# Patient Record
Sex: Male | Born: 1955 | Race: White | Hispanic: No | Marital: Single | Smoking: Never smoker
Health system: Southern US, Community
[De-identification: ages and names within clinical notes are randomized; demographics above are authoritative.]

## PROBLEM LIST (undated history)

## (undated) DIAGNOSIS — I5042 Chronic combined systolic (congestive) and diastolic (congestive) heart failure: Secondary | ICD-10-CM

## (undated) DIAGNOSIS — Z95810 Presence of automatic (implantable) cardiac defibrillator: Secondary | ICD-10-CM

## (undated) DIAGNOSIS — E119 Type 2 diabetes mellitus without complications: Secondary | ICD-10-CM

## (undated) DIAGNOSIS — M6281 Muscle weakness (generalized): Secondary | ICD-10-CM

## (undated) DIAGNOSIS — G8929 Other chronic pain: Secondary | ICD-10-CM

## (undated) DIAGNOSIS — E0781 Sick-euthyroid syndrome: Secondary | ICD-10-CM

## (undated) DIAGNOSIS — E785 Hyperlipidemia, unspecified: Secondary | ICD-10-CM

## (undated) DIAGNOSIS — G629 Polyneuropathy, unspecified: Secondary | ICD-10-CM

## (undated) DIAGNOSIS — G4733 Obstructive sleep apnea (adult) (pediatric): Secondary | ICD-10-CM

## (undated) DIAGNOSIS — I255 Ischemic cardiomyopathy: Secondary | ICD-10-CM

## (undated) DIAGNOSIS — I7090 Unspecified atherosclerosis: Secondary | ICD-10-CM

## (undated) DIAGNOSIS — G2581 Restless legs syndrome: Secondary | ICD-10-CM

## (undated) DIAGNOSIS — R079 Chest pain, unspecified: Secondary | ICD-10-CM

## (undated) DIAGNOSIS — I10 Essential (primary) hypertension: Secondary | ICD-10-CM

## (undated) DIAGNOSIS — I2510 Atherosclerotic heart disease of native coronary artery without angina pectoris: Secondary | ICD-10-CM

## (undated) DIAGNOSIS — K8050 Calculus of bile duct without cholangitis or cholecystitis without obstruction: Secondary | ICD-10-CM

## (undated) DIAGNOSIS — N289 Disorder of kidney and ureter, unspecified: Secondary | ICD-10-CM

## (undated) DIAGNOSIS — F339 Major depressive disorder, recurrent, unspecified: Secondary | ICD-10-CM

## (undated) DIAGNOSIS — I4891 Unspecified atrial fibrillation: Secondary | ICD-10-CM

---

## 2016-12-10 ENCOUNTER — Encounter (HOSPITAL_COMMUNITY): Payer: Self-pay | Admitting: Emergency Medicine

## 2016-12-10 ENCOUNTER — Inpatient Hospital Stay (HOSPITAL_COMMUNITY)
Admission: EM | Admit: 2016-12-10 | Discharge: 2016-12-31 | DRG: 291 | Disposition: A | Discharge status: Skilled Nursing Facility | Payer: Medicaid Other | Attending: Internal Medicine | Admitting: Internal Medicine

## 2016-12-10 DIAGNOSIS — E1165 Type 2 diabetes mellitus with hyperglycemia: Secondary | ICD-10-CM | POA: Diagnosis present

## 2016-12-10 DIAGNOSIS — I447 Left bundle-branch block, unspecified: Secondary | ICD-10-CM | POA: Diagnosis present

## 2016-12-10 DIAGNOSIS — K625 Hemorrhage of anus and rectum: Secondary | ICD-10-CM

## 2016-12-10 DIAGNOSIS — E875 Hyperkalemia: Secondary | ICD-10-CM | POA: Diagnosis not present

## 2016-12-10 DIAGNOSIS — E662 Morbid (severe) obesity with alveolar hypoventilation: Secondary | ICD-10-CM | POA: Diagnosis present

## 2016-12-10 DIAGNOSIS — K1379 Other lesions of oral mucosa: Secondary | ICD-10-CM

## 2016-12-10 DIAGNOSIS — K648 Other hemorrhoids: Secondary | ICD-10-CM | POA: Diagnosis present

## 2016-12-10 DIAGNOSIS — Z9911 Dependence on respirator [ventilator] status: Secondary | ICD-10-CM

## 2016-12-10 DIAGNOSIS — N172 Acute kidney failure with medullary necrosis: Secondary | ICD-10-CM | POA: Diagnosis present

## 2016-12-10 DIAGNOSIS — I5023 Acute on chronic systolic (congestive) heart failure: Secondary | ICD-10-CM | POA: Diagnosis present

## 2016-12-10 DIAGNOSIS — W19XXXA Unspecified fall, initial encounter: Secondary | ICD-10-CM | POA: Diagnosis not present

## 2016-12-10 DIAGNOSIS — Z23 Encounter for immunization: Secondary | ICD-10-CM

## 2016-12-10 DIAGNOSIS — F23 Brief psychotic disorder: Secondary | ICD-10-CM | POA: Diagnosis present

## 2016-12-10 DIAGNOSIS — D125 Benign neoplasm of sigmoid colon: Secondary | ICD-10-CM | POA: Diagnosis present

## 2016-12-10 DIAGNOSIS — Y92239 Unspecified place in hospital as the place of occurrence of the external cause: Secondary | ICD-10-CM | POA: Diagnosis not present

## 2016-12-10 DIAGNOSIS — E1142 Type 2 diabetes mellitus with diabetic polyneuropathy: Secondary | ICD-10-CM | POA: Diagnosis present

## 2016-12-10 DIAGNOSIS — L03116 Cellulitis of left lower limb: Secondary | ICD-10-CM | POA: Diagnosis present

## 2016-12-10 DIAGNOSIS — Z7982 Long term (current) use of aspirin: Secondary | ICD-10-CM

## 2016-12-10 DIAGNOSIS — I4892 Unspecified atrial flutter: Secondary | ICD-10-CM | POA: Diagnosis present

## 2016-12-10 DIAGNOSIS — K635 Polyp of colon: Secondary | ICD-10-CM

## 2016-12-10 DIAGNOSIS — R51 Headache: Secondary | ICD-10-CM | POA: Diagnosis not present

## 2016-12-10 DIAGNOSIS — Z794 Long term (current) use of insulin: Secondary | ICD-10-CM

## 2016-12-10 DIAGNOSIS — R0600 Dyspnea, unspecified: Secondary | ICD-10-CM

## 2016-12-10 DIAGNOSIS — I420 Dilated cardiomyopathy: Secondary | ICD-10-CM | POA: Diagnosis present

## 2016-12-10 DIAGNOSIS — I481 Persistent atrial fibrillation: Secondary | ICD-10-CM | POA: Diagnosis present

## 2016-12-10 DIAGNOSIS — I4891 Unspecified atrial fibrillation: Secondary | ICD-10-CM | POA: Diagnosis present

## 2016-12-10 DIAGNOSIS — S01512A Laceration without foreign body of oral cavity, initial encounter: Secondary | ICD-10-CM | POA: Diagnosis present

## 2016-12-10 DIAGNOSIS — G4733 Obstructive sleep apnea (adult) (pediatric): Secondary | ICD-10-CM | POA: Diagnosis present

## 2016-12-10 DIAGNOSIS — L03115 Cellulitis of right lower limb: Secondary | ICD-10-CM | POA: Diagnosis present

## 2016-12-10 DIAGNOSIS — F333 Major depressive disorder, recurrent, severe with psychotic symptoms: Secondary | ICD-10-CM | POA: Diagnosis present

## 2016-12-10 DIAGNOSIS — I255 Ischemic cardiomyopathy: Secondary | ICD-10-CM | POA: Diagnosis present

## 2016-12-10 DIAGNOSIS — E0781 Sick-euthyroid syndrome: Secondary | ICD-10-CM | POA: Diagnosis present

## 2016-12-10 DIAGNOSIS — E870 Hyperosmolality and hypernatremia: Secondary | ICD-10-CM | POA: Diagnosis present

## 2016-12-10 DIAGNOSIS — I509 Heart failure, unspecified: Secondary | ICD-10-CM

## 2016-12-10 DIAGNOSIS — N183 Chronic kidney disease, stage 3 unspecified: Secondary | ICD-10-CM

## 2016-12-10 DIAGNOSIS — I10 Essential (primary) hypertension: Secondary | ICD-10-CM | POA: Diagnosis present

## 2016-12-10 DIAGNOSIS — I482 Chronic atrial fibrillation, unspecified: Secondary | ICD-10-CM

## 2016-12-10 DIAGNOSIS — I5021 Acute systolic (congestive) heart failure: Secondary | ICD-10-CM

## 2016-12-10 DIAGNOSIS — J9602 Acute respiratory failure with hypercapnia: Secondary | ICD-10-CM | POA: Diagnosis present

## 2016-12-10 DIAGNOSIS — J9601 Acute respiratory failure with hypoxia: Secondary | ICD-10-CM | POA: Diagnosis present

## 2016-12-10 DIAGNOSIS — Z951 Presence of aortocoronary bypass graft: Secondary | ICD-10-CM

## 2016-12-10 DIAGNOSIS — E785 Hyperlipidemia, unspecified: Secondary | ICD-10-CM | POA: Diagnosis present

## 2016-12-10 DIAGNOSIS — R440 Auditory hallucinations: Secondary | ICD-10-CM

## 2016-12-10 DIAGNOSIS — N189 Chronic kidney disease, unspecified: Secondary | ICD-10-CM

## 2016-12-10 DIAGNOSIS — E876 Hypokalemia: Secondary | ICD-10-CM | POA: Diagnosis present

## 2016-12-10 DIAGNOSIS — Z9981 Dependence on supplemental oxygen: Secondary | ICD-10-CM

## 2016-12-10 DIAGNOSIS — E1122 Type 2 diabetes mellitus with diabetic chronic kidney disease: Secondary | ICD-10-CM | POA: Diagnosis present

## 2016-12-10 DIAGNOSIS — E7220 Disorder of urea cycle metabolism, unspecified: Secondary | ICD-10-CM | POA: Diagnosis present

## 2016-12-10 DIAGNOSIS — I130 Hypertensive heart and chronic kidney disease with heart failure and stage 1 through stage 4 chronic kidney disease, or unspecified chronic kidney disease: Principal | ICD-10-CM | POA: Diagnosis present

## 2016-12-10 DIAGNOSIS — S0003XA Contusion of scalp, initial encounter: Secondary | ICD-10-CM | POA: Diagnosis not present

## 2016-12-10 DIAGNOSIS — N179 Acute kidney failure, unspecified: Secondary | ICD-10-CM

## 2016-12-10 DIAGNOSIS — K921 Melena: Secondary | ICD-10-CM | POA: Diagnosis not present

## 2016-12-10 DIAGNOSIS — Z8249 Family history of ischemic heart disease and other diseases of the circulatory system: Secondary | ICD-10-CM

## 2016-12-10 DIAGNOSIS — R42 Dizziness and giddiness: Secondary | ICD-10-CM | POA: Diagnosis not present

## 2016-12-10 DIAGNOSIS — E872 Acidosis: Secondary | ICD-10-CM | POA: Diagnosis present

## 2016-12-10 DIAGNOSIS — Z7901 Long term (current) use of anticoagulants: Secondary | ICD-10-CM

## 2016-12-10 DIAGNOSIS — Z6841 Body Mass Index (BMI) 40.0 and over, adult: Secondary | ICD-10-CM

## 2016-12-10 DIAGNOSIS — Z95810 Presence of automatic (implantable) cardiac defibrillator: Secondary | ICD-10-CM

## 2016-12-10 DIAGNOSIS — E119 Type 2 diabetes mellitus without complications: Secondary | ICD-10-CM

## 2016-12-10 DIAGNOSIS — Z79899 Other long term (current) drug therapy: Secondary | ICD-10-CM

## 2016-12-10 DIAGNOSIS — I2510 Atherosclerotic heart disease of native coronary artery without angina pectoris: Secondary | ICD-10-CM | POA: Diagnosis present

## 2016-12-10 DIAGNOSIS — X58XXXA Exposure to other specified factors, initial encounter: Secondary | ICD-10-CM | POA: Diagnosis present

## 2016-12-10 DIAGNOSIS — G92 Toxic encephalopathy: Secondary | ICD-10-CM | POA: Diagnosis present

## 2016-12-10 DIAGNOSIS — Z955 Presence of coronary angioplasty implant and graft: Secondary | ICD-10-CM

## 2016-12-10 DIAGNOSIS — G2581 Restless legs syndrome: Secondary | ICD-10-CM | POA: Diagnosis present

## 2016-12-10 DIAGNOSIS — K5900 Constipation, unspecified: Secondary | ICD-10-CM | POA: Diagnosis not present

## 2016-12-10 DIAGNOSIS — T4275XA Adverse effect of unspecified antiepileptic and sedative-hypnotic drugs, initial encounter: Secondary | ICD-10-CM | POA: Diagnosis present

## 2016-12-10 DIAGNOSIS — R441 Visual hallucinations: Secondary | ICD-10-CM

## 2016-12-10 DIAGNOSIS — D62 Acute posthemorrhagic anemia: Secondary | ICD-10-CM | POA: Diagnosis present

## 2016-12-10 HISTORY — DX: Presence of automatic (implantable) cardiac defibrillator: Z95.810

## 2016-12-10 HISTORY — DX: Unspecified atrial fibrillation: I48.91

## 2016-12-10 HISTORY — DX: Chronic combined systolic (congestive) and diastolic (congestive) heart failure: I50.42

## 2016-12-10 HISTORY — DX: Essential (primary) hypertension: I10

## 2016-12-10 HISTORY — DX: Hyperlipidemia, unspecified: E78.5

## 2016-12-10 HISTORY — DX: Type 2 diabetes mellitus without complications: E11.9

## 2016-12-10 HISTORY — DX: Ischemic cardiomyopathy: I25.5

## 2016-12-10 HISTORY — DX: Muscle weakness (generalized): M62.81

## 2016-12-10 HISTORY — DX: Atherosclerotic heart disease of native coronary artery without angina pectoris: I25.10

## 2016-12-10 HISTORY — DX: Major depressive disorder, recurrent, unspecified: F33.9

## 2016-12-10 HISTORY — DX: Morbid (severe) obesity due to excess calories: E66.01

## 2016-12-10 HISTORY — DX: Other chronic pain: G89.29

## 2016-12-10 HISTORY — DX: Calculus of bile duct without cholangitis or cholecystitis without obstruction: K80.50

## 2016-12-10 HISTORY — DX: Chest pain, unspecified: R07.9

## 2016-12-10 HISTORY — DX: Obstructive sleep apnea (adult) (pediatric): G47.33

## 2016-12-10 HISTORY — DX: Sick-euthyroid syndrome: E07.81

## 2016-12-10 HISTORY — DX: Restless legs syndrome: G25.81

## 2016-12-10 HISTORY — DX: Polyneuropathy, unspecified: G62.9

## 2016-12-10 HISTORY — DX: Disorder of kidney and ureter, unspecified: N28.9

## 2016-12-10 HISTORY — DX: Unspecified atherosclerosis: I70.90

## 2016-12-10 LAB — ETHANOL: Alcohol, Ethyl (B): <10 mg/dL (ref <10)

## 2016-12-10 LAB — RAPID URINE DRUG SCREEN, HOSP PERFORMED
AMPHETAMINES: NOT DETECTED
BARBITURATES: NOT DETECTED
BENZODIAZEPINES: NOT DETECTED
Cocaine: NOT DETECTED
Opiates: NOT DETECTED
TETRAHYDROCANNABINOL: NOT DETECTED

## 2016-12-10 LAB — CBC WITH DIFFERENTIAL/PLATELET
BASOS PCT: 1 %
Basophils Absolute: 0 10*3/uL (ref 0.0–0.1)
EOS ABS: 0.2 10*3/uL (ref 0.0–0.7)
EOS PCT: 3 %
HCT: 31.5 % — ABNORMAL LOW (ref 39.0–52.0)
Hemoglobin: 9.7 g/dL — ABNORMAL LOW (ref 13.0–17.0)
LYMPHS ABS: 0.7 10*3/uL (ref 0.7–4.0)
Lymphocytes Relative: 10 %
MCH: 29.4 pg (ref 26.0–34.0)
MCHC: 30.8 g/dL (ref 30.0–36.0)
MCV: 95.5 fL (ref 78.0–100.0)
Monocytes Absolute: 0.6 10*3/uL (ref 0.1–1.0)
Monocytes Relative: 10 %
Neutro Abs: 4.9 10*3/uL (ref 1.7–7.7)
Neutrophils Relative %: 76 %
PLATELETS: 234 10*3/uL (ref 150–400)
RBC: 3.3 MIL/uL — AB (ref 4.22–5.81)
RDW: 14.9 % (ref 11.5–15.5)
WBC: 6.4 10*3/uL (ref 4.0–10.5)

## 2016-12-10 LAB — BASIC METABOLIC PANEL
Anion gap: 12 (ref 5–15)
BUN: 34 mg/dL — AB (ref 6–20)
CO2: 37 mmol/L — ABNORMAL HIGH (ref 22–32)
CREATININE: 2.01 mg/dL — AB (ref 0.61–1.24)
Calcium: 8.6 mg/dL — ABNORMAL LOW (ref 8.9–10.3)
Chloride: 88 mmol/L — ABNORMAL LOW (ref 101–111)
GFR calc non Af Amer: 34 mL/min — ABNORMAL LOW (ref >60)
GFR, EST AFRICAN AMERICAN: 39 mL/min — AB (ref >60)
Glucose, Bld: 114 mg/dL — ABNORMAL HIGH (ref 65–99)
Potassium: 3.7 mmol/L (ref 3.5–5.1)
SODIUM: 137 mmol/L (ref 135–145)

## 2016-12-10 MED ORDER — ASPIRIN EC 81 MG PO TBEC
81.0000 mg | DELAYED_RELEASE_TABLET | Freq: Every day | ORAL | Status: DC
  Administered 2016-12-11: 81 mg via ORAL
  Filled 2016-12-10: qty 1

## 2016-12-10 MED ORDER — ISOSORBIDE MONONITRATE ER 60 MG PO TB24
60.0000 mg | ORAL_TABLET | Freq: Every day | ORAL | Status: DC
  Administered 2016-12-11 – 2016-12-14 (×4): 60 mg via ORAL
  Filled 2016-12-10 (×6): qty 1

## 2016-12-10 MED ORDER — AMIODARONE HCL 200 MG PO TABS
200.0000 mg | ORAL_TABLET | Freq: Every day | ORAL | Status: DC
  Administered 2016-12-11 – 2016-12-14 (×4): 200 mg via ORAL
  Filled 2016-12-10 (×5): qty 1

## 2016-12-10 MED ORDER — BUMETANIDE 1 MG PO TABS
2.0000 mg | ORAL_TABLET | Freq: Two times a day (BID) | ORAL | Status: DC
  Administered 2016-12-11 – 2016-12-14 (×7): 2 mg via ORAL
  Filled 2016-12-10 (×3): qty 2
  Filled 2016-12-10 (×2): qty 1
  Filled 2016-12-10 (×3): qty 2
  Filled 2016-12-10: qty 1

## 2016-12-10 MED ORDER — OXYCODONE HCL 5 MG PO CAPS
5.0000 mg | ORAL_CAPSULE | ORAL | Status: DC | PRN
  Filled 2016-12-10: qty 1

## 2016-12-10 MED ORDER — APIXABAN 5 MG PO TABS
5.0000 mg | ORAL_TABLET | Freq: Two times a day (BID) | ORAL | Status: DC
  Administered 2016-12-11 (×2): 5 mg via ORAL
  Filled 2016-12-10 (×3): qty 1

## 2016-12-10 MED ORDER — SPIRONOLACTONE 25 MG PO TABS
12.5000 mg | ORAL_TABLET | Freq: Every day | ORAL | Status: DC
  Administered 2016-12-11 – 2016-12-14 (×4): 12.5 mg via ORAL
  Filled 2016-12-10 (×5): qty 1

## 2016-12-10 MED ORDER — CARVEDILOL 3.125 MG PO TABS
3.1250 mg | ORAL_TABLET | Freq: Two times a day (BID) | ORAL | Status: DC
  Administered 2016-12-11 – 2016-12-14 (×6): 3.125 mg via ORAL
  Filled 2016-12-10 (×10): qty 1

## 2016-12-10 MED ORDER — ATORVASTATIN CALCIUM 40 MG PO TABS
80.0000 mg | ORAL_TABLET | Freq: Every evening | ORAL | Status: DC
  Administered 2016-12-11 – 2016-12-13 (×3): 80 mg via ORAL
  Filled 2016-12-10 (×2): qty 2
  Filled 2016-12-10: qty 1

## 2016-12-10 MED ORDER — DOCUSATE SODIUM 100 MG PO CAPS
100.0000 mg | ORAL_CAPSULE | Freq: Every day | ORAL | Status: DC
  Administered 2016-12-11 – 2016-12-14 (×3): 100 mg via ORAL
  Filled 2016-12-10 (×5): qty 1

## 2016-12-10 MED ORDER — GABAPENTIN 100 MG PO CAPS
100.0000 mg | ORAL_CAPSULE | Freq: Three times a day (TID) | ORAL | Status: DC
  Administered 2016-12-11: 100 mg via ORAL
  Filled 2016-12-10: qty 1

## 2016-12-10 MED ORDER — SULFAMETHOXAZOLE-TRIMETHOPRIM 800-160 MG PO TABS
1.0000 | ORAL_TABLET | Freq: Two times a day (BID) | ORAL | Status: DC
  Administered 2016-12-11 (×2): 1 via ORAL
  Filled 2016-12-10 (×2): qty 1

## 2016-12-10 MED ORDER — ROPINIROLE HCL 1 MG PO TABS
1.0000 mg | ORAL_TABLET | Freq: Every day | ORAL | Status: DC
  Administered 2016-12-11 – 2016-12-13 (×3): 1 mg via ORAL
  Filled 2016-12-10 (×8): qty 1

## 2016-12-10 MED ORDER — ACETAMINOPHEN 325 MG PO TABS: 650.0000 mg | ORAL_TABLET | Freq: Four times a day (QID) | ORAL | Status: DC | PRN

## 2016-12-10 MED ORDER — MAGNESIUM OXIDE 400 MG PO TABS
400.0000 mg | ORAL_TABLET | Freq: Every day | ORAL | Status: DC
  Administered 2016-12-11 – 2016-12-14 (×4): 400 mg via ORAL
  Filled 2016-12-10 (×11): qty 1

## 2016-12-10 MED ORDER — TIOTROPIUM BROMIDE MONOHYDRATE 18 MCG IN CAPS
18.0000 ug | ORAL_CAPSULE | Freq: Every day | RESPIRATORY_TRACT | Status: DC
  Administered 2016-12-11: 18 ug via RESPIRATORY_TRACT
  Filled 2016-12-10: qty 5
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
## 2016-12-10 NOTE — ED Notes (Signed)
Pt states he has been having AVH X1 month, stopped taking antidepressant two days ago because he believed that was the cause. He states he does not have HI/SI thoughts but does not feel safe at SNF. Denies command hallucinations, but states he had "sat in front of 3 doctors today and one of them picked up a square floor tile. When she lifted it up there was a bunch of children, it was really loud, and she got a box from them. It was filled with a bunch of star and football pills.".
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
## 2016-12-10 NOTE — ED Notes (Signed)
Family members to contact with [place], Niece [phone]  Loni Dolly [phone]

## 2016-12-10 NOTE — ED Provider Notes (Signed)
[place] DEPT Provider Note   CSN: [id number] Arrival date & time: 12/10/16  2048     History   Chief Complaint Chief Complaint  Patient presents with  . V70.1    HPI Darrell Black is a 61 y.o. male.  Patient is a 61 year old male with multiple medical problems including diabetes, CHF, atrial fibrillation, ischemic cardiomyopathy, major depression, chronic kidney disease presenting today for visual hallucinations which have worsened over the last 4 days. Patient states that since starting an antidepressant in June he started to have some mild visual hallucinations which included spiders crawling on the wall and curtains moving. It progressed to having people sitting in his room but they were not threatening it did not bother him. He recently started on an antibiotic on Friday and discussed with his doctor about stopping his antidepressant. Since Friday symptoms have significantly worsened. Heis hallucinating constantly, sometimes the divisions are threatening and scary.  She states ordinarily hurt anyone but he is afraid if he is having one of these visions he may have a hard time discerning reality from delusion and could potentially hurt someone. He states he would never hurt himself as his brother committed suicide and he would never do that to his family. He denies any fever, chest pain, shortness of breath. He has chronic swelling in the legs but does not feel that it is any different than normal.   The history is provided by the patient.    Past Medical History:  Diagnosis Date  . Atherosclerosis   . Atrial fibrillation ([place])   . 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 Chest pain   . Chronic combined systolic and diastolic heart failure ([place])   . Chronic pain   . Coronary artery disease   . 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 Hyperlipidemia   . Hypertension    essential  . Ischemic cardiomyopathy   . Major depressive  disorder, recurrent ([place])   . Morbid obesity due to excess calories ([place])   . Muscle weakness (generalized)   . Neuropathy   . Obstructive sleep apnea   . Presence of automatic cardioverter/defibrillator (AICD)   . Renal disorder    Chronic kidney disease  . Restless leg syndrome   . Sick-euthyroid syndrome     There are no active problems to display for this patient.   No past surgical history on file.     Home Medications    Prior to Admission medications   Not on File    Family History No family history on file.  Social History Social History  Substance Use Topics  . Smoking status: Not on file  . Smokeless tobacco: Not on file  . Alcohol use Not on file     Allergies   Ampicillin; Clindamycin/lincomycin; and Penicillins   Review of Systems Review of Systems  Cardiovascular: Positive for leg swelling.  All other systems reviewed and are negative.    Physical Exam Updated Vital Signs BP 99/65 (BP Location: Right Arm)   Pulse 77   Temp 98.4 F (36.9 C) (Tympanic)   Resp 17   SpO2 99%   Physical Exam  Constitutional: He is oriented to person, place, and time. He appears well-developed and well-nourished. No distress.  Morbidly obese  HENT:  Head: Normocephalic and atraumatic.  Mouth/Throat: Oropharynx is clear and moist.  Eyes: Pupils are equal, round, and reactive to light. Conjunctivae and EOM are normal.  Neck: Normal range  of motion. Neck supple.  Cardiovascular: Normal rate and intact distal pulses.  An irregularly irregular rhythm present.  No murmur heard. Pulmonary/Chest: Effort normal and breath sounds normal. No respiratory distress. He has no wheezes. He has no rales.  Abdominal: Soft. He exhibits no distension. There is no tenderness. There is no rebound and no guarding.  Musculoskeletal: Normal range of motion. He exhibits edema. He exhibits no tenderness.  2+ pitting edema in bilateral lower extremities, skin changes consistent with  venous stasis and small areas of blistering consistent with fluid overload  Neurological: He is alert and oriented to person, place, and time.  Skin: Skin is warm and dry. No rash noted. No erythema.  Psychiatric: He has a normal mood and affect. His speech is normal and behavior is normal. He is actively hallucinating. He expresses no homicidal and no suicidal ideation.  Nursing note and vitals reviewed.    ED Treatments / Results  Labs (all labs ordered are listed, but only abnormal results are displayed) Labs Reviewed  CBC WITH DIFFERENTIAL/PLATELET - Abnormal; Notable for the following:       Result Value   RBC 3.30 (*)    Hemoglobin 9.7 (*)    HCT 31.5 (*)    All other components within normal limits  BASIC METABOLIC PANEL - Abnormal; Notable for the following:    Chloride 88 (*)    CO2 37 (*)    Glucose, Bld 114 (*)    BUN 34 (*)    Creatinine, Ser 2.01 (*)    Calcium 8.6 (*)    GFR calc non Af Amer 34 (*)    GFR calc Af Amer 39 (*)    All other components within normal limits  ETHANOL  RAPID URINE DRUG SCREEN, HOSP PERFORMED    EKG  EKG Interpretation None       Radiology No results found.  Procedures Procedures (including critical care time)  Medications Ordered in ED Medications - No data to display   Initial Impression / Assessment and Plan / ED Course  I have reviewed the triage vital signs and the nursing notes.  Pertinent labs & imaging results that were available during my care of the patient were reviewed by me and considered in my medical decision making (see chart for details).     Is a pleasant older gentleman presenting with worsening auditory hallucinations and visual hallucinations. Symptoms seem to worsen since abruptly stopping his antidepressant on Friday. This suspicion that this is delirium or based on a medical problem which he does have a lot of. Vital signs are stable patient is otherwise well-appearing. However he speaks as his  hallucinations which are very difficult and now they are starting to scare him. No suicidal or direct homicidal ideation. He is just afraid of what might happen if he is having one of his hallucinations that he could potentially hurt someone because he does not realize what is real.  No prior lab work in our system however patient has a history of chronic kidney disease her creatinine of 2 is probably around his baseline as well asis anemia which is again most likely from chronic kidney disease.he does not take any mind altering medications and denies any substance abuse. Alcohol level is less than 10. Patient denies any urinary symptoms and low suspicion for UTI at this time. Will have TTS consult for further recommendation.  Final Clinical Impressions(s) / ED Diagnoses   Final diagnoses:  Visual hallucination  Auditory hallucination  New Prescriptions New Prescriptions   No medications on file     Blanchie Dessert, MD 12/10/16 2315

## 2016-12-11 ENCOUNTER — Emergency Department (HOSPITAL_COMMUNITY): Payer: Medicaid Other

## 2016-12-11 LAB — BLOOD GAS, ARTERIAL
Acid-Base Excess: 15.9 mmol/L — ABNORMAL HIGH (ref 0.0–2.0)
Bicarbonate: 37.8 mmol/L — ABNORMAL HIGH (ref 20.0–28.0)
Drawn by: 22223
O2 CONTENT: 4 L/min
O2 Saturation: 85.2 %
PO2 ART: 59.7 mmHg — AB (ref 83.0–108.0)
pCO2 arterial: 84.2 mmHg (ref 32.0–48.0)
pH, Arterial: 7.324 — ABNORMAL LOW (ref 7.350–7.450)

## 2016-12-11 LAB — BASIC METABOLIC PANEL
Anion gap: 10 (ref 5–15)
BUN: 36 mg/dL — AB (ref 6–20)
CALCIUM: 8.5 mg/dL — AB (ref 8.9–10.3)
CO2: 40 mmol/L — ABNORMAL HIGH (ref 22–32)
CREATININE: 2.05 mg/dL — AB (ref 0.61–1.24)
Chloride: 89 mmol/L — ABNORMAL LOW (ref 101–111)
GFR, EST AFRICAN AMERICAN: 39 mL/min — AB (ref >60)
GFR, EST NON AFRICAN AMERICAN: 33 mL/min — AB (ref >60)
Glucose, Bld: 115 mg/dL — ABNORMAL HIGH (ref 65–99)
Potassium: 3.7 mmol/L (ref 3.5–5.1)
SODIUM: 139 mmol/L (ref 135–145)

## 2016-12-11 LAB — CBC WITH DIFFERENTIAL/PLATELET
BASOS ABS: 0.1 10*3/uL (ref 0.0–0.1)
BASOS PCT: 1 %
EOS ABS: 0.1 10*3/uL (ref 0.0–0.7)
EOS PCT: 1 %
HCT: 32.4 % — ABNORMAL LOW (ref 39.0–52.0)
Hemoglobin: 9.7 g/dL — ABNORMAL LOW (ref 13.0–17.0)
Lymphocytes Relative: 9 %
Lymphs Abs: 0.5 10*3/uL — ABNORMAL LOW (ref 0.7–4.0)
MCH: 29.3 pg (ref 26.0–34.0)
MCHC: 29.9 g/dL — ABNORMAL LOW (ref 30.0–36.0)
MCV: 97.9 fL (ref 78.0–100.0)
Monocytes Absolute: 0.6 10*3/uL (ref 0.1–1.0)
Monocytes Relative: 11 %
NEUTROS PCT: 78 %
Neutro Abs: 4.7 10*3/uL (ref 1.7–7.7)
PLATELETS: 225 10*3/uL (ref 150–400)
RBC: 3.31 MIL/uL — AB (ref 4.22–5.81)
RDW: 15.1 % (ref 11.5–15.5)
WBC: 6 10*3/uL (ref 4.0–10.5)

## 2016-12-11 LAB — PROTIME-INR
INR: 1.91
Prothrombin Time: 21.7 seconds — ABNORMAL HIGH (ref 11.4–15.2)

## 2016-12-11 LAB — AMMONIA: Ammonia: 48 umol/L — ABNORMAL HIGH (ref 9–35)

## 2016-12-11 LAB — BRAIN NATRIURETIC PEPTIDE: B NATRIURETIC PEPTIDE 5: 583 pg/mL — AB (ref 0.0–100.0)

## 2016-12-11 MED ORDER — GABAPENTIN 100 MG PO CAPS
100.0000 mg | ORAL_CAPSULE | Freq: Two times a day (BID) | ORAL | Status: DC
  Administered 2016-12-11 – 2016-12-13 (×5): 100 mg via ORAL
  Filled 2016-12-11 (×5): qty 1

## 2016-12-11 MED ORDER — RISPERIDONE 0.5 MG PO TABS
0.5000 mg | ORAL_TABLET | Freq: Every day | ORAL | Status: DC
  Administered 2016-12-11 – 2016-12-13 (×3): 0.5 mg via ORAL
  Filled 2016-12-11 (×5): qty 1

## 2016-12-11 MED ORDER — GABAPENTIN 100 MG PO CAPS
200.0000 mg | ORAL_CAPSULE | Freq: Every day | ORAL | Status: DC
  Administered 2016-12-11 – 2016-12-13 (×3): 200 mg via ORAL
  Filled 2016-12-11 (×3): qty 2

## 2016-12-11 MED ORDER — OXYCODONE HCL 5 MG PO TABS
5.0000 mg | ORAL_TABLET | ORAL | Status: DC | PRN
  Administered 2016-12-12 – 2016-12-13 (×2): 5 mg via ORAL
  Filled 2016-12-11 (×2): qty 1

## 2016-12-11 MED ORDER — IPRATROPIUM-ALBUTEROL 0.5-2.5 (3) MG/3ML IN SOLN
3.0000 mL | Freq: Once | RESPIRATORY_TRACT | Status: AC
  Administered 2016-12-11: 3 mL via RESPIRATORY_TRACT

## 2016-12-11 MED ORDER — BENZTROPINE MESYLATE 1 MG PO TABS
0.5000 mg | ORAL_TABLET | Freq: Two times a day (BID) | ORAL | Status: DC
  Administered 2016-12-11 – 2016-12-13 (×5): 0.5 mg via ORAL
  Filled 2016-12-11 (×5): qty 1

## 2016-12-11 MED ORDER — LORAZEPAM 1 MG PO TABS
1.0000 mg | ORAL_TABLET | Freq: Once | ORAL | Status: AC
  Administered 2016-12-11: 1 mg via ORAL
  Filled 2016-12-11: qty 1

## 2016-12-11 MED ORDER — HALOPERIDOL 5 MG PO TABS
5.0000 mg | ORAL_TABLET | Freq: Once | ORAL | Status: AC
  Administered 2016-12-11: 5 mg via ORAL
  Filled 2016-12-11: qty 1

## 2016-12-11 MED ORDER — IPRATROPIUM-ALBUTEROL 0.5-2.5 (3) MG/3ML IN SOLN
3.0000 mL | Freq: Once | RESPIRATORY_TRACT | Status: DC
  Filled 2016-12-11: qty 3

## 2016-12-11 MED ORDER — LORAZEPAM 1 MG PO TABS
1.0000 mg | ORAL_TABLET | Freq: Four times a day (QID) | ORAL | Status: DC | PRN
  Administered 2016-12-11: 1 mg via ORAL
  Filled 2016-12-11: qty 1

## 2016-12-11 MED ORDER — HYDROXYZINE HCL 25 MG PO TABS
25.0000 mg | ORAL_TABLET | Freq: Three times a day (TID) | ORAL | Status: DC | PRN
  Administered 2016-12-12 – 2016-12-13 (×2): 25 mg via ORAL
  Filled 2016-12-11 (×2): qty 1

## 2016-12-11 NOTE — ED Notes (Signed)
Pt came out into the hallway stating he did not feel safe in the room. He sat in the sitter chair and refused to get up. Security was called and pt was escorted back to the room. EDP notified.

## 2016-12-11 NOTE — ED Notes (Signed)
Assisted pt in eating breakfast.

## 2016-12-11 NOTE — Progress Notes (Signed)
Medication recommendation request reviewed and completed.  Plan: -Increase Gabapentin from 100 mg TID to 100 mg BID and 200 mg QHS for nerve/impulse control -Start Risperdal 0.5 mg QHS for mood control -Start Vistaril 25 mg PO TID PRN for anxiety/agitation -Start Cogentin 0.5 mg po BID for EPS control -Continue Requip 1 mg without change -Continue Lorazepam 1 mg Q6h for anxiety   Signed: Justina A. Lu Duffel NP

## 2016-12-11 NOTE — ED Provider Notes (Signed)
Pt resting comfortably BP (!) 121/94   Pulse 71   Temp 98.4 F (36.9 C) (Tympanic)   Resp 18   SpO2 96%  Will need to restart home meds once he is awake Awaiting placement    Ripley Fraise, MD 12/11/16 [phone]

## 2016-12-11 NOTE — ED Notes (Signed)
Darrell Black call stating that she wanted an FL2 form filled out and her family member transferred to [facility]  [facility], spoke with Darrell Black and advised her that the patient could only be released to [facility] and once his crisis has been worked out, she can have his primary care provider or behavioral health fill out the [facility] and have him placed near her.

## 2016-12-11 NOTE — ED Notes (Signed)
Pt eating meal tray 

## 2016-12-11 NOTE — ED Notes (Addendum)
Patient transported to x-ray. ?

## 2016-12-11 NOTE — ED Provider Notes (Signed)
Patient is becoming more belligerent, acting out and demanding to speak to police IVC has been completed  CRITICAL CARE Performed by: Sharyon Cable Total critical care time: 30 minutes Critical care time was exclusive of separately billable procedures and treating other patients. Critical care was necessary to treat or prevent imminent or life-threatening deterioration. Critical care was time spent personally by me on the following activities: development of treatment plan with patient and/or surrogate as well as nursing, discussions with consultants, evaluation of patient's response to treatment, examination of patient, obtaining history from patient or surrogate, ordering and performing treatments and interventions, ordering and review of laboratory studies, ordering and review of radiographic studies, pulse oximetry and re-evaluation of patient's condition.    Ripley Fraise, MD 12/11/16 [phone]

## 2016-12-11 NOTE — ED Notes (Signed)
Provided pt with meal tray, pt states he is "superman" and "superman doesn't need to eat".

## 2016-12-11 NOTE — Progress Notes (Signed)
Patient meets criteria for inpatient treatment. CSW faxed referrals to the following inpatient facilities for review:  [facility], [facility], [facility], [facility]   TTS will continue to seek bed placement.   Radonna Ricker MSW, [place] Disposition [phone]

## 2016-12-11 NOTE — ED Notes (Signed)
Pt is soundly sleeping at this time

## 2016-12-11 NOTE — ED Notes (Signed)
Date and time results received: 12/11/16   2340 (use smartphrase ".now" to insert current time)  Test: PCO2  Critical Value: 84.2  Name of Provider Notified: [place]  Orders Received? Or Actions Taken?:none

## 2016-12-11 NOTE — ED Provider Notes (Signed)
Pt awaiting psych admit He is becoming more belligerent and is clearly hallucinating Currently he is sitting in a chair Will need to order medications for him/for safety  Sitter at bedside   Ripley Fraise, MD 12/11/16 [phone]

## 2016-12-11 NOTE — ED Notes (Signed)
Pt.'s niece wants pt to be placed in [place]. Pt's niece does not want to be discharged to the [facility]. Have transferred niece to

## 2016-12-11 NOTE — ED Notes (Signed)
Pt's O2 sats started dropping to 84-86%. Pt was lying back in bed and asleep. Sat pt straight up in bed and turn O2 up to 4L. Patients O2 sats now up to 92-93%

## 2016-12-11 NOTE — ED Notes (Signed)
Upon entering room and introducing self to pt, pt states that he feels like something fishy is going on and doesn't feel safe here. Explained to pt that this is a hospital and we are here to take care of him. Pt is now resting in bed but appears to be talking to people who aren't there.

## 2016-12-11 NOTE — ED Notes (Signed)
Dr. Lacinda Axon assessed pt and stated he would looked further into improving his breathing

## 2016-12-11 NOTE — ED Provider Notes (Signed)
EKG Interpretation  Date/Time:  Tuesday December 11 2016 04:43:06 EDT Ventricular Rate:  91 PR Interval:    QRS Duration: 144 QT Interval:  472 QTC Calculation: 580 R Axis:   89 Text Interpretation:  Atrial fibrillation with occasional ventricular-paced complexes Non-specific intra-ventricular conduction block Cannot rule out Anteroseptal infarct , age undetermined Abnormal ECG No previous ECGs available Confirmed by Ripley Fraise [phone]) on 12/11/2016 4:48:01 AM      Pt is chronically ill but refusing meds He will be encouraged to take his anticoagulants due to h/o atrial fibrillation Will attempt to restart his home meds    Ripley Fraise, MD 12/11/16 [phone]

## 2016-12-11 NOTE — ED Notes (Signed)
Pt has been seeing people that are not there and conversing with them all night. Pt wants to have police here with him 90-3 for security purposes. Pt states he does not feel safe here.

## 2016-12-11 NOTE — ED Provider Notes (Signed)
Blood pressure (!) 105/51, pulse 100, temperature 98.4 F (36.9 C), temperature source Tympanic, resp. rate 17, SpO2 91 %.  Assuming care from Dr. Lacinda Axon.  In short, Darrell Black is a 61 y.o. male with a chief complaint of V70.1 .  Refer to the original H&P for additional details.   05:25 PM Called to patient bedside for reassessment. The patient is waiting for bed placement through TTS. Patient is able to be aroused to loud voice and repositioning. He has been oozing blood from his mouth. Upon further examination seems to be small cut on the inside of his cheek. No airway compromise at this time. I have reviewed his ED course to this point and feel he may benefit from repeat lab work.   Repeat labs unchanged. Spoke with family member, niece, by phone to update on status of looking for bed placement. Wants placement in [place]. I directed her to the Education officer, museum and psychiatry team.   Nanda Quinton, MD   Margette Fast, MD 12/11/16 2013

## 2016-12-12 ENCOUNTER — Inpatient Hospital Stay (HOSPITAL_COMMUNITY): Payer: Medicaid Other

## 2016-12-12 ENCOUNTER — Encounter (HOSPITAL_COMMUNITY): Payer: Self-pay | Admitting: Internal Medicine

## 2016-12-12 DIAGNOSIS — E785 Hyperlipidemia, unspecified: Secondary | ICD-10-CM | POA: Diagnosis present

## 2016-12-12 DIAGNOSIS — E872 Acidosis: Secondary | ICD-10-CM | POA: Diagnosis present

## 2016-12-12 DIAGNOSIS — R440 Auditory hallucinations: Secondary | ICD-10-CM | POA: Diagnosis not present

## 2016-12-12 DIAGNOSIS — D649 Anemia, unspecified: Secondary | ICD-10-CM | POA: Diagnosis not present

## 2016-12-12 DIAGNOSIS — I10 Essential (primary) hypertension: Secondary | ICD-10-CM

## 2016-12-12 DIAGNOSIS — N189 Chronic kidney disease, unspecified: Secondary | ICD-10-CM | POA: Diagnosis not present

## 2016-12-12 DIAGNOSIS — J9621 Acute and chronic respiratory failure with hypoxia: Secondary | ICD-10-CM | POA: Diagnosis not present

## 2016-12-12 DIAGNOSIS — E1122 Type 2 diabetes mellitus with diabetic chronic kidney disease: Secondary | ICD-10-CM | POA: Diagnosis present

## 2016-12-12 DIAGNOSIS — I5043 Acute on chronic combined systolic (congestive) and diastolic (congestive) heart failure: Secondary | ICD-10-CM | POA: Diagnosis not present

## 2016-12-12 DIAGNOSIS — G2581 Restless legs syndrome: Secondary | ICD-10-CM | POA: Diagnosis present

## 2016-12-12 DIAGNOSIS — N172 Acute kidney failure with medullary necrosis: Secondary | ICD-10-CM | POA: Diagnosis present

## 2016-12-12 DIAGNOSIS — I5023 Acute on chronic systolic (congestive) heart failure: Secondary | ICD-10-CM | POA: Diagnosis present

## 2016-12-12 DIAGNOSIS — E7220 Disorder of urea cycle metabolism, unspecified: Secondary | ICD-10-CM

## 2016-12-12 DIAGNOSIS — Z6841 Body Mass Index (BMI) 40.0 and over, adult: Secondary | ICD-10-CM | POA: Diagnosis not present

## 2016-12-12 DIAGNOSIS — I4891 Unspecified atrial fibrillation: Secondary | ICD-10-CM | POA: Diagnosis not present

## 2016-12-12 DIAGNOSIS — I480 Paroxysmal atrial fibrillation: Secondary | ICD-10-CM | POA: Diagnosis not present

## 2016-12-12 DIAGNOSIS — I255 Ischemic cardiomyopathy: Secondary | ICD-10-CM | POA: Diagnosis not present

## 2016-12-12 DIAGNOSIS — I25119 Atherosclerotic heart disease of native coronary artery with unspecified angina pectoris: Secondary | ICD-10-CM | POA: Diagnosis not present

## 2016-12-12 DIAGNOSIS — K625 Hemorrhage of anus and rectum: Secondary | ICD-10-CM | POA: Diagnosis not present

## 2016-12-12 DIAGNOSIS — J9622 Acute and chronic respiratory failure with hypercapnia: Secondary | ICD-10-CM | POA: Diagnosis not present

## 2016-12-12 DIAGNOSIS — I130 Hypertensive heart and chronic kidney disease with heart failure and stage 1 through stage 4 chronic kidney disease, or unspecified chronic kidney disease: Secondary | ICD-10-CM | POA: Diagnosis present

## 2016-12-12 DIAGNOSIS — I5042 Chronic combined systolic (congestive) and diastolic (congestive) heart failure: Secondary | ICD-10-CM | POA: Insufficient documentation

## 2016-12-12 DIAGNOSIS — J9601 Acute respiratory failure with hypoxia: Secondary | ICD-10-CM | POA: Diagnosis present

## 2016-12-12 DIAGNOSIS — I2510 Atherosclerotic heart disease of native coronary artery without angina pectoris: Secondary | ICD-10-CM | POA: Diagnosis present

## 2016-12-12 DIAGNOSIS — E119 Type 2 diabetes mellitus without complications: Secondary | ICD-10-CM | POA: Diagnosis not present

## 2016-12-12 DIAGNOSIS — G92 Toxic encephalopathy: Secondary | ICD-10-CM | POA: Diagnosis present

## 2016-12-12 DIAGNOSIS — Z95810 Presence of automatic (implantable) cardiac defibrillator: Secondary | ICD-10-CM | POA: Diagnosis not present

## 2016-12-12 DIAGNOSIS — X58XXXA Exposure to other specified factors, initial encounter: Secondary | ICD-10-CM | POA: Diagnosis present

## 2016-12-12 DIAGNOSIS — K921 Melena: Secondary | ICD-10-CM | POA: Diagnosis not present

## 2016-12-12 DIAGNOSIS — R0689 Other abnormalities of breathing: Secondary | ICD-10-CM | POA: Diagnosis present

## 2016-12-12 DIAGNOSIS — G4733 Obstructive sleep apnea (adult) (pediatric): Secondary | ICD-10-CM | POA: Diagnosis present

## 2016-12-12 DIAGNOSIS — K922 Gastrointestinal hemorrhage, unspecified: Secondary | ICD-10-CM | POA: Diagnosis not present

## 2016-12-12 DIAGNOSIS — F23 Brief psychotic disorder: Secondary | ICD-10-CM | POA: Diagnosis not present

## 2016-12-12 DIAGNOSIS — Y92239 Unspecified place in hospital as the place of occurrence of the external cause: Secondary | ICD-10-CM | POA: Diagnosis not present

## 2016-12-12 DIAGNOSIS — D125 Benign neoplasm of sigmoid colon: Secondary | ICD-10-CM | POA: Diagnosis present

## 2016-12-12 DIAGNOSIS — F333 Major depressive disorder, recurrent, severe with psychotic symptoms: Secondary | ICD-10-CM | POA: Diagnosis present

## 2016-12-12 DIAGNOSIS — K648 Other hemorrhoids: Secondary | ICD-10-CM | POA: Diagnosis not present

## 2016-12-12 DIAGNOSIS — R441 Visual hallucinations: Secondary | ICD-10-CM | POA: Diagnosis not present

## 2016-12-12 DIAGNOSIS — F332 Major depressive disorder, recurrent severe without psychotic features: Secondary | ICD-10-CM | POA: Diagnosis not present

## 2016-12-12 DIAGNOSIS — N289 Disorder of kidney and ureter, unspecified: Secondary | ICD-10-CM | POA: Diagnosis not present

## 2016-12-12 DIAGNOSIS — E870 Hyperosmolality and hypernatremia: Secondary | ICD-10-CM | POA: Diagnosis present

## 2016-12-12 DIAGNOSIS — W19XXXA Unspecified fall, initial encounter: Secondary | ICD-10-CM | POA: Diagnosis not present

## 2016-12-12 DIAGNOSIS — I25708 Atherosclerosis of coronary artery bypass graft(s), unspecified, with other forms of angina pectoris: Secondary | ICD-10-CM | POA: Diagnosis not present

## 2016-12-12 DIAGNOSIS — D62 Acute posthemorrhagic anemia: Secondary | ICD-10-CM | POA: Diagnosis present

## 2016-12-12 DIAGNOSIS — Z9989 Dependence on other enabling machines and devices: Secondary | ICD-10-CM | POA: Diagnosis not present

## 2016-12-12 DIAGNOSIS — N183 Chronic kidney disease, stage 3 (moderate): Secondary | ICD-10-CM | POA: Diagnosis present

## 2016-12-12 DIAGNOSIS — I481 Persistent atrial fibrillation: Secondary | ICD-10-CM | POA: Diagnosis present

## 2016-12-12 DIAGNOSIS — K1379 Other lesions of oral mucosa: Secondary | ICD-10-CM | POA: Diagnosis not present

## 2016-12-12 DIAGNOSIS — E1165 Type 2 diabetes mellitus with hyperglycemia: Secondary | ICD-10-CM | POA: Diagnosis present

## 2016-12-12 DIAGNOSIS — J9602 Acute respiratory failure with hypercapnia: Secondary | ICD-10-CM

## 2016-12-12 DIAGNOSIS — L03116 Cellulitis of left lower limb: Secondary | ICD-10-CM | POA: Diagnosis present

## 2016-12-12 DIAGNOSIS — I361 Nonrheumatic tricuspid (valve) insufficiency: Secondary | ICD-10-CM | POA: Diagnosis not present

## 2016-12-12 DIAGNOSIS — N179 Acute kidney failure, unspecified: Secondary | ICD-10-CM | POA: Diagnosis not present

## 2016-12-12 DIAGNOSIS — R4182 Altered mental status, unspecified: Secondary | ICD-10-CM | POA: Diagnosis not present

## 2016-12-12 DIAGNOSIS — I482 Chronic atrial fibrillation: Secondary | ICD-10-CM | POA: Diagnosis not present

## 2016-12-12 DIAGNOSIS — L03115 Cellulitis of right lower limb: Secondary | ICD-10-CM | POA: Diagnosis present

## 2016-12-12 DIAGNOSIS — I5022 Chronic systolic (congestive) heart failure: Secondary | ICD-10-CM | POA: Diagnosis not present

## 2016-12-12 DIAGNOSIS — E662 Morbid (severe) obesity with alveolar hypoventilation: Secondary | ICD-10-CM | POA: Diagnosis present

## 2016-12-12 DIAGNOSIS — I4892 Unspecified atrial flutter: Secondary | ICD-10-CM | POA: Diagnosis present

## 2016-12-12 DIAGNOSIS — Z23 Encounter for immunization: Secondary | ICD-10-CM | POA: Diagnosis not present

## 2016-12-12 DIAGNOSIS — I5021 Acute systolic (congestive) heart failure: Secondary | ICD-10-CM | POA: Diagnosis not present

## 2016-12-12 LAB — ECHOCARDIOGRAM COMPLETE
AVLVOTPG: 3 mmHg
CHL CUP MV DEC (S): 190
CHL CUP RV SYS PRESS: 30 mmHg
EWDT: 190 ms
FS: 23 % — AB (ref 28–44)
Height: 68 in
IV/PV OW: 1.22
LA ID, A-P, ES: 59 mm
LA diam end sys: 59 mm
LA vol A4C: 105 ml
LA vol index: 39.4 mL/m2
LA vol: 96.3 mL
LADIAMINDEX: 2.41 cm/m2
LDCA: 2.84 cm2
LVOT SV: 47 mL
LVOT VTI: 16.4 cm
LVOT diameter: 19 mm
LVOTPV: 83.4 cm/s
MV Peak grad: 7 mmHg
MV pk E vel: 128 m/s
PW: 10.2 mm — AB (ref 0.6–1.1)
Reg peak vel: 191 cm/s
TAPSE: 14 mm
TRMAXVEL: 191 cm/s
Weight: 4208 oz

## 2016-12-12 LAB — BLOOD GAS, ARTERIAL
ACID-BASE EXCESS: 15.9 mmol/L — AB (ref 0.0–2.0)
Bicarbonate: 37.3 mmol/L — ABNORMAL HIGH (ref 20.0–28.0)
DRAWN BY: 277331
FIO2: 1
O2 SAT: 73 %
PATIENT TEMPERATURE: 37
PCO2 ART: 85 mmHg — AB (ref 32.0–48.0)
pH, Arterial: 7.32 — ABNORMAL LOW (ref 7.350–7.450)
pO2, Arterial: 46.3 mmHg — ABNORMAL LOW (ref 83.0–108.0)

## 2016-12-12 LAB — HEMOGLOBIN A1C
Hgb A1c MFr Bld: 5.4 % (ref 4.8–5.6)
MEAN PLASMA GLUCOSE: 108.28 mg/dL

## 2016-12-12 LAB — GLUCOSE, CAPILLARY
GLUCOSE-CAPILLARY: 106 mg/dL — AB (ref 65–99)
Glucose-Capillary: 100 mg/dL — ABNORMAL HIGH (ref 65–99)
Glucose-Capillary: 131 mg/dL — ABNORMAL HIGH (ref 65–99)
Glucose-Capillary: 115 mg/dL — ABNORMAL HIGH (ref 65–99)

## 2016-12-12 LAB — COMPREHENSIVE METABOLIC PANEL
ALBUMIN: 3.7 g/dL (ref 3.5–5.0)
ALT: 23 U/L (ref 17–63)
ANION GAP: 7 (ref 5–15)
AST: 29 U/L (ref 15–41)
Alkaline Phosphatase: 149 U/L — ABNORMAL HIGH (ref 38–126)
BUN: 35 mg/dL — ABNORMAL HIGH (ref 6–20)
CALCIUM: 8.5 mg/dL — AB (ref 8.9–10.3)
CHLORIDE: 90 mmol/L — AB (ref 101–111)
CO2: 42 mmol/L — AB (ref 22–32)
Creatinine, Ser: 1.9 mg/dL — ABNORMAL HIGH (ref 0.61–1.24)
GFR calc non Af Amer: 36 mL/min — ABNORMAL LOW (ref >60)
GFR, EST AFRICAN AMERICAN: 42 mL/min — AB (ref >60)
GLUCOSE: 105 mg/dL — AB (ref 65–99)
POTASSIUM: 3.7 mmol/L (ref 3.5–5.1)
SODIUM: 139 mmol/L (ref 135–145)
Total Bilirubin: 1.4 mg/dL — ABNORMAL HIGH (ref 0.3–1.2)
Total Protein: 7.2 g/dL (ref 6.5–8.1)

## 2016-12-12 LAB — CBC WITH DIFFERENTIAL/PLATELET
BASOS PCT: 0 %
Basophils Absolute: 0 10*3/uL (ref 0.0–0.1)
EOS ABS: 0.1 10*3/uL (ref 0.0–0.7)
EOS PCT: 1 %
HCT: 33.2 % — ABNORMAL LOW (ref 39.0–52.0)
Hemoglobin: 10 g/dL — ABNORMAL LOW (ref 13.0–17.0)
LYMPHS ABS: 0.7 10*3/uL (ref 0.7–4.0)
Lymphocytes Relative: 9 %
MCH: 29.7 pg (ref 26.0–34.0)
MCHC: 30.1 g/dL (ref 30.0–36.0)
MCV: 98.5 fL (ref 78.0–100.0)
MONOS PCT: 5 %
Monocytes Absolute: 0.4 10*3/uL (ref 0.1–1.0)
NEUTROS PCT: 85 %
Neutro Abs: 6.9 10*3/uL (ref 1.7–7.7)
PLATELETS: 221 10*3/uL (ref 150–400)
RBC: 3.37 MIL/uL — ABNORMAL LOW (ref 4.22–5.81)
RDW: 15.1 % (ref 11.5–15.5)
WBC: 8.1 10*3/uL (ref 4.0–10.5)

## 2016-12-12 LAB — MAGNESIUM: MAGNESIUM: 1.5 mg/dL — AB (ref 1.7–2.4)

## 2016-12-12 LAB — PHOSPHORUS: Phosphorus: 4.4 mg/dL (ref 2.5–4.6)

## 2016-12-12 LAB — HEMOGLOBIN AND HEMATOCRIT, BLOOD
HCT: 32.2 % — ABNORMAL LOW (ref 39.0–52.0)
Hemoglobin: 9.8 g/dL — ABNORMAL LOW (ref 13.0–17.0)

## 2016-12-12 LAB — MRSA PCR SCREENING: MRSA by PCR: NEGATIVE

## 2016-12-12 MED ORDER — LORAZEPAM 2 MG/ML IJ SOLN
1.0000 mg | Freq: Four times a day (QID) | INTRAMUSCULAR | Status: DC | PRN
  Administered 2016-12-12 – 2016-12-13 (×3): 1 mg via INTRAVENOUS
  Filled 2016-12-12 (×3): qty 1

## 2016-12-12 MED ORDER — ACETAZOLAMIDE ER 500 MG PO CP12
500.0000 mg | ORAL_CAPSULE | Freq: Two times a day (BID) | ORAL | Status: DC
  Filled 2016-12-12: qty 1

## 2016-12-12 MED ORDER — FUROSEMIDE 10 MG/ML IJ SOLN
80.0000 mg | Freq: Once | INTRAMUSCULAR | Status: AC
  Administered 2016-12-12: 80 mg via INTRAVENOUS
  Filled 2016-12-12: qty 8

## 2016-12-12 MED ORDER — LACTULOSE 10 GM/15ML PO SOLN
20.0000 g | Freq: Two times a day (BID) | ORAL | Status: DC
  Administered 2016-12-12 – 2016-12-14 (×5): 20 g via ORAL
  Filled 2016-12-12 (×7): qty 30

## 2016-12-12 MED ORDER — TRANEXAMIC ACID 1000 MG/10ML IV SOLN
INTRAVENOUS | Status: AC
  Filled 2016-12-12: qty 10

## 2016-12-12 MED ORDER — ALBUTEROL SULFATE (2.5 MG/3ML) 0.083% IN NEBU
2.5000 mg | INHALATION_SOLUTION | RESPIRATORY_TRACT | Status: DC | PRN
  Administered 2016-12-25 – 2016-12-30 (×8): 2.5 mg via RESPIRATORY_TRACT
  Filled 2016-12-12 (×9): qty 3

## 2016-12-12 MED ORDER — IPRATROPIUM BROMIDE 0.02 % IN SOLN: 0.5000 mg | Freq: Four times a day (QID) | RESPIRATORY_TRACT | Status: DC

## 2016-12-12 MED ORDER — "TRANEXAMIC ACID 5% ORAL SOLUTION "
10.0000 mL | Freq: Once | ORAL | Status: AC
  Administered 2016-12-12: 10 mL via OROMUCOSAL
  Filled 2016-12-12: qty 10

## 2016-12-12 MED ORDER — NITROGLYCERIN 0.4 MG SL SUBL: 0.4000 mg | SUBLINGUAL_TABLET | SUBLINGUAL | Status: DC | PRN

## 2016-12-12 MED ORDER — INSULIN ASPART 100 UNIT/ML ~~LOC~~ SOLN
0.0000 [IU] | Freq: Three times a day (TID) | SUBCUTANEOUS | Status: DC
  Administered 2016-12-12: 3 [IU] via SUBCUTANEOUS

## 2016-12-12 MED ORDER — APIXABAN 5 MG PO TABS: 5.0000 mg | ORAL_TABLET | Freq: Two times a day (BID) | ORAL | Status: DC

## 2016-12-12 MED ORDER — ONDANSETRON HCL 4 MG/2ML IJ SOLN: 4.0000 mg | Freq: Four times a day (QID) | INTRAMUSCULAR | Status: DC | PRN

## 2016-12-12 MED ORDER — ASPIRIN 81 MG PO CHEW: 81.0000 mg | CHEWABLE_TABLET | Freq: Every day | ORAL | Status: DC

## 2016-12-12 MED ORDER — FUROSEMIDE 10 MG/ML IJ SOLN
40.0000 mg | Freq: Once | INTRAMUSCULAR | Status: AC
Start: 2016-12-12 — End: 2016-12-12
  Administered 2016-12-12: 40 mg via INTRAVENOUS
  Filled 2016-12-12: qty 4

## 2016-12-12 MED ORDER — INFLUENZA VAC SPLIT QUAD 0.5 ML IM SUSY
0.5000 mL | PREFILLED_SYRINGE | INTRAMUSCULAR | Status: AC
  Administered 2016-12-13: 0.5 mL via INTRAMUSCULAR
  Filled 2016-12-12: qty 0.5

## 2016-12-12 MED ORDER — PERFLUTREN LIPID MICROSPHERE
1.0000 mL | INTRAVENOUS | Status: AC | PRN
  Administered 2016-12-12: 2 mL via INTRAVENOUS
  Administered 2016-12-12: 1 mL via INTRAVENOUS
  Filled 2016-12-12: qty 10

## 2016-12-12 MED ORDER — IPRATROPIUM-ALBUTEROL 0.5-2.5 (3) MG/3ML IN SOLN
2.5000 mg | Freq: Four times a day (QID) | RESPIRATORY_TRACT | Status: DC
  Administered 2016-12-12 – 2016-12-14 (×8): 2.5 mg via RESPIRATORY_TRACT
  Administered 2016-12-14: 3 mg via RESPIRATORY_TRACT
  Administered 2016-12-14 – 2016-12-15 (×2): 2.5 mg via RESPIRATORY_TRACT
  Administered 2016-12-15: 3 mg via RESPIRATORY_TRACT
  Administered 2016-12-15 – 2016-12-16 (×3): 2.5 mg via RESPIRATORY_TRACT
  Administered 2016-12-16: 3 mg via RESPIRATORY_TRACT
  Filled 2016-12-12 (×17): qty 3

## 2016-12-12 MED ORDER — ONDANSETRON HCL 4 MG PO TABS: 4.0000 mg | ORAL_TABLET | Freq: Four times a day (QID) | ORAL | Status: DC | PRN

## 2016-12-12 MED ORDER — MAGNESIUM SULFATE 2 GM/50ML IV SOLN
2.0000 g | Freq: Once | INTRAVENOUS | Status: AC
  Administered 2016-12-12: 2 g via INTRAVENOUS
  Filled 2016-12-12: qty 50

## 2016-12-12 MED ORDER — ALBUTEROL SULFATE (2.5 MG/3ML) 0.083% IN NEBU
5.0000 mg | INHALATION_SOLUTION | Freq: Once | RESPIRATORY_TRACT | Status: AC
  Administered 2016-12-12: 5 mg via RESPIRATORY_TRACT
  Filled 2016-12-12: qty 6

## 2016-12-12 MED ORDER — LORAZEPAM 2 MG/ML IJ SOLN
1.0000 mg | INTRAMUSCULAR | Status: DC | PRN
  Administered 2016-12-12: 1 mg via INTRAVENOUS
  Filled 2016-12-12: qty 1

## 2016-12-12 MED ORDER — IPRATROPIUM-ALBUTEROL 0.5-2.5 (3) MG/3ML IN SOLN
3.0000 mL | Freq: Once | RESPIRATORY_TRACT | Status: AC
  Administered 2016-12-12: 3 mL via RESPIRATORY_TRACT
  Filled 2016-12-12: qty 3

## 2016-12-12 NOTE — H&P (Signed)
History and Physical    Darrell Black EPP:[id number] DOB: 06/24/1955 DOA: 12/10/2016  PCP: Renata Caprice, DO   Patient coming from: Home.  I have personally briefly reviewed patient's old medical records in [place]  Chief Complaint: Shortness of breath.  HPI: Darrell Black is a 61 y.o. male with medical history significant of atherosclerosis, atrial fibrillation, chest pain, chronic combined systolic and diastolic heart failure, chronic pain, coronary artery disease, ischemic cardiomyopathy, type 2 diabetes, hyperlipidemia, hypertension, depression, generalized muscle weakness, morbid obesity, peripheral neuropathy, obstructive sleep apnea, chronic kidney disease, restless leg syndrome, sick euthyroid syndrome who was brought to the emergency department yesterday for progressively worse visual hallucinations for 4 days. He stated yesterday to Dr. Maryan Rued that since he was started on an antidepressant in June he has begun to have mild visual hallucinations, including spiders crawling on the wall and curtains. He has progressed to the point that he thought that people were sitting in his room. He didn't feel threatened or bothered by them. His antidepressant was stopped Friday, but his symptoms have significantly worsens. He mentioned yesterday that he was hallucinating constantly and is afraid that he may not be discerning reality from delusion and could potentially hurt someone.   He was awaiting for transfer to a psychiatric facility, when last evening he began to have worsening dyspnea and seems to be sedated from his psychotropic medications. He has been having persistent bleeding from his mouth from multiple small lacerations and his oral mucosa since earlier. He was given swish and spit tranexamic acid, which has partially decrease his bleeding. However, he has required frequent suctioning to avoid aspiration. This has prevented the use of BiPAP ventilation, however he is satting well with  supplemental oxygen via nasal cannula or mask.  ED Course: The patient was awaiting transfer to psych facility, then became dyspneic with cough, wheezing and rhonchi. He was given supplemental oxygen, furosemide 40 mg IVP and bronchodilators by Dr. Christy Gentles. BiPAP was initially consider after the oral bleeding momentarily subsided after he was given the tranexamic acid swish and spit. However, he was discontinue after the bleeding, although less than he was earlier has persisted.   Workup done yesterday evening and earlier today show an ABG with pH of 7.32, PCO2 of 84, PCO2 of 60 and bicarbonate of 37.8. Stat CBC was 6.0, hemoglobin 9.7 g/dL, which is similar to previous measurement, platelets 225. PT was 21.7 seconds and INR was 1.91. Sodium 139, potassium 3.7, chloride 89, bicarbonate 40 mmol/L. He is ammonia level is 48 umol/L. chest radiograph shows cardiomegaly with vascular congestion.  Review of Systems: Unable to obtain at this time due to somnolent status.  Past Medical History:  Diagnosis Date  . Atherosclerosis   . Atrial fibrillation ([place])   . 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 Chest pain   . Chronic combined systolic and diastolic heart failure ([place])   . Chronic pain   . Coronary artery disease   . Diabetes mellitus without complication ([place])    type two  . Hyperlipidemia   . Hypertension    essential  . Ischemic cardiomyopathy   . Major depressive disorder, recurrent ([place])   . Morbid obesity due to excess calories ([place])   . Muscle weakness (generalized)   . Neuropathy   . Obstructive sleep apnea   . Presence of automatic cardioverter/defibrillator (AICD)   . Renal disorder    Chronic kidney disease  . Restless leg syndrome   .  Sick-euthyroid syndrome     History reviewed. No pertinent surgical history.   has no tobacco, alcohol, and drug history on file.  Allergies  Allergen Reactions  . Ampicillin Rash  . Clindamycin/Lincomycin  Rash  . Penicillins Rash    Has patient had a PCN reaction causing immediate rash, facial/tongue/throat swelling, SOB or lightheadedness with hypotension: Yes Has patient had a PCN reaction causing severe rash involving mucus membranes or skin necrosis: No Has patient had a PCN reaction that required hospitalization: No Has patient had a PCN reaction occurring within the last 10 years: No If all of the above answers are "NO", then may proceed with Cephalosporin use.     Unable to obtain at this time due to the patient's current mental status.  Prior to Admission medications   Medication Sig Start Date End Date Taking? Authorizing Provider  acetaminophen (TYLENOL) 325 MG tablet Take 650 mg by mouth every 6 (six) hours as needed.   Yes [provider]  amiodarone (PACERONE) 200 MG tablet Take 200 mg by mouth daily.   Yes [provider]  apixaban (ELIQUIS) 5 MG TABS tablet Take 5 mg by mouth 2 (two) times daily.   Yes [provider]  aspirin EC 81 MG tablet Take 81 mg by mouth daily.   Yes [provider]  atorvastatin (LIPITOR) 80 MG tablet Take 80 mg by mouth every evening.   Yes [provider]  bumetanide (BUMEX) 2 MG tablet Take 2 mg by mouth 2 (two) times daily. *May take one additional tablet every 12 hours as needed for increase in edema/shortness of breath   Yes [provider]  carvedilol (COREG) 3.125 MG tablet Take 3.125 mg by mouth 2 (two) times daily with a meal.   Yes [provider]  docusate sodium (COLACE) 100 MG capsule Take 100 mg by mouth daily.   Yes [provider]  DULoxetine (CYMBALTA) 30 MG capsule Take 30 mg by mouth every evening.   Yes [provider]  gabapentin (NEURONTIN) 100 MG capsule Take 100 mg by mouth 3 (three) times daily.   Yes [provider]  guaifenesin (ROBITUSSIN) 100 MG/5ML syrup Take 200 mg by mouth 3 (three) times daily as needed for cough or congestion.    Yes [provider]  isosorbide mononitrate (IMDUR) 60 MG 24 hr tablet Take 60 mg by mouth daily.   Yes [provider]  magnesium oxide (MAG-OX) 400 MG tablet Take 400 mg by mouth daily.   Yes [provider]  nitroGLYCERIN (NITROSTAT) 0.4 MG SL tablet Place 0.4 mg under the tongue every 5 (five) minutes as needed for chest pain.   Yes [provider]  oxycodone (OXY-IR) 5 MG capsule Take 5 mg by mouth every 4 (four) hours as needed for pain.   Yes [provider]  rOPINIRole (REQUIP) 1 MG tablet Take 1 mg by mouth at bedtime.   Yes [provider]  senna (SENOKOT) 8.6 MG TABS tablet Take 1 tablet by mouth at bedtime.   Yes [provider]  spironolactone (ALDACTONE) 12.5 mg TABS tablet Take 12.5 mg by mouth daily.   Yes [provider]  sulfamethoxazole-trimethoprim (BACTRIM DS,SEPTRA DS) 800-160 MG tablet Take 1 tablet by mouth 2 (two) times daily. Starting on 12/08/2016-to be completed on 12/17/2016   Yes [provider]  tiotropium (SPIRIVA) 18 MCG inhalation capsule Place 18 mcg into inhaler and inhale daily.   Yes [provider]  Physical Exam: Vitals:   12/12/16 0336 12/12/16 0400 12/12/16 0401 12/12/16 0430  BP: 111/72   106/73  Pulse: 85     Resp: (!) 28   (!) 23  Temp:      TempSrc:      SpO2: (!) 89% 95% 94%   Weight:        Constitutional: Somnolent, but in NAD, calm. Eyes: PERRL, lids and conjunctivae are mildly pale. ENMT: Mucous membranes are moist. Multiple oral mucosa bleeding abrasions. Dentition is in very poor state of repair.Posterior pharynx clear of any exudate or lesions. Neck: normal, supple, no masses, no thyromegaly Respiratory: Decreased breath sounds on bases, no wheezing, no crackles. Normal respiratory effort. No accessory muscle use.  Cardiovascular: Irregularly irregular, no murmurs / rubs / gallops. No extremity edema. 2+ pedal pulses. No carotid bruits.    Abdomen: Obese, soft, no tenderness, no masses palpated. No hepatosplenomegaly. Bowel sounds positive.  Musculoskeletal: no clubbing / cyanosis. Good ROM, no contractures. Normal muscle tone.  Skin: Small areas of ecchymosis on extremities. Neurologic: Moves all extremities. Due to current mental status, unable to fully evaluate. Psychiatric: Somnolent. Wakes up briefly. Answers simple questions.   Labs on Admission: I have personally reviewed following labs and imaging studies  CBC:  Recent Labs Lab 12/10/16 2143 12/11/16 1839  WBC 6.4 6.0  NEUTROABS 4.9 4.7  HGB 9.7* 9.7*  HCT 31.5* 32.4*  MCV 95.5 97.9  PLT 234 144   Basic Metabolic Panel:  Recent Labs Lab 12/10/16 2143 12/11/16 1839  NA 137 139  K 3.7 3.7  CL 88* 89*  CO2 37* 40*  GLUCOSE 114* 115*  BUN 34* 36*  CREATININE 2.01* 2.05*  CALCIUM 8.6* 8.5*   GFR: CrCl cannot be calculated (Unknown ideal weight.). Liver Function Tests: No results for input(s): AST, ALT, ALKPHOS, BILITOT, PROT, ALBUMIN in the last 168 hours. No results for input(s): LIPASE, AMYLASE in the last 168 hours.  Recent Labs Lab 12/11/16 1839  AMMONIA 48*   Coagulation Profile:  Recent Labs Lab 12/11/16 1839  INR 1.91   Cardiac Enzymes: No results for input(s): CKTOTAL, CKMB, CKMBINDEX, TROPONINI in the last 168 hours. BNP (last 3 results) No results for input(s): PROBNP in the last 8760 hours. HbA1C: No results for input(s): HGBA1C in the last 72 hours. CBG: No results for input(s): GLUCAP in the last 168 hours. Lipid Profile: No results for input(s): CHOL, HDL, LDLCALC, TRIG, CHOLHDL, LDLDIRECT in the last 72 hours. Thyroid Function Tests: No results for input(s): TSH, T4TOTAL, FREET4, T3FREE, THYROIDAB in the last 72 hours. Anemia Panel: No results for input(s): VITAMINB12, FOLATE, FERRITIN, TIBC, IRON, RETICCTPCT in the last 72 hours. Urine analysis: No results found for: COLORURINE, APPEARANCEUR, LABSPEC, [place],  GLUCOSEU, HGBUR, BILIRUBINUR, KETONESUR, PROTEINUR, UROBILINOGEN, NITRITE, LEUKOCYTESUR  Radiological Exams on Admission: Dg Chest 2 View  Result Date: 12/12/2016 CLINICAL DATA:  Shortness of breath and cough. History of atrial fibrillation, coronary artery disease, hypertension, ischemic cardiomyopathy, heart failure. EXAM: CHEST  2 VIEW COMPARISON:  12/11/2016 FINDINGS: Postoperative changes in the mediastinum. Cardiac pacemaker. Shallow inspiration with linear atelectasis in the lung bases. Cardiac enlargement. Vascular congestion. Small bilateral pleural effusions. No significant changes since previous study. No pneumothorax. IMPRESSION: Stable appearance of the chest since previous study. Again demonstrated is cardiac enlargement with pulmonary vascular congestion and bilateral pleural effusions. Shallow inspiration with atelectasis in the lung bases. Electronically Signed   By: Lucienne Capers M.D.   On: 12/12/2016 00:05   Dg Chest  Port 1 View  Result Date: 12/11/2016 CLINICAL DATA:  Shortness of breath. History of diabetes and hypertension. Nonsmoker. EXAM: PORTABLE CHEST 1 VIEW COMPARISON:  None. FINDINGS: Postoperative changes in the mediastinum. Cardiac pacemaker. Shallow inspiration with atelectasis in the lung bases. Cardiac enlargement with mild vascular congestion. No definite edema. Probable small pleural effusions. No pneumothorax. IMPRESSION: Cardiac enlargement with vascular congestion and small bilateral pleural effusions. Atelectasis in the lung bases. Electronically Signed   By: Lucienne Capers M.D.   On: 12/11/2016 05:19    EKG: Independently reviewed. Vent. rate 91 BPM PR interval * ms QRS duration 144 ms QT/QTc 472/580 ms P-R-T axes * 89 99 Atrial fibrillation with occasional ventricular-paced complexes Non-specific intra-ventricular conduction block Cannot rule out Anteroseptal infarct , age undetermined Abnormal ECG No previous ECGs  available  Assessment/Plan Principal Problem:   Acute respiratory insufficiency    Acute respiratory failure with hypoxia and hypercarbia (HCC) The patient has chronic CO2 retention and elevated HCO3. The patient is on daily loop diuretic. Admit to stepdown/inpatient. Continue one-to-one monitoring. Continue supplemental oxygen. Continue frequent suctioning. Bronchodilators nebulizations scheduled and as needed. Will start acetazolamide 500 mg po BID.  Active Problems:   Obstructive sleep apnea Unknown if the patient is using CPAP or BiPAP at bedtime regularly.     Acute psychosis ([place]) Continue medications per psych. Proceed with transfer to psych facility once clinically stable.    Oral bleeding Hold anticoagulation. Continue oral suctioning as needed. Consider then follow her ENT consultation if persistent.    Atrial fibrillation (HCC) CHA?DS?-VASc Score over at least 4. Hold anticoagulation due to persistent oral bleed. Continue amiodarone 200 mg by mouth daily. Continue carvedilol 3.125 mg by mouth twice a day.    Chronic combined systolic and diastolic heart failure (HCC) Continue carvedilol 3.125 mg by mouth twice a day Continue Bumex 2 mg by mouth twice a day. Continue isosorbide mononitrate 60 mg by mouth daily.    Coronary artery disease Anticoagulation and antiplatelet therapy has been held.    Restless leg syndrome Continue Requip 1 mg by mouth at bedtime.    Diabetes mellitus without complication (HCC)   Hypertension Continue carvedilol and Bumex. Monitor blood pressure and electrolytes.    Hyperlipidemia Continue atorvastatin 80 mg by mouth daily. Monitor LFTs as needed. Fasting lipid profile follow-up her PCP.    Hyperammonemia (HCC) Start lactulose 20 g by mouth twice a day.    DVT prophylaxis: On Eliquis. Code Status: Full code. Family Communication: None by bedside. Disposition Plan: Admit for close respiratory monitoring and further  treatment. Consults called:  Admission status: Inpatient/[place].   Reubin Milan MD [facility] Pager [phone].  If 7PM-7AM, please contact night-coverage [email or web address] Password [email or web address]  12/12/2016, 5:02 AM

## 2016-12-12 NOTE — Care Management (Signed)
Patient initially from [facility], had been in ED awaiting placement at a geropsych center. Admitted as inpatient for dyspnea.  CM consulted, unsure as to why, no comments. SW aware. No CM needs. Will sign off.

## 2016-12-12 NOTE — ED Provider Notes (Signed)
Pt continues to ooze blood from mouth This will likely not resolve until eliquis is metabolized His respiratory status is unchanged Will give a nebulized treatment  Awaiting admission When I Was in room pulse ox >90%   Ripley Fraise, MD 12/12/16 [phone]

## 2016-12-12 NOTE — Progress Notes (Signed)
*  PRELIMINARY RESULTS* Echocardiogram 2D Echocardiogram has been performed with Definity.  Samuel Germany 12/12/2016, 1:20 PM

## 2016-12-12 NOTE — ED Provider Notes (Signed)
Pt continues to bleed from mouth It is coming from multiple sources - decaying teeth, abrasion to oral mucosa, abrasion to tongue.   First attempt with TXA oral swish and spit did not significantly improved this Oral bleeding makes bipap difficult Plan is to continue to treat oral bleeding, and if improved will start bipap    Ripley Fraise, MD 12/12/16 0234

## 2016-12-12 NOTE — BH Assessment (Signed)
TTS contacted [facility] and spoke with pt RN.  PT is on a "continuous bipap" machine currently and unable to talk.  This may be removed this afternoon.  Pt cannot be reassessed at this time. Winferd Humphrey, MSW, LCSW Clinical Social Worker 12/12/2016 10:59 AM

## 2016-12-12 NOTE — BH Assessment (Signed)
TTS spoke to [facility].  Pt still has the bipap on and is not available for reassessment.  Will be on through the night. Winferd Humphrey, MSW, LCSW Clinical Social Worker 12/12/2016 5:31 PM

## 2016-12-12 NOTE — ED Notes (Signed)
admitting Provider at bedside. 

## 2016-12-12 NOTE — ED Provider Notes (Signed)
Pt to be admitted He has tried TXA for oral bleeding He will likely need bipap D/w dr Olevia Bowens again for admission    Ripley Fraise, MD 12/12/16 0206

## 2016-12-12 NOTE — Consult Note (Addendum)
Cardiology Consultation:   Patient ID: Darrell Black; [id number]; 1955-12-05   Admit date: 12/10/2016 Date of Consult: 12/12/2016  Primary Care Provider: Renata Caprice, DO Primary Cardiologist: ?Dr. Thurston Hole [facility]  Primary Electrophysiologist:  New at [facility]   Patient Profile:   Darrell Black is a 61 y.o. male with a hx of CAD who is being seen today for the evaluation of CHF at the request of Dr. Clementeen Graham.  History of Present Illness:   Darrell Black was initially brought to the ER from [facility] where he is at for rehab after recent GB surgery with hallucinations and was awaiting transfer to psychiatric facility when he became acutely short of breath and was admitted to ICU with CHF.  Patient has long complex history that he has trouble describing and there is no records in chart. He moved here 4 months ago to live with his niece. Moved from [place] to [place] and then here. Says he had CABG 2001, Defibrillator 2006, revision 2014, stent 2008, acute kidney injury requiring brief dialysis 2016 all in [place]. More stents in [place] 2017, last cath 02/2016 clots in stents and was on Plavix, Xarelto and ASA. ASA recently stopped. Bleeding from mouth ulcers. Has Afib CHADSVASC at least 4, DM, HTN, HLD, depression OSA, morbid obesity. Recently started on O2.  Says he sees ? Dr. Thurston Hole [facility] and was sent to EPS but doesn't know his name. Recently complains of chest tightness and shortness of breath with walking a few feet. Patient doesn't know where he lives or where is Dr.'s are. Has no place to go when he leaves. Confused.  Past Medical History:  Diagnosis Date  . Atherosclerosis   . Atrial fibrillation ([place])   . 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 Chest pain   . Chronic combined systolic and diastolic heart failure ([place])   . Chronic pain   . Coronary artery disease   . 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 Hyperlipidemia   .  Hypertension    essential  . Ischemic cardiomyopathy   . Major depressive disorder, recurrent ([place])   . Morbid obesity due to excess calories ([place])   . Muscle weakness (generalized)   . Neuropathy   . Obstructive sleep apnea   . Presence of automatic cardioverter/defibrillator (AICD)   . Renal disorder    Chronic kidney disease  . Restless leg syndrome   . Sick-euthyroid syndrome     History reviewed. No pertinent surgical history.   Home Medications:  Prior to Admission medications   Medication Sig Start Date End Date Taking? Authorizing Provider  acetaminophen (TYLENOL) 325 MG tablet Take 650 mg by mouth every 6 (six) hours as needed.   Yes [provider]  amiodarone (PACERONE) 200 MG tablet Take 200 mg by mouth daily.   Yes [provider]  apixaban (ELIQUIS) 5 MG TABS tablet Take 5 mg by mouth 2 (two) times daily.   Yes [provider]  aspirin EC 81 MG tablet Take 81 mg by mouth daily.   Yes [provider]  atorvastatin (LIPITOR) 80 MG tablet Take 80 mg by mouth every evening.   Yes [provider]  bumetanide (BUMEX) 2 MG tablet Take 2 mg by mouth 2 (two) times daily. *May take one additional tablet every 12 hours as needed for increase in edema/shortness of breath   Yes [provider]  carvedilol (COREG) 3.125 MG tablet Take 3.125  mg by mouth 2 (two) times daily with a meal.   Yes [provider]  docusate sodium (COLACE) 100 MG capsule Take 100 mg by mouth daily.   Yes [provider]  DULoxetine (CYMBALTA) 30 MG capsule Take 30 mg by mouth every evening.   Yes [provider]  gabapentin (NEURONTIN) 100 MG capsule Take 100 mg by mouth 3 (three) times daily.   Yes [provider]  guaifenesin (ROBITUSSIN) 100 MG/5ML syrup Take 200 mg by mouth 3 (three) times daily as needed for cough or congestion.   Yes [provider]  isosorbide mononitrate (IMDUR) 60 MG 24 hr tablet Take 60  mg by mouth daily.   Yes [provider]  magnesium oxide (MAG-OX) 400 MG tablet Take 400 mg by mouth daily.   Yes [provider]  nitroGLYCERIN (NITROSTAT) 0.4 MG SL tablet Place 0.4 mg under the tongue every 5 (five) minutes as needed for chest pain.   Yes [provider]  oxycodone (OXY-IR) 5 MG capsule Take 5 mg by mouth every 4 (four) hours as needed for pain.   Yes [provider]  rOPINIRole (REQUIP) 1 MG tablet Take 1 mg by mouth at bedtime.   Yes [provider]  senna (SENOKOT) 8.6 MG TABS tablet Take 1 tablet by mouth at bedtime.   Yes [provider]  spironolactone (ALDACTONE) 12.5 mg TABS tablet Take 12.5 mg by mouth daily.   Yes [provider]  sulfamethoxazole-trimethoprim (BACTRIM DS,SEPTRA DS) 800-160 MG tablet Take 1 tablet by mouth 2 (two) times daily. Starting on 12/08/2016-to be completed on 12/17/2016   Yes [provider]  tiotropium (SPIRIVA) 18 MCG inhalation capsule Place 18 mcg into inhaler and inhale daily.   Yes [provider]    Inpatient Medications: Scheduled Meds: . amiodarone  200 mg Oral Daily  . atorvastatin  80 mg Oral QPM  . benztropine  0.5 mg Oral BID  . bumetanide  2 mg Oral BID  . carvedilol  3.125 mg Oral BID WC  . docusate sodium  100 mg Oral Daily  . gabapentin  100 mg Oral BID  . gabapentin  200 mg Oral QHS  . [START ON 12/13/2016] Influenza vac split quadrivalent PF  0.5 mL Intramuscular Tomorrow-1000  . insulin aspart  0-20 Units Subcutaneous TID WC  . ipratropium-albuterol  2.5 mg Nebulization Q6H  . ipratropium-albuterol  3 mL Nebulization Once  . isosorbide mononitrate  60 mg Oral Daily  . lactulose  20 g Oral BID  . magnesium oxide  400 mg Oral Daily  . risperiDONE  0.5 mg Oral QHS  . rOPINIRole  1 mg Oral QHS  . spironolactone  12.5 mg Oral Daily  . tiotropium  18 mcg Inhalation Daily   Continuous Infusions:  PRN Meds: acetaminophen, albuterol,  hydrOXYzine, LORazepam, LORazepam, nitroGLYCERIN, ondansetron **OR** ondansetron (ZOFRAN) IV, oxyCODONE  Allergies:    Allergies  Allergen Reactions  . Ampicillin Rash  . Clindamycin/Lincomycin Rash  . Penicillins Rash    Has patient had a PCN reaction causing immediate rash, facial/tongue/throat swelling, SOB or lightheadedness with hypotension: Yes Has patient had a PCN reaction causing severe rash involving mucus membranes or skin necrosis: No Has patient had a PCN reaction that required hospitalization: No Has patient had a PCN reaction occurring within the last 10 years: No If all of the above answers are "NO", then may proceed with Cephalosporin use.     Social History:   Social History  Social History  . Marital status: Single    Spouse name: N/A  . Number of children: N/A  . Years of education: N/A   Occupational History  . Not on file.   Social History Main Topics  . Smoking status: Never Smoker  . Smokeless tobacco: Never Used  . Alcohol use Not on file  . Drug use: Unknown  . Sexual activity: Not on file   Other Topics Concern  . Not on file   Social History Narrative  . No narrative on file    Family History:   History reviewed. No pertinent family history.   ROS:  Please see the history of present illness.  Review of Systems  Reason unable to perform ROS: patient is confused and history not reliable. family not here.     [name]     Physical Exam/Data:   Vitals:   12/12/16 0401 12/12/16 0430 12/12/16 0500 12/12/16 0811  BP:  106/73 102/71   Pulse:   (!) 105   Resp:  (!) 23 (!) 23   Temp:      TempSrc:      SpO2: 94%  98% 95%  Weight:      Height:    5\' 8"  (1.727 m)    Intake/Output Summary (Last 24 hours) at 12/12/16 0958 Last data filed at 12/12/16 0444  Gross per 24 hour  Intake                0 ml  Output             2350 ml  Net            -2350 ml   Filed Weights   12/12/16 0140  Weight: 263 lb (119.3 kg)   Body mass index is  39.99 kg/m.  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 Telemetry:  Telemetry was personally reviewed and demonstrates:  Afib 110-120  Relevant CV Studies:    Laboratory Data:  Chemistry Recent Labs Lab 12/10/16 2143 12/11/16 1839 12/12/16 0809  NA 137 139 139  K 3.7 3.7 3.7  CL 88* 89* 90*  CO2 37* 40* 42*  GLUCOSE 114* 115* 105*  BUN 34* 36* 35*  CREATININE 2.01* 2.05* 1.90*  CALCIUM 8.6* 8.5* 8.5*  GFRNONAA 34* 33* 36*  GFRAA 39* 39* 42*  ANIONGAP 12 10 7      Recent Labs Lab 12/12/16 0809  PROT 7.2  ALBUMIN 3.7  AST 29  ALT 23  ALKPHOS 149*  BILITOT 1.4*   Hematology Recent Labs Lab 12/10/16 2143 12/11/16 1839 12/12/16 0809  WBC 6.4 6.0 8.1  RBC 3.30* 3.31* 3.37*  HGB 9.7* 9.7* 10.0*  HCT 31.5* 32.4* 33.2*  MCV 95.5 97.9 98.5  MCH 29.4 29.3 29.7  MCHC 30.8 29.9* 30.1  RDW 14.9 15.1 15.1  PLT 234 225 221   Cardiac EnzymesNo results for input(s): TROPONINI in the last 168 hours. No results for  input(s): TROPIPOC in the last 168 hours.  BNP Recent Labs Lab 12/11/16 2330  BNP 583.0*    DDimer No results for input(s): DDIMER in the last 168 hours.  Radiology/Studies:  Dg Chest 2 View  Result Date: 12/12/2016 CLINICAL DATA:  Shortness of breath and cough. History of atrial fibrillation, coronary artery disease, hypertension, ischemic cardiomyopathy, heart failure. EXAM: CHEST  2 VIEW COMPARISON:  12/11/2016  FINDINGS: Postoperative changes in the mediastinum. Cardiac pacemaker. Shallow inspiration with linear atelectasis in the lung bases. Cardiac enlargement. Vascular congestion. Small bilateral pleural effusions. No significant changes since previous study. No pneumothorax. IMPRESSION: Stable appearance of the chest since previous study. Again demonstrated is cardiac enlargement with pulmonary vascular congestion and bilateral pleural effusions. Shallow inspiration with atelectasis in the lung bases. Electronically Signed   By: Lucienne Capers M.D.   On: 12/12/2016 00:05   Dg Chest Port 1 View  Result Date: 12/11/2016 CLINICAL DATA:  Shortness of breath. History of diabetes and hypertension. Nonsmoker. EXAM: PORTABLE CHEST 1 VIEW COMPARISON:  None. FINDINGS: Postoperative changes in the mediastinum. Cardiac pacemaker. Shallow inspiration with atelectasis in the lung bases. Cardiac enlargement with mild vascular congestion. No definite edema. Probable small pleural effusions. No pneumothorax. IMPRESSION: Cardiac enlargement with vascular congestion and small bilateral pleural effusions. Atelectasis in the lung bases. Electronically Signed   By: Lucienne Capers M.D.   On: 12/11/2016 05:19    Assessment and Plan:   1. Acute on chronic systolic CHF. Patient says he thinks heart function is 30%, has ICD, very complex patient with unreliable history and no records. Echo today will help. Diuresed 2350 cc and Crt 1.90 received Lasix 120 mg IV total.On bumex 2 mg BID at [facility]. 2. CAD S/P CABG 2001, stents 2008 [place], 2017 [place], [place], last cath 02/2016 clot in stents on Plavix, ASA, and eliquis now on hold with bleeding from mouth ulcers. All history taken from patient and he is confused so not sure how reliable. Need to talk with niece and find cardiologist. Angina with little activity. 3. ICD 2006 revision 2014. On Imdur 4. Afib on eliquis as outpatient but now on hold? Chronic on Amiodarone and coreg.  Rate 111/m hasn't gotten am meds yet.CHADSVASC at least 4. Would restart Eliquis  5. CKD with AKI 2016 brief dialysis. Crt 6. Morbid obestiy 7. HTN on aldactone,  8. DM2 9. OSA on CPAP 10. Depression, hallucinations was awaiting transfer to psyc unit 11. HLD on lipitor   For questions or updates, please contact [place] Please consult [email or web address] for contact info under Cardiology/STEMI.   Signed, Ermalinda Barrios, PA-C  12/12/2016 9:58 AM   The patient was seen and examined, and I agree with the history, physical exam, assessment and plan as documented above, with modifications as noted below. I have also personally reviewed all relevant documentation, labs, and  Radiographic studies. I have also independently interpreted new ECG's.  61 yr old male with complex medical history as detailed above. Details, however, are quite unclear.  He used to live in [place], [place] and underwent CABG there at [facility] in either 2000 or 2001. He then underwent AICD placement in 2006 and additional coronary artery stenting in 2008. He then underwent a generator change in 2014. He had moved from [facility] to [place], [place], at some point.  He then moved to [place], [place], 2 years ago and lived with a niece. He underwent additional stenting there. He also mentions undergoing an ablation but that  may have been in [place].   He moved here 4 months ago and has once been evaluated by a Dr. Pasi (I did an internet search for this name and found a Dr. Vernon Prey Pasi and a Dr. Cruzita Lederer Pasi, both of whom are cardiologists but I am not certain which one is the patient's cardiologist).  In any event, he is currently hospitalized for acute on chronic systolic heart failure. He has atrial fibrillation and both ASA and Eliquis have been held due to bleeding mouth ulcers. He also has hematuria. He has a Foley in place.  He has been given 120 mg IV Lasix thus far and was restarted on outpatient dose of  Bumex.  He's had over 2.3 L of urine emptied and there is presently an additional 700 cc in the bag yet to be emptied.  He currently denies chest pain. Says his breathing is marginally better. He is on BiPAP but frequently takes it off in order to be able to speak, with O2 sats dropping to the low 70% range when he does.  Recommendations: I agree with obtaining an echocardiogram as this will help sort things out. I have spoken with the nursing staff to contact the [facility] to see if they have any cardiac records. Can try contacting the offices of both Dr. Pasi (s) listed above to see if they have any records.  Will wait to see how he responds to the IV diuretics already provided and monitor renal function to determine the best regimen.  For now, continue Imdur, spironolactone, Coreg, Lipitor, amiodarone, and Bumex until additional records are obtained.   As he is in atrial fibrillation now, I am uncertain if this is paroxysmal, persistent, or chronic. If it is chronic, amiodarone can be discontinued and HR can be controlled with either Coreg or Toprol-XL.  All antiplatelet therapy and anticoagulation has been held due to bleeding mouth ulcers and hematuria. While he is anemic, Hgb is presently stable. I will restart Eliquis 5 mg bid but if he has a precipitous drop in Hgb, this can be held.   Kate Sable, MD, [facility]  Time spent: 70 minutes.  12/12/2016 11:26 AM

## 2016-12-12 NOTE — Progress Notes (Signed)
PROGRESS NOTE                                                                                                                                                                                                             Patient Demographics:    Darrell Black, is a 61 y.o. male, DOB - 26-Jan-1956, ZHY:[id number]  Admit date - 12/10/2016   Admitting Physician Reubin Milan, MD  Outpatient Primary MD for the patient is Renata Caprice, DO  LOS - 0  Outpatient Specialists:None  Chief Complaint  Patient presents with  . V70.1       Brief Narrative   61 year old morbidly obese male with OSA/OHS, chronic kidney disease stage III (baseline unknown), chronic combined systolic and diastolic CHF, A. fib on anticoagulation, coronary artery disease, ischemic cardiomyopathy with history of AICD and CABG, multiple cardiac stent (reportedly last cath in 02/2016) type 2 diabetes mellitus, hypertension, hyperlipidemia, denies muscle weakness, depression, peripheral neuropathy, sick euthyroid syndrome and restless leg syndrome was brought to the ED for progressive visual hallucination for past 4 days. Patient reports moving from [place] about 4 months back to the area. He reported being started on antidepressant in June following which she started having mild visual hallucinations, spiders crawling on the walls and curtains. Symptoms started to get worse and his antidepressant was dropped 5 days back but his symptoms were systemically worse. He was brought to the ED and was awaiting to get transfer to psych facility but started having shortness of breath. He also started bleeding from the his gums. He was given tranexemic acid and multiple suctioning letting which the bleeding has improved. In the ED he was found to be short of breath with cough and wheezing. ABG done showed pH of 7.32, PCO2 of 84, PO2 of 60 and bicarbonate of 37.8. CBC showed normal  WBC, hemoglobin of 9.7 and platelets of 225. INR was 1.91, chemistry showed CO2 of 40, BUN of 36 and creatinine of 2.05. Ammonia of 48. Chest x-ray showed cardiomegaly with vascular congestion. Patient was found to be somewhat somnolent in the ED.  Admitted to hospitalist service for step down monitoring.   Subjective:  Patient having scanty bleeding from his gums (mainly right upper molar tooth) which appear to be infected. Still has some shortness of breath but denies any chest tightness. Patient is a  poor historian and unable to tell when he follows.   Assessment  & Plan :    Principal Problem:   Acute respiratory failure with hypoxia and hypercarbia (HCC) Likely secondary to CHF exacerbation and worsening of his OHS/OSA. - transition to BiPAP. Has received total 120 mg IV Lasix in past 24 hours with good diuresis (-2.3 L). 2-D echo ordered. Monitor strict I/O and daily weight.. Repeat blood  gas this evening. Home dose Bumex resumed. Continue Coreg. When necessary nebs.   Active problem    Acute psychosis ([place]) Unclear if this is associated with any of these medications or new issue. Telemetry psych consulted. Recommended starting Risperdal at bedtime, Vistaril 3 times a day when necessary for anxiety/agitation and Cogentin twice a day for EPS control. Also recommended Ativan when necessary for anxiety which I will hold given his increased somnolence upon admission.    Atrial fibrillation ([place]) Rate controlled. Continue beta blocker and amiodarone. I would hold off on his anticoagulation for nowhe since he is requiring BiPAP and the bleeding has subsided for now. Monitor H&H. Cardiology recommended starting back eliquis back, if no issues with bleeding further and H&h stable this afternoon I will reorder. Hold aspirin and Plavix.  Acute on chronic combined systolic and diastolic CHF (HCC) 2-D echo ordered. Patient reported his last EF was 30%. No results in the system. Patient reported  seeing Dr. Pasi. Will call their office ( [facility]) in [place] to see where he follows and get results. Continue beta blocker, Lipitor, Aldactone and Bumex.     Oral bleeding Improved with tranexemic acid. Has scanty bleeding and hematuria. Holding anticoagulation and antiplatelets. Monitor H&H.  Acute versus acute on Chronic kidney disease stage III Baseline renal function not known. Monitor for now.    Coronary artery disease History of stenting and CABG. Continue beta blocker, Lipitor.     Diabetes mellitus without complication ([place]) Monitor on sliding scale coverage. Check A1c.    Hyperammonemia ([place]) Placed on lactulose.    Restless leg syndrome Continue to requip  Morbid obesity with OSA/OHS Will confirm if patient uses CPAP at night.  Hypomagnesemia Replenish  Code Status : Full code  Family Communication  : None at bedside  Disposition Plan  : Pending hospital course,  Barriers For Discharge : Active symptoms  Consults  :   Cardiology  Procedures  :  2-D echo  DVT Prophylaxis  :  SCDs.  Lab Results  Component Value Date   PLT 221 12/12/2016    Antibiotics  :  Anti-infectives    Start     Dose/Rate Route Frequency Ordered Stop   12/11/16 0000  sulfamethoxazole-trimethoprim (BACTRIM DS,SEPTRA DS) 800-160 MG per tablet 1 tablet  Status:  Discontinued     1 tablet Oral 2 times daily 12/10/16 2350 12/12/16 0700        Objective:   Vitals:   12/12/16 0830 12/12/16 0900 12/12/16 0930 12/12/16 1000  BP: 98/73 (!) 100/48 124/70 105/69  Pulse: (!) 103 (!) 103 (!) 102 (!) 106  Resp: (!) 27 (!) 26 (!) 27 (!) 21  Temp:      TempSrc:      SpO2: 95% 92% 93% 92%  Weight:      Height:        Wt Readings from Last 3 Encounters:  12/12/16 119.3 kg (263 lb)     Intake/Output Summary (Last 24 hours) at 12/12/16 1201 Last data filed at 12/12/16 0444  Gross per 24  hour  Intake                0 ml  Output             2350 ml  Net             -2350 ml     Physical Exam Gen.: Morbidly obese male lying in bed on high flow nasal cannula HEENT: Pupils reactive bilaterally, no pallor, no icterus, moist oral mucosa with multiple caried teeth and bleeding from the gums. No JVD appreciated Chest: Diminished bilateral breath sounds., No added sounds CVS: S1 and S2 irregular, no murmurs rub or gallop GI: Soft, nondistended, nontender, Foley catheter placed in ED with hematuria Musculoskeletal: Warm, 1+ pitting edema bilaterally with erythema and scanty superficial bleeding from the left leg CNS: Alert and oriented, nonfocal     Data Review:    CBC  Recent Labs Lab 12/10/16 2143 12/11/16 1839 12/12/16 0809  WBC 6.4 6.0 8.1  HGB 9.7* 9.7* 10.0*  HCT 31.5* 32.4* 33.2*  PLT 234 225 221  MCV 95.5 97.9 98.5  MCH 29.4 29.3 29.7  MCHC 30.8 29.9* 30.1  RDW 14.9 15.1 15.1  LYMPHSABS 0.7 0.5* 0.7  MONOABS 0.6 0.6 0.4  EOSABS 0.2 0.1 0.1  BASOSABS 0.0 0.1 0.0    Chemistries   Recent Labs Lab 12/10/16 2143 12/11/16 1839 12/12/16 0809  NA 137 139 139  K 3.7 3.7 3.7  CL 88* 89* 90*  CO2 37* 40* 42*  GLUCOSE 114* 115* 105*  BUN 34* 36* 35*  CREATININE 2.01* 2.05* 1.90*  CALCIUM 8.6* 8.5* 8.5*  MG  --   --  1.5*  AST  --   --  29  ALT  --   --  23  ALKPHOS  --   --  149*  BILITOT  --   --  1.4*   ------------------------------------------------------------------------------------------------------------------ No results for input(s): CHOL, HDL, LDLCALC, TRIG, CHOLHDL, LDLDIRECT in the last 72 hours.  No results found for: HGBA1C ------------------------------------------------------------------------------------------------------------------ No results for input(s): TSH, T4TOTAL, T3FREE, THYROIDAB in the last 72 hours.  Invalid input(s): FREET3 ------------------------------------------------------------------------------------------------------------------ No results for input(s): VITAMINB12, FOLATE, FERRITIN,  TIBC, IRON, RETICCTPCT in the last 72 hours.  Coagulation profile  Recent Labs Lab 12/11/16 1839  INR 1.91    No results for input(s): DDIMER in the last 72 hours.  Cardiac Enzymes No results for input(s): CKMB, TROPONINI, MYOGLOBIN in the last 168 hours.  Invalid input(s): CK ------------------------------------------------------------------------------------------------------------------    Component Value Date/Time   BNP 583.0 (H) 12/11/2016 2330    Inpatient Medications  Scheduled Meds: . amiodarone  200 mg Oral Daily  . apixaban  5 mg Oral BID  . atorvastatin  80 mg Oral QPM  . benztropine  0.5 mg Oral BID  . bumetanide  2 mg Oral BID  . carvedilol  3.125 mg Oral BID WC  . docusate sodium  100 mg Oral Daily  . gabapentin  100 mg Oral BID  . gabapentin  200 mg Oral QHS  . [START ON 12/13/2016] Influenza vac split quadrivalent PF  0.5 mL Intramuscular Tomorrow-1000  . insulin aspart  0-20 Units Subcutaneous TID WC  . ipratropium-albuterol  2.5 mg Nebulization Q6H  . ipratropium-albuterol  3 mL Nebulization Once  . isosorbide mononitrate  60 mg Oral Daily  . lactulose  20 g Oral BID  . magnesium oxide  400 mg Oral Daily  . risperiDONE  0.5 mg Oral QHS  . rOPINIRole  1 mg Oral QHS  . spironolactone  12.5 mg Oral Daily  . tiotropium  18 mcg Inhalation Daily   Continuous Infusions: PRN Meds:.acetaminophen, albuterol, hydrOXYzine, LORazepam, LORazepam, nitroGLYCERIN, ondansetron **OR** ondansetron (ZOFRAN) IV, oxyCODONE  Micro Results No results found for this or any previous visit (from the past 240 hour(s)).  Radiology Reports Dg Chest 2 View  Result Date: 12/12/2016 CLINICAL DATA:  Shortness of breath and cough. History of atrial fibrillation, coronary artery disease, hypertension, ischemic cardiomyopathy, heart failure. EXAM: CHEST  2 VIEW COMPARISON:  12/11/2016 FINDINGS: Postoperative changes in the mediastinum. Cardiac pacemaker. Shallow inspiration with  linear atelectasis in the lung bases. Cardiac enlargement. Vascular congestion. Small bilateral pleural effusions. No significant changes since previous study. No pneumothorax. IMPRESSION: Stable appearance of the chest since previous study. Again demonstrated is cardiac enlargement with pulmonary vascular congestion and bilateral pleural effusions. Shallow inspiration with atelectasis in the lung bases. Electronically Signed   By: Lucienne Capers M.D.   On: 12/12/2016 00:05   Dg Chest Port 1 View  Result Date: 12/11/2016 CLINICAL DATA:  Shortness of breath. History of diabetes and hypertension. Nonsmoker. EXAM: PORTABLE CHEST 1 VIEW COMPARISON:  None. FINDINGS: Postoperative changes in the mediastinum. Cardiac pacemaker. Shallow inspiration with atelectasis in the lung bases. Cardiac enlargement with mild vascular congestion. No definite edema. Probable small pleural effusions. No pneumothorax. IMPRESSION: Cardiac enlargement with vascular congestion and small bilateral pleural effusions. Atelectasis in the lung bases. Electronically Signed   By: Lucienne Capers M.D.   On: 12/11/2016 05:19    Time Spent in minutes  25   Louellen Molder M.D on 12/12/2016 at 12:01 PM  Between 7am to 7pm - Pager - [phone]  After 7pm go to [email or web address] - password [facility]  [facility] -  Office  [phone]

## 2016-12-12 NOTE — ED Provider Notes (Signed)
Pt appears to have worsening dyspnea He is drowsy but easily arousable He appears to have chronic respiratory failure per ABG CXR is read as unchanged However compared to previous evening his respiratory effort is worse D/w dr Olevia Bowens for medical admission He continues to bleed from mouth, and on exam it appears he has decayed molar tooth that is oozing blood Will need to stop this prior to any Romero Liner, MD 12/12/16 [phone]

## 2016-12-12 NOTE — ED Notes (Signed)
Pt given gauze to bite down on after oral solution swish and spit. Gauze now saturated with blood.

## 2016-12-12 NOTE — Progress Notes (Signed)
Per nurse at [facility], pt's cardiologist is Dr Pasi at [facility]. Pt has signed a request for release of records; copy in shadow chart. Request has been faxed. Currently awaiting response.

## 2016-12-13 DIAGNOSIS — I25119 Atherosclerotic heart disease of native coronary artery with unspecified angina pectoris: Secondary | ICD-10-CM

## 2016-12-13 DIAGNOSIS — Z9989 Dependence on other enabling machines and devices: Secondary | ICD-10-CM

## 2016-12-13 DIAGNOSIS — N289 Disorder of kidney and ureter, unspecified: Secondary | ICD-10-CM

## 2016-12-13 DIAGNOSIS — I5022 Chronic systolic (congestive) heart failure: Secondary | ICD-10-CM

## 2016-12-13 DIAGNOSIS — I255 Ischemic cardiomyopathy: Secondary | ICD-10-CM

## 2016-12-13 LAB — BLOOD GAS, ARTERIAL
ACID-BASE DEFICIT: 18.7 mmol/L — AB (ref 0.0–2.0)
BICARBONATE: 41 mmol/L — AB (ref 20.0–28.0)
DRAWN BY: 277331
Delivery systems: POSITIVE
EXPIRATORY PAP: 8
FIO2: 0.45
Inspiratory PAP: 14
O2 Saturation: 91 %
PATIENT TEMPERATURE: 37
PH ART: 7.34 — AB (ref 7.350–7.450)
RATE: 8 resp/min
pCO2 arterial: 86.6 mmHg (ref 32.0–48.0)
pO2, Arterial: 67.5 mmHg — ABNORMAL LOW (ref 83.0–108.0)

## 2016-12-13 LAB — HEMOGLOBIN AND HEMATOCRIT, BLOOD
HCT: 32.6 % — ABNORMAL LOW (ref 39.0–52.0)
HEMATOCRIT: 31.6 % — AB (ref 39.0–52.0)
Hemoglobin: 9.3 g/dL — ABNORMAL LOW (ref 13.0–17.0)
Hemoglobin: 9.5 g/dL — ABNORMAL LOW (ref 13.0–17.0)

## 2016-12-13 LAB — BASIC METABOLIC PANEL
Anion gap: 9 (ref 5–15)
BUN: 39 mg/dL — AB (ref 6–20)
CALCIUM: 8.5 mg/dL — AB (ref 8.9–10.3)
CHLORIDE: 88 mmol/L — AB (ref 101–111)
CO2: 45 mmol/L — AB (ref 22–32)
CREATININE: 1.98 mg/dL — AB (ref 0.61–1.24)
GFR calc non Af Amer: 35 mL/min — ABNORMAL LOW (ref >60)
GFR, EST AFRICAN AMERICAN: 40 mL/min — AB (ref >60)
GLUCOSE: 103 mg/dL — AB (ref 65–99)
POTASSIUM: 3.7 mmol/L (ref 3.5–5.1)
SODIUM: 142 mmol/L (ref 135–145)

## 2016-12-13 LAB — HIV ANTIBODY (ROUTINE TESTING W REFLEX): HIV Screen 4th Generation wRfx: NONREACTIVE

## 2016-12-13 LAB — GLUCOSE, CAPILLARY
GLUCOSE-CAPILLARY: 99 mg/dL (ref 65–99)
GLUCOSE-CAPILLARY: 101 mg/dL — AB (ref 65–99)
GLUCOSE-CAPILLARY: 114 mg/dL — AB (ref 65–99)
Glucose-Capillary: 90 mg/dL (ref 65–99)

## 2016-12-13 LAB — MAGNESIUM: MAGNESIUM: 1.9 mg/dL (ref 1.7–2.4)

## 2016-12-13 MED ORDER — ORAL CARE MOUTH RINSE
15.0000 mL | Freq: Two times a day (BID) | OROMUCOSAL | Status: DC
  Administered 2016-12-14 – 2016-12-17 (×4): 15 mL via OROMUCOSAL

## 2016-12-13 MED ORDER — CHLORHEXIDINE GLUCONATE 0.12 % MT SOLN
15.0000 mL | Freq: Two times a day (BID) | OROMUCOSAL | Status: DC
  Administered 2016-12-14 – 2016-12-17 (×5): 15 mL via OROMUCOSAL
  Filled 2016-12-13: qty 15

## 2016-12-13 MED ORDER — APIXABAN 5 MG PO TABS
5.0000 mg | ORAL_TABLET | Freq: Two times a day (BID) | ORAL | Status: DC
  Administered 2016-12-13 – 2016-12-14 (×3): 5 mg via ORAL
  Filled 2016-12-13 (×3): qty 1

## 2016-12-13 MED ORDER — LORAZEPAM 2 MG/ML IJ SOLN
1.0000 mg | INTRAMUSCULAR | Status: DC | PRN
  Administered 2016-12-13 – 2016-12-15 (×4): 1 mg via INTRAVENOUS
  Filled 2016-12-13 (×4): qty 1

## 2016-12-13 MED ORDER — MAGNESIUM SULFATE IN D5W 1-5 GM/100ML-% IV SOLN
1.0000 g | Freq: Once | INTRAVENOUS | Status: AC
  Administered 2016-12-13: 1 g via INTRAVENOUS
  Filled 2016-12-13: qty 100

## 2016-12-13 NOTE — Progress Notes (Addendum)
PROGRESS NOTE                                                                                                                                                                                                             Patient Demographics:    Darrell Black, is a 61 y.o. male, DOB - [birth year], CMK:[id number]  Admit date - 12/10/2016   Admitting Physician Reubin Milan, MD  Outpatient Primary MD for the patient is Renata Caprice, DO  LOS - 1  Outpatient Specialists:None  Chief Complaint  Patient presents with  . V70.1       Brief Narrative   61 year old morbidly obese male with OSA/OHS, chronic kidney disease stage III (baseline unknown), chronic combined systolic and diastolic CHF, A. fib on anticoagulation, coronary artery disease, ischemic cardiomyopathy with history of AICD and CABG, multiple cardiac stent (reportedly last cath in 02/2016) type 2 diabetes mellitus, hypertension, hyperlipidemia, denies muscle weakness, depression, peripheral neuropathy, sick euthyroid syndrome and restless leg syndrome was brought to the ED for progressive visual hallucination for past 4 days. Patient reports moving from [place] about 4 months back to the area. He reported being started on antidepressant in June following which she started having mild visual hallucinations, spiders crawling on the walls and curtains. Symptoms started to get worse and his antidepressant was dropped 5 days back but his symptoms were systemically worse. He was brought to the ED and was awaiting to get transfer to psych facility but started having shortness of breath. He also started bleeding from the his gums. He was given tranexemic acid and multiple suctioning letting which the bleeding has improved. In the ED he was found to be short of breath with cough and wheezing. ABG done showed pH of 7.32, PCO2 of 84, PO2 of 60 and bicarbonate of 37.8. CBC showed normal  WBC, hemoglobin of 9.7 and platelets of 225. INR was 1.91, chemistry showed CO2 of 40, BUN of 36 and creatinine of 2.05. Ammonia of 48. Chest x-ray showed cardiomegaly with vascular congestion. Patient was found to be somewhat somnolent in the ED.  Admitted to hospitalist service on step down monitoring.   Subjective:   On BiPAP all nights. No overnight issues   Assessment  & Plan :    Principal Problem:   Acute respiratory failure with hypoxia and hypercarbia (HCC) Likely  secondary to CHF exacerbation and worsening of his OHS/OSA. - suspect he's a chronic CO2  retainer. Continue BiPAP for today. -4L since admission ( got 120 mg IV lasx todal) Home dose Bumex resumed. Continue Coreg. When necessary nebs.   Active problem    Acute psychosis ([place]) Unclear if this is associated with any of these medications or new issue. Telemetry psych consulted. Recommended starting Risperdal at bedtime, Vistaril 3 times a day when necessary for anxiety/agitation and Cogentin twice a day for EPS control. Also recommended Ativan when necessary for anxiety . Denies hallucinations at present.    Paroxysmal Atrial fibrillation (HCC) Rate controlled. Continue beta blocker and amiodarone. I would hold off on his anticoagulation for now since he is requiring BiPAP and the bleeding has subsided for now. Monitor H&H and check hemoccult. If negative, will resume eliquis.. Hold aspirin and Plavix.  Acute on chronic combined systolic and diastolic CHF (HCC) 2-D echo shows EF 35-40% diffuse hypokinesis. PA pressure of 38. Continue beta blocker, Lipitor, Aldactone and Bumex.     Oral bleeding Improved with tranexemic acid. Has scanty bleeding and hematuria. Holding anticoagulation and antiplatelets. Monitor H&H.  Acute versus acute on Chronic kidney disease stage III Baseline renal function not known. Monitor for now.    Coronary artery disease History of stenting and CABG. Continue beta blocker,  Lipitor.     Diabetes mellitus without complication ([place]) Monitor on sliding scale coverage. Check A1c.    Hyperammonemia ([place]) Placed on lactulose.    Restless leg syndrome Continue to requip.  Morbid obesity with OSA/OHS doesnot use CPAP at home. Will need sleep study as outpt  Hypomagnesemia Replenished  Code Status : Full code  Family Communication  : None at bedside  Disposition Plan  : Pending hospital course,  Barriers For Discharge : Active symptoms  Consults  :   Cardiology  Procedures  :  2-D echo  DVT Prophylaxis  :  SCDs.  Lab Results  Component Value Date   PLT 221 12/12/2016    Antibiotics  :  Anti-infectives    Start     Dose/Rate Route Frequency Ordered Stop   12/11/16 0000  sulfamethoxazole-trimethoprim (BACTRIM DS,SEPTRA DS) 800-160 MG per tablet 1 tablet  Status:  Discontinued     1 tablet Oral 2 times daily 12/10/16 2350 12/12/16 0700        Objective:   Vitals:   12/13/16 0500 12/13/16 0700 12/13/16 0800 12/13/16 0816  BP: (!) 112/93 (!) 97/56 98/64   Pulse: 82 75 94 86  Resp: (!) 25 (!) 24 20   Temp:      TempSrc:      SpO2: 92% 91% 90% 91%  Weight: (!) 147.1 kg (324 lb 4.8 oz)     Height:        Wt Readings from Last 3 Encounters:  12/13/16 (!) 147.1 kg (324 lb 4.8 oz)     Intake/Output Summary (Last 24 hours) at 12/13/16 0938 Last data filed at 12/13/16 0500  Gross per 24 hour  Intake              470 ml  Output             2150 ml  Net            -1680 ml     Physical Exam Gen.: Morbidly obese needed on BiPAP HEENT: No further oral bleeding Chest: Diminished basilar breath sounds  CVS: S1 and S2 regular, no murmurs rubs or  gallop GI: soft, nondistended, nontender,  Foley with clear urine. Musculoskeletal: warm, 1+ pitting edema , b/l     Data Review:    CBC  Recent Labs Lab 12/10/16 2143 12/11/16 1839 12/12/16 0809 12/12/16 1630 12/13/16 0358  WBC 6.4 6.0 8.1  --   --   HGB 9.7* 9.7* 10.0* 9.8*  9.3*  HCT 31.5* 32.4* 33.2* 32.2* 31.6*  PLT 234 225 221  --   --   MCV 95.5 97.9 98.5  --   --   MCH 29.4 29.3 29.7  --   --   MCHC 30.8 29.9* 30.1  --   --   RDW 14.9 15.1 15.1  --   --   LYMPHSABS 0.7 0.5* 0.7  --   --   MONOABS 0.6 0.6 0.4  --   --   EOSABS 0.2 0.1 0.1  --   --   BASOSABS 0.0 0.1 0.0  --   --     Chemistries   Recent Labs Lab 12/10/16 2143 12/11/16 1839 12/12/16 0809 12/13/16 0358  NA 137 139 139 142  K 3.7 3.7 3.7 3.7  CL 88* 89* 90* 88*  CO2 37* 40* 42* 45*  GLUCOSE 114* 115* 105* 103*  BUN 34* 36* 35* 39*  CREATININE 2.01* 2.05* 1.90* 1.98*  CALCIUM 8.6* 8.5* 8.5* 8.5*  MG  --   --  1.5* 1.9  AST  --   --  29  --   ALT  --   --  23  --   ALKPHOS  --   --  149*  --   BILITOT  --   --  1.4*  --    ------------------------------------------------------------------------------------------------------------------ No results for input(s): CHOL, HDL, LDLCALC, TRIG, CHOLHDL, LDLDIRECT in the last 72 hours.  Lab Results  Component Value Date   HGBA1C 5.4 12/12/2016   ------------------------------------------------------------------------------------------------------------------ No results for input(s): TSH, T4TOTAL, T3FREE, THYROIDAB in the last 72 hours.  Invalid input(s): FREET3 ------------------------------------------------------------------------------------------------------------------ No results for input(s): VITAMINB12, FOLATE, FERRITIN, TIBC, IRON, RETICCTPCT in the last 72 hours.  Coagulation profile  Recent Labs Lab 12/11/16 1839  INR 1.91    No results for input(s): DDIMER in the last 72 hours.  Cardiac Enzymes No results for input(s): CKMB, TROPONINI, MYOGLOBIN in the last 168 hours.  Invalid input(s): CK ------------------------------------------------------------------------------------------------------------------    Component Value Date/Time   BNP 583.0 (H) 12/11/2016 2330    Inpatient Medications  Scheduled  Meds: . amiodarone  200 mg Oral Daily  . apixaban  5 mg Oral BID  . atorvastatin  80 mg Oral QPM  . benztropine  0.5 mg Oral BID  . bumetanide  2 mg Oral BID  . carvedilol  3.125 mg Oral BID WC  . docusate sodium  100 mg Oral Daily  . gabapentin  100 mg Oral BID  . gabapentin  200 mg Oral QHS  . Influenza vac split quadrivalent PF  0.5 mL Intramuscular Tomorrow-1000  . insulin aspart  0-20 Units Subcutaneous TID WC  . ipratropium-albuterol  2.5 mg Nebulization Q6H  . ipratropium-albuterol  3 mL Nebulization Once  . isosorbide mononitrate  60 mg Oral Daily  . lactulose  20 g Oral BID  . magnesium oxide  400 mg Oral Daily  . risperiDONE  0.5 mg Oral QHS  . rOPINIRole  1 mg Oral QHS  . spironolactone  12.5 mg Oral Daily  . tiotropium  18 mcg Inhalation Daily   Continuous Infusions: . magnesium sulfate 1 -  4 g bolus IVPB     PRN Meds:.acetaminophen, albuterol, hydrOXYzine, LORazepam, nitroGLYCERIN, ondansetron **OR** ondansetron (ZOFRAN) IV, oxyCODONE  Micro Results Recent Results (from the past 240 hour(s))  MRSA PCR Screening     Status: None   Collection Time: 12/12/16  6:47 AM  Result Value Ref Range Status   MRSA by PCR NEGATIVE NEGATIVE Final    Comment:        The GeneXpert MRSA Assay (FDA approved for NASAL specimens only), is one component of a comprehensive MRSA colonization surveillance program. It is not intended to diagnose MRSA infection nor to guide or monitor treatment for MRSA infections.     Radiology Reports Dg Chest 2 View  Result Date: 12/12/2016 CLINICAL DATA:  Shortness of breath and cough. History of atrial fibrillation, coronary artery disease, hypertension, ischemic cardiomyopathy, heart failure. EXAM: CHEST  2 VIEW COMPARISON:  12/11/2016 FINDINGS: Postoperative changes in the mediastinum. Cardiac pacemaker. Shallow inspiration with linear atelectasis in the lung bases. Cardiac enlargement. Vascular congestion. Small bilateral pleural effusions.  No significant changes since previous study. No pneumothorax. IMPRESSION: Stable appearance of the chest since previous study. Again demonstrated is cardiac enlargement with pulmonary vascular congestion and bilateral pleural effusions. Shallow inspiration with atelectasis in the lung bases. Electronically Signed   By: Lucienne Capers M.D.   On: 12/12/2016 00:05   Dg Chest Port 1 View  Result Date: 12/11/2016 CLINICAL DATA:  Shortness of breath. History of diabetes and hypertension. Nonsmoker. EXAM: PORTABLE CHEST 1 VIEW COMPARISON:  None. FINDINGS: Postoperative changes in the mediastinum. Cardiac pacemaker. Shallow inspiration with atelectasis in the lung bases. Cardiac enlargement with mild vascular congestion. No definite edema. Probable small pleural effusions. No pneumothorax. IMPRESSION: Cardiac enlargement with vascular congestion and small bilateral pleural effusions. Atelectasis in the lung bases. Electronically Signed   By: Lucienne Capers M.D.   On: 12/11/2016 05:19    Time Spent in minutes  35   Louellen Molder M.D on 12/13/2016 at 9:38 AM  Between 7am to 7pm - Pager - [phone]  After 7pm go to [email or web address] - password [facility]  [facility] -  Office  [phone]

## 2016-12-13 NOTE — Progress Notes (Signed)
Progress Note  Patient Name: Darrell Black Date of Encounter: 12/13/2016  Primary Cardiologist: Dr. Vernon Prey [facility]) Consulting Cardiologist: Dr. Kate Sable  Subjective   Currently on BiPAP, reports breathing more easily and no recent chest pain. No abdominal pain, nausea or emesis. Sitter is in the room.  Inpatient Medications    Scheduled Meds: . amiodarone  200 mg Oral Daily  . apixaban  5 mg Oral BID  . atorvastatin  80 mg Oral QPM  . benztropine  0.5 mg Oral BID  . bumetanide  2 mg Oral BID  . carvedilol  3.125 mg Oral BID WC  . docusate sodium  100 mg Oral Daily  . gabapentin  100 mg Oral BID  . gabapentin  200 mg Oral QHS  . Influenza vac split quadrivalent PF  0.5 mL Intramuscular Tomorrow-1000  . insulin aspart  0-20 Units Subcutaneous TID WC  . ipratropium-albuterol  2.5 mg Nebulization Q6H  . ipratropium-albuterol  3 mL Nebulization Once  . isosorbide mononitrate  60 mg Oral Daily  . lactulose  20 g Oral BID  . magnesium oxide  400 mg Oral Daily  . risperiDONE  0.5 mg Oral QHS  . rOPINIRole  1 mg Oral QHS  . spironolactone  12.5 mg Oral Daily  . tiotropium  18 mcg Inhalation Daily   . magnesium sulfate 1 - 4 g bolus IVPB     PRN Meds: acetaminophen, albuterol, hydrOXYzine, LORazepam, nitroGLYCERIN, ondansetron **OR** ondansetron (ZOFRAN) IV, oxyCODONE   Vital Signs    Vitals:   12/13/16 0500 12/13/16 0700 12/13/16 0800 12/13/16 0816  BP: (!) 112/93 (!) 97/56 98/64   Pulse: 82 75 94 86  Resp: (!) 25 (!) 24 20   Temp:      TempSrc:      SpO2: 92% 91% 90% 91%  Weight: (!) 324 lb 4.8 oz (147.1 kg)     Height:        Intake/Output Summary (Last 24 hours) at 12/13/16 0917 Last data filed at 12/13/16 0500  Gross per 24 hour  Intake              470 ml  Output             2150 ml  Net            -1680 ml   Filed Weights   12/12/16 0140 12/13/16 0500  Weight: 263 lb (119.3 kg) (!) 324 lb 4.8 oz (147.1 kg)    Telemetry      Atrial fibrillation with intermittent ventricular pacing. Personally reviewed.  ECG    Tracing from 12/11/2016 shows atrial fibrillation with possible old anterior infarct pattern and intermittent ventricular paced beats Personally reviewed.  Physical Exam   GEN: Morbidly obese, chronically ill-appearing male. No acute distress.   Neck:  Increased girth. Difficult to assess JVP. Cardiac:  Indistinct PMI with irregularly irregular rhythm, no rub or gallop.  Respiratory:  Diminished breath sounds without wheezing. Wearing BiPAP mask. GI:  Morbidly obese, nontender, bowel sounds present. MS:  Chronic appearing leg edema with dressings on the lower legs.; No deformity. Neuro:  Nonfocal. Psych: Alert and oriented x 2. Normal affect.  Labs    Chemistry  Recent Labs Lab 12/11/16 1839 12/12/16 0809 12/13/16 0358  NA 139 139 142  K 3.7 3.7 3.7  CL 89* 90* 88*  CO2 40* 42* 45*  GLUCOSE 115* 105* 103*  BUN 36* 35* 39*  CREATININE 2.05* 1.90* 1.98*  CALCIUM 8.5* 8.5* 8.5*  PROT  --  7.2  --   ALBUMIN  --  3.7  --   AST  --  29  --   ALT  --  23  --   ALKPHOS  --  149*  --   BILITOT  --  1.4*  --   GFRNONAA 33* 36* 35*  GFRAA 39* 42* 40*  ANIONGAP 10 7 9      Hematology  Recent Labs Lab 12/10/16 2143 12/11/16 1839 12/12/16 0809 12/12/16 1630 12/13/16 0358  WBC 6.4 6.0 8.1  --   --   RBC 3.30* 3.31* 3.37*  --   --   HGB 9.7* 9.7* 10.0* 9.8* 9.3*  HCT 31.5* 32.4* 33.2* 32.2* 31.6*  MCV 95.5 97.9 98.5  --   --   MCH 29.4 29.3 29.7  --   --   MCHC 30.8 29.9* 30.1  --   --   RDW 14.9 15.1 15.1  --   --   PLT 234 225 221  --   --     Cardiac EnzymesNo results for input(s): TROPONINI in the last 168 hours. No results for input(s): TROPIPOC in the last 168 hours.   BNP  Recent Labs Lab 12/11/16 2330  BNP 583.0*     Radiology    Dg Chest 2 View  Result Date: 12/12/2016 CLINICAL DATA:  Shortness of breath and cough. History of atrial fibrillation, coronary  artery disease, hypertension, ischemic cardiomyopathy, heart failure. EXAM: CHEST  2 VIEW COMPARISON:  12/11/2016 FINDINGS: Postoperative changes in the mediastinum. Cardiac pacemaker. Shallow inspiration with linear atelectasis in the lung bases. Cardiac enlargement. Vascular congestion. Small bilateral pleural effusions. No significant changes since previous study. No pneumothorax. IMPRESSION: Stable appearance of the chest since previous study. Again demonstrated is cardiac enlargement with pulmonary vascular congestion and bilateral pleural effusions. Shallow inspiration with atelectasis in the lung bases. Electronically Signed   By: Lucienne Capers M.D.   On: 12/12/2016 00:05    Cardiac Studies   Echocardiogram 12/12/2016: Study Conclusions  - Procedure narrative: Contrast enhancement employed. - Left ventricle: The cavity size was normal. Wall thickness was   increased in a pattern of mild LVH. Systolic function was   moderately reduced. The estimated ejection fraction was in the   range of 35% to 40%. Diffuse hypokinesis. The study was not   technically sufficient to allow evaluation of LV diastolic   dysfunction due to atrial fibrillation. - Regional wall motion abnormality: Akinesis of the apical septal   and apical myocardium; moderate hypokinesis of the basal-mid   anteroseptal myocardium. - Aortic valve: Mildly calcified leaflets. - Mitral valve: Moderately calcified annulus. Normal thickness   leaflets . - Left atrium: The atrium was mildly dilated. - Right ventricle: Pacer wire or catheter noted in right ventricle.   Systolic function was mildly reduced. - Tricuspid valve: There was mild regurgitation. - Pulmonary arteries: PA peak pressure: 38 mm Hg (S). - Systemic veins: IVC is dilated wtih normal respiratory variation.   Estimated CVP 8 mmHg.  Patient Profile     61 y.o. male with a history of ischemic cardiomyopathy, CAD status post previous PCI and CABG, persistent  atrial fibrillation, obstructive sleep apnea, seen Jude ICD in place, and acute on chronic renal insufficiency. Based on recent records from [facility], patient's LVEF was documented to be 30% and he has had recent heart failure symptoms as well as intermittent chest pain. Reportedly, he had been told based  on previous workup in [place] back in December 2017 that there were no clear revascularization options in terms PCI, although no records are available to support this at present.  Assessment & Plan    1. Acute on chronic systolic heart failure. Patient currently on Bumex and Aldactone with reasonable urine output, 1600 cc net in the last 24 hours. He reports that he is breathing more easily, but baseline status seems to be somewhat tenuous based on review of recent records.  2. Ischemic cardiomyopathy, LVEF 35-40% based on echocardiogram from yesterday, previously assessed at approximately 30% at [facility] in July. He is on Coreg and Aldactone. Presently not on ACE inhibitor or ARB, presumably with history of renal insufficiency and at this point low normal to low blood pressure.  3. Persistent atrial fibrillation. On Eliquis for stroke prophylaxis. He is also on amiodarone which was present prior to admission, presumably with history of paroxysmal atrial fibrillation although uncertain if he has had any VT documented. Heart rate is adequately controlled at this time.  4. St. Jude ICD in place.  5. OSA on CPAP.  6. Morbid obesity.  7. Acute on chronic renal insufficiency with baseline CKD stage 3. Creatinine currently 1.9. Potassium normal.  8. Anemia, baseline uncertain. Current hemoglobin 9.3 down from 10.0.  9. Hyperlipidemia, on Lipitor.  Medically complex and high risk patient. Some records received from [facility] regarding recent cardiac assessment by Dr. Pasi. Patient still requiring supportive measures. In terms of cardiac regimen, would continue Coreg,  amiodarone, Aldactone, Imdur, and Bumex. No ACE inhibitor or ARB with renal insufficiency, although tolerating Aldactone so far. Continue with diuresis, follow-up BMET. Would guaiac stools given mild drop in hemoglobin - if this continues may need to hold off on Eliquis temporarily.  Signed, Rozann Lesches, MD  12/13/2016, 9:17 AM

## 2016-12-13 NOTE — Progress Notes (Signed)
Not able to obtain accurate O2 reading due to patient's constant moving.

## 2016-12-13 NOTE — Progress Notes (Signed)
Pt very agitated and restless this evening. Patient trying to get out of bed and pulling at cords.  Patient is easily redirected and will answer questions.  Patient is confused to time and situation. He is very hard to understand. Patient is able to swallow pills and drink without complication. RN paged MD for more frequent does of ativan. Another ABG ordered as well. Patient remains on BIPAP  Margaret Pyle, RN

## 2016-12-14 ENCOUNTER — Inpatient Hospital Stay (HOSPITAL_COMMUNITY): Payer: Medicaid Other

## 2016-12-14 DIAGNOSIS — Z95810 Presence of automatic (implantable) cardiac defibrillator: Secondary | ICD-10-CM

## 2016-12-14 DIAGNOSIS — R440 Auditory hallucinations: Secondary | ICD-10-CM

## 2016-12-14 DIAGNOSIS — I481 Persistent atrial fibrillation: Secondary | ICD-10-CM

## 2016-12-14 DIAGNOSIS — I5043 Acute on chronic combined systolic (congestive) and diastolic (congestive) heart failure: Secondary | ICD-10-CM

## 2016-12-14 LAB — GLUCOSE, CAPILLARY
GLUCOSE-CAPILLARY: 123 mg/dL — AB (ref 65–99)
Glucose-Capillary: 78 mg/dL (ref 65–99)
Glucose-Capillary: 111 mg/dL — ABNORMAL HIGH (ref 65–99)
Glucose-Capillary: 122 mg/dL — ABNORMAL HIGH (ref 65–99)

## 2016-12-14 LAB — BASIC METABOLIC PANEL
Anion gap: 10 (ref 5–15)
BUN: 43 mg/dL — AB (ref 6–20)
CHLORIDE: 88 mmol/L — AB (ref 101–111)
CO2: 45 mmol/L — AB (ref 22–32)
CREATININE: 1.95 mg/dL — AB (ref 0.61–1.24)
Calcium: 8.4 mg/dL — ABNORMAL LOW (ref 8.9–10.3)
GFR calc Af Amer: 41 mL/min — ABNORMAL LOW (ref >60)
GFR calc non Af Amer: 35 mL/min — ABNORMAL LOW (ref >60)
Glucose, Bld: 80 mg/dL (ref 65–99)
POTASSIUM: 3.6 mmol/L (ref 3.5–5.1)
SODIUM: 143 mmol/L (ref 135–145)

## 2016-12-14 LAB — CBC
HEMATOCRIT: 29.7 % — AB (ref 39.0–52.0)
HEMATOCRIT: 35.2 % — AB (ref 39.0–52.0)
HEMOGLOBIN: 10.1 g/dL — AB (ref 13.0–17.0)
Hemoglobin: 8.7 g/dL — ABNORMAL LOW (ref 13.0–17.0)
MCH: 30.2 pg (ref 26.0–34.0)
MCH: 29.6 pg (ref 26.0–34.0)
MCHC: 29.3 g/dL — ABNORMAL LOW (ref 30.0–36.0)
MCHC: 28.7 g/dL — AB (ref 30.0–36.0)
MCV: 103.1 fL — AB (ref 78.0–100.0)
MCV: 103.2 fL — ABNORMAL HIGH (ref 78.0–100.0)
PLATELETS: 186 10*3/uL (ref 150–400)
Platelets: 242 10*3/uL (ref 150–400)
RBC: 2.88 MIL/uL — AB (ref 4.22–5.81)
RBC: 3.41 MIL/uL — ABNORMAL LOW (ref 4.22–5.81)
RDW: 15.4 % (ref 11.5–15.5)
RDW: 15.3 % (ref 11.5–15.5)
WBC: 6.5 10*3/uL (ref 4.0–10.5)
WBC: 8.3 10*3/uL (ref 4.0–10.5)

## 2016-12-14 LAB — BLOOD GAS, ARTERIAL
Acid-Base Excess: 19.2 mmol/L — ABNORMAL HIGH (ref 0.0–2.0)
BICARBONATE: 38.9 mmol/L — AB (ref 20.0–28.0)
DELIVERY SYSTEMS: POSITIVE
Drawn by: 21310
EXPIRATORY PAP: 8
FIO2: 45
Inspiratory PAP: 14
O2 Saturation: 92.6 %
PATIENT TEMPERATURE: 37
PO2 ART: 76.1 mmHg — AB (ref 83.0–108.0)
pCO2 arterial: 96.7 mmHg (ref 32.0–48.0)
pH, Arterial: 7.303 — ABNORMAL LOW (ref 7.350–7.450)

## 2016-12-14 LAB — PROTIME-INR
INR: 2.46
PROTHROMBIN TIME: 26.5 s — AB (ref 11.4–15.2)

## 2016-12-14 MED ORDER — HALOPERIDOL LACTATE 5 MG/ML IJ SOLN
5.0000 mg | Freq: Four times a day (QID) | INTRAMUSCULAR | Status: DC | PRN
  Administered 2016-12-16 (×2): 5 mg via INTRAMUSCULAR
  Filled 2016-12-14 (×2): qty 1

## 2016-12-14 MED ORDER — HALOPERIDOL LACTATE 5 MG/ML IJ SOLN
2.0000 mg | Freq: Four times a day (QID) | INTRAMUSCULAR | Status: DC | PRN
  Administered 2016-12-14: 2 mg via INTRAVENOUS
  Filled 2016-12-14: qty 1

## 2016-12-14 NOTE — BH Assessment (Signed)
[facility] Assessment Progress Note  Clinician called ICU staff to gain update on pt's medical status to see if daily reassessment for psych is still warranted. Clinician told by [facility] that pt is still on continuous bipap and a decision is being made to intubate pt. Clinician advised [facility] that pt will be taken off of list for daily psych reassessment due to his compromised medical state.   Please call TTS at [phone] for psych reassessment once pt is medically stable.   Kenna Gilbert. Lovena Le, Lorena, [place], LPCA Counselor

## 2016-12-14 NOTE — Progress Notes (Signed)
Patient has sever sleep apnea and does not ventilate well with BiPAP. This is most likely because of his weight. He also tends to retain because of his poor ventilation. Will continue to adjust BiPAP to possible ventilate patient better. He has never smoked. He has to be sedated because of his Psychos.

## 2016-12-14 NOTE — Progress Notes (Signed)
Pt became increasingly agitated, attempted to get OOB; did get to bottom of bed and began fighting with sitter. 3 RNS and sitter got pt back into bed. Pt verbally and physically aggressive with staff. 1 dose of PRN ativan given per order. Will continue to monitor.

## 2016-12-14 NOTE — Plan of Care (Signed)
Problem: Pain Managment: Goal: General experience of comfort will improve Outcome: Progressing Pt pain controlled with PRN pain medication. No new complaints about discomfort or pain.

## 2016-12-14 NOTE — Progress Notes (Signed)
[facility]  PROGRESS NOTE  Darrell Black QAS:[id number] DOB: [birth year] DOA: 12/10/2016 PCP: Renata Caprice, DO   Brief HPI:   61 year old morbidly obese male with OSA/OHS, chronic kidney disease stage III (baseline unknown), chronic combined systolic and diastolic CHF, A. fib on anticoagulation, coronary artery disease, ischemic cardiomyopathy with history of AICD and CABG, multiple cardiac stent (reportedly last cath in 02/2016) type 2 diabetes mellitus, hypertension, hyperlipidemia, denies muscle weakness, depression, peripheral neuropathy, sick euthyroid syndrome and restless leg syndrome was brought to the ED for progressive visual hallucination for past 4 days. Patient reports moving from [place] about 4 months back to the area. He reported being started on antidepressant in June following which she started having mild visual hallucinations, spiders crawling on the walls and curtains. Symptoms started to get worse and his antidepressant was dropped 5 days back but his symptoms were systemically worse. He was brought to the ED and was awaiting to get transfer to psych facility but started having shortness of breath. He also started bleeding from the his gums. He was given tranexemic acid and multiple suctioning letting which the bleeding has improved. In the ED he was found to be short of breath with cough and wheezing. ABG done showed pH of 7.32, PCO2 of 84, PO2 of 60 and bicarbonate of 37.8. CBC showed normal WBC, hemoglobin of 9.7 and platelets of 225. INR was 1.91, chemistry showed CO2 of 40, BUN of 36 and creatinine of 2.05. Ammonia of 48    Subjective   This morning patient continues to be somnolent, though wakes up on verbal stimulation. Currently on BiPAP ABG today shows worsening of PCO2 96.7. PH 7.303   Assessment/Plan:     1. Acute respiratory failure with hypoxia and hypercapnia- multifactorial with CHF exacerbation, obstructive sleep apnea, and also medications. Patient was  started on Cogentin, dose of gabapentin increased and also Vistaril started for anxiety. Will hold his medications as patient has been heavily sedated with worsening of PCO2. Continue BiPAP. 2. Acute on chronic combined systolic and diastolic NLG-9-Q echo showed EF 35-40%, diffuse hypokinesis , PA pressure of 38. Currently patient on beta blocker, Lipitor, Aldactone, Bumex. Cardiology following 3. Acute psychosis- patient was seen by psychiatry in the ED and started on Cogentin, Vistaril, dose of gabapentin increased. Patient has been excessively somnolent due to these medications which will be held for now due to worsening PCO2. Will monitor patient off these medications 4. Paroxysmal atrial fibrillation-heart rate is controlled, continue beta blocker and amiodarone. Eliquis is currently on hold as patient came with gum bleeding, 5. Mouth bleeding-resolved after patient was given tranexemic acid. Patient has continued bleeding and hematuria. Anticoagulations and antiplatelet therapy on hold. 6. Coronary artery disease-status post CABG, stents. Continue beta blocker, Lipitor, Imdur. 7. Diabetes mellitus-continue sliding scale insulin with NovoLog. Hemoglobin A1c is 5.4 8. Restless leg syndrome-will hold Requip at this time as patient is heavily sedated 9. Morbid obesity with OSA/OHS-patient will need sleep study as outpatient.    DVT prophylaxis: SCDs  Code Status: SCDs  Family Communication: No family at bedside   Disposition Plan: Pending improvement in patients condition   Consultants:  None  Procedures:  None  Continuous infusions     Antibiotics:   Anti-infectives    Start     Dose/Rate Route Frequency Ordered Stop   12/11/16 0000  sulfamethoxazole-trimethoprim (BACTRIM DS,SEPTRA DS) 800-160 MG per tablet 1 tablet  Status:  Discontinued     1 tablet Oral 2 times daily 12/10/16 2350  12/12/16 0700       Objective   Vitals:   12/14/16 0801 12/14/16 0900 12/14/16 0912  12/14/16 1000  BP:  (!) 92/51  111/84  Pulse: 80 100  (!) 105  Resp: (!) 24 (!) 28  (!) 25  Temp:   98.4 F (36.9 C)   TempSrc:   Axillary   SpO2: 93% 93%  (!) 88%  Weight:      Height:        Intake/Output Summary (Last 24 hours) at 12/14/16 1110 Last data filed at 12/14/16 0912  Gross per 24 hour  Intake                0 ml  Output             2000 ml  Net            -2000 ml   Filed Weights   12/12/16 0140 12/13/16 0500 12/14/16 0400  Weight: 119.3 kg (263 lb) (!) 147.1 kg (324 lb 4.8 oz) (!) 146 kg (321 lb 14 oz)     Physical Examination:   Physical Exam:   Neck: Supple, no deformities, masses, or tenderness Lungs: Normal respiratory effort, decreased breath sounds bilaterally Heart: Regular rate and rhythm, S1 and S2 normal, no murmurs, rubs auscultated Abdomen: BS normoactive,soft,nondistended,non-tender to palpation,no organomegaly Extremities: No pretibial edema, no erythema, no cyanosis, no clubbing Neuro : Somnolent but arousable Skin: No rashes seen on exam    Data Reviewed: I have personally reviewed following labs and imaging studies  CBG:  Recent Labs Lab 12/13/16 0815 12/13/16 1140 12/13/16 1638 12/13/16 2140 12/14/16 0912  GLUCAP 90 99 101* 114* 78    CBC:  Recent Labs Lab 12/10/16 2143 12/11/16 1839 12/12/16 0809 12/12/16 1630 12/13/16 0358 12/13/16 1544 12/14/16 0355  WBC 6.4 6.0 8.1  --   --   --  6.5  NEUTROABS 4.9 4.7 6.9  --   --   --   --   HGB 9.7* 9.7* 10.0* 9.8* 9.3* 9.5* 8.7*  HCT 31.5* 32.4* 33.2* 32.2* 31.6* 32.6* 29.7*  MCV 95.5 97.9 98.5  --   --   --  103.1*  PLT 234 225 221  --   --   --  086    Basic Metabolic Panel:  Recent Labs Lab 12/10/16 2143 12/11/16 1839 12/12/16 0809 12/13/16 0358 12/14/16 0355  NA 137 139 139 142 143  K 3.7 3.7 3.7 3.7 3.6  CL 88* 89* 90* 88* 88*  CO2 37* 40* 42* 45* 45*  GLUCOSE 114* 115* 105* 103* 80  BUN 34* 36* 35* 39* 43*  CREATININE 2.01* 2.05* 1.90* 1.98* 1.95*   CALCIUM 8.6* 8.5* 8.5* 8.5* 8.4*  MG  --   --  1.5* 1.9  --   PHOS  --   --  4.4  --   --     Recent Results (from the past 240 hour(s))  MRSA PCR Screening     Status: None   Collection Time: 12/12/16  6:47 AM  Result Value Ref Range Status   MRSA by PCR NEGATIVE NEGATIVE Final    Comment:        The GeneXpert MRSA Assay (FDA approved for NASAL specimens only), is one component of a comprehensive MRSA colonization surveillance program. It is not intended to diagnose MRSA infection nor to guide or monitor treatment for MRSA infections.      Liver Function Tests:  Recent Labs Lab 12/12/16 0809  AST 29  ALT 23  ALKPHOS 149*  BILITOT 1.4*  PROT 7.2  ALBUMIN 3.7   No results for input(s): LIPASE, AMYLASE in the last 168 hours.  Recent Labs Lab 12/11/16 1839  AMMONIA 48*    Cardiac Enzymes: No results for input(s): CKTOTAL, CKMB, CKMBINDEX, TROPONINI in the last 168 hours. BNP (last 3 results)  Recent Labs  12/11/16 2330  BNP 583.0*    ProBNP (last 3 results) No results for input(s): PROBNP in the last 8760 hours.    Studies: No results found.  Scheduled Meds: . amiodarone  200 mg Oral Daily  . apixaban  5 mg Oral BID  . atorvastatin  80 mg Oral QPM  . bumetanide  2 mg Oral BID  . carvedilol  3.125 mg Oral BID WC  . chlorhexidine  15 mL Mouth Rinse BID  . docusate sodium  100 mg Oral Daily  . gabapentin  100 mg Oral BID  . insulin aspart  0-20 Units Subcutaneous TID WC  . ipratropium-albuterol  2.5 mg Nebulization Q6H  . ipratropium-albuterol  3 mL Nebulization Once  . isosorbide mononitrate  60 mg Oral Daily  . lactulose  20 g Oral BID  . magnesium oxide  400 mg Oral Daily  . mouth rinse  15 mL Mouth Rinse q12n4p  . risperiDONE  0.5 mg Oral QHS  . rOPINIRole  1 mg Oral QHS  . spironolactone  12.5 mg Oral Daily  . tiotropium  18 mcg Inhalation Daily      Time spent:60 min  [facility]   [facility] Pager [phone]. If  7PM-7AM, please contact night-coverage at [email or web address], Office  [phone]  password Slater  12/14/2016, 11:10 AM  LOS: 2 days

## 2016-12-14 NOTE — Progress Notes (Signed)
CRITICAL VALUE ALERT  Critical Value:  ABG Critical Results pH-7.303; CO2-96.7; pO2-76.1; Bicarb-38.9  Date & Time Notied:  12/14/2016 6861

## 2016-12-14 NOTE — Progress Notes (Signed)
Progress Note  Patient Name: Darrell Black Date of Encounter: 12/14/2016  Primary Cardiologist: Dr. Vernon Prey [facility])  Subjective   On BiPAP, sitter in room. Says breathing is better, no complaints.   Inpatient Medications    Scheduled Meds: . amiodarone  200 mg Oral Daily  . apixaban  5 mg Oral BID  . atorvastatin  80 mg Oral QPM  . benztropine  0.5 mg Oral BID  . bumetanide  2 mg Oral BID  . carvedilol  3.125 mg Oral BID WC  . chlorhexidine  15 mL Mouth Rinse BID  . docusate sodium  100 mg Oral Daily  . gabapentin  100 mg Oral BID  . gabapentin  200 mg Oral QHS  . insulin aspart  0-20 Units Subcutaneous TID WC  . ipratropium-albuterol  2.5 mg Nebulization Q6H  . ipratropium-albuterol  3 mL Nebulization Once  . isosorbide mononitrate  60 mg Oral Daily  . lactulose  20 g Oral BID  . magnesium oxide  400 mg Oral Daily  . mouth rinse  15 mL Mouth Rinse q12n4p  . risperiDONE  0.5 mg Oral QHS  . rOPINIRole  1 mg Oral QHS  . spironolactone  12.5 mg Oral Daily  . tiotropium  18 mcg Inhalation Daily   Continuous Infusions:  PRN Meds: acetaminophen, albuterol, hydrOXYzine, LORazepam, nitroGLYCERIN, ondansetron **OR** ondansetron (ZOFRAN) IV, oxyCODONE   Vital Signs    Vitals:   12/14/16 0416 12/14/16 0500 12/14/16 0600 12/14/16 0801  BP:  103/69 110/64   Pulse: 92 90 83 80  Resp: (!) 21 20 16  (!) 24  Temp:      TempSrc:      SpO2: 94% 94% 96% 93%  Weight:      Height:        Intake/Output Summary (Last 24 hours) at 12/14/16 0818 Last data filed at 12/14/16 0408  Gross per 24 hour  Intake              100 ml  Output             1600 ml  Net            -1500 ml   Filed Weights   12/12/16 0140 12/13/16 0500 12/14/16 0400  Weight: 263 lb (119.3 kg) (!) 324 lb 4.8 oz (147.1 kg) (!) 321 lb 14 oz (146 kg)    Telemetry    Rate controlled atrial fibrillation - Personally Reviewed  ECG    None today  Physical Exam   GEN: No acute distress.  Morbidly  obese, chronically-ill appearing male. Neck: Due to body habitus, unable to assess JVP. Cardiac: Irregular rhythm, no murmurs, rubs, or gallops.  Respiratory: Diminished anteriorly, on BiPAP GI: Morbidly obese, nontender MS: Chronic lower extremity edema, but shows improvement from 10/3 Neuro:  Nonfocal  Psych: Normal affect   Labs    Chemistry Recent Labs Lab 12/12/16 0809 12/13/16 0358 12/14/16 0355  NA 139 142 143  K 3.7 3.7 3.6  CL 90* 88* 88*  CO2 42* 45* 45*  GLUCOSE 105* 103* 80  BUN 35* 39* 43*  CREATININE 1.90* 1.98* 1.95*  CALCIUM 8.5* 8.5* 8.4*  PROT 7.2  --   --   ALBUMIN 3.7  --   --   AST 29  --   --   ALT 23  --   --   ALKPHOS 149*  --   --   BILITOT 1.4*  --   --   [facility]  36* 35* 35*  GFRAA 42* 40* 41*  ANIONGAP 7 9 10      Hematology Recent Labs Lab 12/11/16 1839 12/12/16 0809  12/13/16 0358 12/13/16 1544 12/14/16 0355  WBC 6.0 8.1  --   --   --  6.5  RBC 3.31* 3.37*  --   --   --  2.88*  HGB 9.7* 10.0*  < > 9.3* 9.5* 8.7*  HCT 32.4* 33.2*  < > 31.6* 32.6* 29.7*  MCV 97.9 98.5  --   --   --  103.1*  MCH 29.3 29.7  --   --   --  30.2  MCHC 29.9* 30.1  --   --   --  29.3*  RDW 15.1 15.1  --   --   --  15.4  PLT 225 221  --   --   --  186  < > = values in this interval not displayed.  Cardiac EnzymesNo results for input(s): TROPONINI in the last 168 hours. No results for input(s): TROPIPOC in the last 168 hours.   BNP Recent Labs Lab 12/11/16 2330  BNP 583.0*     DDimer No results for input(s): DDIMER in the last 168 hours.   Radiology    No results found.  Cardiac Studies   Echocardiogram 12/12/2016: Study Conclusions  - Procedure narrative: Contrast enhancement employed. - Left ventricle: The cavity size was normal. Wall thickness was increased in a pattern of mild LVH. Systolic function was moderately reduced. The estimated ejection fraction was in the range of 35% to 40%. Diffuse hypokinesis. The study was  not technically sufficient to allow evaluation of LV diastolic dysfunction due to atrial fibrillation. - Regional wall motion abnormality: Akinesis of the apical septal and apical myocardium; moderate hypokinesis of the basal-mid anteroseptal myocardium. - Aortic valve: Mildly calcified leaflets. - Mitral valve: Moderately calcified annulus. Normal thickness leaflets . - Left atrium: The atrium was mildly dilated. - Right ventricle: Pacer wire or catheter noted in right ventricle. Systolic function was mildly reduced. - Tricuspid valve: There was mild regurgitation. - Pulmonary arteries: PA peak pressure: 38 mm Hg (S). - Systemic veins: IVC is dilated wtih normal respiratory variation. Estimated CVP 8 mmHg.  Patient Profile     61 y.o. male with a history of ischemic cardiomyopathy, CAD status post previous PCI and CABG, persistent atrial fibrillation, obstructive sleep apnea, seen Jude ICD in place, and acute on chronic renal insufficiency. Based on recent records from [facility], patient's LVEF was documented to be 30% and he has had recent heart failure symptoms as well as intermittent chest pain. Reportedly, he had been told based on previous workup in [place] back in December 2017 that there were no clear revascularization options in terms PCI, although no records are available to support this at present.  Assessment & Plan    1. Acute on chronic systolic heart failure. Patient currently on Bumex and Aldactone with reasonable urine output, 1500 cc net in the last 24 hours. Continue current diuretic regimen.  2. Ischemic cardiomyopathy, LVEF 35-40% based on echocardiogram from 12/12/16, previously assessed at approximately 30% at [facility] in July. He is on Coreg and Aldactone. Presently not on ACE inhibitor or ARB, presumably with history of renal insufficiency and at this point low normal to low blood pressure.  3. Persistent atrial fibrillation. On  Eliquis for stroke prophylaxis. He is also on amiodarone which was present prior to admission, presumably with history of paroxysmal atrial fibrillation although  uncertain if he has had any VT documented. Heart rate is adequately controlled at this time. If Hgb continues to drop, may need to temporarily hold Eliquis.  4. St. Jude ICD in place.  5. OSA on CPAP.  6. Morbid obesity.  7. Acute on chronic renal insufficiency with baseline CKD stage 3. Creatinine currently 1.95. Potassium normal.  8. Anemia, baseline uncertain. Current hemoglobin 8.7 down from 9.5. If this continues to drop, may need to temporarily hold Eliquis.  9. Hyperlipidemia, on Lipitor.  10. CAD with CABG and multiple PCI's: Continue Coreg, Lipitor, and nitrates. ASA and Plavix on hold.    For questions or updates, please contact [place] Please consult [email or web address] for contact info under Cardiology/STEMI.      Signed, Kate Sable, MD  12/14/2016, 8:18 AM

## 2016-12-15 ENCOUNTER — Inpatient Hospital Stay (HOSPITAL_COMMUNITY): Payer: Medicaid Other

## 2016-12-15 DIAGNOSIS — I5042 Chronic combined systolic (congestive) and diastolic (congestive) heart failure: Secondary | ICD-10-CM

## 2016-12-15 DIAGNOSIS — N172 Acute kidney failure with medullary necrosis: Secondary | ICD-10-CM

## 2016-12-15 DIAGNOSIS — I5023 Acute on chronic systolic (congestive) heart failure: Secondary | ICD-10-CM | POA: Diagnosis present

## 2016-12-15 DIAGNOSIS — I5021 Acute systolic (congestive) heart failure: Secondary | ICD-10-CM | POA: Insufficient documentation

## 2016-12-15 DIAGNOSIS — J9622 Acute and chronic respiratory failure with hypercapnia: Secondary | ICD-10-CM | POA: Insufficient documentation

## 2016-12-15 DIAGNOSIS — E119 Type 2 diabetes mellitus without complications: Secondary | ICD-10-CM

## 2016-12-15 DIAGNOSIS — I25708 Atherosclerosis of coronary artery bypass graft(s), unspecified, with other forms of angina pectoris: Secondary | ICD-10-CM

## 2016-12-15 DIAGNOSIS — N183 Chronic kidney disease, stage 3 unspecified: Secondary | ICD-10-CM

## 2016-12-15 DIAGNOSIS — R441 Visual hallucinations: Secondary | ICD-10-CM

## 2016-12-15 DIAGNOSIS — E7220 Disorder of urea cycle metabolism, unspecified: Secondary | ICD-10-CM

## 2016-12-15 DIAGNOSIS — J9621 Acute and chronic respiratory failure with hypoxia: Secondary | ICD-10-CM

## 2016-12-15 DIAGNOSIS — I482 Chronic atrial fibrillation: Secondary | ICD-10-CM

## 2016-12-15 DIAGNOSIS — R0689 Other abnormalities of breathing: Secondary | ICD-10-CM

## 2016-12-15 DIAGNOSIS — G4733 Obstructive sleep apnea (adult) (pediatric): Secondary | ICD-10-CM

## 2016-12-15 LAB — GLUCOSE, CAPILLARY
GLUCOSE-CAPILLARY: 90 mg/dL (ref 65–99)
GLUCOSE-CAPILLARY: 112 mg/dL — AB (ref 65–99)
Glucose-Capillary: 112 mg/dL — ABNORMAL HIGH (ref 65–99)
Glucose-Capillary: 79 mg/dL (ref 65–99)
Glucose-Capillary: 85 mg/dL (ref 65–99)

## 2016-12-15 LAB — POCT I-STAT 3, ART BLOOD GAS (G3+)
ACID-BASE EXCESS: 25 mmol/L — AB (ref 0.0–2.0)
Bicarbonate: 53.4 mmol/L — ABNORMAL HIGH (ref 20.0–28.0)
O2 SAT: 96 %
PCO2 ART: 83.6 mmHg — AB (ref 32.0–48.0)
PH ART: 7.414 (ref 7.350–7.450)
Patient temperature: 98.8
TCO2: >50 mmol/L — ABNORMAL HIGH (ref 22–32)
pO2, Arterial: 86 mmHg (ref 83.0–108.0)

## 2016-12-15 LAB — CBC WITH DIFFERENTIAL/PLATELET
BASOS ABS: 0 10*3/uL (ref 0.0–0.1)
BASOS PCT: 0 %
EOS ABS: 0.1 10*3/uL (ref 0.0–0.7)
EOS PCT: 2 %
HCT: 30.9 % — ABNORMAL LOW (ref 39.0–52.0)
Hemoglobin: 8.7 g/dL — ABNORMAL LOW (ref 13.0–17.0)
LYMPHS PCT: 9 %
Lymphs Abs: 0.5 10*3/uL — ABNORMAL LOW (ref 0.7–4.0)
MCH: 28.4 pg (ref 26.0–34.0)
MCHC: 28.2 g/dL — ABNORMAL LOW (ref 30.0–36.0)
MCV: 101 fL — AB (ref 78.0–100.0)
Monocytes Absolute: 0.5 10*3/uL (ref 0.1–1.0)
Monocytes Relative: 9 %
Neutro Abs: 4.4 10*3/uL (ref 1.7–7.7)
Neutrophils Relative %: 80 %
PLATELETS: 180 10*3/uL (ref 150–400)
RBC: 3.06 MIL/uL — AB (ref 4.22–5.81)
RDW: 15.3 % (ref 11.5–15.5)
WBC: 5.5 10*3/uL (ref 4.0–10.5)

## 2016-12-15 LAB — COMPREHENSIVE METABOLIC PANEL
ALBUMIN: 3.1 g/dL — AB (ref 3.5–5.0)
ALBUMIN: 2.9 g/dL — AB (ref 3.5–5.0)
ALT: 91 U/L — AB (ref 17–63)
ALT: 84 U/L — AB (ref 17–63)
AST: 157 U/L — AB (ref 15–41)
AST: 112 U/L — AB (ref 15–41)
Alkaline Phosphatase: 176 U/L — ABNORMAL HIGH (ref 38–126)
Alkaline Phosphatase: 166 U/L — ABNORMAL HIGH (ref 38–126)
Anion gap: 14 (ref 5–15)
Anion gap: 8 (ref 5–15)
BUN: 51 mg/dL — AB (ref 6–20)
BUN: 43 mg/dL — AB (ref 6–20)
CHLORIDE: 91 mmol/L — AB (ref 101–111)
CHLORIDE: 91 mmol/L — AB (ref 101–111)
CO2: 44 mmol/L — ABNORMAL HIGH (ref 22–32)
CO2: 47 mmol/L — AB (ref 22–32)
CREATININE: 2.2 mg/dL — AB (ref 0.61–1.24)
CREATININE: 1.79 mg/dL — AB (ref 0.61–1.24)
Calcium: 8.7 mg/dL — ABNORMAL LOW (ref 8.9–10.3)
Calcium: 8.4 mg/dL — ABNORMAL LOW (ref 8.9–10.3)
GFR calc Af Amer: 35 mL/min — ABNORMAL LOW (ref >60)
GFR calc Af Amer: 45 mL/min — ABNORMAL LOW (ref >60)
GFR calc non Af Amer: 39 mL/min — ABNORMAL LOW (ref >60)
GFR, EST NON AFRICAN AMERICAN: 31 mL/min — AB (ref >60)
GLUCOSE: 92 mg/dL (ref 65–99)
GLUCOSE: 101 mg/dL — AB (ref 65–99)
POTASSIUM: 4.2 mmol/L (ref 3.5–5.1)
POTASSIUM: 3.7 mmol/L (ref 3.5–5.1)
SODIUM: 146 mmol/L — AB (ref 135–145)
Sodium: 149 mmol/L — ABNORMAL HIGH (ref 135–145)
TOTAL PROTEIN: 6.4 g/dL — AB (ref 6.5–8.1)
Total Bilirubin: 1.7 mg/dL — ABNORMAL HIGH (ref 0.3–1.2)
Total Bilirubin: 1.4 mg/dL — ABNORMAL HIGH (ref 0.3–1.2)
Total Protein: 6.2 g/dL — ABNORMAL LOW (ref 6.5–8.1)

## 2016-12-15 LAB — BLOOD GAS, ARTERIAL
Acid-Base Excess: 20.7 mmol/L — ABNORMAL HIGH (ref 0.0–2.0)
BICARBONATE: 43.3 mmol/L — AB (ref 20.0–28.0)
DELIVERY SYSTEMS: POSITIVE
Drawn by: 105551
Expiratory PAP: 6
FIO2: 40
Inspiratory PAP: 16
LHR: 8 {breaths}/min
O2 Saturation: 91.3 %
PH ART: 7.349 — AB (ref 7.350–7.450)
pCO2 arterial: 88.5 mmHg (ref 32.0–48.0)
pO2, Arterial: 67 mmHg — ABNORMAL LOW (ref 83.0–108.0)

## 2016-12-15 LAB — APTT: APTT: 40 s — AB (ref 24–36)

## 2016-12-15 LAB — CBC
HCT: 30.4 % — ABNORMAL LOW (ref 39.0–52.0)
Hemoglobin: 8.8 g/dL — ABNORMAL LOW (ref 13.0–17.0)
MCH: 29.9 pg (ref 26.0–34.0)
MCHC: 28.9 g/dL — ABNORMAL LOW (ref 30.0–36.0)
MCV: 103.4 fL — AB (ref 78.0–100.0)
Platelets: 194 10*3/uL (ref 150–400)
RBC: 2.94 MIL/uL — ABNORMAL LOW (ref 4.22–5.81)
RDW: 15.2 % (ref 11.5–15.5)
WBC: 6.5 10*3/uL (ref 4.0–10.5)

## 2016-12-15 LAB — AMMONIA
AMMONIA: 21 umol/L (ref 9–35)
Ammonia: 24 umol/L (ref 9–35)

## 2016-12-15 LAB — PROTIME-INR
INR: 1.9
Prothrombin Time: 21.6 seconds — ABNORMAL HIGH (ref 11.4–15.2)

## 2016-12-15 MED ORDER — ALTEPLASE 2 MG IJ SOLR
2.0000 mg | Freq: Once | INTRAMUSCULAR | Status: AC
  Administered 2016-12-15: 2 mg
  Filled 2016-12-15: qty 2

## 2016-12-15 MED ORDER — DEXTROSE 5 % IV SOLN
1.0000 g | INTRAVENOUS | Status: DC
  Administered 2016-12-15: 1 g via INTRAVENOUS
  Filled 2016-12-15 (×3): qty 10

## 2016-12-15 MED ORDER — ACETAMINOPHEN 325 MG PO TABS
650.0000 mg | ORAL_TABLET | Freq: Four times a day (QID) | ORAL | Status: DC | PRN
  Administered 2016-12-18 – 2016-12-30 (×15): 650 mg via ORAL
  Filled 2016-12-15 (×17): qty 2

## 2016-12-15 MED ORDER — DEXTROSE-NACL 5-0.45 % IV SOLN
INTRAVENOUS | Status: DC
  Administered 2016-12-15: 14:00:00 via INTRAVENOUS

## 2016-12-15 MED ORDER — FUROSEMIDE 10 MG/ML IJ SOLN
80.0000 mg | Freq: Two times a day (BID) | INTRAMUSCULAR | Status: DC
  Administered 2016-12-15 – 2016-12-16 (×2): 80 mg via INTRAVENOUS
  Filled 2016-12-15 (×3): qty 8

## 2016-12-15 MED ORDER — PANTOPRAZOLE SODIUM 40 MG IV SOLR
40.0000 mg | INTRAVENOUS | Status: DC
  Administered 2016-12-15 – 2016-12-18 (×4): 40 mg via INTRAVENOUS
  Filled 2016-12-15 (×5): qty 40

## 2016-12-15 MED ORDER — DEXTROSE-NACL 5-0.45 % IV SOLN: INTRAVENOUS | Status: DC

## 2016-12-15 MED ORDER — VITAMIN K1 10 MG/ML IJ SOLN
5.0000 mg | Freq: Once | INTRAVENOUS | Status: AC
  Administered 2016-12-15: 5 mg via INTRAVENOUS
  Filled 2016-12-15: qty 0.5

## 2016-12-15 MED ORDER — ALTEPLASE 2 MG IJ SOLR
2.0000 mg | INTRAMUSCULAR | Status: AC
  Administered 2016-12-15: 2 mg
  Filled 2016-12-15: qty 2

## 2016-12-15 NOTE — Progress Notes (Signed)
eLink Physician-Brief Progress Note Patient Name: Darrell Black DOB: [birth year] MRN: [id number]   Date of Service  12/15/2016  HPI/Events of Note  61 yo male transferred from [place] with GI Bleed on Eliquis and CHF exacerbation. Now on BiPAP. BP = 107/72 and HR = 79. Sat = 96% and RR = 25.   eICU Interventions  Will order: 1. CBC with platelets, CMP, PT/INR, PTT, ABG and Ammonia level STAT.  2. Ground team informed of admission.      Intervention Category Evaluation Type: New Patient Evaluation  Lysle Dingwall 12/15/2016, 8:37 PM

## 2016-12-15 NOTE — Progress Notes (Signed)
Pt currently having active bleeding from rectum. It is continuous. Patient passed large clot through rectum. Hgb currently stable. Will continue to monitor. MD notified  Pt lost both IV's. 3 RN's made 7 attempts. 1 IV obtained in R forearm. MD notified. PICC order placed. PICC team will be here in AM (12-15-2016).  Margaret Pyle, RN

## 2016-12-15 NOTE — H&P (Signed)
PULMONARY / CRITICAL CARE MEDICINE   Name: Darrell Black MRN: [id number] DOB: 10/13/1955    ADMISSION DATE:  12/10/2016 CONSULTATION DATE:  12/15/2016  REFERRING MD:  OSH  CHIEF COMPLAINT:  GI bleeding  HISTORY OF PRESENT ILLNESS:   61 y.o. male with a history of ischemic cardiomyopathy, CAD status post previous PCI and CABG, persistent atrial fibrillation, obstructive sleep apnea, seen Jude ICD in place, and acute on chronic renal insufficiency who was admitted for hallucination and was transferred from [facility] due to lower GI bleeding on elequis given vitamin k and TXA there. He has been requiring BIPAP and has been encephalopathic.  Patient is not able to give any history.    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  PAST SURGICAL HISTORY: He  has no past surgical history on file.  Allergies  Allergen Reactions  . Ampicillin Rash  . Clindamycin/Lincomycin Rash  . Penicillins Rash    Has patient had a PCN reaction causing immediate rash, facial/tongue/throat swelling, SOB or lightheadedness with hypotension: Yes Has patient had a PCN reaction causing severe rash involving mucus membranes or skin necrosis: No Has patient had a PCN reaction that required hospitalization: No Has patient had a PCN reaction occurring within the last 10 years: No If all of the above answers are "NO", then  may proceed with Cephalosporin use.     No current facility-administered medications on file prior to encounter.    No current outpatient prescriptions on file prior to encounter.    Current Facility-Administered Medications:  .  acetaminophen (TYLENOL) tablet 650 mg, 650 mg, Oral, Q6H PRN, Opyd, Timothy S, MD .  albuterol (PROVENTIL) (2.5 MG/3ML) 0.083% nebulizer solution 2.5 mg, 2.5 mg, Nebulization, Q2H PRN, Reubin Milan, MD .  amiodarone (PACERONE) tablet 200 mg, 200 mg, Oral, Daily, Plunkett, Whitney, MD, 200 mg at 12/14/16 1033 .  atorvastatin (LIPITOR) tablet 80 mg, 80 mg, Oral, QPM, Plunkett, Whitney, MD, 80 mg at 12/13/16 1704 .  carvedilol (COREG) tablet 3.125 mg, 3.125 mg, Oral, BID WC, Plunkett, Whitney, MD, 3.125 mg at 12/14/16 1034 .  cefTRIAXone (ROCEPHIN) 1 g in dextrose 5 % 50 mL IVPB, 1 g, Intravenous, Q24H, Darrick Meigs, Marge Duncans, MD, Stopped at 12/15/16 1338 .  chlorhexidine (PERIDEX) 0.12 % solution 15 mL, 15 mL, Mouth Rinse, BID, Dhungel, Nishant, MD, 15 mL at 12/14/16 2308 .  dextrose 5 %-0.45 % sodium chloride infusion, , Intravenous, Continuous, Darrick Meigs, Marge Duncans, MD, Last Rate: 100 mL/hr at 12/15/16 2000 .  docusate sodium (COLACE) capsule 100 mg, 100 mg, Oral, Daily, Plunkett, Whitney, MD, 100 mg at 12/14/16 1033 .  furosemide (LASIX) injection 80 mg, 80 mg, Intravenous, Q12H, Aljishi, Wael Z, MD .  haloperidol lactate (HALDOL) injection 2 mg, 2 mg, Intravenous, Q6H PRN, Darrick Meigs, Marge Duncans, MD, 2 mg at 12/14/16 1926 .  haloperidol lactate (HALDOL) injection 5 mg, 5 mg, Intramuscular, Q6H PRN, Opyd, Timothy S, MD .  insulin aspart (novoLOG) injection 0-20 Units, 0-20 Units, Subcutaneous, TID WC, Reubin Milan, MD, 3 Units at 12/12/16 1243 .  ipratropium-albuterol (  DUONEB) 0.5-2.5 (3) MG/3ML nebulizer solution 2.5 mg, 2.5 mg, Nebulization, Q6H, Reubin Milan, MD, 3 mg at 12/15/16 1940 .  ipratropium-albuterol (DUONEB) 0.5-2.5 (3) MG/3ML nebulizer solution 3 mL, 3 mL,  Nebulization, Once, Nat Christen, MD .  isosorbide mononitrate (IMDUR) 24 hr tablet 60 mg, 60 mg, Oral, Daily, Plunkett, Whitney, MD, 60 mg at 12/14/16 1033 .  lactulose (CHRONULAC) 10 GM/15ML solution 20 g, 20 g, Oral, BID, Reubin Milan, MD, Stopped at 12/14/16 2200 .  magnesium oxide (MAG-OX) tablet 400 mg, 400 mg, Oral, Daily, Plunkett, Whitney, MD, 400 mg at 12/14/16 1000 .  MEDLINE mouth rinse, 15 mL, Mouth Rinse, q12n4p, Dhungel, Nishant, MD, 15 mL at 12/15/16 1619 .  nitroGLYCERIN (NITROSTAT) SL tablet 0.4 mg, 0.4 mg, Sublingual, Q5 min PRN, Reubin Milan, MD .  ondansetron [facility]) tablet 4 mg, 4 mg, Oral, Q6H PRN **OR** ondansetron (ZOFRAN) injection 4 mg, 4 mg, Intravenous, Q6H PRN, Reubin Milan, MD .  pantoprazole (PROTONIX) injection 40 mg, 40 mg, Intravenous, Vassie Loll, MD, 40 mg at 12/15/16 1615 .  risperiDONE (RISPERDAL) tablet 0.5 mg, 0.5 mg, Oral, QHS, [place], Justina A, NP, 0.5 mg at 12/13/16 2054 .  tiotropium (SPIRIVA) inhalation capsule 18 mcg, 18 mcg, Inhalation, Daily, Blanchie Dessert, MD, 18 mcg at 12/11/16 1007  FAMILY HISTORY:  His has no family status information on file.    SOCIAL HISTORY: He  reports that he has never smoked. He has never used smokeless tobacco.  REVIEW OF SYSTEMS:   Could not be obtained due to altered mental status   VITAL SIGNS: BP 111/75   Pulse 85   Temp 98.8 F (37.1 C) (Oral)   Resp (!) 26   Ht 5\' 8"  (1.727 m)   Wt (!) 182.3 kg (402 lb)   SpO2 97%   BMI 61.12 kg/m   HEMODYNAMICS:    VENTILATOR SETTINGS: Vent Mode: BIPAP;PCV FiO2 (%):  [40 %-50 %] 45 % Set Rate:  [10 bmp] 10 bmp PEEP:  [10 cmH20] 10 cmH20  INTAKE / OUTPUT: I/O last 3 completed shifts: In: 62 [P.O.:120; I.V.:550; IV Piggyback:50] Out: 2300 [Urine:2300]  PHYSICAL EXAMINATION: General:  Lethargic not in distress Neuro:  Lethargic not following commands moving all of his extremities  HEENT:  Moist  Cardiovascular:   Normal heart sounds no murmurs Lungs: crackles bilaterally  Abdomen:  Morbidly obese soft not tender  Musculoskeletal:  + edema  Skin: left leg cellulitis   LABS:  BMET  Recent Labs Lab 12/13/16 0358 12/14/16 0355 12/15/16 0449  NA 142 143 149*  K 3.7 3.6 4.2  CL 88* 88* 91*  CO2 45* 45* 44*  BUN 39* 43* 51*  CREATININE 1.98* 1.95* 2.20*  GLUCOSE 103* 80 92    Electrolytes  Recent Labs Lab 12/12/16 0809 12/13/16 0358 12/14/16 0355 12/15/16 0449  CALCIUM 8.5* 8.5* 8.4* 8.7*  MG 1.5* 1.9  --   --   PHOS 4.4  --   --   --     CBC  Recent Labs Lab 12/14/16 0355 12/14/16 1944 12/15/16 0449  WBC 6.5 8.3 6.5  HGB 8.7* 10.1* 8.8*  HCT 29.7* 35.2* 30.4*  PLT 186 242 194    Coag's  Recent Labs Lab 12/11/16 1839 12/14/16 1944  INR 1.91 2.46    Sepsis Markers No results for input(s): LATICACIDVEN, PROCALCITON, O2SATVEN in the last 168 hours.  ABG  Recent Labs Lab 12/13/16 0815 12/14/16 0435 12/15/16 0510  PHART 7.340* 7.303*  7.349*  PCO2ART 86.6* 96.7* 88.5*  PO2ART 67.5* 76.1* 67.0*    Liver Enzymes  Recent Labs Lab 12/12/16 0809 12/15/16 0449  AST 29 157*  ALT 23 91*  ALKPHOS 149* 176*  BILITOT 1.4* 1.7*  ALBUMIN 3.7 3.1*    Cardiac Enzymes No results for input(s): TROPONINI, PROBNP in the last 168 hours.  Glucose  Recent Labs Lab 12/14/16 1212 12/14/16 1656 12/14/16 2202 12/15/16 0749 12/15/16 1140 12/15/16 1618  GLUCAP 111* 123* 122* 90 85 112*    Imaging No results found.     ASSESSMENT / PLAN:  Acute Lower GI bleeding on elequis Acute on chronic systolic CHF EF 15% Acute hypoxemic respiratory failure requiring non invasive mechanical ventilation Severe metabolic encephalopathy secondary to hypercapnic respiratory failure and medications  acute on chronic renal failure  morbid obesity Acute blood loss anemia CAD DM with hyperglycemia  Left leg cellulitis  Acute psychosis   Plan: - continue holding  elequis  - stat CBC and serial CBC - GI consult  - stat CXR - stat lasix - d/c IVF  - continue ABx for cellulitis  - continue BIPAP with close observation high risk for intubation  - repeat ABG - d/c gabapentin, oxycodone and ativan   - SSI  - follow urine output and renal function   I have spent 40 mins of CC time bedside or in the unit this time was exclusive of any billable procedures. Patient is needing ICU due to respiratory failure requiring non invasive mechanical ventilation high risk for intubation     FAMILY  - Updates: no family available   - Inter-disciplinary family meet or Palliative Care meeting due by: 12/22/2016    Pulmonary and [place] Pager: [phone]  12/15/2016, 8:48 PM

## 2016-12-15 NOTE — Progress Notes (Signed)
Called report to RN at [facility] who will be taking patient on [facility]M at [facility].

## 2016-12-15 NOTE — Progress Notes (Signed)
Patient's temp 99.4. RN placed ice packs behind neck and under arms. Patient unable to swallow PO tylenol and is experiencing rectal bleeding.  Will continue to monitor.  Margaret Pyle, RN

## 2016-12-15 NOTE — Progress Notes (Signed)
Still having problems ventilating patient with BiPAP. It is  doubtful if it is helping him at all. Suspect he will eventually need to be intubated. Have tried different BiPAP setting's trying to ventilate patient. Don't anticipate morning ABG will be any better.[name]

## 2016-12-15 NOTE — Progress Notes (Signed)
Rcvd from [place] staff w/ assist from tx team to bari bed. Pt tolerated well. Report affirmed at bs.

## 2016-12-15 NOTE — Consult Note (Signed)
Referring Provider: Darrick Meigs Primary Care Physician:  Renata Caprice, DO Primary Gastroenterologist:  unknown  Reason for Consultation:  Hematochezia.   HPI: 61 year old super morbidly obese male with multiple comorbidities admitted to the hospital including ischemic cardiomyopathy with AICD, chronic kidney disease, atrial fibrillation anticoagulated, obstructive sleep apnea admitted secondary to psychosis/involuntary commitment, progressing respiratory failure and lower extremity cellulitis. Noted overnight to have some gross blood per rectum. Has remained hemodynamically stable.  Hemoglobin 9.5  2 days; ago 8.8 this morning.  Eliquis has been held.  No hematemesis. Nursing staff notes blood per rectum has tapered off overnight. Currently on BiPAP. No IV access. PICC placement pending. Patient is unresponsive on BiPAP. Endotracheal intubation may be Administrator, arts. Recently moved from [place]. Unable to obtain a history from the patient. Sketchy history of common bile duct stone previously. No other GI history known.  Past Medical History:  Diagnosis Date  . Atherosclerosis   . Atrial fibrillation ([place])   . 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 Chest pain   . Chronic combined systolic and diastolic heart failure ([place])   . Chronic pain   . Coronary artery disease   . Diabetes mellitus without complication ([place])    type two  . Hyperlipidemia   . Hypertension    essential  . Ischemic cardiomyopathy   . Major depressive disorder, recurrent ([place])   . Morbid obesity due to excess calories ([place])   . Muscle weakness (generalized)   . Neuropathy   . Obstructive sleep apnea   . Presence of automatic cardioverter/defibrillator (AICD)   . Renal disorder    Chronic kidney disease  . Restless leg syndrome   . Sick-euthyroid syndrome     History reviewed. No pertinent surgical history.  Prior to Admission medications   Medication Sig Start Date End Date Taking?  Authorizing Provider  acetaminophen (TYLENOL) 325 MG tablet Take 650 mg by mouth every 6 (six) hours as needed.   Yes [provider]  amiodarone (PACERONE) 200 MG tablet Take 200 mg by mouth daily.   Yes [provider]  apixaban (ELIQUIS) 5 MG TABS tablet Take 5 mg by mouth 2 (two) times daily.   Yes [provider]  aspirin EC 81 MG tablet Take 81 mg by mouth daily.   Yes [provider]  atorvastatin (LIPITOR) 80 MG tablet Take 80 mg by mouth every evening.   Yes [provider]  bumetanide (BUMEX) 2 MG tablet Take 2 mg by mouth 2 (two) times daily. *May take one additional tablet every 12 hours as needed for increase in edema/shortness of breath   Yes [provider]  carvedilol (COREG) 3.125 MG tablet Take 3.125 mg by mouth 2 (two) times daily with a meal.   Yes [provider]  docusate sodium (COLACE) 100 MG capsule Take 100 mg by mouth daily.   Yes [provider]  DULoxetine (CYMBALTA) 30 MG capsule Take 30 mg by mouth every evening.   Yes [provider]  gabapentin (NEURONTIN) 100 MG capsule Take 100 mg by mouth 3 (three) times daily.   Yes [provider]  guaifenesin (ROBITUSSIN) 100 MG/5ML syrup Take 200 mg by mouth 3 (three) times daily as needed for cough or congestion.   Yes [provider]  isosorbide mononitrate (IMDUR) 60 MG 24 hr tablet Take 60 mg by mouth daily.   Yes [provider]  magnesium oxide (MAG-OX) 400 MG tablet Take 400 mg by mouth  daily.   Yes [provider]  nitroGLYCERIN (NITROSTAT) 0.4 MG SL tablet Place 0.4 mg under the tongue every 5 (five) minutes as needed for chest pain.   Yes [provider]  oxycodone (OXY-IR) 5 MG capsule Take 5 mg by mouth every 4 (four) hours as needed for pain.   Yes [provider]  rOPINIRole (REQUIP) 1 MG tablet Take 1 mg by mouth at bedtime.   Yes [provider]  senna (SENOKOT) 8.6 MG  TABS tablet Take 1 tablet by mouth at bedtime.   Yes [provider]  spironolactone (ALDACTONE) 12.5 mg TABS tablet Take 12.5 mg by mouth daily.   Yes [provider]  sulfamethoxazole-trimethoprim (BACTRIM DS,SEPTRA DS) 800-160 MG tablet Take 1 tablet by mouth 2 (two) times daily. Starting on 12/08/2016-to be completed on 12/17/2016   Yes [provider]  tiotropium (SPIRIVA) 18 MCG inhalation capsule Place 18 mcg into inhaler and inhale daily.   Yes [provider]    Current Facility-Administered Medications  Medication Dose Route Frequency Provider Last Rate Last Dose  . acetaminophen (TYLENOL) tablet 650 mg  650 mg Oral Q6H PRN Opyd, Ilene Qua, MD      . albuterol (PROVENTIL) (2.5 MG/3ML) 0.083% nebulizer solution 2.5 mg  2.5 mg Nebulization Q2H PRN Reubin Milan, MD      . amiodarone (PACERONE) tablet 200 mg  200 mg Oral Daily Blanchie Dessert, MD   200 mg at 12/14/16 1033  . atorvastatin (LIPITOR) tablet 80 mg  80 mg Oral QPM Blanchie Dessert, MD   80 mg at 12/13/16 1704  . bumetanide (BUMEX) tablet 2 mg  2 mg Oral BID Blanchie Dessert, MD   2 mg at 12/14/16 1033  . carvedilol (COREG) tablet 3.125 mg  3.125 mg Oral BID WC Blanchie Dessert, MD   3.125 mg at 12/14/16 1034  . cefTRIAXone (ROCEPHIN) 1 g in dextrose 5 % 50 mL IVPB  1 g Intravenous Q24H [place], Gagan S, MD      . chlorhexidine (PERIDEX) 0.12 % solution 15 mL  15 mL Mouth Rinse BID Dhungel, Nishant, MD   15 mL at 12/14/16 2308  . dextrose 5 % and 0.45% NaCl 1,000 mL infusion   Intravenous Continuous Darrick Meigs, Marge Duncans, MD      . docusate sodium (COLACE) capsule 100 mg  100 mg Oral Daily Blanchie Dessert, MD   100 mg at 12/14/16 1033  . gabapentin (NEURONTIN) capsule 100 mg  100 mg Oral BID Okonkwo, Justina A, NP   100 mg at 12/13/16 1508  . haloperidol lactate (HALDOL) injection 2 mg  2 mg Intravenous Q6H PRN Oswald Hillock, MD   2 mg at 12/14/16 1926  . haloperidol lactate (HALDOL) injection  5 mg  5 mg Intramuscular Q6H PRN Opyd, Ilene Qua, MD      . insulin aspart (novoLOG) injection 0-20 Units  0-20 Units Subcutaneous TID WC Reubin Milan, MD   3 Units at 12/12/16 1243  . ipratropium-albuterol (DUONEB) 0.5-2.5 (3) MG/3ML nebulizer solution 2.5 mg  2.5 mg Nebulization Q6H Reubin Milan, MD   2.5 mg at 12/15/16 0845  . ipratropium-albuterol (DUONEB) 0.5-2.5 (3) MG/3ML nebulizer solution 3 mL  3 mL Nebulization Once Nat Christen, MD      . isosorbide mononitrate (IMDUR) 24 hr tablet 60 mg  60 mg Oral Daily Blanchie Dessert, MD   60 mg at 12/14/16 1033  . lactulose (CHRONULAC) 10 GM/15ML solution 20 g  20  g Oral BID Reubin Milan, MD   Stopped at 12/14/16 2200  . LORazepam (ATIVAN) injection 1 mg  1 mg Intravenous Q4H PRN Lovey Newcomer T, NP   1 mg at 12/15/16 0403  . magnesium oxide (MAG-OX) tablet 400 mg  400 mg Oral Daily Blanchie Dessert, MD   400 mg at 12/14/16 1000  . MEDLINE mouth rinse  15 mL Mouth Rinse q12n4p Dhungel, Nishant, MD   15 mL at 12/14/16 1237  . nitroGLYCERIN (NITROSTAT) SL tablet 0.4 mg  0.4 mg Sublingual Q5 min PRN Reubin Milan, MD      . ondansetron [facility]) tablet 4 mg  4 mg Oral Q6H PRN Reubin Milan, MD       Or  . ondansetron [facility]) injection 4 mg  4 mg Intravenous Q6H PRN Reubin Milan, MD      . oxyCODONE (Oxy IR/ROXICODONE) immediate release tablet 5 mg  5 mg Oral Q4H PRN Noemi Chapel, MD   5 mg at 12/13/16 1924  . phytonadione (VITAMIN K) 5 mg in dextrose 5 % 50 mL IVPB  5 mg Intravenous Once [place], Gagan S, MD      . risperiDONE (RISPERDAL) tablet 0.5 mg  0.5 mg Oral QHS Okonkwo, Justina A, NP   0.5 mg at 12/13/16 2054  . spironolactone (ALDACTONE) tablet 12.5 mg  12.5 mg Oral Daily Blanchie Dessert, MD   12.5 mg at 12/14/16 1034  . tiotropium (SPIRIVA) inhalation capsule 18 mcg  18 mcg Inhalation Daily Blanchie Dessert, MD   18 mcg at 12/11/16 1007    Allergies as of 12/10/2016 - Review Complete 12/10/2016    Allergen Reaction Noted  . Ampicillin Rash 12/10/2016  . Clindamycin/lincomycin Rash 12/10/2016  . Penicillins Rash 12/10/2016    History reviewed. No pertinent family history.  Social History   Social History  . Marital status: Single    Spouse name: N/A  . Number of children: N/A  . Years of education: N/A   Occupational History  . Not on file.   Social History Main Topics  . Smoking status: Never Smoker  . Smokeless tobacco: Never Used  . Alcohol use Not on file  . Drug use: Unknown  . Sexual activity: Not on file   Other Topics Concern  . Not on file   Social History Narrative  . No narrative on file    Review of Systems:  Unobtainable as patient is  Physical Exam: Vital signs in last 24 hours: Temp:  [98.1 F (36.7 C)-99.9 F (37.7 C)] 98.7 F (37.1 C) (10/06 0751) Pulse Rate:  [88-118] 88 (10/06 0850) Resp:  [14-28] 20 (10/06 0850) BP: (83-133)/(54-100) 96/57 (10/06 0850) SpO2:  [73 %-100 %] 95 % (10/06 0849) FiO2 (%):  [40 %-65 %] 40 % (10/06 0849) Weight:  [322 lb 12.1 oz (146.4 kg)] 322 lb 12.1 oz (146.4 kg) (10/06 0615) Last BM Date: 12/14/16 General:   Significantly obese. BiPAP in place. Patient unresponsive to physical stimuli  Neck:  Supple; no masses or thyromegaly. Lungs:  Poor inspiratory effort.  Heart:  Regular rate and rhythm; no murmurs, clicks, rubs,  or gallops. Abdomen:  Massively obese. Bowel sounds present. No obvious mass or organomegaly. Rectal: Good sphincter tone. Rectal vault without mass or stool. Scant amount of blood clot present.   Intake/Output from previous day: 10/05 0701 - 10/06 0700 In: 120 [P.O.:120] Out: 9767 [Urine:1375] Intake/Output this shift: No intake/output data recorded.  Lab Results:  Recent Labs  12/14/16  0355 12/14/16 1944 12/15/16 0449  WBC 6.5 8.3 6.5  HGB 8.7* 10.1* 8.8*  HCT 29.7* 35.2* 30.4*  PLT 186 242 194   BMET  Recent Labs  12/13/16 0358 12/14/16 0355 12/15/16 0449  NA  142 143 149*  K 3.7 3.6 4.2  CL 88* 88* 91*  CO2 45* 45* 44*  GLUCOSE 103* 80 92  BUN 39* 43* 51*  CREATININE 1.98* 1.95* 2.20*  CALCIUM 8.5* 8.4* 8.7*   LFT  Recent Labs  12/15/16 0449  PROT 6.4*  ALBUMIN 3.1*  AST 157*  ALT 91*  ALKPHOS 176*  BILITOT 1.7*   PT/INR  Recent Labs  12/14/16 1944  LABPROT 26.5*  INR 2.46   Impression:  Super morbidly obese gentleman with a multitude of comorbidities admitted to the hospital under involuntary commitment , psychosis /progressive respiratory failure. Hematochezia with clot noted overnight. Notable diminution in hemoglobin. Remains hemodynamically stable. Respiratory failure appears progressive. Intubation may be imminent.  Markedly depressed mental status. Little known about past GI history. Etiology of the bleeding not clear. Lower GI source such as diverticula or even hemorrhoidal in the setting of anticoagulation remain possibilities.  Less likely upper GI tract origin. Ischemia would remain in the differential as well. Acidosis appears to be primarily of respiratory in origin.    Recommendations:   Agree with managing as top priority at this time. He is not currently an endoscopy candidate. Would keep packed RBCs on standby should they be needed.  Add IV PPI therapy empirically until GI workup can be completed.   Agree with vitamin K and holding anticoagulation therapy.  Again, he is not an endoscopy candidate. In fact, given the multitude of comorbidities, I feel it would be wise to consider transfer to a tertiary referral center. I have called and discussed my concerns with Dr. Darrick Meigs.  Of note,  I have discussed with anesthesia Tressie Stalker, CRNA) here at [facility].  They, also would decline to provide anesthesia here for this high risk patient.  I will follow him along with you while he is here.       Notice:  This dictation was prepared with Dragon dictation along with smaller phrase technology. Any  transcriptional errors that result from this process are unintentional and may not be corrected upon review.

## 2016-12-15 NOTE — Progress Notes (Signed)
Pharmacy Antibiotic Note  Darrell Black is a 61 y.o. male admitted on 12/10/2016 with cellulitis.  Pharmacy has been consulted for ROCEPHIN dosing.  Plan: Rocephin 1gm IV q24hrs Monitor labs, progress, c/s  Height: 5\' 8"  (172.7 cm) Weight: (!) 322 lb 12.1 oz (146.4 kg) IBW/kg (Calculated) : 68.4  Temp (24hrs), Avg:98.9 F (37.2 C), Min:98.1 F (36.7 C), Max:99.9 F (37.7 C)   Recent Labs Lab 12/11/16 1839 12/12/16 0809 12/13/16 0358 12/14/16 0355 12/14/16 1944 12/15/16 0449  WBC 6.0 8.1  --  6.5 8.3 6.5  CREATININE 2.05* 1.90* 1.98* 1.95*  --  2.20*    Estimated Creatinine Clearance: 49.7 mL/min (A) (by C-G formula based on SCr of 2.2 mg/dL (H)).    Allergies  Allergen Reactions  . Ampicillin Rash  . Clindamycin/Lincomycin Rash  . Penicillins Rash    Has patient had a PCN reaction causing immediate rash, facial/tongue/throat swelling, SOB or lightheadedness with hypotension: Yes Has patient had a PCN reaction causing severe rash involving mucus membranes or skin necrosis: No Has patient had a PCN reaction that required hospitalization: No Has patient had a PCN reaction occurring within the last 10 years: No If all of the above answers are "NO", then may proceed with Cephalosporin use.    Antimicrobials this admission: Rocephin 10/6 >>   Dose adjustments this admission:  Microbiology results:  BCx: pending  UCx: pending   Sputum:    MRSA PCR:   Thank you for allowing pharmacy to be a part of this patient's care.  Hart Robinsons A 12/15/2016 9:44 AM

## 2016-12-15 NOTE — Progress Notes (Signed)
[facility]  PROGRESS NOTE  Darrell Black PPJ:[id number] DOB: [birth year] DOA: 12/10/2016 PCP: Renata Caprice, DO   Brief HPI:   61 year old morbidly obese male with OSA/OHS, chronic kidney disease stage III (baseline unknown), chronic combined systolic and diastolic CHF, A. fib on anticoagulation, coronary artery disease, ischemic cardiomyopathy with history of AICD and CABG, multiple cardiac stent (reportedly last cath in 02/2016) type 2 diabetes mellitus, hypertension, hyperlipidemia, denies muscle weakness, depression, peripheral neuropathy, sick euthyroid syndrome and restless leg syndrome was brought to the ED for progressive visual hallucination for past 4 days. Patient reports moving from [place] about 4 months back to the area. He reported being started on antidepressant in June following which she started having mild visual hallucinations, spiders crawling on the walls and curtains. Symptoms started to get worse and his antidepressant was dropped 5 days back but his symptoms were systemically worse. He was brought to the ED and was awaiting to get transfer to psych facility but started having shortness of breath. He also started bleeding from the his gums. He was given tranexemic acid and multiple suctioning letting which the bleeding has improved. In the ED he was found to be short of breath with cough and wheezing. ABG done showed pH of 7.32, PCO2 of 84, PO2 of 60 and bicarbonate of 37.8. CBC showed normal WBC, hemoglobin of 9.7 and platelets of 225. INR was 1.91, chemistry showed CO2 of 40, BUN of 36 and creatinine of 2.05. Ammonia of 48    Subjective   Patient continues to somnolent, ABG still shows PCO2 of 88.2. He was given Haldol this morning for agitation. Also patient is not having rectal bleeding. INR elevated 2.46   Assessment/Plan:     1. Acute respiratory failure with hypoxia and hypercapnia- multifactorial with CHF exacerbation, obstructive sleep apnea, and also  medications. Patient was started on Cogentin, dose of gabapentin increased and also Vistaril started for anxiety. Will hold his medications as patient has been heavily sedated with worsening of PCO2. Continue BiPAP. 2. Rectal bleeding- patient has rectal bleeding, INR elevated 2.45. Will give 1 dose of vitamin K 10 mg IV. GI was consulted, discussed with Dr. Gala Romney, who feel that patient is to transfer to [facility] as he will not be stable for colonoscopy at this time. Continue to hold eliquis. 3. Left leg cellulitis- will start the patient on ceftriaxone. 4. Acute encephalopathy- patient heavily sedated from psychotropic medications which are currently on hold. Ammonia was elevated to 48 on admission. Patient started on lactulose. Will repeat ammonia level today. 5. Acute on chronic combined systolic and diastolic [id number] echo showed EF 35-40%, diffuse hypokinesis , PA pressure of 38. Currently patient on beta blocker, Lipitor. will hold Bumex and Aldactone due to developing hypotension. 6. Acute psychosis- patient was seen by psychiatry in the ED and started on Cogentin, Vistaril, dose of gabapentin increased. Patient has been excessively somnolent due to these medications which will be held for now due to worsening PCO2. Will monitor patient off these medications 7. Paroxysmal atrial fibrillation-heart rate is controlled, continue beta blocker and amiodarone. Eliquis is currently on hold as patient came with gum bleeding, 8. Mouth bleeding-resolved after patient was given tranexemic acid. Patient has continued bleeding and hematuria. Anticoagulations and antiplatelet therapy on hold. 9. Coronary artery disease-status post CABG, stents. Continue beta blocker, Lipitor, Imdur. 10. Diabetes mellitus-continue sliding scale insulin with NovoLog. Hemoglobin A1c is 5.4 11. Restless leg syndrome-will hold Requip at this time as patient is heavily sedated  12. Morbid obesity with OSA/OHS-patient will  need sleep study as outpatient.  I called and discussed with intensivist Dr. Pearline Cables at [facility], who was accepted the patient in transfer. Will initiate transfer process.    DVT prophylaxis: SCDs  Code Status: SCDs  Family Communication: No family at bedside   Disposition Plan: Pending improvement in patients condition   Consultants:  None  Procedures:  None  Continuous infusions . cefTRIAXone (ROCEPHIN)  IV    . dextrose 5 % and 0.45% NaCl 1,000 mL infusion    . phytonadione (VITAMIN K) IV        Antibiotics:   Anti-infectives    Start     Dose/Rate Route Frequency Ordered Stop   12/15/16 1000  cefTRIAXone (ROCEPHIN) 1 g in dextrose 5 % 50 mL IVPB     1 g 100 mL/hr over 30 Minutes Intravenous Every 24 hours 12/15/16 0756     12/11/16 0000  sulfamethoxazole-trimethoprim (BACTRIM DS,SEPTRA DS) 800-160 MG per tablet 1 tablet  Status:  Discontinued     1 tablet Oral 2 times daily 12/10/16 2350 12/12/16 0700       Objective   Vitals:   12/15/16 0849 12/15/16 0850 12/15/16 1155 12/15/16 1224  BP:  (!) 96/57    Pulse:  88  87  Resp:  20  (!) 24  Temp:   98 F (36.7 C)   TempSrc:   Axillary   SpO2: 95%   91%  Weight:      Height:        Intake/Output Summary (Last 24 hours) at 12/15/16 1257 Last data filed at 12/15/16 0514  Gross per 24 hour  Intake              120 ml  Output              975 ml  Net             -855 ml   Filed Weights   12/13/16 0500 12/14/16 0400 12/15/16 0615  Weight: (!) 147.1 kg (324 lb 4.8 oz) (!) 146 kg (321 lb 14 oz) (!) 146.4 kg (322 lb 12.1 oz)     Physical Examination:   Physical Exam: Physical Exam:   Neck: Supple, no deformities, masses, or tenderness Lungs: Normal respiratory effort, bilateral clear to auscultation, no crackles or wheezes.  Heart: Regular rate and rhythm, S1 and S2 normal, no murmurs, rubs auscultated Abdomen: BS normoactive,soft,nondistended,non-tender to palpation,no  organomegaly Extremities: No pretibial edema, no erythema, no cyanosis, no clubbing Neuro : Somnolent, arouses to painful stimulation Skin: Bilateral erythema, warmth noted in lower extremities. Left more than right.    Data Reviewed: I have personally reviewed following labs and imaging studies  CBG:  Recent Labs Lab 12/14/16 1212 12/14/16 1656 12/14/16 2202 12/15/16 0749 12/15/16 1140  GLUCAP 111* 123* 122* 90 85    CBC:  Recent Labs Lab 12/10/16 2143 12/11/16 1839 12/12/16 0809  12/13/16 0358 12/13/16 1544 12/14/16 0355 12/14/16 1944 12/15/16 0449  WBC 6.4 6.0 8.1  --   --   --  6.5 8.3 6.5  NEUTROABS 4.9 4.7 6.9  --   --   --   --   --   --   HGB 9.7* 9.7* 10.0*  < > 9.3* 9.5* 8.7* 10.1* 8.8*  HCT 31.5* 32.4* 33.2*  < > 31.6* 32.6* 29.7* 35.2* 30.4*  MCV 95.5 97.9 98.5  --   --   --  103.1* 103.2* 103.4*  PLT 234 225 221  --   --   --  186 242 194  < > = values in this interval not displayed.  Basic Metabolic Panel:  Recent Labs Lab 12/11/16 1839 12/12/16 0809 12/13/16 0358 12/14/16 0355 12/15/16 0449  NA 139 139 142 143 149*  K 3.7 3.7 3.7 3.6 4.2  CL 89* 90* 88* 88* 91*  CO2 40* 42* 45* 45* 44*  GLUCOSE 115* 105* 103* 80 92  BUN 36* 35* 39* 43* 51*  CREATININE 2.05* 1.90* 1.98* 1.95* 2.20*  CALCIUM 8.5* 8.5* 8.5* 8.4* 8.7*  MG  --  1.5* 1.9  --   --   PHOS  --  4.4  --   --   --     Recent Results (from the past 240 hour(s))  MRSA PCR Screening     Status: None   Collection Time: 12/12/16  6:47 AM  Result Value Ref Range Status   MRSA by PCR NEGATIVE NEGATIVE Final    Comment:        The GeneXpert MRSA Assay (FDA approved for NASAL specimens only), is one component of a comprehensive MRSA colonization surveillance program. It is not intended to diagnose MRSA infection nor to guide or monitor treatment for MRSA infections.      Liver Function Tests:  Recent Labs Lab 12/12/16 0809 12/15/16 0449  AST 29 157*  ALT 23 91*   ALKPHOS 149* 176*  BILITOT 1.4* 1.7*  PROT 7.2 6.4*  ALBUMIN 3.7 3.1*   No results for input(s): LIPASE, AMYLASE in the last 168 hours.  Recent Labs Lab 12/11/16 1839  AMMONIA 48*    Cardiac Enzymes: No results for input(s): CKTOTAL, CKMB, CKMBINDEX, TROPONINI in the last 168 hours. BNP (last 3 results)  Recent Labs  12/11/16 2330  BNP 583.0*    ProBNP (last 3 results) No results for input(s): PROBNP in the last 8760 hours.    Studies: Dg Chest Port 1v Same Day  Result Date: 12/14/2016 CLINICAL DATA:  Dyspnea. EXAM: PORTABLE CHEST 1 VIEW COMPARISON:  12/11/2016. FINDINGS: Low lung volumes. Semierect portable technique. Marked enlargement of cardiac silhouette. Dual lead pacing device. BILATERAL pulmonary opacities are noted, worse from priors, representing either pulmonary edema or BILATERAL pneumonia. IMPRESSION: Worsening aeration.  See discussion above. Electronically Signed   By: Staci Righter M.D.   On: 12/14/2016 12:09    Scheduled Meds: . amiodarone  200 mg Oral Daily  . atorvastatin  80 mg Oral QPM  . carvedilol  3.125 mg Oral BID WC  . chlorhexidine  15 mL Mouth Rinse BID  . docusate sodium  100 mg Oral Daily  . gabapentin  100 mg Oral BID  . insulin aspart  0-20 Units Subcutaneous TID WC  . ipratropium-albuterol  2.5 mg Nebulization Q6H  . ipratropium-albuterol  3 mL Nebulization Once  . isosorbide mononitrate  60 mg Oral Daily  . lactulose  20 g Oral BID  . magnesium oxide  400 mg Oral Daily  . mouth rinse  15 mL Mouth Rinse q12n4p  . pantoprazole (PROTONIX) IV  40 mg Intravenous [id number]  . risperiDONE  0.5 mg Oral QHS  . tiotropium  18 mcg Inhalation Daily      Time spent:60 min  [facility]   [facility] Pager [phone]. If 7PM-7AM, please contact night-coverage at [email or web address], Office  [phone]  password [place]  12/15/2016, 12:57 PM  LOS: 3 days

## 2016-12-16 ENCOUNTER — Inpatient Hospital Stay (HOSPITAL_COMMUNITY): Payer: Medicaid Other

## 2016-12-16 LAB — POCT I-STAT 3, ART BLOOD GAS (G3+)
ACID-BASE EXCESS: 26 mmol/L — AB (ref 0.0–2.0)
Bicarbonate: 54.5 mmol/L — ABNORMAL HIGH (ref 20.0–28.0)
O2 Saturation: 96 %
PO2 ART: 86 mmHg (ref 83.0–108.0)
TCO2: >50 mmol/L — ABNORMAL HIGH (ref 22–32)
pCO2 arterial: 79.5 mmHg (ref 32.0–48.0)
pH, Arterial: 7.443 (ref 7.350–7.450)

## 2016-12-16 LAB — COOXEMETRY PANEL
Carboxyhemoglobin: 1.5 % (ref 0.5–1.5)
Methemoglobin: 1.2 % (ref 0.0–1.5)
O2 SAT: 68.7 %
TOTAL HEMOGLOBIN: 9.3 g/dL — AB (ref 12.0–16.0)

## 2016-12-16 LAB — CBC WITH DIFFERENTIAL/PLATELET
BASOS ABS: 0 10*3/uL (ref 0.0–0.1)
BASOS PCT: 0 %
BASOS PCT: 0 %
Basophils Absolute: 0 10*3/uL (ref 0.0–0.1)
EOS ABS: 0.2 10*3/uL (ref 0.0–0.7)
EOS PCT: 2 %
EOS PCT: 4 %
Eosinophils Absolute: 0.1 10*3/uL (ref 0.0–0.7)
HCT: 31.9 % — ABNORMAL LOW (ref 39.0–52.0)
HEMATOCRIT: 31 % — AB (ref 39.0–52.0)
HEMOGLOBIN: 8.9 g/dL — AB (ref 13.0–17.0)
Hemoglobin: 8.9 g/dL — ABNORMAL LOW (ref 13.0–17.0)
LYMPHS PCT: 9 %
Lymphocytes Relative: 10 %
Lymphs Abs: 0.6 10*3/uL — ABNORMAL LOW (ref 0.7–4.0)
Lymphs Abs: 0.5 10*3/uL — ABNORMAL LOW (ref 0.7–4.0)
MCH: 28.8 pg (ref 26.0–34.0)
MCH: 28.1 pg (ref 26.0–34.0)
MCHC: 28.7 g/dL — AB (ref 30.0–36.0)
MCHC: 27.9 g/dL — ABNORMAL LOW (ref 30.0–36.0)
MCV: 100.3 fL — AB (ref 78.0–100.0)
MCV: 100.6 fL — ABNORMAL HIGH (ref 78.0–100.0)
MONO ABS: 0.5 10*3/uL (ref 0.1–1.0)
MONOS PCT: 8 %
Monocytes Absolute: 0.5 10*3/uL (ref 0.1–1.0)
Monocytes Relative: 10 %
NEUTROS ABS: 4.9 10*3/uL (ref 1.7–7.7)
NEUTROS PCT: 76 %
Neutro Abs: 4.3 10*3/uL (ref 1.7–7.7)
Neutrophils Relative %: 81 %
PLATELETS: 170 10*3/uL (ref 150–400)
PLATELETS: 184 10*3/uL (ref 150–400)
RBC: 3.09 MIL/uL — ABNORMAL LOW (ref 4.22–5.81)
RBC: 3.17 MIL/uL — AB (ref 4.22–5.81)
RDW: 15.2 % (ref 11.5–15.5)
RDW: 14.9 % (ref 11.5–15.5)
WBC: 6.1 10*3/uL (ref 4.0–10.5)
WBC: 5.6 10*3/uL (ref 4.0–10.5)

## 2016-12-16 LAB — GLUCOSE, CAPILLARY
GLUCOSE-CAPILLARY: 88 mg/dL (ref 65–99)
GLUCOSE-CAPILLARY: 102 mg/dL — AB (ref 65–99)
Glucose-Capillary: 84 mg/dL (ref 65–99)
Glucose-Capillary: 100 mg/dL — ABNORMAL HIGH (ref 65–99)

## 2016-12-16 LAB — MAGNESIUM: Magnesium: 1.7 mg/dL (ref 1.7–2.4)

## 2016-12-16 LAB — BASIC METABOLIC PANEL
Anion gap: 9 (ref 5–15)
BUN: 38 mg/dL — AB (ref 6–20)
CO2: 47 mmol/L — ABNORMAL HIGH (ref 22–32)
CREATININE: 1.61 mg/dL — AB (ref 0.61–1.24)
Calcium: 8.6 mg/dL — ABNORMAL LOW (ref 8.9–10.3)
Chloride: 92 mmol/L — ABNORMAL LOW (ref 101–111)
GFR calc Af Amer: 52 mL/min — ABNORMAL LOW (ref >60)
GFR, EST NON AFRICAN AMERICAN: 45 mL/min — AB (ref >60)
GLUCOSE: 86 mg/dL (ref 65–99)
POTASSIUM: 3.6 mmol/L (ref 3.5–5.1)
Sodium: 148 mmol/L — ABNORMAL HIGH (ref 135–145)

## 2016-12-16 LAB — LACTIC ACID, PLASMA: Lactic Acid, Venous: 1 mmol/L (ref 0.5–1.9)

## 2016-12-16 LAB — ABO/RH: ABO/RH(D): B POS

## 2016-12-16 LAB — PREPARE RBC (CROSSMATCH)

## 2016-12-16 LAB — PHOSPHORUS: Phosphorus: 2.4 mg/dL — ABNORMAL LOW (ref 2.5–4.6)

## 2016-12-16 MED ORDER — INSULIN ASPART 100 UNIT/ML ~~LOC~~ SOLN
2.0000 [IU] | SUBCUTANEOUS | Status: DC
  Administered 2016-12-18 (×2): 4 [IU] via SUBCUTANEOUS
  Administered 2016-12-18: 2 [IU] via SUBCUTANEOUS

## 2016-12-16 MED ORDER — SODIUM CHLORIDE 0.9% FLUSH
10.0000 mL | INTRAVENOUS | Status: DC | PRN
  Administered 2016-12-20 – 2016-12-25 (×8): 10 mL
  Administered 2016-12-27: 30 mL
  Administered 2016-12-28 – 2016-12-30 (×5): 10 mL
  Filled 2016-12-16 (×14): qty 40

## 2016-12-16 MED ORDER — METOPROLOL TARTRATE 5 MG/5ML IV SOLN: 5.0000 mg | Freq: Four times a day (QID) | INTRAVENOUS | Status: DC | PRN

## 2016-12-16 MED ORDER — CHLORHEXIDINE GLUCONATE CLOTH 2 % EX PADS
6.0000 | MEDICATED_PAD | Freq: Every day | CUTANEOUS | Status: DC
  Administered 2016-12-16 – 2016-12-19 (×4): 6 via TOPICAL

## 2016-12-16 MED ORDER — IPRATROPIUM-ALBUTEROL 0.5-2.5 (3) MG/3ML IN SOLN
3.0000 mL | Freq: Four times a day (QID) | RESPIRATORY_TRACT | Status: DC
  Administered 2016-12-16: 3 mL via RESPIRATORY_TRACT
  Filled 2016-12-16 (×2): qty 3

## 2016-12-16 MED ORDER — CEFAZOLIN SODIUM-DEXTROSE 1-4 GM/50ML-% IV SOLN
1.0000 g | Freq: Three times a day (TID) | INTRAVENOUS | Status: DC
  Administered 2016-12-16 – 2016-12-20 (×12): 1 g via INTRAVENOUS
  Filled 2016-12-16 (×15): qty 50

## 2016-12-16 MED ORDER — SODIUM CHLORIDE 0.9 % IV SOLN
Freq: Once | INTRAVENOUS | Status: AC
  Administered 2016-12-16: 21:00:00 via INTRAVENOUS

## 2016-12-16 MED ORDER — SODIUM CHLORIDE 0.9% FLUSH
10.0000 mL | Freq: Two times a day (BID) | INTRAVENOUS | Status: DC
Start: 2016-12-16 — End: 2016-12-31
  Administered 2016-12-16: 30 mL
  Administered 2016-12-16: 10 mL
  Administered 2016-12-17: 30 mL
  Administered 2016-12-17 – 2016-12-18 (×3): 10 mL
  Administered 2016-12-20: 20 mL
  Administered 2016-12-21 – 2016-12-30 (×7): 10 mL

## 2016-12-16 NOTE — Progress Notes (Signed)
PULMONARY / CRITICAL CARE MEDICINE   Name: Darrell Black MRN: [id number] DOB: 30-Jun-1955    ADMISSION DATE:  12/10/2016 CONSULTATION DATE:  12/15/2016  REFERRING MD:  [facility]  CHIEF COMPLAINT:  GI bleeding  Brief:   61 y.o. male with a history of ischemic cardiomyopathy, CAD status post previous PCI and CABG, persistent atrial fibrillation, obstructive sleep apnea, seen Jude ICD in place, and acute on chronic renal insufficiency who was admitted for hallucination and was transferred from [facility] due to lower GI bleeding on elequis given vitamin k and TXA there. He has been requiring BIPAP and has been encephalopathic.   VITAL SIGNS: BP (!) 97/55   Pulse (!) 103   Temp 98 F (36.7 C) (Oral)   Resp (!) 22   Ht 5\' 8"  (1.727 m)   Wt (!) 181 kg (399 lb)   SpO2 94%   BMI 60.67 kg/m   HEMODYNAMICS:    VENTILATOR SETTINGS: Vent Mode: BIPAP FiO2 (%):  [45 %-50 %] 45 % Set Rate:  [10 bmp] 10 bmp PEEP:  [10 cmH20] 10 cmH20  INTAKE / OUTPUT: I/O last 3 completed shifts: In: 840 [I.V.:790; IV Piggyback:50] Out: 0932 [Urine:4350]  PHYSICAL EXAMINATION: General: Adult male, no distress  Neuro: Alert, does not follow commands, disoriented, non-cooperative  HEENT: Bipap in place  Cardiovascular: A.Fib, Rate Controlled  Lungs: non-labored, diminished breath sounds, no wheeze  Abdomen: obese, active bowel sounds  Musculoskeletal: swelling to BLE  Skin: warm, dry, intact   LABS:  BMET  Recent Labs Lab 12/15/16 0449 12/15/16 2039 12/16/16 0248  NA 149* 146* 148*  K 4.2 3.7 3.6  CL 91* 91* 92*  CO2 44* 47* 47*  BUN 51* 43* 38*  CREATININE 2.20* 1.79* 1.61*  GLUCOSE 92 101* 86    Electrolytes  Recent Labs Lab 12/12/16 0809 12/13/16 0358  12/15/16 0449 12/15/16 2039 12/16/16 0248  CALCIUM 8.5* 8.5*  < > 8.7* 8.4* 8.6*  MG 1.5* 1.9  --   --   --  1.7  PHOS 4.4  --   --   --   --  2.4*  < > = values in this interval not displayed.  CBC  Recent Labs Lab 12/15/16 0449  12/15/16 2039 12/16/16 0248  WBC 6.5 5.5 6.1  HGB 8.8* 8.7* 8.9*  HCT 30.4* 30.9* 31.0*  PLT 194 180 170    Coag's  Recent Labs Lab 12/11/16 1839 12/14/16 1944 12/15/16 2039  APTT  --   --  40*  INR 1.91 2.46 1.90    Sepsis Markers No results for input(s): LATICACIDVEN, PROCALCITON, O2SATVEN in the last 168 hours.  ABG  Recent Labs Lab 12/14/16 0435 12/15/16 0510 12/15/16 2105  PHART 7.303* 7.349* 7.414  PCO2ART 96.7* 88.5* 83.6*  PO2ART 76.1* 67.0* 86.0    Liver Enzymes  Recent Labs Lab 12/12/16 0809 12/15/16 0449 12/15/16 2039  AST 29 157* 112*  ALT 23 91* 84*  ALKPHOS 149* 176* 166*  BILITOT 1.4* 1.7* 1.4*  ALBUMIN 3.7 3.1* 2.9*    Cardiac Enzymes No results for input(s): TROPONINI, PROBNP in the last 168 hours.  Glucose  Recent Labs Lab 12/15/16 1140 12/15/16 1618 12/15/16 1903 12/15/16 2341 12/16/16 0419 12/16/16 0722  GLUCAP 85 112* 112* 79 88 84    Imaging Dg Chest Port 1 View  Result Date: 12/15/2016 CLINICAL DATA:  Congestive heart failure EXAM: PORTABLE CHEST 1 VIEW COMPARISON:  12/14/2016 FINDINGS: Postoperative changes in the mediastinum. Cardiac pacemaker. Shallow inspiration with atelectasis in  the lung bases. Cardiac enlargement. Perihilar infiltration likely represents edema although multifocal pneumonia could also have this appearance. Can't exclude a small right pleural effusion. No apparent pneumothorax. Similar appearance to previous study, lying for differences in technique appear IMPRESSION: Cardiac enlargement with bilateral perihilar infiltrates likely edema. Shallow inspiration with atelectasis in the lung bases. Electronically Signed   By: Lucienne Capers M.D.   On: 12/15/2016 21:10    STUDIES:  CXR 10/2 > Cardiac enlargement with vascular congestion and small bilateral pleural effusions. Atelectasis in the lung bases CXR 10/2 > Stable appearance of the chest since previous study. Again demonstrated is cardiac  enlargement with pulmonary vascular congestion and bilateral pleural effusions. Shallow inspiration with atelectasis in the lung bases CXR 10/5 > Low lung volumes. Semierect portable technique. Marked enlargement of cardiac silhouette. Dual lead pacing device. BILATERAL pulmonary opacities are noted, worse from priors, representing either pulmonary edema or BILATERAL pneumonia.   CULTURES: MRSA PCR 10/3 > Negative   ANTIBIOTICS: Rocephin 10/6 > 10/7  Ancef 10/7 >>   SIGNIFICANT EVENTS: 10/1 > HI/SI > Behavioral Health Hold 10/3 > Dyspnea, Bleeding   LINES/TUBES: Right Brachial PICC 10/6 >>  Foley 10/3 >>   DISCUSSION: 61 year old male admitted 10/1 for HI/SI, 10/3 became progressively short of breath requiring BIPAP. Transferred to [facility] on 10/6.    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)  H/O A.Fib, CAD, HTN, HLD     P:  Cardiac Monitoring  Maintain MAP >65  Amiodarone,  Coreg  Hold home Lipitor, Amiodarone, Coreg, Imdur until able to take PO  Continue to hold anticoagulation  PRN Metoprolol for HR >120  May need to start IV Ammio   RENAL A:   CKD (unsure baseline Crt)  Hypernatremia  P:   Trend BMP  Replace electrolytes  D/C Lasix > negative 9.6L with hypernatremia     GASTROINTESTINAL A:   GI Bleed  - on eliquis for A.fib  Hyperammonemia > Improved  P:   GI Following  NPO  PPI  Zofran PRN  Trend Ammonia in AM  > D/C Lactulose   HEMATOLOGIC A:   Anemia from Acute blood loss  P:  Trend CBC  Maintain Hbg >7  INFECTIOUS A:   Cellulitis to BLE  P:   Trend WBC and Fever Start Ancef   ENDOCRINE A:   DM   P:   Trend Glucose  SSI   NEUROLOGIC A:   Severe metabolic encephalopathy  multifactorial in setting of polypharmacy, hypercapnia +/-infectious process  Acute Psychosis  H/O Depression P:   Monitor  Risperdal at HS  Hold gabapentin, oxycodone and ativan   PRN Haldol   FAMILY  - Updates: no family at bedside   - Inter-disciplinary family meet or Palliative Care meeting due by:  10/14   CC Time: 65 minutes  Hayden Pedro, AGACNP-BC [place]  Pgr: [phone]  PCCM Pgr: [phone]

## 2016-12-16 NOTE — Progress Notes (Signed)
Shaved pts beard and mustache off to achieve a better seal on the BiPaP mask. Pt uncooperative, does not follow direction well. Combative with staff.

## 2016-12-16 NOTE — Progress Notes (Signed)
RT note: ABG results reported to NP Elige Radon at 1023. 7.44/ 79.5 PCO2/ 88 PAO2/ 54.5 HCO.

## 2016-12-16 NOTE — Progress Notes (Signed)
[facility] PCCM  Mr. Brannan coox was down slightly, 68%, he is anemic, needs 1 U blood to aim for goal Hgb 10gm/dL No family available to provide consent As it is medically necessary to administer a unit of PRBC will proceed with transfusion as emergent/medically necessary consent  Roselie Awkward, MD [place] PCCM Pager: [phone] Cell: [phone] After 3pm or if no response, call [phone]

## 2016-12-16 NOTE — Progress Notes (Signed)
Pharmacy Antibiotic Note  Darrell Black is a 61 y.o. male admitted on 12/10/2016 with cellulitis in both legs.  Pharmacy has been consulted for cefazolin dosing WBC WNL and patient is afebrile. Patient does have a noted penicillin allergy in the past but tolerated ceftriaxone well. Scr is elevated at 1.61 (unknown baseline) with CrCl ~ 65-70 ml/min.   Plan: Cefazolin 1 gm every 8 hours Monitor renal function  Monitor tolerance and clinical s/sx of infection   Height: 5\' 8"  (172.7 cm) Weight: (!) 399 lb (181 kg) IBW/kg (Calculated) : 68.4  Temp (24hrs), Avg:98.1 F (36.7 C), Min:97.5 F (36.4 C), Max:98.8 F (37.1 C)   Recent Labs Lab 12/13/16 0358 12/14/16 0355 12/14/16 1944 12/15/16 0449 12/15/16 2039 12/16/16 0248  WBC  --  6.5 8.3 6.5 5.5 6.1  CREATININE 1.98* 1.95*  --  2.20* 1.79* 1.61*    Estimated Creatinine Clearance: 77.3 mL/min (A) (by C-G formula based on SCr of 1.61 mg/dL (H)).    Allergies  Allergen Reactions  . Ampicillin Rash  . Clindamycin/Lincomycin Rash  . Penicillins Rash    Has patient had a PCN reaction causing immediate rash, facial/tongue/throat swelling, SOB or lightheadedness with hypotension: Yes Has patient had a PCN reaction causing severe rash involving mucus membranes or skin necrosis: No Has patient had a PCN reaction that required hospitalization: No Has patient had a PCN reaction occurring within the last 10 years: No If all of the above answers are "NO", then may proceed with Cephalosporin use.     Antimicrobials this admission: 10/6 Ceftriaxone X 1 dose  10/7 Cefazolin>>   Thank you for allowing pharmacy to be a part of this patient's care.  Susa Raring, PharmD PGY2 Infectious Diseases Pharmacy Resident Pager: [phone]  12/16/2016 10:47 AM

## 2016-12-17 ENCOUNTER — Encounter (HOSPITAL_COMMUNITY): Payer: Self-pay | Admitting: Physician Assistant

## 2016-12-17 ENCOUNTER — Inpatient Hospital Stay (HOSPITAL_COMMUNITY): Payer: Medicaid Other

## 2016-12-17 DIAGNOSIS — R4182 Altered mental status, unspecified: Secondary | ICD-10-CM

## 2016-12-17 DIAGNOSIS — K922 Gastrointestinal hemorrhage, unspecified: Secondary | ICD-10-CM

## 2016-12-17 DIAGNOSIS — D649 Anemia, unspecified: Secondary | ICD-10-CM

## 2016-12-17 DIAGNOSIS — K625 Hemorrhage of anus and rectum: Secondary | ICD-10-CM

## 2016-12-17 LAB — CBC WITH DIFFERENTIAL/PLATELET
BASOS ABS: 0 10*3/uL (ref 0.0–0.1)
BASOS ABS: 0 10*3/uL (ref 0.0–0.1)
BASOS PCT: 0 %
BASOS PCT: 1 %
EOS PCT: 4 %
Eosinophils Absolute: 0.2 10*3/uL (ref 0.0–0.7)
Eosinophils Absolute: 0.3 10*3/uL (ref 0.0–0.7)
Eosinophils Relative: 3 %
HCT: 35.3 % — ABNORMAL LOW (ref 39.0–52.0)
HCT: 35.3 % — ABNORMAL LOW (ref 39.0–52.0)
Hemoglobin: 10 g/dL — ABNORMAL LOW (ref 13.0–17.0)
Hemoglobin: 10.1 g/dL — ABNORMAL LOW (ref 13.0–17.0)
Lymphocytes Relative: 9 %
Lymphocytes Relative: 8 %
Lymphs Abs: 0.6 10*3/uL — ABNORMAL LOW (ref 0.7–4.0)
Lymphs Abs: 0.6 10*3/uL — ABNORMAL LOW (ref 0.7–4.0)
MCH: 28.1 pg (ref 26.0–34.0)
MCH: 28.5 pg (ref 26.0–34.0)
MCHC: 28.3 g/dL — AB (ref 30.0–36.0)
MCHC: 28.6 g/dL — AB (ref 30.0–36.0)
MCV: 99.2 fL (ref 78.0–100.0)
MCV: 99.4 fL (ref 78.0–100.0)
MONO ABS: 0.7 10*3/uL (ref 0.1–1.0)
MONOS PCT: 11 %
Monocytes Absolute: 0.6 10*3/uL (ref 0.1–1.0)
Monocytes Relative: 10 %
NEUTROS ABS: 5.1 10*3/uL (ref 1.7–7.7)
NEUTROS PCT: 78 %
Neutro Abs: 5 10*3/uL (ref 1.7–7.7)
Neutrophils Relative %: 76 %
PLATELETS: 191 10*3/uL (ref 150–400)
PLATELETS: 188 10*3/uL (ref 150–400)
RBC: 3.56 MIL/uL — ABNORMAL LOW (ref 4.22–5.81)
RBC: 3.55 MIL/uL — ABNORMAL LOW (ref 4.22–5.81)
RDW: 16.4 % — ABNORMAL HIGH (ref 11.5–15.5)
RDW: 16.3 % — AB (ref 11.5–15.5)
WBC: 6.5 10*3/uL (ref 4.0–10.5)
WBC: 6.6 10*3/uL (ref 4.0–10.5)

## 2016-12-17 LAB — TYPE AND SCREEN
ABO/RH(D): B POS
Antibody Screen: NEGATIVE
UNIT DIVISION: 0

## 2016-12-17 LAB — BASIC METABOLIC PANEL
ANION GAP: 10 (ref 5–15)
BUN: 26 mg/dL — ABNORMAL HIGH (ref 6–20)
CALCIUM: 8.7 mg/dL — AB (ref 8.9–10.3)
CO2: 48 mmol/L — ABNORMAL HIGH (ref 22–32)
Chloride: 90 mmol/L — ABNORMAL LOW (ref 101–111)
Creatinine, Ser: 1.36 mg/dL — ABNORMAL HIGH (ref 0.61–1.24)
GFR, EST NON AFRICAN AMERICAN: 55 mL/min — AB (ref >60)
Glucose, Bld: 97 mg/dL (ref 65–99)
Potassium: 3.5 mmol/L (ref 3.5–5.1)
SODIUM: 148 mmol/L — AB (ref 135–145)

## 2016-12-17 LAB — GLUCOSE, CAPILLARY
GLUCOSE-CAPILLARY: 87 mg/dL (ref 65–99)
Glucose-Capillary: 94 mg/dL (ref 65–99)
Glucose-Capillary: 122 mg/dL — ABNORMAL HIGH (ref 65–99)

## 2016-12-17 LAB — AMMONIA: AMMONIA: 20 umol/L (ref 9–35)

## 2016-12-17 LAB — BPAM RBC
BLOOD PRODUCT EXPIRATION DATE: 201810302359
ISSUE DATE / TIME: 201810071719
Unit Type and Rh: 7300

## 2016-12-17 LAB — PHOSPHORUS: PHOSPHORUS: 2.7 mg/dL (ref 2.5–4.6)

## 2016-12-17 LAB — MAGNESIUM: MAGNESIUM: 1.6 mg/dL — AB (ref 1.7–2.4)

## 2016-12-17 MED ORDER — POTASSIUM CHLORIDE CRYS ER 10 MEQ PO TBCR
EXTENDED_RELEASE_TABLET | ORAL | Status: AC
  Administered 2016-12-17: 20 meq
  Filled 2016-12-17: qty 2

## 2016-12-17 MED ORDER — CARVEDILOL 3.125 MG PO TABS
3.1250 mg | ORAL_TABLET | Freq: Two times a day (BID) | ORAL | Status: DC
  Administered 2016-12-17 – 2016-12-31 (×26): 3.125 mg via ORAL
  Filled 2016-12-17 (×27): qty 1

## 2016-12-17 MED ORDER — ATORVASTATIN CALCIUM 80 MG PO TABS
80.0000 mg | ORAL_TABLET | Freq: Every day | ORAL | Status: DC
  Administered 2016-12-17 – 2016-12-30 (×12): 80 mg via ORAL
  Filled 2016-12-17 (×13): qty 1

## 2016-12-17 MED ORDER — AMIODARONE HCL 200 MG PO TABS
200.0000 mg | ORAL_TABLET | Freq: Every day | ORAL | Status: DC
  Administered 2016-12-17 – 2016-12-31 (×15): 200 mg via ORAL
  Filled 2016-12-17 (×15): qty 1

## 2016-12-17 MED ORDER — POTASSIUM CHLORIDE CRYS ER 20 MEQ PO TBCR
40.0000 meq | EXTENDED_RELEASE_TABLET | Freq: Two times a day (BID) | ORAL | Status: AC
  Administered 2016-12-17 (×2): 40 meq via ORAL
  Filled 2016-12-17 (×2): qty 2

## 2016-12-17 MED ORDER — MAGNESIUM SULFATE 2 GM/50ML IV SOLN
2.0000 g | Freq: Two times a day (BID) | INTRAVENOUS | Status: AC
  Administered 2016-12-17 (×2): 2 g via INTRAVENOUS
  Filled 2016-12-17 (×2): qty 50

## 2016-12-17 MED ORDER — FUROSEMIDE 10 MG/ML IJ SOLN
80.0000 mg | Freq: Two times a day (BID) | INTRAMUSCULAR | Status: DC
  Administered 2016-12-17 – 2016-12-22 (×10): 80 mg via INTRAVENOUS
  Filled 2016-12-17 (×10): qty 8

## 2016-12-17 NOTE — Consult Note (Addendum)
[facility] Nurse wound consult note Reason for Consult: Consult requested for toes and bilat legs.  Pt states he fell at home and developed several wounds to his toes and legs; these are NOT stasis ulcers or necrotic wounds related to decreased circulation..   Wound type: Left anterior toe .2X.2cm intact blood blister Right anterior toe with previous blood blister which has ruptured and evolved into full thickness tissue loss; .5X.5X.1cm, dark red wound bed, small amt bloody tinged drainage. Patchy areas of blood blisters and abrasions to bilat legs; full thickness wound to left inner calf .8X.8X.2cm, yellow moist wound bed, small amt yellow drainage, no odor. Left leg with partial thickness wound, .5X.5X.1cm, red moist wound bed Right leg with patchy areas of partial thickness wounds, .3X.3X.1cm and .3X.2X.1cm, red moist wound beds. Dressing procedure/placement/frequency: Foam dressing to protect and promote healing. Discussed plan of care with patient. Please re-consult if further assistance is needed.  Thank-you,  Julien Girt MSN, [place], [place], [place], [place]
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
## 2016-12-17 NOTE — Consult Note (Addendum)
Cardiology Consultation:   Patient ID: Darrell Black; [id number]; 08/11/1955   Admit date: 12/10/2016 Date of Consult: 12/17/2016  Primary Care Provider: Renata Caprice, DO Primary Cardiologist: Vernon Prey [facility]), Prudy Feeler at [facility]  Primary Electrophysiologist:     Patient Profile:   Darrell Black is a 61 y.o. male with a hx of CAD s/p PCI and CABG (2001), ischemic cardiomyopathy with OSR with LVEF of 85%, chronic systolic and diastolic heart failure, AICD in place ([employer] placed in 2008 and revised in 2014, per patient), persistent Afib on elqiuis, HTN, HLD, morbid obesity, depression, DM, OSA, CKD stage 3 who is being seen today for the evaluation of Afib and CHF at the request of Dr. Titus Mould.  History of Present Illness:   [name] reported to [facility] with 4 days of visual hallucinations that may have been related to starting a new antidepressant this past summer. Symptoms worsened after stopping the antidepressant in the hospital. He was concerned for others' safety around him because he could no longer distinguish between halluciniations and reality. He was awaiting transfer to a psychiatric hospital when he began having worsening dyspnea, coughing, wheezing and was sedated from psychiatric medications. He began having oral bleeding that was partially resolved with tranexamic acid. BiPAP ventilation wasn't used due to frequent suctioning to prevent aspiration. Anticoagulation was held for oral bleeding. Overnight, he began having bleeding per rectum and GI was consulted. He was found to have a lower GI bleed. He was transferred from [facility] for further management.   Cardiology was consulted for acute on chronic combined systolic and diastolic heart failure and Afib in the setting of encephalopathy vs acute psychosis and GI bleed. On my interview, pt has occasional palpitations with his Afib, but is largely asymptomatic. He denies chest pain and recent DOE.  He states his lower extremity  edema is at his baseline. Currently on 80 mg IV lasix BID and diuresing well.   Past Medical History:  Diagnosis Date  . Atherosclerosis   . Atrial fibrillation ([place])   . 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 Chest pain   . Chronic combined systolic and diastolic heart failure ([place])   . Chronic pain   . Coronary artery disease   . 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 Hyperlipidemia   . Hypertension    essential  . Ischemic cardiomyopathy   . Major depressive disorder, recurrent ([place])   . Morbid obesity due to excess calories ([place])   . Muscle weakness (generalized)   . Neuropathy   . Obstructive sleep apnea   . Presence of automatic cardioverter/defibrillator (AICD)   . Renal disorder    Chronic kidney disease  . Restless leg syndrome   . Sick-euthyroid syndrome     History reviewed. No pertinent surgical history.   Home Medications:  Prior to Admission medications   Medication Sig Start Date End Date Taking? Authorizing Provider  acetaminophen (TYLENOL) 325 MG tablet Take 650 mg by mouth every 6 (six) hours as needed.   Yes [provider]  amiodarone (PACERONE) 200 MG tablet Take 200 mg by mouth daily.   Yes [provider]  apixaban (ELIQUIS) 5 MG TABS tablet Take 5 mg by mouth 2 (two) times daily.   Yes [provider]  aspirin EC 81 MG tablet Take 81 mg by mouth daily.   Yes [provider]  atorvastatin (LIPITOR) 80 MG tablet Take 80 mg by mouth every  evening.   Yes [provider]  bumetanide (BUMEX) 2 MG tablet Take 2 mg by mouth 2 (two) times daily. *May take one additional tablet every 12 hours as needed for increase in edema/shortness of breath   Yes [provider]  carvedilol (COREG) 3.125 MG tablet Take 3.125 mg by mouth 2 (two) times daily with a meal.   Yes [provider]  docusate sodium (COLACE) 100 MG capsule Take 100 mg by mouth daily.   Yes  [provider]  DULoxetine (CYMBALTA) 30 MG capsule Take 30 mg by mouth every evening.   Yes [provider]  gabapentin (NEURONTIN) 100 MG capsule Take 100 mg by mouth 3 (three) times daily.   Yes [provider]  guaifenesin (ROBITUSSIN) 100 MG/5ML syrup Take 200 mg by mouth 3 (three) times daily as needed for cough or congestion.   Yes [provider]  isosorbide mononitrate (IMDUR) 60 MG 24 hr tablet Take 60 mg by mouth daily.   Yes [provider]  magnesium oxide (MAG-OX) 400 MG tablet Take 400 mg by mouth daily.   Yes [provider]  nitroGLYCERIN (NITROSTAT) 0.4 MG SL tablet Place 0.4 mg under the tongue every 5 (five) minutes as needed for chest pain.   Yes [provider]  oxycodone (OXY-IR) 5 MG capsule Take 5 mg by mouth every 4 (four) hours as needed for pain.   Yes [provider]  rOPINIRole (REQUIP) 1 MG tablet Take 1 mg by mouth at bedtime.   Yes [provider]  senna (SENOKOT) 8.6 MG TABS tablet Take 1 tablet by mouth at bedtime.   Yes [provider]  spironolactone (ALDACTONE) 12.5 mg TABS tablet Take 12.5 mg by mouth daily.   Yes [provider]  sulfamethoxazole-trimethoprim (BACTRIM DS,SEPTRA DS) 800-160 MG tablet Take 1 tablet by mouth 2 (two) times daily. Starting on 12/08/2016-to be completed on 12/17/2016   Yes [provider]  tiotropium (SPIRIVA) 18 MCG inhalation capsule Place 18 mcg into inhaler and inhale daily.   Yes [provider]    Inpatient Medications: Scheduled Meds: . amiodarone  200 mg Oral Daily  . atorvastatin  80 mg Oral q1800  . carvedilol  3.125 mg Oral BID WC  . chlorhexidine  15 mL Mouth Rinse BID  . Chlorhexidine Gluconate Cloth  6 each Topical Daily  . furosemide  80 mg Intravenous Q12H  . insulin aspart  2-6 Units Subcutaneous Q4H  . mouth rinse  15 mL Mouth Rinse q12n4p  . pantoprazole (PROTONIX) IV  40 mg Intravenous  BH-q7a  . potassium chloride  40 mEq Oral BID  . sodium chloride flush  10-40 mL Intracatheter Q12H   Continuous Infusions: .  ceFAZolin (ANCEF) IV Stopped (12/17/16 1241)  . magnesium sulfate 1 - 4 g bolus IVPB 2 g (12/17/16 1322)   PRN Meds: acetaminophen, albuterol, metoprolol tartrate, nitroGLYCERIN, ondansetron **OR** ondansetron (ZOFRAN) IV, sodium chloride flush  Allergies:    Allergies  Allergen Reactions  . Ampicillin Rash  . Clindamycin/Lincomycin Rash  . Penicillins Rash    Has patient had a PCN reaction causing immediate rash, facial/tongue/throat swelling, SOB or lightheadedness with hypotension: Yes Has patient had a PCN reaction causing severe rash involving mucus membranes or skin necrosis: No Has patient had a PCN reaction that required hospitalization: No Has patient had a PCN reaction occurring within the last 10 years: No If all of the above answers are "NO", then may proceed with Cephalosporin  use.     Social History:   Social History   Social History  . Marital status: Single    Spouse name: N/A  . Number of children: N/A  . Years of education: N/A   Occupational History  . Not on file.   Social History Main Topics  . Smoking status: Never Smoker  . Smokeless tobacco: Never Used  . Alcohol use Not on file  . Drug use: Unknown  . Sexual activity: Not on file   Other Topics Concern  . Not on file   Social History Narrative  . No narrative on file    Family History:   Family History  Problem Relation Age of Onset  . Hypertension Father      ROS:  Please see the history of present illness.  ROS  All other ROS reviewed and negative.     Physical Exam/Data:   Vitals:   12/17/16 1000 12/17/16 1100 12/17/16 1200 12/17/16 1300  BP:    116/80  Pulse: 89 82 87 81  Resp: 20 20 (!) 30 (!) 29  Temp:      TempSrc:      SpO2: 94% 97% (!) 89% 94%  Weight:    (!) 309 lb (140.2 kg)  Height:        Intake/Output Summary (Last 24 hours) at  12/17/16 1348 Last data filed at 12/17/16 1322  Gross per 24 hour  Intake              775 ml  Output             2100 ml  Net            -1325 ml   Filed Weights   12/15/16 1930 12/16/16 0530 12/17/16 1300  Weight: (!) 402 lb (182.3 kg) (!) 399 lb (181 kg) (!) 309 lb (140.2 kg)   Body mass index is 46.98 kg/m.  General:  Morbidly obese male in no acute distress HEENT: normal Neck:  No JVD but exam difficult Vascular: No carotid bruits Cardiac:  normal S1, S2; RRR; no murmur Lungs:  clear to auscultation in upper lobes, but very diminished in bases Abd: soft, nontender, no hepatomegaly  Ext: 1+ edema, erythematous and several skin tears to both legs Musculoskeletal:  No deformities, BUE and BLE strength normal and equal Skin: warm and dry  Neuro:  CNs 2-12 intact, no focal abnormalities noted Psych:  Normal affect   EKG:  The EKG was personally reviewed and demonstrates:  Afib, rate-controlled Telemetry:  Telemetry was personally reviewed and demonstrates:  Afib, rate-controlled  Relevant CV Studies:  Echocardiogram 12/12/16 Study Conclusions - Procedure narrative: Contrast enhancement employed. - Left ventricle: The cavity size was normal. Wall thickness was   increased in a pattern of mild LVH. Systolic function was   moderately reduced. The estimated ejection fraction was in the   range of 35% to 40%. Diffuse hypokinesis. The study was not   technically sufficient to allow evaluation of LV diastolic   dysfunction due to atrial fibrillation. - Regional wall motion abnormality: Akinesis of the apical septal   and apical myocardium; moderate hypokinesis of the basal-mid   anteroseptal myocardium. - Aortic valve: Mildly calcified leaflets. - Mitral valve: Moderately calcified annulus. Normal thickness   leaflets . - Left atrium: The atrium was mildly dilated. - Right ventricle: Pacer wire or catheter noted in right ventricle.   Systolic function was mildly reduced. -  Tricuspid valve: There was mild regurgitation. -  Pulmonary arteries: PA peak pressure: 38 mm Hg (S). - Systemic veins: IVC is dilated wtih normal respiratory variation.   Estimated CVP 8 mmHg.   Laboratory Data:  Chemistry  Recent Labs Lab 12/15/16 2039 12/16/16 0248 12/17/16 0430  NA 146* 148* 148*  K 3.7 3.6 3.5  CL 91* 92* 90*  CO2 47* 47* 48*  GLUCOSE 101* 86 97  BUN 43* 38* 26*  CREATININE 1.79* 1.61* 1.36*  CALCIUM 8.4* 8.6* 8.7*  GFRNONAA 39* 45* 55*  GFRAA 45* 52* >60  ANIONGAP 8 9 10      Recent Labs Lab 12/12/16 0809 12/15/16 0449 12/15/16 2039  PROT 7.2 6.4* 6.2*  ALBUMIN 3.7 3.1* 2.9*  AST 29 157* 112*  ALT 23 91* 84*  ALKPHOS 149* 176* 166*  BILITOT 1.4* 1.7* 1.4*   Hematology  Recent Labs Lab 12/16/16 1425 12/17/16 0430 12/17/16 0848  WBC 5.6 6.5 6.6  RBC 3.17* 3.56* 3.55*  HGB 8.9* 10.0* 10.1*  HCT 31.9* 35.3* 35.3*  MCV 100.6* 99.2 99.4  MCH 28.1 28.1 28.5  MCHC 27.9* 28.3* 28.6*  RDW 14.9 16.4* 16.3*  PLT 184 191 188   Cardiac EnzymesNo results for input(s): TROPONINI in the last 168 hours. No results for input(s): TROPIPOC in the last 168 hours.  BNP  Recent Labs Lab 12/11/16 2330  BNP 583.0*    DDimer No results for input(s): DDIMER in the last 168 hours.  Radiology/Studies:  Dg Chest Port 1 View  Result Date: 12/17/2016 CLINICAL DATA:  61 year old male with respiratory failure. Shortness of breath. EXAM: PORTABLE CHEST 1 VIEW COMPARISON:  12/16/2016 and earlier. FINDINGS: Portable AP semi upright view at at 0402 hours. Stable right PICC line. Stable lung volumes. Stable cardiomegaly and mediastinal contours. Stable left chest cardiac AICD. Veiling opacity at the right lung base. Continued increased pulmonary vascularity. Mildly improved retrocardiac ventilation on the left with residual peribronchial opacity. No pneumothorax. Negative visible bowel gas pattern. IMPRESSION: 1. Right pleural effusion suspected with stable  increased bilateral interstitial opacity. Favor interstitial edema over viral/atypical respiratory infection. 2. Regressed but not resolved left lung base atelectasis. Electronically Signed   By: Genevie Ann M.D.   On: 12/17/2016 07:09   Dg Chest Port 1 View  Result Date: 12/16/2016 CLINICAL DATA:  Hypoxia an acute respiratory failure. EXAM: PORTABLE CHEST 1 VIEW COMPARISON:  12/15/2016 FINDINGS: Pacemaker/AICD remains in place. Previous median sternotomy. Right arm PICC tip in the SVC just above the right atrium. Interstitial and alveolar edema persist, with a small amount of pleural fluid on the right. IMPRESSION: Persistent congestive heart failure with pulmonary edema. Electronically Signed   By: Nelson Chimes M.D.   On: 12/16/2016 11:45   Dg Chest Port 1 View  Result Date: 12/15/2016 CLINICAL DATA:  Congestive heart failure EXAM: PORTABLE CHEST 1 VIEW COMPARISON:  12/14/2016 FINDINGS: Postoperative changes in the mediastinum. Cardiac pacemaker. Shallow inspiration with atelectasis in the lung bases. Cardiac enlargement. Perihilar infiltration likely represents edema although multifocal pneumonia could also have this appearance. Can't exclude a small right pleural effusion. No apparent pneumothorax. Similar appearance to previous study, lying for differences in technique appear IMPRESSION: Cardiac enlargement with bilateral perihilar infiltrates likely edema. Shallow inspiration with atelectasis in the lung bases. Electronically Signed   By: Lucienne Capers M.D.   On: 12/15/2016 21:10   Dg Chest Port 1v Same Day  Result Date: 12/14/2016 CLINICAL DATA:  Dyspnea. EXAM: PORTABLE CHEST 1 VIEW COMPARISON:  12/11/2016. FINDINGS: Low lung volumes. Semierect portable technique.  Marked enlargement of cardiac silhouette. Dual lead pacing device. BILATERAL pulmonary opacities are noted, worse from priors, representing either pulmonary edema or BILATERAL pneumonia. IMPRESSION: Worsening aeration.  See discussion  above. Electronically Signed   By: Staci Righter M.D.   On: 12/14/2016 12:09    Assessment and Plan:   1. Paroxysmal atrial fibrillation - home meds include amiodarone, coreg, and eliquis - agree with holding eliquis in the setting of GI bleed - continue amiodarone and coreg - per GI, pt is now having minimal bleeding and may be hemorrhoidal  - will consider restarting eliquis tomorrow - pending GI recommendations - will hold off on heparin drip at this time   2. Acute on chronic systolic and diastolic heart failure - echo with LVEF 35-40% - BNP on admission was 583 in the setting of CKD stage III - home medications include coreg, imdur, spironolactone - home diuretic: bumex 2 mg BID - currently on 80 mg IV lasix BID - he is diuresing well: overall net negative 11.7L with 2.7 L urine output yesterday - he states his current lower extremity edema is below his baseline - continue this diuresis for now   3. CAD s/p PCI and CABG - home meds include ASA and statin - Pt states CABG was in 2001 in [place], records unavailable - he has not had intervention since that time, per patient - he denies chest pain - no further ischemic evaluation    4. CKD stage III - sCr 1.36, baseline unknown - making urine - follow labs    For questions or updates, please contact [place] Please consult [email or web address] for contact info under Cardiology/STEMI.   Signed, Tami Lin Duke, PA  12/17/2016 1:48 PM  Attending Note:   The patient was seen and examined.  Agree with assessment and plan as noted above.  Changes made to the above note as needed.  Patient seen and independently examined with Doreene Adas, PA .   We discussed all aspects of the encounter. I agree with the assessment and plan as stated above.  1.   Acute on chronic combined CHF - has been seen in consultation by our [facility] team on OCt. 3, 2018.   His hx is very unreliable.   He contradicts himself  frequently. Reports that he was at [facility] 2 weeks ago  - we have no records in care everywhere. Echo on 10/3 shows moderate LV dysfunction with EF 35-40%.  Could not eval diastolic dysfunction due to atrial fib.  He has diuresed nicely .  Creatinine has improved as he has been diuresed. Cont. Current meds.     2.   Hx of ICD:   No acute issues. We can interrogate it as OP.     3.  Atrial flutter:  I had the ICD interrogated by [employer] ( [employer] device rep) The patient has been in atrial flutter for several weeks and has had significant volume issues since that time. Unfortunately, he has been having GI bleeding and his DOAC has been held.   We do not have any safe options at this point to cardiovert him  Continue with rate control.   Would restart DOAC once clear from a GI standpoint .   4.  Altered mental status:   Hx is very unreliable.   Continue supportive care for now     I have spent a total of 40 minutes with patient reviewing hospital  notes , telemetry, EKGs, labs and examining patient  as well as establishing an assessment and plan that was discussed with the patient. > 50% of time was spent in direct patient care.    Thayer Headings, Brooke Bonito., MD, [facility] 12/17/2016, 3:15 PM [address]  [place] Pager [phone]

## 2016-12-17 NOTE — Consult Note (Signed)
[facility] Consult: 2:07 PM 12/17/2016  LOS: 5 days    Referring Provider: Dr Titus Mould  Primary Care Physician:  Renata Caprice, DO Primary Gastroenterologist:  Dr. Laural Golden     Attending physician's note   I have taken a history, examined the patient and reviewed the chart. I agree with the Advanced Practitioner's note, impression and recommendations. 61 year old male with multiple co-morbidities including CAD status post CABG, A. Fib on Eliquis, was admitted to [facility] with bright red blood per rectum. Patient transferred here for further management. Hemoglobin stable at 10. Passing brown stool with scant amount of bright red blood suggestive of possible hemorrhoidal bleed. Per patient he may have had colonoscopy in 2015 though unclear where it was done. We'll try to obtain more information.  Anusol suppository daily at bedtime. Plan for colonoscopy if unable to obtain reports of prior colonoscopy or continues to have persistent rectal bleeding Clear liquids Hold Eliquis for now  Damaris Hippo, MD [phone] Mon-Fri 8a-5p [phone] after 5p, weekends, holidays  Reason for Consultation:  Passing BRB per rectum.     HPI: Darrell Black is a 61 y.o. male.  He has a large list of chronic medical problems. Ischemic cardiomyopathy with systolic and diastolic heart failure. Coronary artery disease, previous cardiac stents and previous bypass.  Sleep apnea. CKD. NIDDM. Morbid obesity. Atrial fibrillation. On chronic Eliquis which is currently on hold. Patient lives at [facility], [place].  He was transferred there this summer because of debilitation following a hospitalization somewhere near [place], [place] at which time he had his gallbladder removed and sounds like he had an ERCP as well. Search of care  everywhere records do not come up with any corresponding records.  His niece confirms his gallbladder issues.  Patient originally presented to emergency department with hallucinations.   Apparently pt had self discontinued stopped all of his depression/antipsychotic meds. At the ED at [facility], he was given meds to calm him down and ended up over sedated requiring BiPAP.  Because of his tenuous respiratory status he was transferred down to [facility]. Notes made of him having bleeding from his mouth due to multiple small oral lacerations.  He was seen by Dr. Melony Overly regarding hematochezia. Hemoglobin changed from 9.5 to 8.8 over the course of a couple of days, Eliquis was held. He was not having hematemesis.  On DRE there was scant amount of blood clot present.  Dr Laural Golden suspected diverticular versus hemorrhoidal versus rectal trauma versus intestinal ischemia. as the cause of the bleeding.  The anesthesia service at [facility] refused to provide anesthesia for such a high risk patient so endoscopy was not possible there.  Today the patient just had a large, formed, medium brown stool and there was a scant amount of blood accompanying this. Patient tells me that he's had intermittent rectal bleeding for a few years. He tells me he had a colonoscopy around the year 2015 he thinks this was when he was living in [place].   Pt received 1 U PRBCs, Today hgb is  10.1. MCV is 99. Platelets are not diminished at 188.       Past Medical History:  Diagnosis Date  . Atherosclerosis   . Atrial fibrillation ([place])   . 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 Chest pain   . Chronic combined systolic and diastolic heart failure ([place])   . Chronic pain   . Coronary artery disease   . 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 Hyperlipidemia   . Hypertension    essential  . Ischemic cardiomyopathy   . Major depressive disorder, recurrent ([place])   . Morbid  obesity due to excess calories ([place])   . Muscle weakness (generalized)   . Neuropathy   . Obstructive sleep apnea   . Presence of automatic cardioverter/defibrillator (AICD)   . Renal disorder    Chronic kidney disease  . Restless leg syndrome   . Sick-euthyroid syndrome     History reviewed. No pertinent surgical history.  Prior to Admission medications   Medication Sig Start Date End Date Taking? Authorizing Provider  acetaminophen (TYLENOL) 325 MG tablet Take 650 mg by mouth every 6 (six) hours as needed.   Yes [provider]  amiodarone (PACERONE) 200 MG tablet Take 200 mg by mouth daily.   Yes [provider]  apixaban (ELIQUIS) 5 MG TABS tablet Take 5 mg by mouth 2 (two) times daily.   Yes [provider]  aspirin EC 81 MG tablet Take 81 mg by mouth daily.   Yes [provider]  atorvastatin (LIPITOR) 80 MG tablet Take 80 mg by mouth every evening.   Yes [provider]  bumetanide (BUMEX) 2 MG tablet Take 2 mg by mouth 2 (two) times daily. *May take one additional tablet every 12 hours as needed for increase in edema/shortness of breath   Yes [provider]  carvedilol (COREG) 3.125 MG tablet Take 3.125 mg by mouth 2 (two) times daily with a meal.   Yes [provider]  docusate sodium (COLACE) 100 MG capsule Take 100 mg by mouth daily.   Yes [provider]  DULoxetine (CYMBALTA) 30 MG capsule Take 30 mg by mouth every evening.   Yes [provider]  gabapentin (NEURONTIN) 100 MG capsule Take 100 mg by mouth 3 (three) times daily.   Yes [provider]  guaifenesin (ROBITUSSIN) 100 MG/5ML syrup Take 200 mg by mouth 3 (three) times daily as needed for cough or congestion.   Yes [provider]  isosorbide mononitrate (IMDUR) 60 MG 24 hr tablet Take 60 mg by mouth daily.   Yes [provider]  magnesium oxide (MAG-OX) 400 MG tablet Take 400 mg by mouth daily.   Yes [provider]  nitroGLYCERIN (NITROSTAT) 0.4 MG SL tablet Place 0.4 mg under the tongue every 5 (five) minutes as needed for chest pain.   Yes [provider]  oxycodone (OXY-IR) 5 MG capsule Take 5 mg by mouth every 4 (four) hours as needed for pain.   Yes [provider]  rOPINIRole (REQUIP) 1 MG tablet Take 1 mg by mouth at bedtime.   Yes [provider]  senna (SENOKOT) 8.6 MG TABS tablet Take 1 tablet by mouth at bedtime.   Yes [provider]  spironolactone (ALDACTONE) 12.5 mg TABS tablet Take 12.5 mg by mouth daily.   Yes [provider]  sulfamethoxazole-trimethoprim (BACTRIM DS,SEPTRA DS) 800-160 MG tablet Take 1 tablet by mouth 2 (  two) times daily. Starting on 12/08/2016-to be completed on 12/17/2016   Yes [provider]  tiotropium (SPIRIVA) 18 MCG inhalation capsule Place 18 mcg into inhaler and inhale daily.   Yes [provider]    Scheduled Meds: . amiodarone  200 mg Oral Daily  . atorvastatin  80 mg Oral q1800  . carvedilol  3.125 mg Oral BID WC  . chlorhexidine  15 mL Mouth Rinse BID  . Chlorhexidine Gluconate Cloth  6 each Topical Daily  . furosemide  80 mg Intravenous Q12H  . insulin aspart  2-6 Units Subcutaneous Q4H  . mouth rinse  15 mL Mouth Rinse q12n4p  . pantoprazole (PROTONIX) IV  40 mg Intravenous BH-q7a  . potassium chloride  40 mEq Oral BID  . sodium chloride flush  10-40 mL Intracatheter Q12H   Infusions: .  ceFAZolin (ANCEF) IV Stopped (12/17/16 1241)  . magnesium sulfate 1 - 4 g bolus IVPB 2 g (12/17/16 1322)   PRN Meds: acetaminophen, albuterol, metoprolol tartrate, nitroGLYCERIN, ondansetron **OR** ondansetron (ZOFRAN) IV, sodium chloride flush   Allergies as of 12/10/2016 - Review Complete 12/10/2016  Allergen Reaction Noted  . Ampicillin Rash 12/10/2016  . Clindamycin/lincomycin Rash 12/10/2016  . Penicillins Rash 12/10/2016    Family History  Problem Relation Age of Onset  .  Hypertension Father     Social History   Social History  . Marital status: Single    Spouse name: N/A  . Number of children: N/A  . Years of education: N/A   Occupational History  . Not on file.   Social History Main Topics  . Smoking status: Never Smoker  . Smokeless tobacco: Never Used  . Alcohol use Not on file  . Drug use: Unknown  . Sexual activity: Not on file   Other Topics Concern  . Not on file   Social History Narrative  . No narrative on file    REVIEW OF SYSTEMS: Constitutional:  Patient is essentially bedridden. Says that he walks around with a cane but I'm not convinced of this. ENT:  No nose bleeds Pulm:  Stable dyspnea on exertion and cough. CV:  No palpitations, no LE edema. No chest pain GU:  No hematuria, no frequency GI:  Denies dysphagia. Denies nausea or vomiting. Heme:  Bruises easily.   Transfusions:  Patient does not recall previous history of transfusions. Neuro:  No headaches, no peripheral tingling or numbness Derm:  No itching, no rash or sores.  Endocrine:  No sweats or chills.  No polyuria or dysuria Immunization: Received his flu shot 4 days ago.  Travel:  None beyond local counties in last few months.    PHYSICAL EXAM: Vital signs in last 24 hours: Vitals:   12/17/16 1300 12/17/16 1400  BP: 116/80 124/82  Pulse: 81 86  Resp: (!) 29 20  Temp:    SpO2: 94% 97%   Wt Readings from Last 3 Encounters:  12/17/16 (!) 140.2 kg (309 lb)    General: Ill looking, super morbidly obese WM Head:  There are signs of trauma with a laceration across the bridge of his nose  Eyes:  No scleral icterus, no conjunctival pallor. Ears:  Not hard of hearing  Nose:  No discharge or congestion. Mouth:  Oropharynx moist and clear.  Tongue midline Neck:  Obese. No JVD, no thyromegaly. Lungs:  Diminished diffusely. No adventitious sounds. No labored breathing. Occasional cough. Heart: RRR. No MRG. S1, S2 present.  Sinus rhythm in the seventies on  the monitor.  Well-healed sternotomy scar. Abdomen:  Obese, massive. Not tender. Not distended. Does have a few small clean dry and intact scars suggestive of recent laparotomy..   Rectal: Stool was medium brown, large, formed, soft. There was a scant amount of blood separate from the stool. On visual exam he has what look like internal hemorrhoids that are protruding just a little bit to where they are visible. Did not perform digital exam.   Extremities:  Bilateral violaceous, erythematous, swollen lower legs consistent with venous stasis. There are some small sores on the lower legs as well.  Neurologic:  Patient is alert. He is somewhat confused and is not giving an accurate history but is aware of the year and that he is in the hospital.  Moves all 4 limbs, no tremor. Skin:  Cellulitis versus stasis dermatitis in the lower extremities. Nodes:  No cervical adenopathy.   Psych:  Somewhat pressured speech. Appropriate. Cooperative. Follows all commands.  Intake/Output from previous day: 10/07 0701 - 10/08 0700 In: 625 [Blood:275; IV Piggyback:150] Out: 2700 [Urine:2700] Intake/Output this shift: Total I/O In: 170 [Other:120; IV Piggyback:50] Out: 600 [Urine:600]  LAB RESULTS:  Recent Labs  12/16/16 1425 12/17/16 0430 12/17/16 0848  WBC 5.6 6.5 6.6  HGB 8.9* 10.0* 10.1*  HCT 31.9* 35.3* 35.3*  PLT 184 191 188   BMET Lab Results  Component Value Date   NA 148 (H) 12/17/2016   NA 148 (H) 12/16/2016   NA 146 (H) 12/15/2016   K 3.5 12/17/2016   K 3.6 12/16/2016   K 3.7 12/15/2016   CL 90 (L) 12/17/2016   CL 92 (L) 12/16/2016   CL 91 (L) 12/15/2016   CO2 48 (H) 12/17/2016   CO2 47 (H) 12/16/2016   CO2 47 (H) 12/15/2016   GLUCOSE 97 12/17/2016   GLUCOSE 86 12/16/2016   GLUCOSE 101 (H) 12/15/2016   BUN 26 (H) 12/17/2016   BUN 38 (H) 12/16/2016   BUN 43 (H) 12/15/2016   CREATININE 1.36 (H) 12/17/2016   CREATININE 1.61 (H) 12/16/2016   CREATININE 1.79 (H) 12/15/2016    CALCIUM 8.7 (L) 12/17/2016   CALCIUM 8.6 (L) 12/16/2016   CALCIUM 8.4 (L) 12/15/2016   LFT  Recent Labs  12/15/16 0449 12/15/16 2039  PROT 6.4* 6.2*  ALBUMIN 3.1* 2.9*  AST 157* 112*  ALT 91* 84*  ALKPHOS 176* 166*  BILITOT 1.7* 1.4*   PT/INR Lab Results  Component Value Date   INR 1.90 12/15/2016   INR 2.46 12/14/2016   INR 1.91 12/11/2016   Hepatitis Panel No results for input(s): HEPBSAG, HCVAB, HEPAIGM, HEPBIGM in the last 72 hours. C-Diff No components found for: CDIFF Lipase  No results found for: LIPASE  Drugs of Abuse     Component Value Date/Time   LABOPIA NONE DETECTED 12/10/2016 2120   COCAINSCRNUR NONE DETECTED 12/10/2016 2120   LABBENZ NONE DETECTED 12/10/2016 2120   AMPHETMU NONE DETECTED 12/10/2016 2120   THCU NONE DETECTED 12/10/2016 2120   LABBARB NONE DETECTED 12/10/2016 2120     RADIOLOGY STUDIES: Dg Chest Port 1 View  Result Date: 12/17/2016 CLINICAL DATA:  61 year old male with respiratory failure. Shortness of breath. EXAM: PORTABLE CHEST 1 VIEW COMPARISON:  12/16/2016 and earlier. FINDINGS: Portable AP semi upright view at at 0402 hours. Stable right PICC line. Stable lung volumes. Stable cardiomegaly and mediastinal contours. Stable left chest cardiac AICD. Veiling opacity at the right lung base. Continued increased pulmonary vascularity. Mildly improved retrocardiac ventilation on the  left with residual peribronchial opacity. No pneumothorax. Negative visible bowel gas pattern. IMPRESSION: 1. Right pleural effusion suspected with stable increased bilateral interstitial opacity. Favor interstitial edema over viral/atypical respiratory infection. 2. Regressed but not resolved left lung base atelectasis. Electronically Signed   By: Genevie Ann M.D.   On: 12/17/2016 07:09   Dg Chest Port 1 View  Result Date: 12/16/2016 CLINICAL DATA:  Hypoxia an acute respiratory failure. EXAM: PORTABLE CHEST 1 VIEW COMPARISON:  12/15/2016 FINDINGS: Pacemaker/AICD  remains in place. Previous median sternotomy. Right arm PICC tip in the SVC just above the right atrium. Interstitial and alveolar edema persist, with a small amount of pleural fluid on the right. IMPRESSION: Persistent congestive heart failure with pulmonary edema. Electronically Signed   By: Nelson Chimes M.D.   On: 12/16/2016 11:45   Dg Chest Port 1 View  Result Date: 12/15/2016 CLINICAL DATA:  Congestive heart failure EXAM: PORTABLE CHEST 1 VIEW COMPARISON:  12/14/2016 FINDINGS: Postoperative changes in the mediastinum. Cardiac pacemaker. Shallow inspiration with atelectasis in the lung bases. Cardiac enlargement. Perihilar infiltration likely represents edema although multifocal pneumonia could also have this appearance. Can't exclude a small right pleural effusion. No apparent pneumothorax. Similar appearance to previous study, lying for differences in technique appear IMPRESSION: Cardiac enlargement with bilateral perihilar infiltrates likely edema. Shallow inspiration with atelectasis in the lung bases. Electronically Signed   By: Lucienne Capers M.D.   On: 12/15/2016 21:10     IMPRESSION:   *  Hematochezia.  Suspect hemorrhoidal vs anal fissure/trauma related  *  Macrocytic anemia. Status post PRBC times 1.  *  CHF.  Acute hypoxic respiratory failure, improved on BiPAP. Chest x-ray shows right pleural effusion and stable bilateral interstitial opacity favoring interstitial edema versus infection.  *  AKI.    *  Afib, Eliquis on hold.  Cardiology wondering when it can be restarted.    *  AMS, psychosis improved.  Patient was awaiting transfer to psychiatric facility when he developed respiratory instability. Suspect that recent discontinuation of psych meds contributed to the initial problem and sedatives given to try to calm him down led to the acute encephalopathy.    PLAN:     *  Per Dr Silverio Decamp.    *  ? Colonoscopy?   Azucena Freed  12/17/2016, 2:07 PM Pager:  [phone]
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
## 2016-12-17 NOTE — Progress Notes (Signed)
This note also relates to the following rows which could not be included: Pulse Rate - Cannot attach notes to unvalidated device data Resp - Cannot attach notes to unvalidated device data SpO2 - Cannot attach notes to unvalidated device data  Pt is on 2L [place] at this time and resting comfortably. BiPAP is not indicated at this time. RT will continue to monitor pt and will place if needed.

## 2016-12-17 NOTE — Progress Notes (Signed)
[facility] P[facility]M  S: small bloody stools overnight but overall much better, mental status improved greatly; slept with BIPAP O:  Vitals:   12/17/16 0900 12/17/16 1000 12/17/16 1100 12/17/16 1200  BP: 115/77     Pulse: 82 89 82 87  Resp: (!) 29 20 20  (!) 30  Temp:      TempSrc:      SpO2: 92% 94% 97% (!) 89%  Weight:      Height:       General:  Resting comfortably in bed HENT: NCAT OP clear PULM: CTA B, normal effort CV: RRR, no mgr GI: BS+, soft, nontender MSK: normal bulk and tone Neuro: awake, alert, no distress, MAEW  BMET    Component Value Date/Time   NA 148 (H) 12/17/2016 0430   K 3.5 12/17/2016 0430   CL 90 (L) 12/17/2016 0430   CO2 48 (H) 12/17/2016 0430   GLUCOSE 97 12/17/2016 0430   BUN 26 (H) 12/17/2016 0430   CREATININE 1.36 (H) 12/17/2016 0430   CALCIUM 8.7 (L) 12/17/2016 0430   GFRNONAA 55 (L) 12/17/2016 0430   GFRAA >60 12/17/2016 0430     Impression/Plan AKI: improved with blood, lasix, continue diuresis Acute systolic heart failure: continue lasix, consult cardiology, continue tele Afib: tele, keep K > 4, Mg > 2, continue amiodarone, coreg GI bleeding: consult GI here, not briskly bleeding, Hgb stable Acute encephalopathy: resolved with treatment of hypercarbia: contineu BIPAP qHS Psychosis: not seen by me, IVC papers signed at [facility], will need psyche re-evaluation here  Transfer to [place], [place] PCCM Pager: [phone] Cell: [phone] After 3pm or if no response, call [phone]
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
## 2016-12-18 DIAGNOSIS — E870 Hyperosmolality and hypernatremia: Secondary | ICD-10-CM | POA: Diagnosis not present

## 2016-12-18 DIAGNOSIS — F332 Major depressive disorder, recurrent severe without psychotic features: Secondary | ICD-10-CM

## 2016-12-18 LAB — GLUCOSE, CAPILLARY
GLUCOSE-CAPILLARY: 135 mg/dL — AB (ref 65–99)
GLUCOSE-CAPILLARY: 163 mg/dL — AB (ref 65–99)
GLUCOSE-CAPILLARY: 90 mg/dL (ref 65–99)
GLUCOSE-CAPILLARY: 91 mg/dL (ref 65–99)
Glucose-Capillary: 97 mg/dL (ref 65–99)
Glucose-Capillary: 169 mg/dL — ABNORMAL HIGH (ref 65–99)

## 2016-12-18 MED ORDER — PEG-KCL-NACL-NASULF-NA ASC-C 100 G PO SOLR: 1.0000 | Freq: Once | ORAL | Status: DC

## 2016-12-18 MED ORDER — PEG-KCL-NACL-NASULF-NA ASC-C 100 G PO SOLR
0.5000 | Freq: Once | ORAL | Status: DC
  Filled 2016-12-18: qty 1

## 2016-12-18 MED ORDER — BISACODYL 5 MG PO TBEC
5.0000 mg | DELAYED_RELEASE_TABLET | Freq: Once | ORAL | Status: AC
  Administered 2016-12-18: 5 mg via ORAL
  Filled 2016-12-18: qty 1

## 2016-12-18 MED ORDER — METOCLOPRAMIDE HCL 5 MG/ML IJ SOLN: 10.0000 mg | Freq: Once | INTRAMUSCULAR | Status: DC

## 2016-12-18 MED ORDER — METOCLOPRAMIDE HCL 5 MG/ML IJ SOLN
10.0000 mg | Freq: Once | INTRAMUSCULAR | Status: AC
  Administered 2016-12-18: 10 mg via INTRAVENOUS
  Filled 2016-12-18 (×2): qty 2

## 2016-12-18 MED ORDER — PEG-KCL-NACL-NASULF-NA ASC-C 100 G PO SOLR
0.5000 | Freq: Once | ORAL | Status: AC
  Administered 2016-12-18: 100 g via ORAL
  Filled 2016-12-18: qty 1

## 2016-12-18 NOTE — Consult Note (Signed)
[facility] Consult   Reason for Consult:  Depression with psychosis Referring Physician:  Dr. Maryland Pink Patient Identification: Darrell Black MRN:  [id number] Principal Diagnosis: Acute respiratory insufficiency Diagnosis:   Patient Active Problem List   Diagnosis Date Noted  . Rectal bleeding [K62.5]   . Acute on chronic respiratory failure with hypoxia and hypercapnia (HCC) [J96.21, J96.22]   . Visual hallucination [R44.1]   . Acute systolic congestive heart failure ([place]) [I50.21]   . Acute on chronic systolic congestive heart failure ([place]) [I50.23]   . 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 Acute respiratory insufficiency [R06.89] 12/12/2016  . Obstructive sleep apnea [G47.33] 12/12/2016  . Acute respiratory failure with hypoxia and hypercarbia (HCC) [J96.01, J96.02] 12/12/2016  . Acute psychosis ([place]) [F23] 12/12/2016  . Atrial fibrillation ([place]) [I48.91] 12/12/2016  . Oral bleeding [K13.79] 12/12/2016  . Coronary artery disease [I25.10] 12/12/2016  . Restless leg syndrome [G25.81] 12/12/2016  . Diabetes mellitus without complication ([place]) [Y64.1] 12/12/2016  . Hypertension [I10] 12/12/2016  . Hyperlipidemia [E78.5] 12/12/2016  . Chronic combined systolic and diastolic heart failure ([place]) [I50.42] 12/12/2016  . Hyperammonemia ([place]) [E72.20] 12/12/2016    Total Time spent with patient: 1 hour  Subjective:   Darrell Black is a 61 y.o. male patient admitted with visual and tactile hallucinations.  HPI: Darrell Black is a 61 years old male admitted to [facility] for progressively worsening visual hallucinations for the 4 days. Patient was seen, chart reviewed and case discussed with Dr. Mariea Clonts. Patient is awake, alert, oriented to time place person and situation. Patient reported he was seen visual hallucinations like spiders crawling on the wall and contents 2 days ago. Patient  denies current symptoms of depression, anxiety, mania, panic episodes, auditory/visual/tactile hallucinations, delusions and paranoia Patient denies current suicidal/homicidal ideation, intention or plans. Patient has a diagnosis of depression and was received hydroxyzine from the outpatient psychiatrist for about a month. It is not clear for me patient was received a psychotropic medication risperidone or not while placed in the [facility] emergency department or intensive care unit because of medically compromised and required BiPAP admission.  Reviewed psychiatric nurse practitioners progress note on 12/13/2016 we'll given recommendation to start gabapentin, Risperdal, hydroxyzine, Cogentin and continue Requip and lorazepam. Patient medication has been discontinued because of excessive sedation and difficulty breathing as per documentation. Patient is willing to follow up with outpatient psychiatric services and medically stable. Patient is willing to continue without any psychotropic medication at this time.  Past Psychiatric History: Depression reportedly received antidepressant medication Cymbalta which caused him hallucinations which was discontinued 2 days ago with the physicianpermission.  Risk to Self: Suicidal Ideation: No Suicidal Intent: No Is patient at risk for suicide?: No Suicidal Plan?: No Access to Means: No What has been your use of drugs/alcohol within the last 12 months?: None How many times?: 0 Other Self Harm Risks: None Triggers for Past Attempts: None known Intentional Self Injurious Behavior: None Risk to Others: Homicidal Ideation: No Thoughts of Harm to Others: No Current Homicidal Intent: No Current Homicidal Plan: No Access to Homicidal Means: No Identified Victim: No one History of harm to others?: No Assessment of Violence: None Noted Violent Behavior Description: None reported Does patient have access to weapons?: No Criminal Charges Pending?: No Does  patient have a court date: No Prior Inpatient Therapy: Prior Inpatient Therapy: No Prior Therapy Dates: N/A Prior Therapy Facilty/Provider(s): N/A Reason  for Treatment: N/A Prior Outpatient Therapy: Prior Outpatient Therapy: No Prior Therapy Dates: None Prior Therapy Facilty/Provider(s): None Reason for Treatment: NOne Does patient have an ACCT team?: No Does patient have Intensive In-House Services?  : No Does patient have [facility] services? : No Does patient have P4CC services?: No  Past Medical History:  Past Medical History:  Diagnosis Date  . Atherosclerosis   . Atrial fibrillation (HCC)   . 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 Chest pain   . Chronic combined systolic and diastolic heart failure (HCC)   . Chronic pain   . Coronary artery disease   . 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 Hyperlipidemia   . Hypertension    essential  . Ischemic cardiomyopathy   . Major depressive disorder, recurrent (HCC)   . Morbid obesity due to excess calories (HCC)   . Muscle weakness (generalized)   . Neuropathy   . Obstructive sleep apnea   . Presence of automatic cardioverter/defibrillator (AICD)   . Renal disorder    Chronic kidney disease  . Restless leg syndrome   . Sick-euthyroid syndrome    History reviewed. No pertinent surgical history. Family History:  Family History  Problem Relation Age of Onset  . Hypertension Father    Family Psychiatric  History: unknown Social History:  History  Alcohol use Not on file     History  Drug use: Unknown    Social History   Social History  . Marital status: Single    Spouse name: N/A  . Number of children: N/A  . Years of education: N/A   Social History Main Topics  . Smoking status: Never Smoker  . Smokeless tobacco: Never Used  . Alcohol use None  . Drug use: Unknown  . Sexual activity: Not Asked   Other Topics Concern  . None   Social History Narrative  .  None   Additional Social History:    Allergies:   Allergies  Allergen Reactions  . Ampicillin Rash  . Clindamycin/Lincomycin Rash  . Penicillins Rash    Has patient had a PCN reaction causing immediate rash, facial/tongue/throat swelling, SOB or lightheadedness with hypotension: Yes Has patient had a PCN reaction causing severe rash involving mucus membranes or skin necrosis: No Has patient had a PCN reaction that required hospitalization: No Has patient had a PCN reaction occurring within the last 10 years: No If all of the above answers are "NO", then may proceed with Cephalosporin use.     Labs:  Results for orders placed or performed during the hospital encounter of 12/10/16 (from the past 48 hour(s))  CBC with Differential/Platelet     Status: Abnormal   Collection Time: 12/16/16  2:25 PM  Result Value Ref Range   WBC 5.6 4.0 - 10.5 K/uL   RBC 3.17 (L) 4.22 - 5.81 MIL/uL   Hemoglobin 8.9 (L) 13.0 - 17.0 g/dL   HCT 70.7 (L) 86.7 - 54.4 %   MCV 100.6 (H) 78.0 - 100.0 fL   MCH 28.1 26.0 - 34.0 pg   MCHC 27.9 (L) 30.0 - 36.0 g/dL   RDW 92.0 10.0 - 71.2 %   Platelets 184 150 - 400 K/uL   Neutrophils Relative % 76 %   Neutro Abs 4.3 1.7 - 7.7 K/uL   Lymphocytes Relative 10 %   Lymphs Abs 0.5 (L) 0.7 - 4.0 K/uL   Monocytes Relative 10 %   Monocytes Absolute 0.5  0.1 - 1.0 K/uL   Eosinophils Relative 4 %   Eosinophils Absolute 0.2 0.0 - 0.7 K/uL   Basophils Relative 0 %   Basophils Absolute 0.0 0.0 - 0.1 K/uL  Lactic acid, plasma     Status: None   Collection Time: 12/16/16  3:50 PM  Result Value Ref Range   Lactic Acid, Venous 1.0 0.5 - 1.9 mmol/L  .Cooxemetry Panel (carboxy, met, total hgb, O2 sat)     Status: Abnormal   Collection Time: 12/16/16  4:05 PM  Result Value Ref Range   Total hemoglobin 9.3 (L) 12.0 - 16.0 g/dL   O2 Saturation 68.7 %   Carboxyhemoglobin 1.5 0.5 - 1.5 %   Methemoglobin 1.2 0.0 - 1.5 %    Comment: Performed at [facility], [place] [address], [place], [place] [address]  Glucose, capillary     Status: Abnormal   Collection Time: 12/16/16  4:21 PM  Result Value Ref Range   Glucose-Capillary 100 (H) 65 - 99 mg/dL   Comment 1 Capillary Specimen    Comment 2 Notify RN   Prepare RBC     Status: None   Collection Time: 12/16/16  4:22 PM  Result Value Ref Range   Order Confirmation ORDER PROCESSED BY BLOOD BANK   Glucose, capillary     Status: Abnormal   Collection Time: 12/16/16 11:00 PM  Result Value Ref Range   Glucose-Capillary 102 (H) 65 - 99 mg/dL   Comment 1 Notify RN   Glucose, capillary     Status: None   Collection Time: 12/17/16  3:53 AM  Result Value Ref Range   Glucose-Capillary 94 65 - 99 mg/dL   Comment 1 Notify RN   CBC with Differential/Platelet     Status: Abnormal   Collection Time: 12/17/16  4:30 AM  Result Value Ref Range   WBC 6.5 4.0 - 10.5 K/uL   RBC 3.56 (L) 4.22 - 5.81 MIL/uL   Hemoglobin 10.0 (L) 13.0 - 17.0 g/dL   HCT 35.3 (L) 39.0 - 52.0 %   MCV 99.2 78.0 - 100.0 fL   MCH 28.1 26.0 - 34.0 pg   MCHC 28.3 (L) 30.0 - 36.0 g/dL   RDW 16.4 (H) 11.5 - 15.5 %   Platelets 191 150 - 400 K/uL   Neutrophils Relative % 78 %   Neutro Abs 5.1 1.7 - 7.7 K/uL   Lymphocytes Relative 9 %   Lymphs Abs 0.6 (L) 0.7 - 4.0 K/uL   Monocytes Relative 10 %   Monocytes Absolute 0.6 0.1 - 1.0 K/uL   Eosinophils Relative 3 %   Eosinophils Absolute 0.2 0.0 - 0.7 K/uL   Basophils Relative 0 %   Basophils Absolute 0.0 0.0 - 0.1 K/uL  Basic metabolic panel     Status: Abnormal   Collection Time: 12/17/16  4:30 AM  Result Value Ref Range   Sodium 148 (H) 135 - 145 mmol/L   Potassium 3.5 3.5 - 5.1 mmol/L   Chloride 90 (L) 101 - 111 mmol/L   CO2 48 (H) 22 - 32 mmol/L   Glucose, Bld 97 65 - 99 mg/dL   BUN 26 (H) 6 - 20 mg/dL   Creatinine, Ser 1.36 (H) 0.61 - 1.24 mg/dL   Calcium 8.7 (L) 8.9 - 10.3 mg/dL   GFR calc non Af Amer 55 (L) >60 mL/min   GFR calc Af Amer >60 >60 mL/min    Comment:  (NOTE) The eGFR has been calculated using  the CKD EPI equation. This calculation has not been validated in all clinical situations. eGFR's persistently <60 mL/min signify possible Chronic Kidney Disease.    Anion gap 10 5 - 15  Magnesium     Status: Abnormal   Collection Time: 12/17/16  4:30 AM  Result Value Ref Range   Magnesium 1.6 (L) 1.7 - 2.4 mg/dL  Phosphorus     Status: None   Collection Time: 12/17/16  4:30 AM  Result Value Ref Range   Phosphorus 2.7 2.5 - 4.6 mg/dL  Ammonia     Status: None   Collection Time: 12/17/16  4:30 AM  Result Value Ref Range   Ammonia 20 9 - 35 umol/L  CBC with Differential/Platelet     Status: Abnormal   Collection Time: 12/17/16  8:48 AM  Result Value Ref Range   WBC 6.6 4.0 - 10.5 K/uL   RBC 3.55 (L) 4.22 - 5.81 MIL/uL   Hemoglobin 10.1 (L) 13.0 - 17.0 g/dL   HCT 35.3 (L) 39.0 - 52.0 %   MCV 99.4 78.0 - 100.0 fL   MCH 28.5 26.0 - 34.0 pg   MCHC 28.6 (L) 30.0 - 36.0 g/dL   RDW 16.3 (H) 11.5 - 15.5 %   Platelets 188 150 - 400 K/uL   Neutrophils Relative % 76 %   Neutro Abs 5.0 1.7 - 7.7 K/uL   Lymphocytes Relative 8 %   Lymphs Abs 0.6 (L) 0.7 - 4.0 K/uL   Monocytes Relative 11 %   Monocytes Absolute 0.7 0.1 - 1.0 K/uL   Eosinophils Relative 4 %   Eosinophils Absolute 0.3 0.0 - 0.7 K/uL   Basophils Relative 1 %   Basophils Absolute 0.0 0.0 - 0.1 K/uL  Glucose, capillary     Status: None   Collection Time: 12/17/16 11:54 AM  Result Value Ref Range   Glucose-Capillary 87 65 - 99 mg/dL   Comment 1 Capillary Specimen    Comment 2 Notify RN   Glucose, capillary     Status: Abnormal   Collection Time: 12/17/16  7:57 PM  Result Value Ref Range   Glucose-Capillary 122 (H) 65 - 99 mg/dL   Comment 1 Notify RN   Glucose, capillary     Status: Abnormal   Collection Time: 12/18/16 12:25 AM  Result Value Ref Range   Glucose-Capillary 163 (H) 65 - 99 mg/dL   Comment 1 Notify RN   Glucose, capillary     Status: None   Collection Time:  12/18/16  4:26 AM  Result Value Ref Range   Glucose-Capillary 91 65 - 99 mg/dL   Comment 1 Notify RN   Glucose, capillary     Status: None   Collection Time: 12/18/16  8:07 AM  Result Value Ref Range   Glucose-Capillary 97 65 - 99 mg/dL   Comment 1 Notify RN   Glucose, capillary     Status: Abnormal   Collection Time: 12/18/16 11:30 AM  Result Value Ref Range   Glucose-Capillary 169 (H) 65 - 99 mg/dL   Comment 1 Capillary Specimen     Current Facility-Administered Medications  Medication Dose Route Frequency Provider Last Rate Last Dose  . acetaminophen (TYLENOL) tablet 650 mg  650 mg Oral Q6H PRN Opyd, Ilene Qua, MD      . albuterol (PROVENTIL) (2.5 MG/3ML) 0.083% nebulizer solution 2.5 mg  2.5 mg Nebulization Q2H PRN Reubin Milan, MD      . amiodarone (PACERONE) tablet 200 mg  200 mg Oral  Daily Jennelle Human B, NP   200 mg at 12/18/16 2241  . atorvastatin (LIPITOR) tablet 80 mg  80 mg Oral q1800 Jennelle Human B, NP   80 mg at 12/17/16 1712  . carvedilol (COREG) tablet 3.125 mg  3.125 mg Oral BID WC Jennelle Human B, NP   3.125 mg at 12/18/16 0756  . ceFAZolin (ANCEF) IVPB 1 g/50 mL premix  1 g Intravenous Q8H Omar Person, NP   Stopped at 12/18/16 0704  . Chlorhexidine Gluconate Cloth 2 % PADS 6 each  6 each Topical Daily Oswald Hillock, MD   6 each at 12/18/16 1000  . furosemide (LASIX) injection 80 mg  80 mg Intravenous Q12H Simonne Maffucci B, MD   80 mg at 12/18/16 1207  . insulin aspart (novoLOG) injection 2-6 Units  2-6 Units Subcutaneous Q4H Omar Person, NP   4 Units at 12/18/16 1207  . metoprolol tartrate (LOPRESSOR) injection 5 mg  5 mg Intravenous Q6H PRN Omar Person, NP      . nitroGLYCERIN (NITROSTAT) SL tablet 0.4 mg  0.4 mg Sublingual Q5 min PRN Reubin Milan, MD      . ondansetron [facility]) tablet 4 mg  4 mg Oral Q6H PRN Reubin Milan, MD       Or  . ondansetron [facility]) injection 4 mg  4 mg Intravenous Q6H PRN Reubin Milan, MD      . pantoprazole (PROTONIX) injection 40 mg  40 mg Intravenous Vassie Loll, MD   40 mg at 12/18/16 0755  . sodium chloride flush (NS) 0.9 % injection 10-40 mL  10-40 mL Intracatheter Q12H Darrick Meigs, Marge Duncans, MD   10 mL at 12/18/16 1000  . sodium chloride flush (NS) 0.9 % injection 10-40 mL  10-40 mL Intracatheter PRN Oswald Hillock, MD        Musculoskeletal: Strength & Muscle Tone: decreased Gait & Station: unable to stand Patient leans: N/A  Psychiatric Specialty Exam: Physical Exam s per history and physical  ROS  No Fever-chills, No Headache, No changes with Vision or hearing, reports vertigo No problems swallowing food or Liquids, No Chest pain, Cough or Shortness of Breath, No Abdominal pain, No Nausea or Vommitting, Bowel movements are regular, No Blood in stool or Urine, No dysuria, No new skin rashes or bruises, No new joints pains-aches,  No new weakness, tingling, numbness in any extremity, No recent weight gain or loss, No polyuria, polydypsia or polyphagia,  A full 10 point Review of Systems was done, except as stated above, all other Review of Systems were negative.  Blood pressure 110/82, pulse 79, temperature 98.4 F (36.9 C), temperature source Oral, resp. rate 13, height 5' 8" (1.727 m), weight (!) 140.2 kg (309 lb), SpO2 95 %.Body mass index is 46.98 kg/m.  General Appearance: Casual  Eye Contact:  Good  Speech:  Pressured  Volume:  Normal  Mood:  Euthymic  Affect:  Appropriate and Congruent  Thought Process:  Coherent and Goal Directed  Orientation:  Full (Time, Place, and Person)  Thought Content:  Logical  Suicidal Thoughts:  No  Homicidal Thoughts:  No  Memory:  Immediate;   Fair Recent;   Fair Remote;   Fair  Judgement:  Intact  Insight:  Good  Psychomotor Activity:  Normal  Concentration:  Concentration: Fair and Attention Span: Fair  Recall:  [place] of Knowledge:  Good  Language:  Good  Akathisia:  Negative  Handed:  Right  AIMS (if indicated):     Assets:  Communication Skills Desire for Improvement Financial Resources/Insurance Housing Leisure Time Resilience Social Support Transportation  ADL's:  Intact  Cognition:  WNL  Sleep:        Treatment Plan Summary:  61 years old male with multiple medical problems presented with shortness of breath and visual/tactile hallucinations, the patient also has a diagnosis of depression in addition to restless leg syndrome an on requip which can cause psychosis. Reportedly patient was received Cymbalta which caused him psychotic symptoms. Patient antidepressant medication was discontinued a few days ago and currently patient does not have symptoms of depression, anxiety as psychotic symptoms.   Major depressive disorder, recurrent with psychotic features Review of EKGs indicated prolonged QTc -QT/QTc 472/580 ms  Recommended no antipsychotic medication at this time Agree with no psychotropic medication during this hospitalization which is causing more difficulties with the breathing Appreciate psychiatric consultation and we sign off as of today Please contact [phone] or [phone] if needs further assistance   Disposition: Patient will be referred to the outpatient psychiatric medication management Patient does not meet criteria for psychiatric inpatient admission. Supportive therapy provided about ongoing stressors.  Ambrose Finland, MD 12/18/2016 12:14 PM

## 2016-12-18 NOTE — Progress Notes (Signed)
Progress Note  Patient Name: Darrell Black Date of Encounter: 12/18/2016  Primary Cardiologist: Vernon Prey Pasi at [facility]  Subjective   61 yo with hx of CAD, PCI, CABG, Afib. Has CHF Presented with CHF exacerbation and BI bleeding  Diuresing nicely  I/O -13.7 liters so far   Inpatient Medications    Scheduled Meds: . amiodarone  200 mg Oral Daily  . atorvastatin  80 mg Oral q1800  . carvedilol  3.125 mg Oral BID WC  . Chlorhexidine Gluconate Cloth  6 each Topical Daily  . furosemide  80 mg Intravenous Q12H  . insulin aspart  2-6 Units Subcutaneous Q4H  . pantoprazole (PROTONIX) IV  40 mg Intravenous BH-q7a  . sodium chloride flush  10-40 mL Intracatheter Q12H   Continuous Infusions: .  ceFAZolin (ANCEF) IV Stopped (12/18/16 0704)   PRN Meds: acetaminophen, albuterol, metoprolol tartrate, nitroGLYCERIN, ondansetron **OR** ondansetron (ZOFRAN) IV, sodium chloride flush   Vital Signs    Vitals:   12/18/16 0755 12/18/16 0800 12/18/16 0805 12/18/16 0900  BP: 120/90 118/90  113/76  Pulse: 81 86  82  Resp:  14  (!) 23  Temp:   97.9 F (36.6 C)   TempSrc:   Oral   SpO2:  94%  94%  Weight:      Height:        Intake/Output Summary (Last 24 hours) at 12/18/16 1103 Last data filed at 12/18/16 0805  Gross per 24 hour  Intake             1560 ml  Output             3340 ml  Net            -1780 ml   Filed Weights   12/15/16 1930 12/16/16 0530 12/17/16 1300  Weight: (!) 402 lb (182.3 kg) (!) 399 lb (181 kg) (!) 309 lb (140.2 kg)    Telemetry    Atrial fib  - Personally Reviewed  ECG     atrial fib  - Personally Reviewed  Physical Exam   GEN: No acute distress.  Obese, still has some degree of altered mental status  Neck: No JVD Cardiac: irreg. Irreg. , soft heart sound ,   Respiratory: Clear to auscultation bilaterally. GI: Soft, nontender, non-distended  MS: No edema; No deformity. Neuro:  generally weak .  Psych: altered mental  status    Labs    Chemistry Recent Labs Lab 12/12/16 0809  12/15/16 0449 12/15/16 2039 12/16/16 0248 12/17/16 0430  NA 139  < > 149* 146* 148* 148*  K 3.7  < > 4.2 3.7 3.6 3.5  CL 90*  < > 91* 91* 92* 90*  CO2 42*  < > 44* 47* 47* 48*  GLUCOSE 105*  < > 92 101* 86 97  BUN 35*  < > 51* 43* 38* 26*  CREATININE 1.90*  < > 2.20* 1.79* 1.61* 1.36*  CALCIUM 8.5*  < > 8.7* 8.4* 8.6* 8.7*  PROT 7.2  --  6.4* 6.2*  --   --   ALBUMIN 3.7  --  3.1* 2.9*  --   --   AST 29  --  157* 112*  --   --   ALT 23  --  91* 84*  --   --   ALKPHOS 149*  --  176* 166*  --   --   BILITOT 1.4*  --  1.7* 1.4*  --   --  GFRNONAA 36*  < > 31* 39* 45* 55*  GFRAA 42*  < > 35* 45* 52* >60  ANIONGAP 7  < > 14 8 9 10   < > = values in this interval not displayed.   Hematology Recent Labs Lab 12/16/16 1425 12/17/16 0430 12/17/16 0848  WBC 5.6 6.5 6.6  RBC 3.17* 3.56* 3.55*  HGB 8.9* 10.0* 10.1*  HCT 31.9* 35.3* 35.3*  MCV 100.6* 99.2 99.4  MCH 28.1 28.1 28.5  MCHC 27.9* 28.3* 28.6*  RDW 14.9 16.4* 16.3*  PLT 184 191 188    Cardiac EnzymesNo results for input(s): TROPONINI in the last 168 hours. No results for input(s): TROPIPOC in the last 168 hours.   BNP Recent Labs Lab 12/11/16 2330  BNP 583.0*     DDimer No results for input(s): DDIMER in the last 168 hours.   Radiology    Dg Chest Port 1 View  Result Date: 12/17/2016 CLINICAL DATA:  61 year old male with respiratory failure. Shortness of breath. EXAM: PORTABLE CHEST 1 VIEW COMPARISON:  12/16/2016 and earlier. FINDINGS: Portable AP semi upright view at at 0402 hours. Stable right PICC line. Stable lung volumes. Stable cardiomegaly and mediastinal contours. Stable left chest cardiac AICD. Veiling opacity at the right lung base. Continued increased pulmonary vascularity. Mildly improved retrocardiac ventilation on the left with residual peribronchial opacity. No pneumothorax. Negative visible bowel gas pattern. IMPRESSION: 1. Right  pleural effusion suspected with stable increased bilateral interstitial opacity. Favor interstitial edema over viral/atypical respiratory infection. 2. Regressed but not resolved left lung base atelectasis. Electronically Signed   By: Genevie Ann M.D.   On: 12/17/2016 07:09   Dg Chest Port 1 View  Result Date: 12/16/2016 CLINICAL DATA:  Hypoxia an acute respiratory failure. EXAM: PORTABLE CHEST 1 VIEW COMPARISON:  12/15/2016 FINDINGS: Pacemaker/AICD remains in place. Previous median sternotomy. Right arm PICC tip in the SVC just above the right atrium. Interstitial and alveolar edema persist, with a small amount of pleural fluid on the right. IMPRESSION: Persistent congestive heart failure with pulmonary edema. Electronically Signed   By: Nelson Chimes M.D.   On: 12/16/2016 11:45    Cardiac Studies      Patient Profile     61 y.o. male with acute on chronic systolic chf   Assessment & Plan    1. Acute on chronic systolic CHF:  Continue to diurese. Feeling better   2. Atrial fib:   Still  Apparently having some GI bleeding Once cleared by GI, we would favor restarting anticoagulation - heparin vs. eliquis   3.  CAD : no angina   4. Altered mental status. Plans per Int med.    For questions or updates, please contact [place] Please consult [email or web address] for contact info under Cardiology/STEMI.      Signed, Mertie Moores, MD  12/18/2016, 11:03 AM

## 2016-12-18 NOTE — Progress Notes (Signed)
PROGRESS NOTE  Darrell Black CLE:[id number] DOB: [birth year] DOA: 12/10/2016 PCP: Renata Caprice, DO  HPI/Recap of past 29 hours: 61 year old male with past medical history of atrial fibrillation, chronic systolic/diastolic heart failure, diabetes mellitus, morbid obesity and obstructive sleep apnea brought to the emergency room on 9/30 for visual hallucinations 4 days. Initially, patient waiting for transfer to psychiatric facility but over the next 24 hours, developed worsening dyspnea and sedation, possibly from her psychotropic medications. He started to become hypoxic and felt to be in volume overload. Admitted to the hospitalist service at [facility] and placed in stepdown on 10/3.  Patient started on IV diuretics and initially responded well, diuresing over 4 L. Followed by tele-psychiatry.  Following hospitalization, patient started having some mild bleeding from his gums, treated with tranexemic acid and his eliquis was held. He continued to remain hypoxic, requiring BiPAP.  On night of 10/5, patient started having some gross blood per rectum. Remained hemodynamically stable, but hemoglobin dropped from 9.52 days prior to 8.8 by next morning. No hematemesis. PICC line placed 10/6. Patient remained unresponsive on BiPAP. Seen by GI and it was recommended he be transferred to critical care at [facility]. Patient's lack of responsiveness was felt to be secondary to acute psychosis plus hypercapnia plus polypharmacy. By holding sedating medications, patient became more awake by 10/8. Felt to be in acute psychosis and psychiatry consulted. Cardiology also consulted for acute on chronic heart failure.  Transfer back to hospitalist service on 10/9 morning. Cardiology recommended continuing Lasix. Since admission, he is now 14 L diuresed. GI recommended holding eliquis and planning for colonoscopy if unable to get reports previous colonoscopy or rectal bleeding persisted. (Patient  continued to have intermittent episodes of bleeding from stool)    Assessment/Plan: Active Problems:   Obstructive sleep apnea   Acute respiratory failure with hypoxia and hypercarbia (HCC)   Acute psychosis (HCC)   Atrial fibrillation ([place]): Persistent but rate controlled area once cleared by GI, could restart anticoagulation, initially heparin and then perhaps eliquis once renal function resolved   Oral bleeding: Resolved   Coronary artery disease   Restless leg syndrome: Was on Neurontin & requip.  Both have been held secondary to somnolence. Patient turned to be symptomatic so we will just start Requip only   Diabetes mellitus without complication ([place]):? Maybe erroneous diagnosis. A1c at 5.4 and patient not on home medications.   Hypertension   Hyperlipidemia    Hyperammonemia (HCC)   Visual hallucination/acute psychoses: Psychiatry consulted. Waiting for patient to be seen. Previous psychiatric medications on hold secondary to oversedation.    Acute on chronic systolic congestive heart failure [facility]): Responding well to Lasix. Cardiology following. Now more than 14 L diuresed and 15 pounds down from admission.  Ejection fraction notes EF of 25-30 percent with atrial fibrillation and unable to ascertain diastolic dysfunction.    Acute renal failure/acute kidney injury with renal medullary necrosis superimposed on stage 3 chronic kidney disease ([place]): Secondary to poor perfusion. Creatinine has been steadily improving. Peaked his high as 2.2. Down to 1.36, improving with diuretics.    Rectal bleeding: Hemoglobin staying stable at 10.    Morbid obesity due to excess calories [facility]) patient meets criteria with BMI greater than 40.  Hypernatremia: Likely patient more diuresed. If sodium continues to trend upward, we'll taper back Lasix tomorrow   Code Status: Full code   Family Communication: No family present, once patient is more alert we will get clearance to call  Disposition  Plan: Continue in stepdown until blood pressures improved    Consultants:  GI at [facility]   cardiology  GI  Psychiatry    Procedures:  PICC line 10/6   Antimicrobials:  \None   DVT prophylaxis:  SCDs   Objective: Vitals:   12/18/16 1131 12/18/16 1200 12/18/16 1303 12/18/16 1400  BP:  110/82 118/77 100/70  Pulse:  79 81 81  Resp:  _0 Temp: 98.4 F (36.9 C)     TempSrc: Oral     SpO2:  95% 94% 97%  Weight:      Height:        Intake/Output Summary (Last 24 hours) at 12/18/16 1834 Last data filed at 12/18/16 1520  Gross per 24 hour  Intake             1520 ml  Output             2450 ml  Net             -930 ml   Filed Weights   12/15/16 1930 12/16/16 0530 12/17/16 1300  Weight: (!) 182.3 kg (402 lb) (!) 181 kg (399 lb) (!) 140.2 kg (309 lb)    Exam:   General:  Awake, still somewhat confused at times. Oriented 1  HEENT: Normocephalic major medical mucous members are dry  Neck: Supple, mild JVD   Cardiovascular: Irregular rhythm, rate controlled   Respiratory: Decreased breath sounds bibasilar , Breathing not labored   Abdomen: Soft, obese, nontender, positive bowel sounds   Musculoskeletal: Clubbing or cyanosis, 1+ pitting edema from the knees down   Skin: No skin breaks, tears or lesions   Psychiatry: Patient still with some signs of encephalopathy  Neuro: No focal deficits    Data Reviewed: CBC:  Recent Labs Lab 12/15/16 2039 12/16/16 0248 12/16/16 1425 12/17/16 0430 12/17/16 0848  WBC 5.5 6.1 5.6 6.5 6.6  NEUTROABS 4.4 4.9 4.3 5.1 5.0  HGB 8.7* 8.9* 8.9* 10.0* 10.1*  HCT 30.9* 31.0* 31.9* 35.3* 35.3*  MCV 101.0* 100.3* 100.6* 99.2 99.4  PLT 180 170 184 191 423   Basic Metabolic Panel:  Recent Labs Lab 12/12/16 0809 12/13/16 0358 12/14/16 0355 12/15/16 0449 12/15/16 2039 12/16/16 0248 12/17/16 0430  NA 139 142 143 149* 146* 148* 148*  K 3.7 3.7 3.6 4.2 3.7 3.6 3.5  CL 90* 88* 88* 91*  91* 92* 90*  CO2 42* 45* 45* 44* 47* 47* 48*  GLUCOSE 105* 103* 80 92 101* 86 97  BUN 35* 39* 43* 51* 43* 38* 26*  CREATININE 1.90* 1.98* 1.95* 2.20* 1.79* 1.61* 1.36*  CALCIUM 8.5* 8.5* 8.4* 8.7* 8.4* 8.6* 8.7*  MG 1.5* 1.9  --   --   --  1.7 1.6*  PHOS 4.4  --   --   --   --  2.4* 2.7   GFR: Estimated Creatinine Clearance: 78.3 mL/min (A) (by C-G formula based on SCr of 1.36 mg/dL (H)). Liver Function Tests:  Recent Labs Lab 12/12/16 0809 12/15/16 0449 12/15/16 2039  AST 29 157* 112*  ALT 23 91* 84*  ALKPHOS 149* 176* 166*  BILITOT 1.4* 1.7* 1.4*  PROT 7.2 6.4* 6.2*  ALBUMIN 3.7 3.1* 2.9*   No results for input(s): LIPASE, AMYLASE in the last 168 hours.  Recent Labs Lab 12/11/16 1839 12/15/16 1313 12/15/16 2039 12/17/16 0430  AMMONIA 48* _1 Coagulation Profile:  Recent Labs Lab 12/11/16 1839 12/14/16  1944 12/15/16 2039  INR 1.91 2.46 1.90   Cardiac Enzymes: No results for input(s): CKTOTAL, CKMB, CKMBINDEX, TROPONINI in the last 168 hours. BNP (last 3 results) No results for input(s): PROBNP in the last 8760 hours. HbA1C: No results for input(s): HGBA1C in the last 72 hours. CBG:  Recent Labs Lab 12/18/16 0025 12/18/16 0426 12/18/16 0807 12/18/16 1130 12/18/16 1629  GLUCAP 163* 91 97 169* 135*   Lipid Profile: No results for input(s): CHOL, HDL, LDLCALC, TRIG, CHOLHDL, LDLDIRECT in the last 72 hours. Thyroid Function Tests: No results for input(s): TSH, T4TOTAL, FREET4, T3FREE, THYROIDAB in the last 72 hours. Anemia Panel: No results for input(s): VITAMINB12, FOLATE, FERRITIN, TIBC, IRON, RETICCTPCT in the last 72 hours. Urine analysis: No results found for: COLORURINE, APPEARANCEUR, [place], [place], GLUCOSEU, HGBUR, BILIRUBINUR, KETONESUR, PROTEINUR, UROBILINOGEN, NITRITE, LEUKOCYTESUR Sepsis Labs: _0 (procalcitonin:4,lacticidven:4)  ) Recent Results (from the past 240 hour(s))  MRSA PCR Screening     Status: None    Collection Time: 12/12/16  6:47 AM  Result Value Ref Range Status   MRSA by PCR NEGATIVE NEGATIVE Final    Comment:        The GeneXpert MRSA Assay (FDA approved for NASAL specimens only), is one component of a comprehensive MRSA colonization surveillance program. It is not intended to diagnose MRSA infection nor to guide or monitor treatment for MRSA infections.       Studies: No results found.  Scheduled Meds: . amiodarone  200 mg Oral Daily  . atorvastatin  80 mg Oral q1800  . bisacodyl  5 mg Oral Once  . carvedilol  3.125 mg Oral BID WC  . Chlorhexidine Gluconate Cloth  6 each Topical Daily  . furosemide  80 mg Intravenous Q12H  . insulin aspart  2-6 Units Subcutaneous Q4H  . metoCLOPramide (REGLAN) injection  10 mg Intravenous Once   Followed by  . [START ON 12/19/2016] metoCLOPramide (REGLAN) injection  10 mg Intravenous Once  . peg 3350 powder  0.5 kit Oral Once   And  . [START ON 12/19/2016] peg 3350 powder  0.5 kit Oral Once  . sodium chloride flush  10-40 mL Intracatheter Q12H    Continuous Infusions: .  ceFAZolin (ANCEF) IV Stopped (12/18/16 1254)     LOS: 6 days     Annita Brod, MD [facility] Pager [phone]  If 7PM-7AM, please contact night-coverage [email or web address] Password [facility] 12/18/2016, 6:34 PM

## 2016-12-18 NOTE — Progress Notes (Signed)
Attempted to call report. Nurse getting another admission will call when other admit is settled. Modena Morrow E, RN 12/18/2016 2:10 PM

## 2016-12-19 DIAGNOSIS — I480 Paroxysmal atrial fibrillation: Secondary | ICD-10-CM

## 2016-12-19 LAB — COMPREHENSIVE METABOLIC PANEL
ALK PHOS: 141 U/L — AB (ref 38–126)
ALT: 27 U/L (ref 17–63)
AST: 48 U/L — AB (ref 15–41)
Albumin: 3 g/dL — ABNORMAL LOW (ref 3.5–5.0)
Anion gap: 11 (ref 5–15)
BILIRUBIN TOTAL: 1.4 mg/dL — AB (ref 0.3–1.2)
BUN: 19 mg/dL (ref 6–20)
CALCIUM: 8.3 mg/dL — AB (ref 8.9–10.3)
CHLORIDE: 84 mmol/L — AB (ref 101–111)
CO2: 43 mmol/L — ABNORMAL HIGH (ref 22–32)
CREATININE: 1.28 mg/dL — AB (ref 0.61–1.24)
GFR, EST NON AFRICAN AMERICAN: 59 mL/min — AB (ref >60)
Glucose, Bld: 107 mg/dL — ABNORMAL HIGH (ref 65–99)
Potassium: 3.4 mmol/L — ABNORMAL LOW (ref 3.5–5.1)
Sodium: 138 mmol/L (ref 135–145)
Total Protein: 6.6 g/dL (ref 6.5–8.1)

## 2016-12-19 LAB — GLUCOSE, CAPILLARY
GLUCOSE-CAPILLARY: 107 mg/dL — AB (ref 65–99)
GLUCOSE-CAPILLARY: 83 mg/dL (ref 65–99)
GLUCOSE-CAPILLARY: 88 mg/dL (ref 65–99)
GLUCOSE-CAPILLARY: 122 mg/dL — AB (ref 65–99)
Glucose-Capillary: 101 mg/dL — ABNORMAL HIGH (ref 65–99)
Glucose-Capillary: 106 mg/dL — ABNORMAL HIGH (ref 65–99)

## 2016-12-19 LAB — CBC
HEMATOCRIT: 35.8 % — AB (ref 39.0–52.0)
HEMOGLOBIN: 10.4 g/dL — AB (ref 13.0–17.0)
MCH: 28 pg (ref 26.0–34.0)
MCHC: 29.1 g/dL — ABNORMAL LOW (ref 30.0–36.0)
MCV: 96.2 fL (ref 78.0–100.0)
PLATELETS: 234 10*3/uL (ref 150–400)
RBC: 3.72 MIL/uL — AB (ref 4.22–5.81)
RDW: 15.5 % (ref 11.5–15.5)
WBC: 6.5 10*3/uL (ref 4.0–10.5)

## 2016-12-19 MED ORDER — POTASSIUM CHLORIDE CRYS ER 20 MEQ PO TBCR
40.0000 meq | EXTENDED_RELEASE_TABLET | Freq: Two times a day (BID) | ORAL | Status: DC
  Administered 2016-12-19 – 2016-12-22 (×8): 40 meq via ORAL
  Filled 2016-12-19 (×8): qty 2

## 2016-12-19 MED ORDER — PEG-KCL-NACL-NASULF-NA ASC-C 100 G PO SOLR: 1.0000 | Freq: Once | ORAL | Status: DC

## 2016-12-19 MED ORDER — ROPINIROLE HCL 1 MG PO TABS: 1.0000 mg | ORAL_TABLET | Freq: Every day | ORAL | Status: DC

## 2016-12-19 MED ORDER — PEG-KCL-NACL-NASULF-NA ASC-C 100 G PO SOLR
1.0000 | Freq: Once | ORAL | Status: AC
  Administered 2016-12-19: 200 g via ORAL
  Filled 2016-12-19: qty 1

## 2016-12-19 NOTE — Progress Notes (Signed)
Poor progress with drinking prep.  Last BM was 2 hours ago, brown and watery.   Hgb stable at 10.4.  K a little low at 3.4.  Got potassium 40 meq po today   Postponing colonoscopy til tomorrow.  More prep ordered for tonite.    Azucena Freed PA-c  [phone]

## 2016-12-19 NOTE — Progress Notes (Addendum)
Progress Note  Patient Name: Darrell Black Date of Encounter: 12/19/2016  Primary Cardiologist: Dr. Vernon Prey [facility]), [facility])  Subjective   Pt is uncooperative with exam and interview this morning. Denies chest pain.  Inpatient Medications    Scheduled Meds: . amiodarone  200 mg Oral Daily  . atorvastatin  80 mg Oral q1800  . carvedilol  3.125 mg Oral BID WC  . Chlorhexidine Gluconate Cloth  6 each Topical Daily  . furosemide  80 mg Intravenous Q12H  . insulin aspart  2-6 Units Subcutaneous Q4H  . metoCLOPramide (REGLAN) injection  10 mg Intravenous Once  . peg 3350 powder  0.5 kit Oral Once  . potassium chloride  40 mEq Oral Q12H  . rOPINIRole  1 mg Oral QHS  . sodium chloride flush  10-40 mL Intracatheter Q12H   Continuous Infusions: .  ceFAZolin (ANCEF) IV 1 g (12/19/16 0554)   PRN Meds: acetaminophen, albuterol, metoprolol tartrate, nitroGLYCERIN, ondansetron **OR** ondansetron (ZOFRAN) IV, sodium chloride flush   Vital Signs    Vitals:   12/18/16 2344 12/19/16 0358 12/19/16 0824 12/19/16 1021  BP:   (!) 109/58   Pulse:   72 79  Resp: _0 Temp:   98.3 F (36.8 C)   TempSrc:   Oral   SpO2: 94% (!) 73% 91%   Weight:  (!) 316 lb 6.4 oz (143.5 kg)    Height:        Intake/Output Summary (Last 24 hours) at 12/19/16 1124 Last data filed at 12/19/16 0900  Gross per 24 hour  Intake              660 ml  Output             1175 ml  Net             -515 ml   Filed Weights   12/16/16 0530 12/17/16 1300 12/19/16 0358  Weight: (!) 399 lb (181 kg) (!) 309 lb (140.2 kg) (!) 316 lb 6.4 oz (143.5 kg)     Physical Exam   General: Well developed, well nourished, male appearing in no acute distress. Head: Normocephalic, atraumatic.  Neck: Supple without bruits, no JVD. Lungs:  Resp regular and unlabored, CTA. Heart: RRR, S1, S2, no murmur; no rub. Abdomen: Soft, non-tender, non-distended with normoactive bowel sounds. No hepatomegaly. No  rebound/guarding. No obvious abdominal masses. Extremities: No clubbing, cyanosis, 1+ edema with significant erythema and skin tears. Distal pedal pulses are faint bilaterally. Neuro: Alert and oriented X 3. Moves all extremities spontaneously. Psych: Irritable  Labs    Chemistry Recent Labs Lab 12/15/16 0449 12/15/16 2039 12/16/16 0248 12/17/16 0430 12/19/16 0434  NA 149* 146* 148* 148* 138  K 4.2 3.7 3.6 3.5 3.4*  CL 91* 91* 92* 90* 84*  CO2 44* 47* 47* 48* 43*  GLUCOSE 92 101* 86 97 107*  BUN 51* 43* 38* 26* 19  CREATININE 2.20* 1.79* 1.61* 1.36* 1.28*  CALCIUM 8.7* 8.4* 8.6* 8.7* 8.3*  PROT 6.4* 6.2*  --   --  6.6  ALBUMIN 3.1* 2.9*  --   --  3.0*  AST 157* 112*  --   --  48*  ALT 91* 84*  --   --  27  ALKPHOS 176* 166*  --   --  141*  BILITOT 1.7* 1.4*  --   --  1.4*  GFRNONAA 31* 39* 45* 55* 59*  GFRAA 35* 45* 52* >60 >60  ANIONGAP  _0 Hematology Recent Labs Lab 12/17/16 0430 12/17/16 0848 12/19/16 0434  WBC 6.5 6.6 6.5  RBC 3.56* 3.55* 3.72*  HGB 10.0* 10.1* 10.4*  HCT 35.3* 35.3* 35.8*  MCV 99.2 99.4 96.2  MCH 28.1 28.5 28.0  MCHC 28.3* 28.6* 29.1*  RDW 16.4* 16.3* 15.5  PLT 191 188 234    Cardiac EnzymesNo results for input(s): TROPONINI in the last 168 hours. No results for input(s): TROPIPOC in the last 168 hours.   BNPNo results for input(s): BNP, PROBNP in the last 168 hours.   DDimer No results for input(s): DDIMER in the last 168 hours.   Radiology    No results found.   Telemetry    Converted to NSR - Personally Reviewed  ECG    No new tracings - Personally Reviewed   Cardiac Studies   Echocardiogram 12/12/16 Study Conclusions - Procedure narrative: Contrast enhancement employed. - Left ventricle: The cavity size was normal. Wall thickness was increased in a pattern of mild LVH. Systolic function was moderately reduced. The estimated ejection fraction was in the range of 35% to 40%. Diffuse hypokinesis.  The study was not technically sufficient to allow evaluation of LV diastolic dysfunction due to atrial fibrillation. - Regional wall motion abnormality: Akinesis of the apical septal and apical myocardium; moderate hypokinesis of the basal-mid anteroseptal myocardium. - Aortic valve: Mildly calcified leaflets. - Mitral valve: Moderately calcified annulus. Normal thickness leaflets . - Left atrium: The atrium was mildly dilated. - Right ventricle: Pacer wire or catheter noted in right ventricle. Systolic function was mildly reduced. - Tricuspid valve: There was mild regurgitation. - Pulmonary arteries: PA peak pressure: 38 mm Hg (S). - Systemic veins: IVC is dilated wtih normal respiratory variation. Estimated CVP 8 mmHg.  Patient Profile     61 y.o. male with a hx of CAD s/p PCI and CABG (2001), ischemic cardiomyopathy with OSR with LVEF of 81%, chronic systolic and diastolic heart failure, AICD in place ([employer] placed in 2008 and revised in 2014, per patient), persistent Afib on elqiuis, HTN, HLD, morbid obesity, depression, DM, OSA, CKD stage 3 who is being seen for the evaluation of Afib and CHF in the setting of a lower GI bleed.  Assessment & Plan    1. Acute on chronic systolic heart failure - diuresing on 80 mg lasix IV BID - NPO for colonoscopy today - overall net negative 14L with 1.1 L urine output yesterday   2. Atrial fibrillation - continue holding AC in the setting of GI bleed - restart per GI - continue amiodarone - appears to have converted to NSR today   3. CAD - no chest pain   4. GI bleed, altered mental status  - per primary team - plan for coloscopy today, but may be postponed to tomorrow because patient is refusing bowel prep   Signed, Ledora Bottcher , PA-C 11:24 AM 12/19/2016 Pager: [phone]   Attending Note:   The patient was seen and examined.  Agree with assessment and plan as noted above.  Changes made to the  above note as needed.  Patient seen and independently examined with Doreene Adas, PA .   We discussed all aspects of the encounter. I agree with the assessment and plan as stated above.  1.  Acute on chronic systolic cHF:   Diuresing well Appears to be secondary to his rapid atrial fib He appears to have converted to NSR  And he  feels better I/O -14.5 liters so far this admission   2.  Attrial fib:   Appears on tele to have converted to NSR  Will get an ecg Will restart Eliquis when safe from a bleeding standpoint .     I have spent a total of 40 minutes with patient reviewing hospital  notes , telemetry, EKGs, labs and examining patient as well as establishing an assessment and plan that was discussed with the patient. > 50% of time was spent in direct patient care.    Thayer Headings, Brooke Bonito., MD, [facility] 12/19/2016, 12:50 PM [address],  [place] Pager [phone]

## 2016-12-19 NOTE — Progress Notes (Signed)
Pt refused second part of prep but is now drinking it.  Drank about 80 cc so far.   Stools not yet clear.  Postponed case to 2 PM.  Azucena Freed PA -C [phone].

## 2016-12-19 NOTE — Progress Notes (Signed)
PROGRESS NOTE    Darrell Black  JSE:[id number] DOB: Sep 08, 1955 DOA: 12/10/2016 PCP: Renata Caprice, DO   Brief Narrative: 61 year old male with past medical history of atrial fibrillation, chronic systolic/diastolic heart failure, diabetes mellitus, morbid obesity and obstructive sleep apnea brought to the emergency room on 9/30 for visual hallucinations 4 days. Initially, patient waiting for transfer to psychiatric facility but over the next 24 hours, developed worsening dyspnea and sedation, possibly from her psychotropic medications. He started to become hypoxic and felt to be in volume overload. Admitted to the hospitalist service at [facility] and placed in stepdown on 10/3.  Patient started on IV diuretics and initially responded well, diuresing over 4 L. Followed by tele-psychiatry.  Following hospitalization, patient started having some mild bleeding from his gums, treated with tranexemic acid and his eliquis was held. He continued to remain hypoxic, requiring BiPAP.  On night of 10/5, patient started having some gross blood per rectum. Remained hemodynamically stable, but hemoglobin dropped from 9.52 days prior to 8.8 by next morning. No hematemesis. PICC line placed 10/6. Patient remained unresponsive on BiPAP. Seen by GI and it was recommended he be transferred to critical care at [facility]. Patient's lack of responsiveness was felt to be secondary to acute psychosis plus hypercapnia plus polypharmacy. By holding sedating medications, patient became more awake by 10/8. Felt to be in acute psychosis and psychiatry consulted. Cardiology also consulted for acute on chronic heart failure.  Transfer back to hospitalist service on 10/9 morning. Cardiology recommended continuing Lasix. Since admission, he is now 14 L diuresed. GI recommended holding eliquis and planning for colonoscopy if unable to get reports previous colonoscopy or rectal bleeding persisted. 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  Improving. Off BIPAP.  Continue with IV lasix.   Acute on chronic systolic congestive heart failure ([place]):  -Ejection fraction notes EF of 25-30 percent with atrial fibrillation and unable to ascertain diastolic dysfunction. -Cardiology following.  -Continue with IV lasix. 80 mg BID IV  -negtaive 14 L.   Major depression with Psychotic feature.  Clear by psych .  Needs to follow up outpatient for medications management.  No antipsychotic medications at this time.  Hold Requip which could cause psychosis.   GI bleed, recta bleeding.  GI planing colonoscopy 10-11  Cellulitis; lower extremities;  On Ancef.   Acute renal failure/acute kidney injury; Secondary to poor perfusion. Creatinine has been steadily improving. Peaked his high as 2.2. Down to 1.36, improving with diuretics Visual hallucination/acute psychoses:  Afib; holding anticoagulation due to GI bleed.   Restless leg syndrome: Per Psych  Requip could cause psychosis, plan to hold that medication for now.   Diabetes mellitus without complication ([place]):? Maybe erroneous diagnosis. A1c at 5.4 and patient not on home medications.  Morbid obesity due to excess calories [facility]) patient meets criteria  with BMI greater than 40.  Hypernatremia; monitor on lasix. Resolved.   Hypokalemia; oral potassium supplements.   DVT prophylaxis: SCD. No anticoagulation due to GI bleed  Code Status: full code.  Family Communication: care discussed with patient.  Disposition Plan: home when stable.    Consultants:   GI  Cardiology    Procedures:  ECHO; Procedure narrative: Contrast enhancement employed. - Left ventricle: The cavity size was normal. Wall thickness was   increased in a pattern of mild LVH. Systolic function was   moderately reduced. The estimated ejection fraction was in the   range of 35% to 40%. Diffuse hypokinesis. The study was not   technically sufficient to allow evaluation of LV diastolic   dysfunction due to atrial fibrillation. - Regional wall motion abnormality: Akinesis of the apical septal   and apical myocardium; moderate hypokinesis of the basal-mid    anteroseptal myocardium.     Antimicrobials:   Ancef   Subjective: He report SOB while talking, but improved since admission. Notice more fluids LE.    Objective: Vitals:   12/18/16 1400 12/18/16 2344 12/19/16 0358 12/19/16 0824  BP: 100/70   (!) 109/58  Pulse: 81   72  Resp: 16 19 16 18   Temp:    98.3 F (36.8 C)  TempSrc:    Oral  SpO2: 97% 94% (!) 73% 91%  Weight:   (!) 143.5 kg (316 lb 6.4 oz)   Height:        Intake/Output Summary (Last 24 hours) at 12/19/16 0920 Last data filed at 12/19/16 0900  Gross per 24 hour  Intake              660 ml  Output             1175 ml  Net             -515 ml   Filed Weights   12/16/16 0530 12/17/16 1300 12/19/16 0358  Weight: (!) 181 kg (399 lb) (!) 140.2 kg (309 lb) (!) 143.5 kg (316 lb 6.4 oz)    Examination:  General exam: Appears calm and comfortable  Respiratory system: Clear to auscultation. Respiratory effort normal. Cardiovascular system: S1 & S2 heard, RRR. No JVD, murmurs, rubs, gallops or clicks. No pedal  edema. Gastrointestinal system: Abdomen is nondistended, soft and nontender. No organomegaly or masses felt. Normal bowel sounds heard. Central nervous system: Alert and oriented. No focal neurological deficits. Extremities: Symmetric 5 x 5 power. Skin: lower extremities with edema, redness.     Data Reviewed: I have personally reviewed following labs and imaging studies  CBC:  Recent Labs Lab 12/15/16 2039 12/16/16 0248 12/16/16 1425 12/17/16 0430 12/17/16 0848 12/19/16 0434  WBC 5.5 6.1 5.6 6.5 6.6 6.5  NEUTROABS 4.4 4.9 4.3 5.1 5.0  --   HGB 8.7* 8.9* 8.9* 10.0* 10.1* 10.4*  HCT 30.9* 31.0* 31.9* 35.3* 35.3* 35.8*  MCV 101.0* 100.3* 100.6* 99.2 99.4 96.2  PLT 180 170 184 191 188 262   Basic Metabolic Panel:  Recent Labs Lab 12/13/16 0358  12/15/16 0449 12/15/16 2039 12/16/16 0248 12/17/16 0430 12/19/16 0434  NA 142  < > 149* 146* 148* 148* 138  K 3.7  < > 4.2 3.7 3.6 3.5 3.4*  CL 88*  < > 91* 91* 92* 90* 84*  CO2 45*  < > 44* 47* 47* 48* 43*  GLUCOSE 103*  < > 92 101*  86 97 107*  BUN 39*  < > 51* 43* 38* 26* 19  CREATININE 1.98*  < > 2.20* 1.79* 1.61* 1.36* 1.28*  CALCIUM 8.5*  < > 8.7* 8.4* 8.6* 8.7* 8.3*  MG 1.9  --   --   --  1.7 1.6*  --   PHOS  --   --   --   --  2.4* 2.7  --   < > = values in this interval not displayed. GFR: Estimated Creatinine Clearance: 84.3 mL/min (A) (by C-G formula based on SCr of 1.28 mg/dL (H)). Liver Function Tests:  Recent Labs Lab 12/15/16 0449 12/15/16 2039 12/19/16 0434  AST 157* 112* 48*  ALT 91* 84* 27  ALKPHOS 176* 166* 141*  BILITOT 1.7* 1.4* 1.4*  PROT 6.4* 6.2* 6.6  ALBUMIN 3.1* 2.9* 3.0*   No results for input(s): LIPASE, AMYLASE in the last 168 hours.  Recent Labs Lab 12/15/16 1313 12/15/16 2039 12/17/16 0430  AMMONIA 21 24 20    Coagulation Profile:  Recent Labs Lab 12/14/16 1944 12/15/16 2039  INR 2.46 1.90   Cardiac Enzymes: No results for input(s): CKTOTAL, CKMB, CKMBINDEX, TROPONINI  in the last 168 hours. BNP (last 3 results) No results for input(s): PROBNP in the last 8760 hours. HbA1C: No results for input(s): HGBA1C in the last 72 hours. CBG:  Recent Labs Lab 12/18/16 1629 12/18/16 2013 12/19/16 0011 12/19/16 0424 12/19/16 0822  GLUCAP 135* 90 106* 107* 83   Lipid Profile: No results for input(s): CHOL, HDL, LDLCALC, TRIG, CHOLHDL, LDLDIRECT in the last 72 hours. Thyroid Function Tests: No results for input(s): TSH, T4TOTAL, FREET4, T3FREE, THYROIDAB in the last 72 hours. Anemia Panel: No results for input(s): VITAMINB12, FOLATE, FERRITIN, TIBC, IRON, RETICCTPCT in the last 72 hours. Sepsis Labs:  Recent Labs Lab 12/16/16 1550  LATICACIDVEN 1.0    Recent Results (from the past 240 hour(s))  MRSA PCR Screening     Status: None   Collection Time: 12/12/16  6:47 AM  Result Value Ref Range Status   MRSA by PCR NEGATIVE NEGATIVE Final    Comment:        The GeneXpert MRSA Assay (FDA approved for NASAL specimens only), is one component of a comprehensive MRSA colonization surveillance program. It is not intended to diagnose MRSA infection nor to guide or monitor treatment for MRSA infections.          Radiology Studies: No results found.      Scheduled Meds: . amiodarone  200 mg Oral Daily  . atorvastatin  80 mg Oral q1800  . carvedilol  3.125 mg Oral BID WC  . Chlorhexidine Gluconate Cloth  6 each Topical Daily  . furosemide  80 mg Intravenous Q12H  . insulin aspart  2-6 Units Subcutaneous Q4H  . metoCLOPramide (REGLAN) injection  10 mg Intravenous Once  . peg 3350 powder  0.5 kit Oral Once  . sodium chloride flush  10-40 mL Intracatheter Q12H   Continuous Infusions: .  ceFAZolin (ANCEF) IV 1 g (12/19/16 0554)     LOS: 7 days    Time spent: 35 minutes.     Elmarie Shiley, MD [facility] Pager [phone]  If 7PM-7AM, please contact night-coverage [email or web address] Password [email or web address] 12/19/2016, 9:20 AM

## 2016-12-19 NOTE — Care Management Note (Addendum)
Case Management Note Marvetta Gibbons RN, BSN Unit 4E-Case Manager [phone]  Patient Details  Name: [name] MRN: [id number] Date of Birth: [birth year]  Subjective/Objective:   Pt admitted for hallucination and was transferred from [facility] due to lower GI bleeding- required BIPAP, has been encephalopathic                  Action/Plan: PTA pt was at [place] to follow for placement needs- pt and family have concerns about the [facility] and patients care there- would not want pt to return to [facility]  Expected Discharge Date:                  Expected Discharge Plan:  [place]  In-House Referral:  Clinical Social Work  Discharge planning Services  CM Consult  Post Acute Care Choice:    Choice offered to:     DME Arranged:    DME Agency:     HH Arranged:    [place] Agency:     Status of Service:  In process, will continue to follow  If discussed at Long Length of Stay Meetings, dates discussed:    Discharge Disposition:   Additional Comments:  Dawayne Patricia, RN 12/19/2016, 12:15 PM

## 2016-12-19 NOTE — Progress Notes (Signed)
Patient stated that he could not tolerate the liter of prep. Patient was having a hard getting the first one down. Stated that he felt like was going to get sick if he had to drink anymore. Will continue to monitor

## 2016-12-20 ENCOUNTER — Inpatient Hospital Stay (HOSPITAL_COMMUNITY): Payer: Medicaid Other | Admitting: Anesthesiology

## 2016-12-20 ENCOUNTER — Encounter (HOSPITAL_COMMUNITY): Payer: Self-pay | Admitting: *Deleted

## 2016-12-20 ENCOUNTER — Encounter (HOSPITAL_COMMUNITY)
Admission: EM | Disposition: A | Discharge status: Skilled Nursing Facility | Payer: Self-pay | Source: Home / Self Care | Attending: Internal Medicine

## 2016-12-20 DIAGNOSIS — D125 Benign neoplasm of sigmoid colon: Secondary | ICD-10-CM

## 2016-12-20 DIAGNOSIS — K635 Polyp of colon: Secondary | ICD-10-CM

## 2016-12-20 DIAGNOSIS — K648 Other hemorrhoids: Secondary | ICD-10-CM

## 2016-12-20 HISTORY — PX: COLONOSCOPY: SHX5424

## 2016-12-20 LAB — GLUCOSE, CAPILLARY
GLUCOSE-CAPILLARY: 99 mg/dL (ref 65–99)
GLUCOSE-CAPILLARY: 88 mg/dL (ref 65–99)
GLUCOSE-CAPILLARY: 83 mg/dL (ref 65–99)
GLUCOSE-CAPILLARY: 402 mg/dL — AB (ref 65–99)
Glucose-Capillary: 104 mg/dL — ABNORMAL HIGH (ref 65–99)
Glucose-Capillary: 113 mg/dL — ABNORMAL HIGH (ref 65–99)
Glucose-Capillary: 94 mg/dL (ref 65–99)
Glucose-Capillary: 97 mg/dL (ref 65–99)

## 2016-12-20 SURGERY — COLONOSCOPY
Anesthesia: Monitor Anesthesia Care

## 2016-12-20 MED ORDER — INSULIN ASPART 100 UNIT/ML ~~LOC~~ SOLN
2.0000 [IU] | Freq: Three times a day (TID) | SUBCUTANEOUS | Status: DC
  Administered 2016-12-21: 4 [IU] via SUBCUTANEOUS

## 2016-12-20 MED ORDER — SODIUM CHLORIDE 0.9 % IV SOLN
INTRAVENOUS | Status: DC | PRN
  Administered 2016-12-20: 07:00:00 via INTRAVENOUS

## 2016-12-20 MED ORDER — PROPOFOL 500 MG/50ML IV EMUL
INTRAVENOUS | Status: DC | PRN
  Administered 2016-12-20: 25 ug/kg/min via INTRAVENOUS

## 2016-12-20 MED ORDER — KETAMINE HCL 10 MG/ML IJ SOLN
INTRAMUSCULAR | Status: DC | PRN
  Administered 2016-12-20: 20 mg via INTRAVENOUS

## 2016-12-20 MED ORDER — GABAPENTIN 100 MG PO CAPS
200.0000 mg | ORAL_CAPSULE | Freq: Every day | ORAL | Status: DC
  Administered 2016-12-20 – 2016-12-23 (×4): 200 mg via ORAL
  Filled 2016-12-20 (×4): qty 2

## 2016-12-20 MED ORDER — KETAMINE HCL-SODIUM CHLORIDE 100-0.9 MG/10ML-% IV SOSY
PREFILLED_SYRINGE | INTRAVENOUS | Status: AC
  Filled 2016-12-20: qty 10

## 2016-12-20 MED ORDER — DOXYCYCLINE HYCLATE 100 MG PO TABS
100.0000 mg | ORAL_TABLET | Freq: Two times a day (BID) | ORAL | Status: DC
  Administered 2016-12-20 (×2): 100 mg via ORAL
  Filled 2016-12-20 (×3): qty 1

## 2016-12-20 MED ORDER — DEXTROSE 5 % IV SOLN
INTRAVENOUS | Status: DC | PRN
  Administered 2016-12-20: 40 ug/min via INTRAVENOUS

## 2016-12-20 NOTE — Transfer of Care (Signed)
Immediate Anesthesia Transfer of Care Note  Patient: Darrell Black  Procedure(s) Performed: COLONOSCOPY (N/A )  Patient Location: PACU  Anesthesia Type:MAC  Level of Consciousness: awake, alert  and oriented  Airway & Oxygen Therapy: Patient Spontanous Breathing and Patient connected to nasal cannula oxygen  Post-op Assessment: Report given to RN, Post -op Vital signs reviewed and stable, Patient moving all extremities and Patient moving all extremities X 4  Post vital signs: Reviewed and stable  Last Vitals:  Vitals:   12/20/16 0541 12/20/16 0716  BP:  121/72  Pulse:  78  Resp: 19 19  Temp:  36.6 C  SpO2: (!) 89% 100%    Last Pain:  Vitals:   12/20/16 0716  TempSrc: Oral  PainSc:       Patients Stated Pain Goal: 2 (83/15/17 6160)  Complications: No apparent anesthesia complications

## 2016-12-20 NOTE — Anesthesia Procedure Notes (Signed)
Procedure Name: MAC Date/Time: 12/20/2016 8:29 AM Performed by: Izora Gala Pre-anesthesia Checklist: Patient identified Patient Re-evaluated:Patient Re-evaluated prior to induction Oxygen Delivery Method: Circle system utilized Preoxygenation: Pre-oxygenation with 100% oxygen Induction Type: IV induction Placement Confirmation: positive ETCO2

## 2016-12-20 NOTE — Progress Notes (Signed)
Suicide sitter discontinued per verbal order by Dr. Tyrell Antonio. Psych signed off yesterday per Dr. Barnetta Hammersmith note.  Fritz Pickerel, RN

## 2016-12-20 NOTE — Progress Notes (Signed)
Pharmacy Antibiotic Note  Darrell Black is a 61 y.o. male admitted on 12/10/2016 with cellulitis in both legs.  Pharmacy has been consulted for cefazolin dosing - on day 5 of therapy. WBC WNL and patient is afebrile. Patient does have a noted penicillin allergy in the past but tolerated ceftriaxone well. Scr is stable (~1.28 on 10/10) with CrCl ~ 70 ml/min.   Plan: Cefazolin 1 gm every 8 hours Monitor renal function  Monitor tolerance and clinical s/sx of infection  Consider stop date for order since on day 5 of therapy  Height: 5\' 8"  (172.7 cm) Weight: (!) 315 lb 12.8 oz (143.2 kg) IBW/kg (Calculated) : 68.4  Temp (24hrs), Avg:98 F (36.7 C), Min:97.7 F (36.5 C), Max:98.4 F (36.9 C)   Recent Labs Lab 12/15/16 0449 12/15/16 2039 12/16/16 0248 12/16/16 1425 12/16/16 1550 12/17/16 0430 12/17/16 0848 12/19/16 0434  WBC 6.5 5.5 6.1 5.6  --  6.5 6.6 6.5  CREATININE 2.20* 1.79* 1.61*  --   --  1.36*  --  1.28*  LATICACIDVEN  --   --   --   --  1.0  --   --   --     Estimated Creatinine Clearance: 84.3 mL/min (A) (by C-G formula based on SCr of 1.28 mg/dL (H)).    Allergies  Allergen Reactions  . Ampicillin Rash  . Clindamycin/Lincomycin Rash  . Penicillins Rash    Has patient had a PCN reaction causing immediate rash, facial/tongue/throat swelling, SOB or lightheadedness with hypotension: Yes Has patient had a PCN reaction causing severe rash involving mucus membranes or skin necrosis: No Has patient had a PCN reaction that required hospitalization: No Has patient had a PCN reaction occurring within the last 10 years: No If all of the above answers are "NO", then may proceed with Cephalosporin use.     Antimicrobials this admission: 10/6 Ceftriaxone X 1 dose  10/7 Cefazolin>>  Thank you for allowing pharmacy to be a part of this patient's care.  Doylene Canard, PharmD Clinical Pharmacist  Phone: [phone] 12/20/2016 11:21 AM

## 2016-12-20 NOTE — Progress Notes (Signed)
PT has PRN order for BIPAP. Pt in no distress, vital signs stable and BIPAP not in room. Advised pt and RN to call if status changes.

## 2016-12-20 NOTE — Op Note (Signed)
[facility] Patient Name: Darrell Black Procedure Date : 12/20/2016 MRN: [id number] Attending MD: Mauri Pole , MD Date of Birth: 08-Sep-1955 CSN: [id number] Age: 61 Admit Type: Inpatient Procedure:                Colonoscopy Indications:              Evaluation of unexplained GI bleeding Providers:                Mauri Pole, MD, Cleda Daub, RN, Elspeth Cho Tech., Technician, Izora Gala, CRNA Referring MD:              Medicines:                Monitored Anesthesia Care Complications:            No immediate complications. Estimated Blood Loss:     Estimated blood loss was minimal. Procedure:                Pre-Anesthesia Assessment:                           - Prior to the procedure, a History and Physical                            was performed, and patient medications and                            allergies were reviewed. The patient's tolerance of                            previous anesthesia was also reviewed. The risks                            and benefits of the procedure and the sedation                            options and risks were discussed with the patient.                            All questions were answered, and informed consent                            was obtained. Prior Anticoagulants: The patient                            last took Eliquis (apixaban) 7 days prior to the                            procedure. ASA Grade Assessment: III - A patient                            with severe systemic disease. After reviewing the  risks and benefits, the patient was deemed in                            satisfactory condition to undergo the procedure.                           After obtaining informed consent, the colonoscope                            was passed under direct vision. Throughout the                            procedure, the patient's blood pressure, pulse,  and                            oxygen saturations were monitored continuously. The                            EC-[id number] ([id number]) scope was introduced through                            the anus and advanced to the the terminal ileum,                            with identification of the appendiceal orifice and                            IC valve. The colonoscopy was performed without                            difficulty. The patient tolerated the procedure                            well. The quality of the bowel preparation was                            adequate. The terminal ileum, ileocecal valve,                            appendiceal orifice, and rectum were photographed. Scope In: 8:27:37 AM Scope Out: 8:48:12 AM Scope Withdrawal Time: 0 hours 12 minutes 26 seconds  Total Procedure Duration: 0 hours 20 minutes 35 seconds  Findings:      The perianal and digital rectal examinations were normal.      A 12 mm polyp was found in the sigmoid colon. The polyp was       pedunculated. The polyp was removed with a hot snare. Resection and       retrieval were complete.      Non-bleeding internal hemorrhoids were found during retroflexion. The       hemorrhoids were medium-sized.      The exam was otherwise without abnormality. Impression:               - One 12 mm polyp in the sigmoid colon, removed  with a hot snare. Resected and retrieved.                           - Non-bleeding internal hemorrhoids.                           - The examination was otherwise normal. Moderate Sedation:      N/A Recommendation:           - Patient has a contact number available for                            emergencies. The signs and symptoms of potential                            delayed complications were discussed with the                            patient. Return to normal activities tomorrow.                            Written discharge instructions were provided to  the                            patient.                           - Resume previous diet.                           - Continue present medications.                           - Resume Eliquis (apixaban) at prior dose tomorrow.                            Refer to managing physician for further adjustment                            of therapy.                           - Await pathology results.                           - Repeat colonoscopy in 3 years for surveillance                            based on pathology results.                           - Will sign off, please call with any questions Procedure Code(s):        --- Professional ---                           [phone], Colonoscopy, flexible; with removal of  tumor(s), polyp(s), or other lesion(s) by snare                            technique Diagnosis Code(s):        --- Professional ---                           D12.5, Benign neoplasm of sigmoid colon                           K64.8, Other hemorrhoids                           K92.2, Gastrointestinal hemorrhage, unspecified CPT copyright 2016 [employer]. All rights reserved. The codes documented in this report are preliminary and upon coder review may  be revised to meet current compliance requirements. Mauri Pole, MD 12/20/2016 8:55:01 AM This report has been signed electronically. Number of Addenda: 0

## 2016-12-20 NOTE — H&P (Signed)
[facility] History and Physical   Primary Care Physician:  Renata Caprice, DO   Reason for Procedure:   BRBPR, anemia Plan:    Colonoscopy with possible intervention     HPI: Darrell Black is a 61 y.o. male here for colonoscopy for evaluation of intermittent BRBPR.  The risks and benefits as well as alternatives of endoscopic procedure(s) have been discussed and reviewed. All questions answered. The patient agrees to proceed.   Past Medical History:  Diagnosis Date  . Atherosclerosis   . Atrial fibrillation ([place])   . 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 Chest pain   . Chronic combined systolic and diastolic heart failure ([place])   . Chronic pain   . Coronary artery disease   . 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 Hyperlipidemia   . Hypertension    essential  . Ischemic cardiomyopathy   . Major depressive disorder, recurrent ([place])   . Morbid obesity due to excess calories ([place])   . Muscle weakness (generalized)   . Neuropathy   . Obstructive sleep apnea   . Presence of automatic cardioverter/defibrillator (AICD)   . Renal disorder    Chronic kidney disease  . Restless leg syndrome   . Sick-euthyroid syndrome     History reviewed. No pertinent surgical history.  Prior to Admission medications   Medication Sig Start Date End Date Taking? Authorizing Provider  acetaminophen (TYLENOL) 325 MG tablet Take 650 mg by mouth every 6 (six) hours as needed.   Yes [provider]  amiodarone (PACERONE) 200 MG tablet Take 200 mg by mouth daily.   Yes [provider]  apixaban (ELIQUIS) 5 MG TABS tablet Take 5 mg by mouth 2 (two) times daily.   Yes [provider]  aspirin EC 81 MG tablet Take 81 mg by mouth daily.   Yes [provider]  atorvastatin (LIPITOR) 80 MG tablet Take 80 mg by mouth every evening.   Yes [provider]  bumetanide (BUMEX) 2 MG tablet Take 2 mg by mouth  2 (two) times daily. *May take one additional tablet every 12 hours as needed for increase in edema/shortness of breath   Yes [provider]  carvedilol (COREG) 3.125 MG tablet Take 3.125 mg by mouth 2 (two) times daily with a meal.   Yes [provider]  docusate sodium (COLACE) 100 MG capsule Take 100 mg by mouth daily.   Yes [provider]  DULoxetine (CYMBALTA) 30 MG capsule Take 30 mg by mouth every evening.   Yes [provider]  gabapentin (NEURONTIN) 100 MG capsule Take 100 mg by mouth 3 (three) times daily.   Yes [provider]  guaifenesin (ROBITUSSIN) 100 MG/5ML syrup Take 200 mg by mouth 3 (three) times daily as needed for cough or congestion.   Yes [provider]  isosorbide mononitrate (IMDUR) 60 MG 24 hr tablet Take 60 mg by mouth daily.   Yes [provider]  magnesium oxide (MAG-OX) 400 MG tablet Take 400 mg by mouth daily.   Yes [provider]  nitroGLYCERIN (NITROSTAT) 0.4 MG SL tablet Place 0.4 mg under the tongue every 5 (five) minutes as needed for chest pain.   Yes [provider]  oxycodone (OXY-IR) 5 MG capsule Take 5 mg by mouth every 4 (four) hours as needed for pain.   Yes [provider]  rOPINIRole (REQUIP) 1 MG tablet Take 1 mg by mouth  at bedtime.   Yes [provider]  senna (SENOKOT) 8.6 MG TABS tablet Take 1 tablet by mouth at bedtime.   Yes [provider]  spironolactone (ALDACTONE) 12.5 mg TABS tablet Take 12.5 mg by mouth daily.   Yes [provider]  sulfamethoxazole-trimethoprim (BACTRIM DS,SEPTRA DS) 800-160 MG tablet Take 1 tablet by mouth 2 (two) times daily. Starting on 12/08/2016-to be completed on 12/17/2016   Yes [provider]  tiotropium (SPIRIVA) 18 MCG inhalation capsule Place 18 mcg into inhaler and inhale daily.   Yes [provider]    Current Facility-Administered Medications  Medication Dose Route  Frequency Provider Last Rate Last Dose  . [MAR Hold] acetaminophen (TYLENOL) tablet 650 mg  650 mg Oral Q6H PRN Opyd, Ilene Qua, MD   650 mg at 12/19/16 0132  . [MAR Hold] albuterol (PROVENTIL) (2.5 MG/3ML) 0.083% nebulizer solution 2.5 mg  2.5 mg Nebulization Q2H PRN Reubin Milan, MD      . Doug Sou Hold] amiodarone (PACERONE) tablet 200 mg  200 mg Oral Daily Jennelle Human B, NP   200 mg at 12/19/16 1021  . [MAR Hold] atorvastatin (LIPITOR) tablet 80 mg  80 mg Oral q1800 Jennelle Human B, NP   80 mg at 12/19/16 1721  . [MAR Hold] carvedilol (COREG) tablet 3.125 mg  3.125 mg Oral BID WC Jennelle Human B, NP   3.125 mg at 12/19/16 1721  . [MAR Hold] ceFAZolin (ANCEF) IVPB 1 g/50 mL premix  1 g Intravenous Q8H Omar Person, NP 100 mL/hr at 12/20/16 0520 1 g at 12/20/16 0520  . [MAR Hold] Chlorhexidine Gluconate Cloth 2 % PADS 6 each  6 each Topical Daily Oswald Hillock, MD   6 each at 12/19/16 1000  . [MAR Hold] furosemide (LASIX) injection 80 mg  80 mg Intravenous Q12H Simonne Maffucci B, MD   80 mg at 12/20/16 0028  . [MAR Hold] insulin aspart (novoLOG) injection 2-6 Units  2-6 Units Subcutaneous Q4H Omar Person, NP   2 Units at 12/18/16 1710  . [MAR Hold] metoCLOPramide (REGLAN) injection 10 mg  10 mg Intravenous Once Vena Rua, PA-C      . [MAR Hold] metoprolol tartrate (LOPRESSOR) injection 5 mg  5 mg Intravenous Q6H PRN Omar Person, NP      . Doug Sou Hold] nitroGLYCERIN (NITROSTAT) SL tablet 0.4 mg  0.4 mg Sublingual Q5 min PRN Reubin Milan, MD      . Doug Sou Hold] ondansetron [facility]) tablet 4 mg  4 mg Oral Q6H PRN Reubin Milan, MD       Or  . Doug Sou Hold] ondansetron [facility]) injection 4 mg  4 mg Intravenous Q6H PRN Reubin Milan, MD      . [facility] Hold] peg 3350 powder (MOVIPREP) kit 100 g  0.5 kit Oral Once Annita Brod, MD      . Doug Sou Hold] potassium chloride SA (K-DUR,KLOR-CON) CR tablet 40 mEq  40 mEq Oral Q12H Regalado, Belkys A, MD   40  mEq at 12/19/16 2203  . [MAR Hold] sodium chloride flush (NS) 0.9 % injection 10-40 mL  10-40 mL Intracatheter Q12H Oswald Hillock, MD   10 mL at 12/18/16 2205  . [MAR Hold] sodium chloride flush (NS) 0.9 % injection 10-40 mL  10-40 mL Intracatheter PRN Darrick Meigs, Marge Duncans, MD       Facility-Administered Medications Ordered in Other Encounters  Medication Dose Route Frequency Provider Last Rate Last Dose  .  0.9 %  sodium chloride infusion    Continuous PRN Izora Gala, CRNA        Allergies as of 12/10/2016 - Review Complete 12/10/2016  Allergen Reaction Noted  . Ampicillin Rash 12/10/2016  . Clindamycin/lincomycin Rash 12/10/2016  . Penicillins Rash 12/10/2016    Family History  Problem Relation Age of Onset  . Hypertension Father     Social History   Social History  . Marital status: Single    Spouse name: N/A  . Number of children: N/A  . Years of education: N/A   Occupational History  . Not on file.   Social History Main Topics  . Smoking status: Never Smoker  . Smokeless tobacco: Never Used  . Alcohol use No  . Drug use: No  . Sexual activity: Not on file   Other Topics Concern  . Not on file   Social History Narrative  . No narrative on file    Review of Systems:  All other review of systems negative except as mentioned in the HPI.  Physical Exam: Vital signs in last 24 hours: Temp:  [97.8 F (36.6 C)-98.4 F (36.9 C)] 97.8 F (36.6 C) (10/11 0716) Pulse Rate:  [70-88] 78 (10/11 0716) Resp:  [18-25] 19 (10/11 0716) BP: (106-123)/(58-94) 121/72 (10/11 0716) SpO2:  [89 %-100 %] 100 % (10/11 0716) Weight:  [315 lb 12.8 oz (143.2 kg)] 315 lb 12.8 oz (143.2 kg) (10/11 0541) Last BM Date: 12/19/16 General:   Alert,  Well-developed, well-nourished, pleasant and cooperative in NAD Lungs:  Clear throughout to auscultation.   Heart:  Regular rate and rhythm; no murmurs, clicks, rubs,  or gallops. Abdomen:  Soft, nontender and nondistended. Normal bowel sounds.    Neuro/Psych:  Alert and cooperative. Normal mood and affect. A and O x 3   _0 .Denzil Magnuson, MD [phone] Mon-Fri 8a-5p [phone] after 5p, weekends, holidays 12/20/2016 8:16 AM@

## 2016-12-20 NOTE — Anesthesia Preprocedure Evaluation (Addendum)
Anesthesia Evaluation  Patient identified by MRN, date of birth, ID band Patient awake    Reviewed: Allergy & Precautions, H&P , NPO status , Patient's Chart, lab work & pertinent test results  Airway Mallampati: II   Neck ROM: full    Dental   Pulmonary sleep apnea ,    breath sounds clear to auscultation       Cardiovascular hypertension, + CAD, + CABG and +CHF  + dysrhythmias Atrial Fibrillation + Cardiac Defibrillator  Rhythm:irregular Rate:Normal  EF 35%   Neuro/Psych PSYCHIATRIC DISORDERS Depression Schizophrenia    GI/Hepatic   Endo/Other  diabetes, Type 2Morbid obesity  Renal/GU Renal InsufficiencyRenal disease     Musculoskeletal   Abdominal   Peds  Hematology  (+) anemia ,   Anesthesia Other Findings   Reproductive/Obstetrics                            Anesthesia Physical Anesthesia Plan  ASA: IV  Anesthesia Plan: MAC   Post-op Pain Management:    Induction: Intravenous  PONV Risk Score and Plan: 1 and Ondansetron and Dexamethasone  Airway Management Planned: Simple Face Mask  Additional Equipment:   Intra-op Plan:   Post-operative Plan:   Informed Consent: I have reviewed the patients History and Physical, chart, labs and discussed the procedure including the risks, benefits and alternatives for the proposed anesthesia with the patient or authorized representative who has indicated his/her understanding and acceptance.     Plan Discussed with: CRNA and Anesthesiologist  Anesthesia Plan Comments:         Anesthesia Quick Evaluation

## 2016-12-20 NOTE — Progress Notes (Signed)
Pt returned from colonoscopy. Suicide sitter at bedside. Pt VSS. CBG 83. Pt breakfast ordered per request. Will continue to monitor.   Fritz Pickerel, RN

## 2016-12-20 NOTE — Progress Notes (Signed)
PROGRESS NOTE    Darrell Black  VVO:[id number] DOB: Aug 06, 1955 DOA: 12/10/2016 PCP: Renata Caprice, DO   Brief Narrative: 61 year old male with past medical history of atrial fibrillation, chronic systolic/diastolic heart failure, diabetes mellitus, morbid obesity and obstructive sleep apnea brought to the emergency room on 9/30 for visual hallucinations 4 days. Initially, patient waiting for transfer to psychiatric facility but over the next 24 hours, developed worsening dyspnea and sedation, possibly from her psychotropic medications. He started to become hypoxic and felt to be in volume overload. Admitted to the hospitalist service at [facility] and placed in stepdown on 10/3.  Patient started on IV diuretics and initially responded well, diuresing over 4 L. Followed by tele-psychiatry.  Following hospitalization, patient started having some mild bleeding from his gums, treated with tranexemic acid and his eliquis was held. He continued to remain hypoxic, requiring BiPAP.  On night of 10/5, patient started having some gross blood per rectum. Remained hemodynamically stable, but hemoglobin dropped from 9.52 days prior to 8.8 by next morning. No hematemesis. PICC line placed 10/6. Patient remained unresponsive on BiPAP. Seen by GI and it was recommended he be transferred to critical care at [facility]. Patient's lack of responsiveness was felt to be secondary to acute psychosis plus hypercapnia plus polypharmacy. By holding sedating medications, patient became more awake by 10/8. Felt to be in acute psychosis and psychiatry consulted. Cardiology also consulted for acute on chronic heart failure.  Transfer back to hospitalist service on 10/9 morning. Cardiology recommended continuing Lasix. Since admission, he is now 14 L diuresed. GI recommended holding eliquis and planning for colonoscopy if unable to get reports previous colonoscopy or rectal bleeding persisted. 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  Improving. Off BIPAP.  Continue with IV lasix. 80 mg IV BID.   Acute on chronic systolic congestive heart failure ([place]):  -Ejection fraction notes EF of 25-30 percent with atrial fibrillation and unable to ascertain diastolic dysfunction. -Cardiology following.  -Continue with IV lasix. 80 mg BID IV  -negtaive 14 L.   Major depression with Psychotic feature.  Clear by psych .  Needs to follow up outpatient for medications management.  No antipsychotic medications at this time.  Hold Requip which could cause psychosis.   GI bleed, recta bleeding.  Underwent colonoscopy, showed hemorrhoids, polyp, S/P polypectomy.  Hb stable, no further bleed.   Cellulitis; lower extremities;  On Ancef. Day 5. Change to doxy for 2 more days due to  Persistent redness.   Acute renal failure/acute kidney injury; Secondary to poor perfusion. Creatinine has been steadily improving. Peaked his high as 2.2. Down to 1.36, improving with diuretics   Afib; holding anticoagulation due to GI bleed.  Plan to resume anticoagulation tomorrow.  Restless leg syndrome: Per Psych  Requip could cause psychosis, plan to hold that medication for now.   Diabetes  mellitus without complication ([place]):? Maybe erroneous diagnosis. A1c at 5.4 and patient not on home medications.  Morbid obesity due to excess calories [facility]) patient meets criteria with BMI greater than 40.  Hypernatremia; monitor on lasix. Resolved.   Hypokalemia; oral potassium supplements.   DVT prophylaxis: SCD. No anticoagulation due to GI bleed  Code Status: full code.  Family Communication: care discussed with patient.  Disposition Plan: home when stable.    Consultants:   GI  Cardiology    Procedures:  ECHO; Procedure narrative: Contrast enhancement employed. - Left ventricle: The cavity size was normal. Wall thickness was   increased in a pattern of mild LVH. Systolic function was   moderately reduced. The estimated ejection fraction was in the   range of 35% to 40%. Diffuse hypokinesis. The study was not   technically sufficient to allow evaluation of LV diastolic   dysfunction due to atrial fibrillation. - Regional wall motion abnormality: Akinesis of the apical septal   and apical myocardium; moderate hypokinesis of the basal-mid    anteroseptal myocardium.     Antimicrobials:   Ancef   Subjective: He is feeling better, still with some SOB on exertion.  He is planning on moving to [place] to live with niece.    Objective: Vitals:   12/20/16 0907 12/20/16 0910 12/20/16 0928 12/20/16 1315  BP:  112/74 133/83 116/75  Pulse: 78 76  74  Resp: _0 (!) 24  Temp:   97.7 F (36.5 C) 98.4 F (36.9 C)  TempSrc:   Oral Oral  SpO2: 100% 100%  100%  Weight:      Height:        Intake/Output Summary (Last 24 hours) at 12/20/16 1337 Last data filed at 12/20/16 1127  Gross per 24 hour  Intake              860 ml  Output             1325 ml  Net             -465 ml   Filed Weights   12/17/16 1300 12/19/16 0358 12/20/16 0541  Weight: (!) 140.2 kg (309 lb) (!) 143.5 kg (316 lb 6.4 oz) (!) 143.2 kg (315 lb 12.8 oz)    Examination:  General  exam: NAD Respiratory system: CTA Cardiovascular system: S 1, S 2 RRR Gastrointestinal system: abdomen soft, nt Central nervous system: non focal.  Extremities: bilateral edema and redness.  Skin: lower extremities with edema, redness.     Data Reviewed: I have personally reviewed following labs and imaging studies  CBC:  Recent Labs Lab 12/15/16 2039 12/16/16 0248 12/16/16 1425 12/17/16 0430 12/17/16 0848 12/19/16 0434  WBC 5.5 6.1 5.6 6.5 6.6 6.5  NEUTROABS 4.4 4.9 4.3 5.1 5.0  --   HGB 8.7* 8.9* 8.9* 10.0* 10.1* 10.4*  HCT 30.9* 31.0* 31.9* 35.3* 35.3* 35.8*  MCV 101.0* 100.3* 100.6* 99.2 99.4 96.2  PLT 180 170 184 191 188 301   Basic Metabolic Panel:  Recent Labs Lab 12/15/16 0449 12/15/16 2039 12/16/16 0248 12/17/16 0430 12/19/16 0434  NA 149* 146* 148* 148* 138  K 4.2 3.7 3.6 3.5 3.4*  CL 91* 91* 92* 90* 84*  CO2 44* 47* 47* 48* 43*  GLUCOSE 92 101* 86 97 107*  BUN 51* 43* 38* 26* 19  CREATININE 2.20* 1.79*  1.61* 1.36* 1.28*  CALCIUM 8.7* 8.4* 8.6* 8.7* 8.3*  MG  --   --  1.7 1.6*  --   PHOS  --   --  2.4* 2.7  --    GFR: Estimated Creatinine Clearance: 84.3 mL/min (A) (by C-G formula based on SCr of 1.28 mg/dL (H)). Liver Function Tests:  Recent Labs Lab 12/15/16 0449 12/15/16 2039 12/19/16 0434  AST 157* 112* 48*  ALT 91* 84* 27  ALKPHOS 176* 166* 141*  BILITOT 1.7* 1.4* 1.4*  PROT 6.4* 6.2* 6.6  ALBUMIN 3.1* 2.9* 3.0*   No results for input(s): LIPASE, AMYLASE in the last 168 hours.  Recent Labs Lab 12/15/16 1313 12/15/16 2039 12/17/16 0430  AMMONIA _0 Coagulation Profile:  Recent Labs Lab 12/14/16 1944 12/15/16 2039  INR 2.46 1.90   Cardiac Enzymes: No results for input(s): CKTOTAL, CKMB, CKMBINDEX, TROPONINI in the last 168 hours. BNP (last 3 results) No results for input(s): PROBNP in the last 8760 hours. HbA1C: No results for input(s): HGBA1C in the last 72 hours. CBG:  Recent Labs Lab 12/19/16 2003  12/20/16 0010 12/20/16 0417 12/20/16 0722 12/20/16 0953  GLUCAP 101* 113* 104* 88 83   Lipid Profile: No results for input(s): CHOL, HDL, LDLCALC, TRIG, CHOLHDL, LDLDIRECT in the last 72 hours. Thyroid Function Tests: No results for input(s): TSH, T4TOTAL, FREET4, T3FREE, THYROIDAB in the last 72 hours. Anemia Panel: No results for input(s): VITAMINB12, FOLATE, FERRITIN, TIBC, IRON, RETICCTPCT in the last 72 hours. Sepsis Labs:  Recent Labs Lab 12/16/16 1550  LATICACIDVEN 1.0    Recent Results (from the past 240 hour(s))  MRSA PCR Screening     Status: None   Collection Time: 12/12/16  6:47 AM  Result Value Ref Range Status   MRSA by PCR NEGATIVE NEGATIVE Final    Comment:        The GeneXpert MRSA Assay (FDA approved for NASAL specimens only), is one component of a comprehensive MRSA colonization surveillance program. It is not intended to diagnose MRSA infection nor to guide or monitor treatment for MRSA infections.          Radiology Studies: No results found.      Scheduled Meds: . amiodarone  200 mg Oral Daily  . atorvastatin  80 mg Oral q1800  . carvedilol  3.125 mg Oral BID WC  . Chlorhexidine Gluconate Cloth  6 each Topical Daily  . furosemide  80 mg Intravenous Q12H  . insulin aspart  2-6 Units Subcutaneous TID WC  . metoCLOPramide (REGLAN) injection  10 mg Intravenous Once  . peg 3350 powder  0.5 kit Oral Once  . potassium chloride  40 mEq Oral Q12H  . sodium chloride flush  10-40 mL Intracatheter Q12H   Continuous Infusions: .  ceFAZolin (ANCEF) IV 1 g (12/20/16 0520)     LOS: 8 days    Time spent: 35 minutes.     Elmarie Shiley, MD [facility] Pager [phone]  If 7PM-7AM, please contact night-coverage [email or web address] Password [email or web address] 12/20/2016, 1:37 PM

## 2016-12-20 NOTE — Progress Notes (Signed)
Progress Note  Patient Name: Darrell Black Date of Encounter: 12/20/2016  Primary Cardiologist: Vernon Prey Pasi ( ? [facility]) , Prudy Feeler, ( [place])   Subjective   61 year old gentleman with a history of atrial fibrillation and acute on chronic systolic and diastolic congestive heart failure. He's had recent GI bleeding. Colonoscopy today revealed a 12 mm polyp which was removed. He was also found to have hemorrhoids that were not bleeding. The gastroenterology team has given the okay to restart Eliquis t tomorrow.  Inpatient Medications    Scheduled Meds: . amiodarone  200 mg Oral Daily  . atorvastatin  80 mg Oral q1800  . carvedilol  3.125 mg Oral BID WC  . Chlorhexidine Gluconate Cloth  6 each Topical Daily  . furosemide  80 mg Intravenous Q12H  . insulin aspart  2-6 Units Subcutaneous Q4H  . metoCLOPramide (REGLAN) injection  10 mg Intravenous Once  . peg 3350 powder  0.5 kit Oral Once  . potassium chloride  40 mEq Oral Q12H  . sodium chloride flush  10-40 mL Intracatheter Q12H   Continuous Infusions: .  ceFAZolin (ANCEF) IV 1 g (12/20/16 0520)   PRN Meds: acetaminophen, albuterol, metoprolol tartrate, nitroGLYCERIN, ondansetron **OR** ondansetron (ZOFRAN) IV, sodium chloride flush   Vital Signs    Vitals:   12/20/16 0902 12/20/16 0907 12/20/16 0910 12/20/16 0928  BP:   112/74 133/83  Pulse:  78 76   Resp:  _0 Temp: 97.9 F (36.6 C)   97.7 F (36.5 C)  TempSrc:    Oral  SpO2:  100% 100%   Weight:      Height:        Intake/Output Summary (Last 24 hours) at 12/20/16 1216 Last data filed at 12/20/16 1127  Gross per 24 hour  Intake             1460 ml  Output             1975 ml  Net             -515 ml   Filed Weights   12/17/16 1300 12/19/16 0358 12/20/16 0541  Weight: (!) 309 lb (140.2 kg) (!) 316 lb 6.4 oz (143.5 kg) (!) 315 lb 12.8 oz (143.2 kg)    Telemetry    Atrial fib ( lots of artifact ) - Personally Reviewed  ECG    Atrial fib  -  Personally Reviewed  Physical Exam   GEN: No acute distress.   Neck: No JVD Cardiac: Irreg. Irreg. , no murmurs, rubs, or gallops.  Respiratory: Clear to auscultation bilaterally. GI: Soft, nontender, non-distended  MS: No edema; No deformity. Neuro:  Nonfocal  Psych: Normal affect   Labs    Chemistry Recent Labs Lab 12/15/16 0449 12/15/16 2039 12/16/16 0248 12/17/16 0430 12/19/16 0434  NA 149* 146* 148* 148* 138  K 4.2 3.7 3.6 3.5 3.4*  CL 91* 91* 92* 90* 84*  CO2 44* 47* 47* 48* 43*  GLUCOSE 92 101* 86 97 107*  BUN 51* 43* 38* 26* 19  CREATININE 2.20* 1.79* 1.61* 1.36* 1.28*  CALCIUM 8.7* 8.4* 8.6* 8.7* 8.3*  PROT 6.4* 6.2*  --   --  6.6  ALBUMIN 3.1* 2.9*  --   --  3.0*  AST 157* 112*  --   --  48*  ALT 91* 84*  --   --  27  ALKPHOS 176* 166*  --   --  141*  BILITOT 1.7* 1.4*  --   --  1.4*  GFRNONAA 31* 39* 45* 55* 59*  GFRAA 35* 45* 52* >60 >60  ANIONGAP _0 Hematology Recent Labs Lab 12/17/16 0430 12/17/16 0848 12/19/16 0434  WBC 6.5 6.6 6.5  RBC 3.56* 3.55* 3.72*  HGB 10.0* 10.1* 10.4*  HCT 35.3* 35.3* 35.8*  MCV 99.2 99.4 96.2  MCH 28.1 28.5 28.0  MCHC 28.3* 28.6* 29.1*  RDW 16.4* 16.3* 15.5  PLT 191 188 234    Cardiac EnzymesNo results for input(s): TROPONINI in the last 168 hours. No results for input(s): TROPIPOC in the last 168 hours.   BNPNo results for input(s): BNP, PROBNP in the last 168 hours.   DDimer No results for input(s): DDIMER in the last 168 hours.   Radiology    No results found.  Cardiac Studies    Patient Profile     61 y.o. male with A-fib, CHF   Assessment & Plan    1. Acute on chronic systolic congestive heart failure. The patient was diuresed 14.7 L so far. Continue Lasix 80 mg IV every 12 hours. Continue carvedilol. He follow-up with [facility] cardi[facility] at [facility]  .  2. Persistent atrial fibrillation: Restart Eliquis  Tomorrow.  heart rate remains fairly  well-controlled.   For questions or updates, please contact [place] Please consult [email or web address] for contact info under Cardiology/STEMI.      Signed, Mertie Moores, MD  12/20/2016, 12:16 PM

## 2016-12-21 ENCOUNTER — Encounter (HOSPITAL_COMMUNITY): Payer: Self-pay | Admitting: Gastroenterology

## 2016-12-21 LAB — GLUCOSE, CAPILLARY
GLUCOSE-CAPILLARY: 120 mg/dL — AB (ref 65–99)
Glucose-Capillary: 158 mg/dL — ABNORMAL HIGH (ref 65–99)
Glucose-Capillary: 94 mg/dL (ref 65–99)
Glucose-Capillary: 126 mg/dL — ABNORMAL HIGH (ref 65–99)

## 2016-12-21 LAB — CBC
HEMATOCRIT: 33.3 % — AB (ref 39.0–52.0)
HEMOGLOBIN: 10 g/dL — AB (ref 13.0–17.0)
MCH: 29.1 pg (ref 26.0–34.0)
MCHC: 30 g/dL (ref 30.0–36.0)
MCV: 96.8 fL (ref 78.0–100.0)
Platelets: 194 10*3/uL (ref 150–400)
RBC: 3.44 MIL/uL — ABNORMAL LOW (ref 4.22–5.81)
RDW: 15.7 % — ABNORMAL HIGH (ref 11.5–15.5)
WBC: 4.7 10*3/uL (ref 4.0–10.5)

## 2016-12-21 LAB — BASIC METABOLIC PANEL
Anion gap: 8 (ref 5–15)
BUN: 17 mg/dL (ref 6–20)
CHLORIDE: 89 mmol/L — AB (ref 101–111)
CO2: 42 mmol/L — AB (ref 22–32)
CREATININE: 1.25 mg/dL — AB (ref 0.61–1.24)
Calcium: 8.4 mg/dL — ABNORMAL LOW (ref 8.9–10.3)
GFR calc Af Amer: >60 mL/min (ref >60)
GFR calc non Af Amer: >60 mL/min (ref >60)
Glucose, Bld: 113 mg/dL — ABNORMAL HIGH (ref 65–99)
Potassium: 3.8 mmol/L (ref 3.5–5.1)
Sodium: 139 mmol/L (ref 135–145)

## 2016-12-21 LAB — TSH: TSH: 11.211 u[IU]/mL — ABNORMAL HIGH (ref 0.350–4.500)

## 2016-12-21 MED ORDER — SULFAMETHOXAZOLE-TRIMETHOPRIM 800-160 MG PO TABS
1.0000 | ORAL_TABLET | Freq: Two times a day (BID) | ORAL | Status: DC
  Administered 2016-12-21 – 2016-12-24 (×6): 1 via ORAL
  Filled 2016-12-21 (×6): qty 1

## 2016-12-21 MED ORDER — APIXABAN 5 MG PO TABS
5.0000 mg | ORAL_TABLET | Freq: Two times a day (BID) | ORAL | Status: DC
  Administered 2016-12-21 – 2016-12-22 (×4): 5 mg via ORAL
  Filled 2016-12-21 (×4): qty 1

## 2016-12-21 MED ORDER — INSULIN ASPART 100 UNIT/ML ~~LOC~~ SOLN
0.0000 [IU] | Freq: Three times a day (TID) | SUBCUTANEOUS | Status: DC
  Administered 2016-12-27: 2 [IU] via SUBCUTANEOUS
  Administered 2016-12-28: 3 [IU] via SUBCUTANEOUS
  Administered 2016-12-28: 2 [IU] via SUBCUTANEOUS
  Administered 2016-12-29 (×2): 3 [IU] via SUBCUTANEOUS
  Administered 2016-12-30 (×2): 2 [IU] via SUBCUTANEOUS

## 2016-12-21 MED ORDER — SODIUM CHLORIDE 0.9% FLUSH: 10.0000 mL | INTRAVENOUS | Status: DC | PRN

## 2016-12-21 MED ORDER — SODIUM CHLORIDE 0.9% FLUSH: 10.0000 mL | Freq: Two times a day (BID) | INTRAVENOUS | Status: DC

## 2016-12-21 NOTE — Progress Notes (Signed)
SATURATION QUALIFICATIONS: (This note is used to comply with regulatory documentation for home oxygen)  Patient Saturations on Room Air at Rest = 89%  Patient Saturations on Room Air while Ambulating = 84%  Patient Saturations on 2 Liters of oxygen while Ambulating = 93%  Please briefly explain why patient needs home oxygen:  Pt requiring 2 L O2 to maintain SpO2 greater than 90% during functional activities.

## 2016-12-21 NOTE — Progress Notes (Signed)
PROGRESS NOTE    Darrell Black  AQT:[id number] DOB: 1955-11-24 DOA: 12/10/2016 PCP: Renata Caprice, DO   Brief Narrative: 61 year old male with past medical history of atrial fibrillation, chronic systolic/diastolic heart failure, diabetes mellitus, morbid obesity and obstructive sleep apnea brought to the emergency room on 9/30 for visual hallucinations 4 days. Initially, patient waiting for transfer to psychiatric facility but over the next 24 hours, developed worsening dyspnea and sedation, possibly from her psychotropic medications. He started to become hypoxic and felt to be in volume overload. Admitted to the hospitalist service at [facility] and placed in stepdown on 10/3.  Patient started on IV diuretics and initially responded well, diuresing over 4 L. Followed by tele-psychiatry.  Following hospitalization, patient started having some mild bleeding from his gums, treated with tranexemic acid and his eliquis was held. He continued to remain hypoxic, requiring BiPAP.  On night of 10/5, patient started having some gross blood per rectum. Remained hemodynamically stable, but hemoglobin dropped from 9.52 days prior to 8.8 by next morning. No hematemesis. PICC line placed 10/6. Patient remained unresponsive on BiPAP. Seen by GI and it was recommended he be transferred to critical care at [facility]. Patient's lack of responsiveness was felt to be secondary to acute psychosis plus hypercapnia plus polypharmacy. By holding sedating medications, patient became more awake by 10/8. Felt to be in acute psychosis and psychiatry consulted. Cardiology also consulted for acute on chronic heart failure.  Transfer back to hospitalist service on 10/9 morning. Cardiology recommended continuing Lasix. Since admission, he is now 14 L diuresed. GI recommended holding eliquis and planning for colonoscopy if unable to get reports previous colonoscopy or rectal bleeding persisted. 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  Improving. Off BIPAP.  Continue with IV lasix. 80 mg IV BID.  Now on 2 L.  Check oxygen on ambulation.   Acute on chronic systolic congestive heart failure ([place]):  -Ejection fraction notes EF of 25-30 percent with atrial fibrillation and unable to ascertain diastolic dysfunction. -Cardiology following.  -Continue with IV lasix. 80 mg BID IV  -negtaive 15 L.   Major depression with Psychotic feature.  Clear by psych .  Needs to follow up outpatient for medications management.  No antipsychotic medications at this time.  Hold Requip which could cause psychosis.   GI bleed, recta bleeding.  Underwent colonoscopy, showed hemorrhoids, polyp, S/P polypectomy.  Hb stable.   Cellulitis; lower extremities;  On Ancef. Day 5. Change to doxy for 2 more days due to  Persistent redness.   Acute renal failure/acute kidney injury; Secondary to poor perfusion. Creatinine has been steadily improving. Peaked his high as 2.2. Down to 1.36, improving with diuretics   Afib; holding anticoagulation due to GI  bleed.  Plan to resume anticoagulation today, per cardiology  Restless leg syndrome: Per Psych  Requip could cause psychosis, plan to hold that  medication for now.  Start Gabapentin low dose.   Diabetes mellitus without complication ([place]):? Maybe erroneous diagnosis. A1c at 5.4 and patient not on home medications.  Morbid obesity due to excess calories [facility]) patient meets criteria with BMI greater than 40.  Hypernatremia; monitor on lasix. Resolved.   Hypokalemia; oral potassium supplements.   DVT prophylaxis: SCD. No anticoagulation due to GI bleed  Code Status: full code.  Family Communication: care discussed with patient.  Disposition Plan: home when stable.    Consultants:   GI  Cardiology    Procedures:  ECHO; Procedure narrative: Contrast enhancement employed. - Left ventricle: The cavity size was normal. Wall thickness was   increased in a pattern of mild LVH. Systolic function was   moderately reduced. The estimated ejection fraction was in the   range of 35% to 40%. Diffuse hypokinesis. The study was not   technically sufficient to allow evaluation of LV diastolic   dysfunction due to atrial fibrillation. - Regional wall motion abnormality: Akinesis of the apical septal   and apical myocardium; moderate hypokinesis of the basal-mid    anteroseptal myocardium.     Antimicrobials:   Ancef   Subjective: He is sleeppy. He is breathing well.     Objective: Vitals:   12/20/16 2052 12/21/16 0059 12/21/16 0438 12/21/16 0440  BP: 102/70 105/71  125/66  Pulse:      Resp: (!) 23 20  18   Temp: 98.5 F (36.9 C) 98.1 F (36.7 C) 98.4 F (36.9 C)   TempSrc: Oral Oral Oral   SpO2: 99% 93%  99%  Weight:      Height:        Intake/Output Summary (Last 24 hours) at 12/21/16 0750 Last data filed at 12/21/16 0433  Gross per 24 hour  Intake              220 ml  Output              900 ml  Net             -680 ml   Filed Weights   12/17/16 1300 12/19/16 0358 12/20/16 0541  Weight: (!) 140.2 kg (309 lb) (!) 143.5 kg (316 lb 6.4 oz) (!) 143.2 kg (315 lb 12.8 oz)    Examination:  General exam:  NAD Respiratory system: CTA Cardiovascular system: S 1, S 2 RRR Gastrointestinal system: BS present, soft, nt Central nervous system: non focal.  Extremities: bilateral edema, redness.      Data Reviewed: I have personally reviewed following labs and imaging studies  CBC:  Recent Labs Lab 12/15/16 2039 12/16/16 0248 12/16/16 1425 12/17/16 0430 12/17/16 0848 12/19/16 0434 12/21/16 0428  WBC 5.5 6.1 5.6 6.5 6.6 6.5 4.7  NEUTROABS 4.4 4.9 4.3 5.1 5.0  --   --   HGB 8.7* 8.9* 8.9* 10.0* 10.1* 10.4* 10.0*  HCT 30.9* 31.0* 31.9* 35.3* 35.3* 35.8* 33.3*  MCV 101.0* 100.3* 100.6* 99.2 99.4 96.2 96.8  PLT 180 170 184 191 188 234 852   Basic Metabolic Panel:  Recent Labs Lab 12/15/16 2039 12/16/16 0248 12/17/16 0430 12/19/16 0434 12/21/16 0428  NA 146* 148* 148* 138 139  K 3.7 3.6 3.5 3.4* 3.8  CL 91* 92* 90* 84* 89*  CO2 47* 47* 48* 43* 42*  GLUCOSE 101* 86 97 107* 113*  BUN  43* 38* 26* 19 17  CREATININE 1.79* 1.61* 1.36* 1.28* 1.25*  CALCIUM 8.4* 8.6* 8.7* 8.3* 8.4*  MG  --  1.7 1.6*  --   --   PHOS  --  2.4* 2.7  --   --    GFR: Estimated Creatinine Clearance: 86.3 mL/min (A) (by C-G formula based on SCr of 1.25 mg/dL (H)). Liver Function Tests:  Recent Labs Lab 12/15/16 0449 12/15/16 2039 12/19/16 0434  AST 157* 112* 48*  ALT 91* 84* 27  ALKPHOS 176* 166* 141*  BILITOT 1.7* 1.4* 1.4*  PROT 6.4* 6.2* 6.6  ALBUMIN 3.1* 2.9* 3.0*   No results for input(s): LIPASE, AMYLASE in the last 168 hours.  Recent Labs Lab 12/15/16 1313 12/15/16 2039 12/17/16 0430  AMMONIA 21 24 20    Coagulation Profile:  Recent Labs Lab 12/14/16 1944 12/15/16 2039  INR 2.46 1.90   Cardiac Enzymes: No results for input(s): CKTOTAL, CKMB, CKMBINDEX, TROPONINI in the last 168 hours. BNP (last 3 results) No results for input(s): PROBNP in the last 8760 hours. HbA1C: No results for input(s): HGBA1C in the last 72 hours. CBG:  Recent Labs Lab 12/20/16 0417  12/20/16 0722 12/20/16 0953 12/20/16 2050 12/21/16 0615  GLUCAP 104* 88 83 402* 158*   Lipid Profile: No results for input(s): CHOL, HDL, LDLCALC, TRIG, CHOLHDL, LDLDIRECT in the last 72 hours. Thyroid Function Tests: No results for input(s): TSH, T4TOTAL, FREET4, T3FREE, THYROIDAB in the last 72 hours. Anemia Panel: No results for input(s): VITAMINB12, FOLATE, FERRITIN, TIBC, IRON, RETICCTPCT in the last 72 hours. Sepsis Labs:  Recent Labs Lab 12/16/16 1550  LATICACIDVEN 1.0    Recent Results (from the past 240 hour(s))  MRSA PCR Screening     Status: None   Collection Time: 12/12/16  6:47 AM  Result Value Ref Range Status   MRSA by PCR NEGATIVE NEGATIVE Final    Comment:        The GeneXpert MRSA Assay (FDA approved for NASAL specimens only), is one component of a comprehensive MRSA colonization surveillance program. It is not intended to diagnose MRSA infection nor to guide or monitor treatment for MRSA infections.          Radiology Studies: No results found.      Scheduled Meds: . amiodarone  200 mg Oral Daily  . atorvastatin  80 mg Oral q1800  . carvedilol  3.125 mg Oral BID WC  . doxycycline  100 mg Oral Q12H  . furosemide  80 mg Intravenous Q12H  . gabapentin  200 mg Oral QHS  . insulin aspart  2-6 Units Subcutaneous TID WC  . metoCLOPramide (REGLAN) injection  10 mg Intravenous Once  . peg 3350 powder  0.5 kit Oral Once  . potassium chloride  40 mEq Oral Q12H  . sodium chloride flush  10-40 mL Intracatheter Q12H   Continuous Infusions:    LOS: 9 days    Time spent: 35 minutes.     Elmarie Shiley, MD [facility] Pager [phone]  If 7PM-7AM, please contact night-coverage [email or web address] Password [email or web address] 12/21/2016, 7:50 AM

## 2016-12-21 NOTE — Progress Notes (Addendum)
Dressings to lower extremities changed. RLE and LLE 1+ pitting edema. Redness r/t cellulitis. L 2nd toe has dry scab. Dry scab to right anterior medial shin. Both left open to air. Two foam dressings placed to LLE. Both open sites with scant serous drainage.   Spoke with patient's niece Aldona Bar in [place]. Updated her as to plan of care. Updated Lorriane Shire SW to please call Aldona Bar to discuss discharge disposition. Pt was wearing 2L-4L O2 prior to admission.   Fritz Pickerel, RN

## 2016-12-21 NOTE — Anesthesia Postprocedure Evaluation (Signed)
Anesthesia Post Note  Patient: Darrell Black  Procedure(s) Performed: COLONOSCOPY (N/A )     Patient location during evaluation: PACU Anesthesia Type: MAC Level of consciousness: awake and alert Pain management: pain level controlled Vital Signs Assessment: post-procedure vital signs reviewed and stable Respiratory status: spontaneous breathing, nonlabored ventilation, respiratory function stable and patient connected to nasal cannula oxygen Cardiovascular status: stable and blood pressure returned to baseline Postop Assessment: no apparent nausea or vomiting Anesthetic complications: no    Last Vitals:  Vitals:   12/21/16 1149 12/21/16 1648  BP: (!) 93/49 110/71  Pulse: 70 72  Resp: 15   Temp: (!) 36.3 C   SpO2: 100%     Last Pain:  Vitals:   12/21/16 1149  TempSrc: Oral  PainSc:                  [place]

## 2016-12-21 NOTE — Progress Notes (Signed)
 Progress Note  Patient Name: Darrell Black Date of Encounter: 12/21/2016  Primary Cardiologist: Deepak Pasi at [facility]  [place] here  Subjective   No chest pain and no SOB, edema improving.   Inpatient Medications    Scheduled Meds: . amiodarone  200 mg Oral Daily  . atorvastatin  80 mg Oral q1800  . carvedilol  3.125 mg Oral BID WC  . furosemide  80 mg Intravenous Q12H  . gabapentin  200 mg Oral QHS  . insulin aspart  2-6 Units Subcutaneous TID WC  . metoCLOPramide (REGLAN) injection  10 mg Intravenous Once  . peg 3350 powder  0.5 kit Oral Once  . potassium chloride  40 mEq Oral Q12H  . sodium chloride flush  10-40 mL Intracatheter Q12H  . sulfamethoxazole-trimethoprim  1 tablet Oral Q12H   Continuous Infusions:  PRN Meds: acetaminophen, albuterol, metoprolol tartrate, nitroGLYCERIN, ondansetron **OR** ondansetron (ZOFRAN) IV, sodium chloride flush   Vital Signs    Vitals:   12/21/16 0438 12/21/16 0440 12/21/16 0748 12/21/16 0800  BP:  125/66 99/70 111/66  Pulse:   73 74  Resp:  18 17   Temp: 98.4 F (36.9 C)  98 F (36.7 C)   TempSrc: Oral  Oral   SpO2:  99% 98%   Weight:      Height:        Intake/Output Summary (Last 24 hours) at 12/21/16 1036 Last data filed at 12/21/16 0433  Gross per 24 hour  Intake               20 ml  Output              900 ml  Net             -880 ml   Filed Weights   12/17/16 1300 12/19/16 0358 12/20/16 0541  Weight: (!) 309 lb (140.2 kg) (!) 316 lb 6.4 oz (143.5 kg) (!) 315 lb 12.8 oz (143.2 kg)    Telemetry    A fib mostly rate controlled - Personally Reviewed  ECG    No new - Personally Reviewed  Physical Exam   GEN: No acute distress.   Neck: No JVD Cardiac: irreg irreg, no murmurs, rubs, or gallops.  Respiratory: Clear to auscultation bilaterally. GI: Soft, nontender, non-distended  MS: 2+ edema; but improving No deformity. Neuro:  Nonfocal  Psych: Normal affect   Labs    Chemistry Recent Labs Lab  12/15/16 0449 12/15/16 2039  12/17/16 0430 12/19/16 0434 12/21/16 0428  NA 149* 146*  < > 148* 138 139  K 4.2 3.7  < > 3.5 3.4* 3.8  CL 91* 91*  < > 90* 84* 89*  CO2 44* 47*  < > 48* 43* 42*  GLUCOSE 92 101*  < > 97 107* 113*  BUN 51* 43*  < > 26* 19 17  CREATININE 2.20* 1.79*  < > 1.36* 1.28* 1.25*  CALCIUM 8.7* 8.4*  < > 8.7* 8.3* 8.4*  PROT 6.4* 6.2*  --   --  6.6  --   ALBUMIN 3.1* 2.9*  --   --  3.0*  --   AST 157* 112*  --   --  48*  --   ALT 91* 84*  --   --  27  --   ALKPHOS 176* 166*  --   --  141*  --   BILITOT 1.7* 1.4*  --   --  1.4*  --   GFRNONAA 31* 39*  < >   55* 59* >60  GFRAA 35* 45*  < > >60 >60 >60  ANIONGAP 14 8  < > 10 11 8  < > = values in this interval not displayed.   Hematology Recent Labs Lab 12/17/16 0848 12/19/16 0434 12/21/16 0428  WBC 6.6 6.5 4.7  RBC 3.55* 3.72* 3.44*  HGB 10.1* 10.4* 10.0*  HCT 35.3* 35.8* 33.3*  MCV 99.4 96.2 96.8  MCH 28.5 28.0 29.1  MCHC 28.6* 29.1* 30.0  RDW 16.3* 15.5 15.7*  PLT 188 234 194    Cardiac EnzymesNo results for input(s): TROPONINI in the last 168 hours. No results for input(s): TROPIPOC in the last 168 hours.   BNPNo results for input(s): BNP, PROBNP in the last 168 hours.   DDimer No results for input(s): DDIMER in the last 168 hours.   Radiology    No results found.  Cardiac Studies   12/12/16 ECHO Study Conclusions  - Procedure narrative: Contrast enhancement employed. - Left ventricle: The cavity size was normal. Wall thickness was   increased in a pattern of mild LVH. Systolic function was   moderately reduced. The estimated ejection fraction was in the   range of 35% to 40%. Diffuse hypokinesis. The study was not   technically sufficient to allow evaluation of LV diastolic   dysfunction due to atrial fibrillation. - Regional wall motion abnormality: Akinesis of the apical septal   and apical myocardium; moderate hypokinesis of the basal-mid   anteroseptal myocardium. - Aortic  valve: Mildly calcified leaflets. - Mitral valve: Moderately calcified annulus. Normal thickness   leaflets . - Left atrium: The atrium was mildly dilated. - Right ventricle: Pacer wire or catheter noted in right ventricle.   Systolic function was mildly reduced. - Tricuspid valve: There was mild regurgitation. - Pulmonary arteries: PA peak pressure: 38 mm Hg (S). - Systemic veins: IVC is dilated wtih normal respiratory variation.   Estimated CVP 8 mmHg.   Patient Profile     61 y.o. male with a history of atrial fibrillation and acute on chronic systolic and diastolic congestive heart failure. He's had recent GI bleeding. Colonoscopy 12/20/16 revealed a 12 mm polyp which was removed. He was also found to have hemorrhoids that were not bleeding. The gastroenterology team has given the okay to restart Eliquis today.  Assessment & Plan    1. Acute on chronic systolic congestive heart failure. The patient was diuresed neg 16  L so far. Continue Lasix 80 mg IV every 12 hours. Continue carvedilol. With desat continue lasix for another day He follow-up with his primary cardiologist at [facility]  .  Cr 1.25 today down from 1.28 and 1.36   2. Cardiomyopathy EF 35-40%  3. Persistent atrial fibrillation: Restart Eliquis  today  heart rate remains fairly well-controlled  4. Acute psychosis plus hypercapnia plus polypharmacy  Per IM  5.  Hypoxia SP02 to 84% room air with ambulation will need home O2  For questions or updates, please contact [facility] Please consult [email or web address] for contact info under Cardiology/STEMI.      Signed, Laura Ingold, NP  12/21/2016, 10:36 AM     Attending Note:   The patient was seen and examined.  Agree with assessment and plan as noted above.  Changes made to the above note as needed.  Patient seen and independently examined with Laura Ingold, NP .   We discussed all aspects of the encounter. I agree with the assessment and plan as stated  above.  1. Acute   on Chronic combined S/D CHF Continues to diurese. Has persistent atrial fib that is likely worsening his CHF HR is much better  I/O = -16 liters so far this admission Leg edema is much better Continue IV lasix Consider changing to PO tomorrow    2.  Atrial fib :   Chronic AF  He has restarted Eliquis . No further signs of bleedin g Continue coreg for rate control  amio200 mg po QD     I have spent a total of 40 minutes with patient reviewing hospital  notes , telemetry, EKGs, labs and examining patient as well as establishing an assessment and plan that was discussed with the patient. > 50% of time was spent in direct patient care.     J. , Jr., MD, FACC 12/21/2016, 5:42 PM [address],  [address] Office - [phone] Pager [phone]    
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
## 2016-12-21 NOTE — Progress Notes (Signed)
ANTICOAGULATION CONSULT NOTE - Initial Consult  Pharmacy Consult for Eliqusi Indication: atrial fibrillation  Allergies  Allergen Reactions  . Ampicillin Rash  . Clindamycin/Lincomycin Rash  . Penicillins Rash    Has patient had a PCN reaction causing immediate rash, facial/tongue/throat swelling, SOB or lightheadedness with hypotension: Yes Has patient had a PCN reaction causing severe rash involving mucus membranes or skin necrosis: No Has patient had a PCN reaction that required hospitalization: No Has patient had a PCN reaction occurring within the last 10 years: No If all of the above answers are "NO", then may proceed with Cephalosporin use.     Patient Measurements: Height: 5\' 8"  (172.7 cm) Weight: (!) 315 lb 12.8 oz (143.2 kg) IBW/kg (Calculated) : 68.4   Vital Signs: Temp: 98 F (36.7 C) (10/12 0748) Temp Source: Oral (10/12 0748) BP: 111/66 (10/12 0800) Pulse Rate: 74 (10/12 0800)  Labs:  Recent Labs  12/19/16 0434 12/21/16 0428  HGB 10.4* 10.0*  HCT 35.8* 33.3*  PLT 234 194  CREATININE 1.28* 1.25*    Estimated Creatinine Clearance: 86.3 mL/min (A) (by C-G formula based on SCr of 1.25 mg/dL (H)).  Assessment: 40 YOM on Eliquis for AFib PTA, held here d/t GI bleed (BRBPR).  GI has worked up- colonoscopy showed polyp in sigmoid colon and non bleeding hemorrhoids. Cleared to restart anticoagulation.  Age: 61, wt: 143kg, SCr: 1.2- does not meet any parameters to reduce dose.  Goal of Therapy:    Monitor platelets by anticoagulation protocol: Yes   Plan:  Eliquis 5mg  PO BID Pharmacy to sign off and follow peripherally Follow for s/s bleeding  Leman Martinek D. Leily Capek, PharmD, [place] Clinical Pharmacist Pager: [phone] Clinical Phone for 12/21/2016 until 3:30pm: [phone] If after 3:30pm, please call main pharmacy at [phone] 12/21/2016 11:33 AM

## 2016-12-22 ENCOUNTER — Inpatient Hospital Stay (HOSPITAL_COMMUNITY): Payer: Medicaid Other

## 2016-12-22 LAB — CBC
HEMATOCRIT: 34.5 % — AB (ref 39.0–52.0)
Hemoglobin: 10.2 g/dL — ABNORMAL LOW (ref 13.0–17.0)
MCH: 28.3 pg (ref 26.0–34.0)
MCHC: 29.6 g/dL — AB (ref 30.0–36.0)
MCV: 95.6 fL (ref 78.0–100.0)
Platelets: 236 10*3/uL (ref 150–400)
RBC: 3.61 MIL/uL — ABNORMAL LOW (ref 4.22–5.81)
RDW: 15.5 % (ref 11.5–15.5)
WBC: 7.3 10*3/uL (ref 4.0–10.5)

## 2016-12-22 LAB — BASIC METABOLIC PANEL
Anion gap: 7 (ref 5–15)
BUN: 19 mg/dL (ref 6–20)
CALCIUM: 8.2 mg/dL — AB (ref 8.9–10.3)
CO2: 39 mmol/L — ABNORMAL HIGH (ref 22–32)
Chloride: 88 mmol/L — ABNORMAL LOW (ref 101–111)
Creatinine, Ser: 1.55 mg/dL — ABNORMAL HIGH (ref 0.61–1.24)
GFR calc Af Amer: 54 mL/min — ABNORMAL LOW (ref >60)
GFR, EST NON AFRICAN AMERICAN: 47 mL/min — AB (ref >60)
GLUCOSE: 132 mg/dL — AB (ref 65–99)
Potassium: 4.2 mmol/L (ref 3.5–5.1)
Sodium: 134 mmol/L — ABNORMAL LOW (ref 135–145)

## 2016-12-22 LAB — GLUCOSE, CAPILLARY
GLUCOSE-CAPILLARY: 95 mg/dL (ref 65–99)
GLUCOSE-CAPILLARY: 119 mg/dL — AB (ref 65–99)
Glucose-Capillary: 117 mg/dL — ABNORMAL HIGH (ref 65–99)
Glucose-Capillary: 147 mg/dL — ABNORMAL HIGH (ref 65–99)

## 2016-12-22 LAB — T4, FREE: FREE T4: 1.01 ng/dL (ref 0.61–1.12)

## 2016-12-22 MED ORDER — TRAMADOL HCL 50 MG PO TABS
50.0000 mg | ORAL_TABLET | Freq: Once | ORAL | Status: AC
  Administered 2016-12-22: 50 mg via ORAL
  Filled 2016-12-22: qty 1

## 2016-12-22 MED ORDER — BUMETANIDE 2 MG PO TABS
2.0000 mg | ORAL_TABLET | Freq: Two times a day (BID) | ORAL | Status: DC
  Filled 2016-12-22: qty 1

## 2016-12-22 NOTE — Progress Notes (Signed)
PROGRESS NOTE    Darrell Black  IZT:[id number] DOB: [birth year] DOA: 12/10/2016 PCP: Renata Caprice, DO   Brief Narrative: 61 year old male with past medical history of atrial fibrillation, chronic systolic/diastolic heart failure, diabetes mellitus, morbid obesity and obstructive sleep apnea brought to the emergency room on 9/30 for visual hallucinations 4 days. Initially, patient waiting for transfer to psychiatric facility but over the next 24 hours, developed worsening dyspnea and sedation, possibly from her psychotropic medications. He started to become hypoxic and felt to be in volume overload. Admitted to the hospitalist service at [facility] and placed in stepdown on 10/3.  Patient started on IV diuretics and initially responded well, diuresing over 4 L. Followed by tele-psychiatry.  Following hospitalization, patient started having some mild bleeding from his gums, treated with tranexemic acid and his eliquis was held. He continued to remain hypoxic, requiring BiPAP.  On night of 10/5, patient started having some gross blood per rectum. Remained hemodynamically stable, but hemoglobin dropped from 9.52 days prior to 8.8 by next morning. No hematemesis. PICC line placed 10/6. Patient remained unresponsive on BiPAP. Seen by GI and it was recommended he be transferred to critical care at [facility]. Patient's lack of responsiveness was felt to be secondary to acute psychosis plus hypercapnia plus polypharmacy. By holding sedating medications, patient became more awake by 10/8. Felt to be in acute psychosis and psychiatry consulted. Cardiology also consulted for acute on chronic heart failure.  Transfer back to hospitalist service on 10/9 morning. Cardiology recommended continuing Lasix. Since admission, he is now 14 L diuresed. GI recommended holding eliquis and planning for colonoscopy if unable to get reports previous colonoscopy or rectal bleeding persisted. 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  Improving. Off BIPAP.  Treated with IV lasix BID, change to bumex tomorrow.  Now on 2 L.  Check oxygen on ambulation.   Acute on chronic systolic congestive heart failure ([place]):  -Ejection fraction notes EF of 25-30 percent with atrial fibrillation and unable to ascertain diastolic dysfunction. -Cardiology following.  -holding further lasix dose. Start Bumex tomorrow.  -negtaive 16 L.   Major depression with Psychotic feature.  Clear by psych .  Needs to follow up outpatient for medications management.  No antipsychotic medications at this time.  Hold Requip which could cause psychosis.   GI bleed, recta bleeding.  Underwent colonoscopy, showed hemorrhoids, polyp, S/P polypectomy.  Hb stable.   Cellulitis; lower extremities;  On Ancef. Day 5. Change to doxy for 2 more days due to  Persistent redness.   Acute renal failure/acute kidney injury; Secondary to poor perfusion. Creatinine has been steadily improving. Peaked his high as 2.2. Down to 1.36, improving with diuretics Hold lasix. Repeat renal function in am.  Afib; holding anticoagulation due to GI bleed.  Started on apixiban   Restless leg syndrome: Per Psych  Requip  could cause psychosis, plan to hold that medication for now.  Start Gabapentin low dose.   Diabetes mellitus without complication ([place]):? Maybe erroneous diagnosis. A1c at 5.4 and patient not on home medications.  Morbid obesity due to excess calories [facility]) patient meets criteria with BMI greater than 40.  Hypernatremia; monitor on lasix. Resolved.   Hypokalemia; oral potassium supplements.  Fall; CT head negative.   DVT prophylaxis: SCD. No anticoagulation due to GI bleed  Code Status: full code.  Family Communication: care discussed with patient.  Disposition Plan: home when stable.    Consultants:   GI  Cardiology    Procedures:  ECHO; Procedure narrative: Contrast enhancement employed. - Left ventricle: The cavity size was normal. Wall thickness was   increased in a pattern of mild LVH. Systolic function was   moderately reduced. The estimated ejection fraction was in the   range of 35% to 40%. Diffuse hypokinesis. The study was not   technically sufficient to allow evaluation of LV diastolic   dysfunction due to atrial fibrillation. - Regional wall motion abnormality: Akinesis of the apical septal   and apical myocardium; moderate hypokinesis of the basal-mid    anteroseptal myocardium.     Antimicrobials:   Ancef   Subjective: He is alert, fell down yesterday.  Denies worsening dyspnea.     Objective: Vitals:   12/22/16 0142 12/22/16 0300 12/22/16 0337 12/22/16 1256  BP: 124/74  105/67 139/87  Pulse: 70  71 84  Resp:  19  (!) 24  Temp:   (!) 97.4 F (36.3 C) 98.2 F (36.8 C)  TempSrc:   Oral Oral  SpO2:  100%  97%  Weight:      Height:        Intake/Output Summary (Last 24 hours) at 12/22/16 1509 Last data filed at 12/22/16 1601  Gross per 24 hour  Intake              250 ml  Output              525 ml  Net             -275 ml   Filed Weights   12/17/16 1300 12/19/16 0358 12/20/16 0541  Weight: (!) 140.2 kg (309 lb) (!) 143.5 kg (316  lb 6.4 oz) (!) 143.2 kg (315 lb 12.8 oz)    Examination:  General exam: NAD Respiratory system: CTA Cardiovascular system: S 1, S 2 RRR Gastrointestinal system: BS present, soft, nt Central nervous system: non focal.  Extremities: bilateral edema, redness improved.       Data Reviewed: I have personally reviewed following labs and imaging studies  CBC:  Recent Labs Lab 12/15/16 2039 12/16/16 0248 12/16/16 1425 12/17/16 0430 12/17/16 0848 12/19/16 0434 12/21/16 0428 12/22/16 1230  WBC 5.5 6.1 5.6 6.5 6.6 6.5 4.7 7.3  NEUTROABS 4.4 4.9 4.3 5.1 5.0  --   --   --   HGB 8.7* 8.9* 8.9* 10.0* 10.1* 10.4* 10.0* 10.2*  HCT 30.9* 31.0* 31.9* 35.3* 35.3* 35.8* 33.3* 34.5*  MCV 101.0* 100.3* 100.6* 99.2 99.4 96.2 96.8 95.6  PLT 180 170 184 191 188 234 194 093   Basic Metabolic Panel:  Recent Labs Lab 12/16/16 0248 12/17/16 0430 12/19/16 0434 12/21/16 0428 12/22/16 1230  NA 148* 148* 138 139 134*  K 3.6 3.5 3.4* 3.8 4.2  CL  92* 90* 84* 89* 88*  CO2 47* 48* 43* 42* 39*  GLUCOSE 86 97 107* 113* 132*  BUN 38* 26* 19 17 19   CREATININE 1.61* 1.36* 1.28* 1.25* 1.55*  CALCIUM 8.6* 8.7* 8.3* 8.4* 8.2*  MG 1.7 1.6*  --   --   --   PHOS 2.4* 2.7  --   --   --    GFR: Estimated Creatinine Clearance: 69.6 mL/min (A) (by C-G formula based on SCr of 1.55 mg/dL (H)). Liver Function Tests:  Recent Labs Lab 12/15/16 2039 12/19/16 0434  AST 112* 48*  ALT 84* 27  ALKPHOS 166* 141*  BILITOT 1.4* 1.4*  PROT 6.2* 6.6  ALBUMIN 2.9* 3.0*   No results for input(s): LIPASE, AMYLASE in the last 168 hours.  Recent Labs Lab 12/15/16 2039 12/17/16 0430  AMMONIA 24 20   Coagulation Profile:  Recent Labs Lab 12/15/16 2039  INR 1.90   Cardiac Enzymes: No results for input(s): CKTOTAL, CKMB, CKMBINDEX, TROPONINI in the last 168 hours. BNP (last 3 results) No results for input(s): PROBNP in the last 8760 hours. HbA1C: No results for input(s): HGBA1C in the last 72  hours. CBG:  Recent Labs Lab 12/21/16 0615 12/21/16 1116 12/21/16 1640 12/21/16 2124 12/22/16 0637  GLUCAP 158* 94 120* 126* 95   Lipid Profile: No results for input(s): CHOL, HDL, LDLCALC, TRIG, CHOLHDL, LDLDIRECT in the last 72 hours. Thyroid Function Tests:  Recent Labs  12/21/16 1028 12/22/16 1230  TSH 11.211*  --   FREET4  --  1.01   Anemia Panel: No results for input(s): VITAMINB12, FOLATE, FERRITIN, TIBC, IRON, RETICCTPCT in the last 72 hours. Sepsis Labs:  Recent Labs Lab 12/16/16 1550  LATICACIDVEN 1.0    No results found for this or any previous visit (from the past 240 hour(s)).       Radiology Studies: Ct Head Wo Contrast  Result Date: 12/22/2016 CLINICAL DATA:  Status post fall.  Hit head.  Initial encounter. EXAM: CT HEAD WITHOUT CONTRAST TECHNIQUE: Contiguous axial images were obtained from the base of the skull through the vertex without intravenous contrast. COMPARISON:  None. FINDINGS: Brain: No evidence of acute infarction, hemorrhage, hydrocephalus, extra-axial collection or mass lesion/mass effect. The posterior fossa, including the cerebellum, brainstem and fourth ventricle, is within normal limits. The third and lateral ventricles, and basal ganglia are unremarkable in appearance. The cerebral hemispheres are symmetric in appearance, with normal gray-white differentiation. No mass effect or midline shift is seen. Vascular: No hyperdense vessel or unexpected calcification. Skull: There is no evidence of fracture; multiple maxillary dental caries are noted. Sinuses/Orbits: The orbits are within normal limits. There is mild partial opacification of the left mastoid air cells. The paranasal sinuses and right mastoid air cells are well-aerated. Other: Mild soft tissue swelling is noted at the occiput. IMPRESSION: 1. No evidence of traumatic intracranial injury or fracture. 2. Mild soft tissue swelling at the occiput. 3. Multiple maxillary dental caries  noted. 4. Mild partial opacification of the left mastoid air cells. Electronically Signed   By: Garald Balding M.D.   On: 12/22/2016 03:28        Scheduled Meds: . amiodarone  200 mg Oral Daily  . apixaban  5 mg Oral BID  . atorvastatin  80 mg Oral q1800  . [START ON 12/23/2016] bumetanide  2 mg Oral BID  . carvedilol  3.125 mg Oral BID WC  . gabapentin  200 mg Oral QHS  . insulin aspart  0-15 Units  Subcutaneous TID WC  . potassium chloride  40 mEq Oral Q12H  . sodium chloride flush  10-40 mL Intracatheter Q12H  . sulfamethoxazole-trimethoprim  1 tablet Oral Q12H   Continuous Infusions:    LOS: 10 days    Time spent: 35 minutes.     Elmarie Shiley, MD [facility] Pager [phone]  If 7PM-7AM, please contact night-coverage [email or web address] Password [email or web address] 12/22/2016, 3:09 PM

## 2016-12-22 NOTE — Progress Notes (Signed)
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 Now 16L negative. Creatinine starting to rise today.   Objective   Vitals:   12/22/16 0142 12/22/16 0300 12/22/16 0337 12/22/16 1256  BP: 124/74  105/67 139/87  Pulse: 70  71 84  Resp:  19  (!) 24  Temp:   (!) 97.4 F (36.3 C) 98.2 F (36.8 C)  TempSrc:   Oral Oral  SpO2:  100%  97%  Weight:      Height:        Intake/Output Summary (Last 24 hours) at 12/22/16 1307 Last data filed at 12/22/16 1884  Gross per 24 hour  Intake              250 ml  Output              525 ml  Net             -275 ml   Filed Weights   12/17/16 1300 12/19/16 0358 12/20/16 0541  Weight: (!) 309 lb (140.2 kg) (!) 316 lb 6.4 oz (143.5 kg) (!) 315 lb 12.8 oz (143.2 kg)    Physical Exam   General appearance: alert, no distress and morbidly obese Neck: no carotid bruit, no JVD and thyroid not enlarged, symmetric, no tenderness/mass/nodules Lungs: clear to auscultation bilaterally Heart: irregularly irregular rhythm Abdomen: soft, non-tender; bowel sounds normal; no masses,  no organomegaly and morbidly obese Extremities: edema trace to 1+ LE edema Skin: pale, warm, dry Neurologic: Grossly normal  Inpatient Medications      Scheduled Meds: . amiodarone  200 mg Oral Daily  . apixaban  5 mg Oral BID  . atorvastatin  80 mg Oral q1800  . carvedilol  3.125 mg Oral BID WC  . furosemide  80 mg Intravenous Q12H  . gabapentin  200 mg Oral QHS  . insulin aspart  0-15 Units Subcutaneous TID WC  . potassium chloride  40 mEq Oral Q12H  . sodium chloride flush  10-40 mL Intracatheter Q12H  . sulfamethoxazole-trimethoprim  1 tablet Oral Q12H    Continuous Infusions:   PRN Meds: acetaminophen, albuterol, metoprolol tartrate, nitroGLYCERIN, ondansetron **OR** ondansetron (ZOFRAN) IV, sodium chloride flush   Labs   Results for orders placed or performed during the hospital encounter of 12/10/16 (from the past 48 hour(s))  Glucose, capillary     Status: Abnormal   Collection Time: 12/20/16  8:50 PM  Result Value Ref Range   Glucose-Capillary 402 (H) 65 - 99 mg/dL  CBC     Status: Abnormal   Collection Time: 12/21/16  4:28 AM  Result Value Ref Range   WBC 4.7 4.0 - 10.5 K/uL   RBC 3.44 (L) 4.22 - 5.81 MIL/uL  Hemoglobin 10.0 (L) 13.0 - 17.0 g/dL   HCT 33.3 (L) 39.0 - 52.0 %   MCV 96.8 78.0 - 100.0 fL   MCH 29.1 26.0 - 34.0 pg   MCHC 30.0 30.0 - 36.0 g/dL   RDW 15.7 (H) 11.5 - 15.5 %   Platelets 194 150 - 400 K/uL  Basic metabolic panel     Status: Abnormal   Collection Time: 12/21/16  4:28 AM  Result Value Ref Range   Sodium 139 135 - 145 mmol/L   Potassium 3.8 3.5 - 5.1 mmol/L   Chloride 89 (L) 101 - 111 mmol/L   CO2 42 (H) 22 - 32 mmol/L   Glucose, Bld 113 (H) 65 - 99 mg/dL   BUN 17 6 - 20 mg/dL   Creatinine, Ser 1.25 (H) 0.61 - 1.24 mg/dL   Calcium 8.4 (L) 8.9 - 10.3 mg/dL   GFR calc non Af Amer >60 >60 mL/min   GFR calc Af Amer >60 >60 mL/min    Comment: (NOTE) The eGFR has been calculated using the CKD EPI equation. This calculation has not been validated in all clinical situations. eGFR's persistently <60 mL/min signify possible Chronic Kidney Disease.    Anion gap 8 5 - 15  Glucose,  capillary     Status: Abnormal   Collection Time: 12/21/16  6:15 AM  Result Value Ref Range   Glucose-Capillary 158 (H) 65 - 99 mg/dL  TSH     Status: Abnormal   Collection Time: 12/21/16 10:28 AM  Result Value Ref Range   TSH 11.211 (H) 0.350 - 4.500 uIU/mL    Comment: Performed by a 3rd Generation assay with a functional sensitivity of <=0.01 uIU/mL.  Glucose, capillary     Status: None   Collection Time: 12/21/16 11:16 AM  Result Value Ref Range   Glucose-Capillary 94 65 - 99 mg/dL   Comment 1 Notify RN   Glucose, capillary     Status: Abnormal   Collection Time: 12/21/16  4:40 PM  Result Value Ref Range   Glucose-Capillary 120 (H) 65 - 99 mg/dL   Comment 1 Notify RN   Glucose, capillary     Status: Abnormal   Collection Time: 12/21/16  9:24 PM  Result Value Ref Range   Glucose-Capillary 126 (H) 65 - 99 mg/dL   Comment 1 Notify RN    Comment 2 Document in Chart   Glucose, capillary     Status: None   Collection Time: 12/22/16  6:37 AM  Result Value Ref Range   Glucose-Capillary 95 65 - 99 mg/dL   Comment 1 Notify RN    Comment 2 Document in Chart   Basic metabolic panel     Status: Abnormal   Collection Time: 12/22/16 12:30 PM  Result Value Ref Range   Sodium 134 (L) 135 - 145 mmol/L   Potassium 4.2 3.5 - 5.1 mmol/L   Chloride 88 (L) 101 - 111 mmol/L   CO2 39 (H) 22 - 32 mmol/L   Glucose, Bld 132 (H) 65 - 99 mg/dL   BUN 19 6 - 20 mg/dL   Creatinine, Ser 1.55 (H) 0.61 - 1.24 mg/dL   Calcium 8.2 (L) 8.9 - 10.3 mg/dL   GFR calc non Af Amer 47 (L) >60 mL/min   GFR calc Af Amer 54 (L) >60 mL/min    Comment: (NOTE) The eGFR has been calculated using the CKD EPI equation. This calculation has not been validated in all clinical situations. eGFR's persistently <60 mL/min  signify possible Chronic Kidney Disease.    Anion gap 7 5 - 15  CBC     Status: Abnormal   Collection Time: 12/22/16 12:30 PM  Result Value Ref Range   WBC 7.3 4.0 - 10.5 K/uL   RBC 3.61 (L) 4.22 -  5.81 MIL/uL   Hemoglobin 10.2 (L) 13.0 - 17.0 g/dL   HCT 34.5 (L) 39.0 - 52.0 %   MCV 95.6 78.0 - 100.0 fL   MCH 28.3 26.0 - 34.0 pg   MCHC 29.6 (L) 30.0 - 36.0 g/dL   RDW 15.5 11.5 - 15.5 %   Platelets 236 150 - 400 K/uL    ECG   N/A - Personally Reviewed  Telemetry   Sinus - Personally Reviewed  Radiology    Ct Head Wo Contrast  Result Date: 12/22/2016 CLINICAL DATA:  Status post fall.  Hit head.  Initial encounter. EXAM: CT HEAD WITHOUT CONTRAST TECHNIQUE: Contiguous axial images were obtained from the base of the skull through the vertex without intravenous contrast. COMPARISON:  None. FINDINGS: Brain: No evidence of acute infarction, hemorrhage, hydrocephalus, extra-axial collection or mass lesion/mass effect. The posterior fossa, including the cerebellum, brainstem and fourth ventricle, is within normal limits. The third and lateral ventricles, and basal ganglia are unremarkable in appearance. The cerebral hemispheres are symmetric in appearance, with normal gray-white differentiation. No mass effect or midline shift is seen. Vascular: No hyperdense vessel or unexpected calcification. Skull: There is no evidence of fracture; multiple maxillary dental caries are noted. Sinuses/Orbits: The orbits are within normal limits. There is mild partial opacification of the left mastoid air cells. The paranasal sinuses and right mastoid air cells are well-aerated. Other: Mild soft tissue swelling is noted at the occiput. IMPRESSION: 1. No evidence of traumatic intracranial injury or fracture. 2. Mild soft tissue swelling at the occiput. 3. Multiple maxillary dental caries noted. 4. Mild partial opacification of the left mastoid air cells. Electronically Signed   By: Garald Balding M.D.   On: 12/22/2016 03:28    Cardiac Studies   N/A  Assessment   1. Active Problems: 2.   Obstructive sleep apnea 3.   Acute respiratory failure with hypoxia and hypercarbia (HCC) 4.   Acute psychosis  ([place]) 5.   Atrial fibrillation ([place]) 6.   Oral bleeding 7.   Coronary artery disease 8.   Restless leg syndrome 9.   Hypertension 10.   Hyperlipidemia 11.   Hyperammonemia ([place]) 12.   Visual hallucination 13.   Acute on chronic systolic congestive heart failure ([place]) 14.   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   Rectal bleeding 16.   Morbid obesity due to excess calories ([place]) 17.   Hypernatremia 18.   Polyp of sigmoid colon 19.   Plan   1. Hold further lasix today and switch to home dose bumex 2 mg BID tomorrow.  Time Spent Directly with Patient:  I have spent a total of 15 minutes with the patient reviewing hospital notes, telemetry, EKGs, labs and examining the patient as well as establishing an assessment and plan that was discussed personally with the patient. > 50% of time was spent in direct patient care.  Length of Stay:  LOS: 10 days   Pixie Casino, MD, [place]  Attending Cardiologist  Direct Dial: [phone]  Fax: [phone]  Website:  [email or web address] 12/22/2016, 1:07 PM

## 2016-12-23 ENCOUNTER — Inpatient Hospital Stay (HOSPITAL_COMMUNITY): Payer: Medicaid Other

## 2016-12-23 LAB — BASIC METABOLIC PANEL
ANION GAP: 6 (ref 5–15)
BUN: 22 mg/dL — ABNORMAL HIGH (ref 6–20)
CO2: 39 mmol/L — ABNORMAL HIGH (ref 22–32)
Calcium: 8.4 mg/dL — ABNORMAL LOW (ref 8.9–10.3)
Chloride: 91 mmol/L — ABNORMAL LOW (ref 101–111)
Creatinine, Ser: 1.78 mg/dL — ABNORMAL HIGH (ref 0.61–1.24)
GFR calc Af Amer: 46 mL/min — ABNORMAL LOW (ref >60)
GFR, EST NON AFRICAN AMERICAN: 39 mL/min — AB (ref >60)
Glucose, Bld: 123 mg/dL — ABNORMAL HIGH (ref 65–99)
POTASSIUM: 4.5 mmol/L (ref 3.5–5.1)
SODIUM: 136 mmol/L (ref 135–145)

## 2016-12-23 LAB — GLUCOSE, CAPILLARY: Glucose-Capillary: 100 mg/dL — ABNORMAL HIGH (ref 65–99)

## 2016-12-23 LAB — T3, FREE: T3 FREE: 2.4 pg/mL (ref 2.0–4.4)

## 2016-12-23 MED ORDER — KETOROLAC TROMETHAMINE 30 MG/ML IJ SOLN
30.0000 mg | Freq: Four times a day (QID) | INTRAMUSCULAR | Status: DC
  Administered 2016-12-24 (×2): 30 mg via INTRAVENOUS
  Filled 2016-12-23 (×2): qty 1

## 2016-12-23 MED ORDER — LORAZEPAM 2 MG/ML IJ SOLN
1.0000 mg | Freq: Once | INTRAMUSCULAR | Status: AC
  Administered 2016-12-23: 1 mg via INTRAVENOUS
  Filled 2016-12-23: qty 1

## 2016-12-23 NOTE — Progress Notes (Signed)
PROGRESS NOTE    Darrell Black  BZJ:[id number] DOB: 1955/07/31 DOA: 12/10/2016 PCP: Renata Caprice, DO   Brief Narrative: 61 year old male with past medical history of atrial fibrillation, chronic systolic/diastolic heart failure, diabetes mellitus, morbid obesity and obstructive sleep apnea brought to the emergency room on 9/30 for visual hallucinations 4 days. Initially, patient waiting for transfer to psychiatric facility but over the next 24 hours, developed worsening dyspnea and sedation, possibly from her psychotropic medications. He started to become hypoxic and felt to be in volume overload. Admitted to the hospitalist service at [facility] and placed in stepdown on 10/3.  Patient started on IV diuretics and initially responded well, diuresing over 4 L. Followed by tele-psychiatry.  Following hospitalization, patient started having some mild bleeding from his gums, treated with tranexemic acid and his eliquis was held. He continued to remain hypoxic, requiring BiPAP.  On night of 10/5, patient started having some gross blood per rectum. Remained hemodynamically stable, but hemoglobin dropped from 9.52 days prior to 8.8 by next morning. No hematemesis. PICC line placed 10/6. Patient remained unresponsive on BiPAP. Seen by GI and it was recommended he be transferred to critical care at [facility]. Patient's lack of responsiveness was felt to be secondary to acute psychosis plus hypercapnia plus polypharmacy. By holding sedating medications, patient became more awake by 10/8. Felt to be in acute psychosis and psychiatry consulted. Cardiology also consulted for acute on chronic heart failure.  Transfer back to hospitalist service on 10/9 morning. Cardiology recommended continuing Lasix. Since admission, he is now 14 L diuresed. GI recommended holding eliquis and planning for colonoscopy if unable to get reports previous colonoscopy or rectal bleeding persisted. 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  Improving. Off BIPAP.  Treated with IV lasix BID Now on 2 L.  Check oxygen on ambulation.  Holding Bumex duet to increase cr.   Acute on chronic systolic congestive heart failure ([place]):  -Ejection fraction notes EF of 25-30 percent with atrial fibrillation and unable to ascertain diastolic dysfunction. -Cardiology following.  -holding further lasix dose.  -negtaive 16 L.  -hold bumex due to increase cr.   Dizziness, headaches;  He is complaining of dizziness and headaches.  Orthostatic negative.  Check CT head.   Major depression with Psychotic feature.  Clear by psych .  Needs to follow up outpatient for medications management.  No antipsychotic medications at this time.  Hold Requip which could cause psychosis.   GI bleed, recta bleeding.  Underwent colonoscopy, showed hemorrhoids, polyp, S/P polypectomy.  Hb stable.   Cellulitis; lower extremities;  On Ancef. Day 5. Change to doxy for 2 more days due to  Persistent redness.   Acute renal failure/acute kidney injury; Secondary to poor  perfusion. Creatinine has been steadily improving. Peaked his high as 2.2. Down to 1.36, improving with diuretics Hold bumex, repeat labs in  am.   Afib; holding anticoagulation due to GI bleed.  Hold apixiban due to recent fall, PT evaluation.   Restless leg syndrome: Per Psych  Requip could cause psychosis, plan to hold that medication for now.  Start Gabapentin low dose.   Diabetes mellitus without complication ([place]):? Maybe erroneous diagnosis. A1c at 5.4 and patient not on home medications.  Morbid obesity due to excess calories [facility]) patient meets criteria with BMI greater than 40.  Hypernatremia; monitor on lasix. Resolved.   Hypokalemia; Darrell potassium supplements.  Fall; CT head negative.  Elevated TSH; free t 3 and Free  t 4 normal.   DVT prophylaxis: SCD. No anticoagulation due to GI bleed  Code Status: full code.  Family Communication: care discussed with patient.  Disposition Plan: home when stable.    Consultants:   GI  Cardiology    Procedures:  ECHO; Procedure narrative: Contrast enhancement employed. - Left ventricle: The cavity size was normal. Wall thickness was   increased in a pattern of mild LVH. Systolic function was   moderately reduced. The estimated ejection fraction was in the   range of 35% to 40%. Diffuse hypokinesis. The study was not   technically sufficient to allow evaluation of LV diastolic   dysfunction due to atrial fibrillation. - Regional wall motion abnormality: Akinesis of the apical septal   and apical myocardium; moderate hypokinesis of the basal-mid    anteroseptal myocardium.     Antimicrobials:   Ancef   Subjective: He is feeling dizzy, complaining of headaches.     Objective: Vitals:   12/23/16 0817 12/23/16 0922 12/23/16 0928 12/23/16 1024  BP: 114/72 103/63 105/74   Pulse: 70   72  Resp:      Temp:      TempSrc:      SpO2:      Weight:      Height:        Intake/Output Summary (Last 24 hours) at 12/23/16 1555 Last data filed at 12/23/16 1305  Gross per 24 hour  Intake              852 ml  Output              225 ml  Net               627 ml   Filed Weights   12/17/16 1300 12/19/16 0358 12/20/16 0541  Weight: (!) 140.2 kg (309 lb) (!) 143.5 kg (316 lb 6.4 oz) (!) 143.2 kg (315 lb 12.8 oz)    Examination:  General exam: NAD Respiratory system: CTA Cardiovascular system: S 1, S 2 RRR Gastrointestinal system: Bs,  present, soft, NT Central nervous system: non focal.  Extremities: bilateral edema, redness improved.       Data Reviewed: I have personally reviewed following labs and imaging studies  CBC:  Recent Labs Lab 12/17/16 0430 12/17/16 0848 12/19/16 0434 12/21/16 0428 12/22/16 1230  WBC 6.5 6.6 6.5 4.7 7.3  NEUTROABS 5.1 5.0  --   --   --   HGB 10.0* 10.1* 10.4* 10.0* 10.2*  HCT 35.3* 35.3* 35.8* 33.3* 34.5*  MCV 99.2 99.4 96.2 96.8 95.6  PLT 191 188 234 194 016   Basic Metabolic Panel:  Recent Labs Lab 12/17/16 0430 12/19/16 0434 12/21/16 0428 12/22/16 1230 12/23/16 0349  NA 148* 138 139 134* 136  K 3.5 3.4* 3.8 4.2 4.5  CL 90* 84* 89* 88* 91*  CO2 48* 43* 42* 39* 39*  GLUCOSE 97 107* 113* 132* 123*  BUN 26* 19 17 19  22*  CREATININE 1.36* 1.28* 1.25* 1.55* 1.78*  CALCIUM 8.7* 8.3* 8.4* 8.2* 8.4*  MG 1.6*  --   --   --   --   PHOS 2.7  --   --   --   --    GFR: Estimated Creatinine Clearance: 60.6 mL/min (A) (by C-G formula based on SCr of 1.78 mg/dL (H)). Liver Function Tests:  Recent Labs Lab 12/19/16 0434  AST 48*  ALT 27  ALKPHOS 141*  BILITOT 1.4*  PROT 6.6  ALBUMIN 3.0*   No results for input(s): LIPASE, AMYLASE in the last 168 hours.  Recent Labs Lab 12/17/16 0430  AMMONIA 20   Coagulation Profile: No results for input(s): INR, PROTIME in the last 168 hours. Cardiac Enzymes: No results for input(s): CKTOTAL, CKMB, CKMBINDEX, TROPONINI in the last 168 hours. BNP (last 3 results) No results for input(s): PROBNP in the last 8760 hours. HbA1C: No results for input(s): HGBA1C in the last 72 hours. CBG:  Recent Labs Lab 12/22/16 0637 12/22/16 1328  12/22/16 1631 12/22/16 2142 12/23/16 1141  GLUCAP 95 119* 117* 147* 100*   Lipid Profile: No results for input(s): CHOL, HDL, LDLCALC, TRIG, CHOLHDL, LDLDIRECT in the last 72 hours. Thyroid Function Tests:  Recent Labs  12/21/16 1028 12/22/16 1230  TSH 11.211*  --   FREET4  --  1.01  T3FREE  --  2.4   Anemia Panel: No results for input(s): VITAMINB12, FOLATE, FERRITIN, TIBC, IRON, RETICCTPCT in the last 72 hours. Sepsis Labs: No results for input(s): PROCALCITON, LATICACIDVEN in the last 168 hours.  No results found for this or any previous visit (from the past 240 hour(s)).       Radiology Studies: Ct Head Wo Contrast  Result Date: 12/23/2016 CLINICAL DATA:  61 year old male status post fall. Headache. A fib. EXAM: CT HEAD WITHOUT CONTRAST TECHNIQUE: Contiguous axial images were obtained from the base of the skull through the vertex without intravenous contrast. COMPARISON:  11/22/2016 FINDINGS: Brain: Cerebral volume is normal for age. No midline shift, ventriculomegaly, mass effect, evidence of mass lesion, intracranial hemorrhage or evidence of cortically based acute infarction. Gray-white matter differentiation is within normal limits throughout the brain. Vascular: Calcified atherosclerosis at the skull base. No suspicious intracranial vascular hyperdensity. Skull: No acute osseous abnormality identified. Sinuses/Orbits: Visualized paranasal sinuses and mastoids are stable and well pneumatized. Other: Decreased scalp soft tissue stranding and swelling posteriorly near midline (series 4, image 52. Otherwise visible orbits and scalp soft tissues are within normal limits. IMPRESSION: 1. Stable and normal for age noncontrast CT appearance of the brain. 2. Mild regression of left posterior scalp contusion. Electronically Signed   By: Genevie Ann M.D.   On: 12/23/2016 15:43   Ct Head Wo Contrast  Result Date: 12/22/2016 CLINICAL DATA:  Status post fall.  Hit head.  Initial  encounter. EXAM: CT HEAD WITHOUT CONTRAST TECHNIQUE: Contiguous axial images were obtained from the base of the skull through the vertex without intravenous contrast. COMPARISON:  None. FINDINGS: Brain: No evidence of acute infarction, hemorrhage, hydrocephalus, extra-axial collection or mass lesion/mass effect. The posterior fossa, including the cerebellum, brainstem and fourth ventricle, is within normal limits. The third and lateral ventricles, and basal ganglia are unremarkable in appearance. The cerebral hemispheres are symmetric in appearance, with normal gray-white differentiation.  No mass effect or midline shift is seen. Vascular: No hyperdense vessel or unexpected calcification. Skull: There is no evidence of fracture; multiple maxillary dental caries are noted. Sinuses/Orbits: The orbits are within normal limits. There is mild partial opacification of the left mastoid air cells. The paranasal sinuses and right mastoid air cells are well-aerated. Other: Mild soft tissue swelling is noted at the occiput. IMPRESSION: 1. No evidence of traumatic intracranial injury or fracture. 2. Mild soft tissue swelling at the occiput. 3. Multiple maxillary dental caries noted. 4. Mild partial opacification of the left mastoid air cells. Electronically Signed   By: Garald Balding M.D.   On: 12/22/2016 03:28        Scheduled Meds: . amiodarone  200 mg Darrell Daily  . atorvastatin  80 mg Darrell q1800  . carvedilol  3.125 mg Darrell BID WC  . gabapentin  200 mg Darrell QHS  . insulin aspart  0-15 Units Subcutaneous TID WC  . sodium chloride flush  10-40 mL Intracatheter Q12H  . sulfamethoxazole-trimethoprim  1 tablet Darrell Q12H   Continuous Infusions:    LOS: 11 days    Time spent: 35 minutes.     Elmarie Shiley, MD [facility] Pager [phone]  If 7PM-7AM, please contact night-coverage [email or web address] Password [email or web address] 12/23/2016, 3:55 PM

## 2016-12-23 NOTE — Progress Notes (Signed)
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 Creatinine up further at 1.78.  Objective   Vitals:   12/23/16 0817 12/23/16 0922 12/23/16 0928 12/23/16 1024  BP: 114/72 103/63 105/74   Pulse: 70   72  Resp:      Temp:      TempSrc:      SpO2:      Weight:      Height:        Intake/Output Summary (Last 24 hours) at 12/23/16 1211 Last data filed at 12/23/16 9794  Gross per 24 hour  Intake              630 ml  Output              225 ml  Net              405 ml   Filed Weights   12/17/16 1300 12/19/16 0358 12/20/16 0541  Weight: (!) 309 lb (140.2 kg) (!) 316 lb 6.4 oz (143.5 kg) (!) 315 lb 12.8 oz (143.2 kg)    Physical Exam   General appearance: alert, no distress and morbidly obese Neck: no carotid bruit, no JVD and thyroid not enlarged, symmetric, no tenderness/mass/nodules Lungs: clear to auscultation bilaterally Heart: irregularly irregular rhythm and S1, S2 normal Abdomen: soft, non-tender; bowel sounds normal; no masses,  no organomegaly and morbidly obese Extremities: edema trace edema Skin: pale, warm, dry Neurologic: Grossly normal  Inpatient Medications    Scheduled Meds: . amiodarone  200 mg Oral Daily    . atorvastatin  80 mg Oral q1800  . carvedilol  3.125 mg Oral BID WC  . gabapentin  200 mg Oral QHS  . insulin aspart  0-15 Units Subcutaneous TID WC  . sodium chloride flush  10-40 mL Intracatheter Q12H  . sulfamethoxazole-trimethoprim  1 tablet Oral Q12H    Continuous Infusions:   PRN Meds: acetaminophen, albuterol, metoprolol tartrate, nitroGLYCERIN, ondansetron **OR** ondansetron (ZOFRAN) IV, sodium chloride flush   Labs   Results for orders placed or performed during the hospital encounter of 12/10/16 (from the past 48 hour(s))  Glucose, capillary     Status: Abnormal   Collection Time: 12/21/16  4:40 PM  Result Value Ref Range   Glucose-Capillary 120 (H) 65 - 99 mg/dL   Comment 1 Notify RN   Glucose, capillary     Status: Abnormal   Collection Time: 12/21/16  9:24 PM  Result Value Ref Range   Glucose-Capillary 126 (H) 65 - 99 mg/dL   Comment 1 Notify RN    Comment 2 Document in Chart   Glucose, capillary     Status: None   Collection Time: 12/22/16  6:37 AM  Result Value Ref Range   Glucose-Capillary 95 65 - 99 mg/dL   Comment 1 Notify RN    Comment 2 Document in Chart   T3, free     Status: None   Collection Time: 12/22/16 12:30 PM  Result Value Ref Range   T3, Free 2.4 2.0 - 4.4 pg/mL    Comment: (NOTE) Performed At: [facility] [place], [place] [id number] Lindon Romp MD [id number]   T4, free     Status: None   Collection Time: 12/22/16 12:30 PM  Result Value Ref Range   Free T4 1.01 0.61 - 1.12 ng/dL    Comment: (NOTE) Biotin ingestion may interfere with free T4 tests. If the results are inconsistent with the TSH level, previous test results, or the clinical presentation, then consider biotin interference. If needed, order repeat testing after stopping biotin.   Basic metabolic panel     Status: Abnormal   Collection Time: 12/22/16 12:30 PM  Result Value Ref Range   Sodium 134 (L) 135 - 145 mmol/L   Potassium 4.2 3.5  - 5.1 mmol/L   Chloride 88 (L) 101 - 111 mmol/L   CO2 39 (H) 22 - 32 mmol/L   Glucose, Bld 132 (H) 65 - 99 mg/dL   BUN 19 6 - 20 mg/dL   Creatinine, Ser 1.55 (H) 0.61 - 1.24 mg/dL   Calcium 8.2 (L) 8.9 - 10.3 mg/dL   GFR calc non Af Amer 47 (L) >60 mL/min   GFR calc Af Amer 54 (L) >60 mL/min    Comment: (NOTE) The eGFR has been calculated using the CKD EPI equation. This calculation has not been validated in all clinical situations. eGFR's persistently <60 mL/min signify possible Chronic Kidney Disease.    Anion gap 7 5 - 15  CBC     Status: Abnormal   Collection Time: 12/22/16 12:30 PM  Result Value Ref Range   WBC 7.3 4.0 - 10.5 K/uL   RBC 3.61 (L) 4.22 - 5.81 MIL/uL   Hemoglobin 10.2 (L) 13.0 - 17.0 g/dL   HCT 34.5 (L) 39.0 - 52.0 %   MCV 95.6 78.0 - 100.0 fL   MCH 28.3 26.0 - 34.0 pg   MCHC 29.6 (L) 30.0 - 36.0 g/dL   RDW 15.5 11.5 - 15.5 %   Platelets 236 150 - 400 K/uL  Glucose, capillary     Status: Abnormal   Collection Time: 12/22/16  1:28 PM  Result Value Ref Range   Glucose-Capillary 119 (H) 65 - 99 mg/dL  Glucose, capillary     Status: Abnormal   Collection Time: 12/22/16  4:31 PM  Result Value Ref Range   Glucose-Capillary 117 (H) 65 - 99 mg/dL   Comment 1 Notify RN    Comment 2 Document in Chart   Glucose, capillary     Status: Abnormal   Collection Time: 12/22/16  9:42 PM  Result Value Ref Range   Glucose-Capillary 147 (H) 65 - 99 mg/dL  Basic metabolic panel     Status: Abnormal   Collection Time: 12/23/16  3:49 AM  Result Value Ref Range   Sodium 136 135 - 145 mmol/L   Potassium 4.5 3.5 - 5.1 mmol/L   Chloride 91 (L) 101 - 111 mmol/L   CO2 39 (H) 22 - 32 mmol/L   Glucose, Bld 123 (H) 65 - 99 mg/dL   BUN 22 (H) 6 - 20 mg/dL   Creatinine, Ser 1.78 (H) 0.61 -  1.24 mg/dL   Calcium 8.4 (L) 8.9 - 10.3 mg/dL   GFR calc non Af Amer 39 (L) >60 mL/min   GFR calc Af Amer 46 (L) >60 mL/min    Comment: (NOTE) The eGFR has been calculated using the CKD EPI  equation. This calculation has not been validated in all clinical situations. eGFR's persistently <60 mL/min signify possible Chronic Kidney Disease.    Anion gap 6 5 - 15  Glucose, capillary     Status: Abnormal   Collection Time: 12/23/16 11:41 AM  Result Value Ref Range   Glucose-Capillary 100 (H) 65 - 99 mg/dL   Comment 1 Notify RN    Comment 2 Document in Chart     ECG   N/A - Personally Reviewed  Telemetry   Sinus - Personally Reviewed  Radiology    Ct Head Wo Contrast  Result Date: 12/22/2016 CLINICAL DATA:  Status post fall.  Hit head.  Initial encounter. EXAM: CT HEAD WITHOUT CONTRAST TECHNIQUE: Contiguous axial images were obtained from the base of the skull through the vertex without intravenous contrast. COMPARISON:  None. FINDINGS: Brain: No evidence of acute infarction, hemorrhage, hydrocephalus, extra-axial collection or mass lesion/mass effect. The posterior fossa, including the cerebellum, brainstem and fourth ventricle, is within normal limits. The third and lateral ventricles, and basal ganglia are unremarkable in appearance. The cerebral hemispheres are symmetric in appearance, with normal gray-white differentiation. No mass effect or midline shift is seen. Vascular: No hyperdense vessel or unexpected calcification. Skull: There is no evidence of fracture; multiple maxillary dental caries are noted. Sinuses/Orbits: The orbits are within normal limits. There is mild partial opacification of the left mastoid air cells. The paranasal sinuses and right mastoid air cells are well-aerated. Other: Mild soft tissue swelling is noted at the occiput. IMPRESSION: 1. No evidence of traumatic intracranial injury or fracture. 2. Mild soft tissue swelling at the occiput. 3. Multiple maxillary dental caries noted. 4. Mild partial opacification of the left mastoid air cells. Electronically Signed   By: Garald Balding M.D.   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 Creatinine rising again today- hold Bumex for now. Will need to consider restarting home dose for maintenance in the near future. Not on ACE-I or ARB.  Time Spent Directly with Patient:  I have spent a total of 15 minutes with the patient reviewing hospital notes, telemetry, EKGs, labs and examining the patient as well as establishing an assessment and plan that was discussed personally with the patient. > 50% of time was spent in direct patient care.  Length of Stay:  LOS: 11 days   Pixie Casino, MD, [place]  Attending Cardiologist  Direct Dial: [phone]  Fax: [phone]  Website:  [email or web address] 12/23/2016, 12:11 PM

## 2016-12-23 NOTE — Progress Notes (Signed)
Pt complained of headache which Tylenol didn't touch.  The on call hospitalist was asked if he could receive a different pain medicine and ordered a one time Tramadol 50 mg by mouth.  After med was given, pt said he felt alright but still had a headache and a one point felt dizzy.  Currently resting in chair with a chair alarm.  Will continue to monitor pt.  Lupita Dawn, RN

## 2016-12-24 ENCOUNTER — Inpatient Hospital Stay (HOSPITAL_COMMUNITY): Payer: Medicaid Other

## 2016-12-24 DIAGNOSIS — J9602 Acute respiratory failure with hypercapnia: Secondary | ICD-10-CM

## 2016-12-24 LAB — CBC
HCT: 33.1 % — ABNORMAL LOW (ref 39.0–52.0)
HEMOGLOBIN: 10 g/dL — AB (ref 13.0–17.0)
MCH: 29.1 pg (ref 26.0–34.0)
MCHC: 30.2 g/dL (ref 30.0–36.0)
MCV: 96.2 fL (ref 78.0–100.0)
Platelets: 266 10*3/uL (ref 150–400)
RBC: 3.44 MIL/uL — AB (ref 4.22–5.81)
RDW: 15.2 % (ref 11.5–15.5)
WBC: 7.5 10*3/uL (ref 4.0–10.5)

## 2016-12-24 LAB — BLOOD GAS, ARTERIAL
ACID-BASE EXCESS: 9.9 mmol/L — AB (ref 0.0–2.0)
Acid-Base Excess: 10.8 mmol/L — ABNORMAL HIGH (ref 0.0–2.0)
BICARBONATE: 36.4 mmol/L — AB (ref 20.0–28.0)
Bicarbonate: 37.2 mmol/L — ABNORMAL HIGH (ref 20.0–28.0)
DRAWN BY: 275531
Delivery systems: POSITIVE
Expiratory PAP: 8
FIO2: 0.4
Inspiratory PAP: 12
MODE: POSITIVE
O2 Content: 4 L/min
O2 SAT: 96.4 %
O2 Saturation: 97.3 %
PCO2 ART: 75.6 mmHg — AB (ref 32.0–48.0)
PH ART: 7.303 — AB (ref 7.350–7.450)
PH ART: 7.313 — AB (ref 7.350–7.450)
PO2 ART: 97.3 mmHg (ref 83.0–108.0)
Patient temperature: 98.6
Patient temperature: 98.6
RATE: 12 resp/min
pCO2 arterial: 75.8 mmHg (ref 32.0–48.0)
pO2, Arterial: 113 mmHg — ABNORMAL HIGH (ref 83.0–108.0)

## 2016-12-24 LAB — URINALYSIS, ROUTINE W REFLEX MICROSCOPIC
Bilirubin Urine: NEGATIVE
GLUCOSE, UA: NEGATIVE mg/dL
Hgb urine dipstick: NEGATIVE
KETONES UR: NEGATIVE mg/dL
LEUKOCYTES UA: NEGATIVE
Nitrite: NEGATIVE
PH: 5 (ref 5.0–8.0)
PROTEIN: NEGATIVE mg/dL
SPECIFIC GRAVITY, URINE: 1.018 (ref 1.005–1.030)

## 2016-12-24 LAB — BASIC METABOLIC PANEL
Anion gap: 9 (ref 5–15)
BUN: 31 mg/dL — AB (ref 6–20)
CHLORIDE: 89 mmol/L — AB (ref 101–111)
CO2: 36 mmol/L — ABNORMAL HIGH (ref 22–32)
CREATININE: 2.24 mg/dL — AB (ref 0.61–1.24)
Calcium: 8.4 mg/dL — ABNORMAL LOW (ref 8.9–10.3)
GFR calc Af Amer: 35 mL/min — ABNORMAL LOW (ref >60)
GFR calc non Af Amer: 30 mL/min — ABNORMAL LOW (ref >60)
GLUCOSE: 104 mg/dL — AB (ref 65–99)
POTASSIUM: 4.8 mmol/L (ref 3.5–5.1)
Sodium: 134 mmol/L — ABNORMAL LOW (ref 135–145)

## 2016-12-24 LAB — AMMONIA: AMMONIA: 30 umol/L (ref 9–35)

## 2016-12-24 LAB — LACTIC ACID, PLASMA
LACTIC ACID, VENOUS: 0.7 mmol/L (ref 0.5–1.9)
LACTIC ACID, VENOUS: 1 mmol/L (ref 0.5–1.9)

## 2016-12-24 LAB — PROCALCITONIN: Procalcitonin: <0.1 ng/mL

## 2016-12-24 LAB — GLUCOSE, CAPILLARY
Glucose-Capillary: 96 mg/dL (ref 65–99)
Glucose-Capillary: 94 mg/dL (ref 65–99)

## 2016-12-24 MED ORDER — FUROSEMIDE 10 MG/ML IJ SOLN
40.0000 mg | Freq: Once | INTRAMUSCULAR | Status: AC
  Administered 2016-12-24: 40 mg via INTRAVENOUS
  Filled 2016-12-24: qty 4

## 2016-12-24 MED ORDER — APIXABAN 5 MG PO TABS: 5.0000 mg | ORAL_TABLET | Freq: Two times a day (BID) | ORAL | Status: DC

## 2016-12-24 NOTE — NC FL2 (Signed)
[place] LEVEL OF CARE SCREENING TOOL     IDENTIFICATION  Patient Name: Darrell Black Birthdate: March 01, 1956 Sex: male Admission Date (Current Location): 12/10/2016  [facility] and [place] Number:  Manufacturing engineer and Address:  [facility] [place], [place] [address], [place], [place] [address]      Provider Number: [id number]  Attending Physician Name and Address:  Elmarie Shiley, MD  Relative Name and Phone Number:       Current Level of Care: Hospital Recommended Level of Care: [place] Prior Approval Number:    Date Approved/Denied:   PASRR Number: [id number]  Discharge Plan: SNF    Current Diagnoses: Patient Active Problem List   Diagnosis Date Noted  . Polyp of sigmoid colon   . Morbid obesity due to excess calories ([place]) 12/18/2016  . Hypernatremia 12/18/2016  . Rectal bleeding   . Acute on chronic respiratory failure with hypoxia and hypercapnia (HCC)   . Visual hallucination   . Acute systolic congestive heart failure ([place])   . Acute on chronic systolic congestive heart failure ([place])   . 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 Acute respiratory insufficiency 12/12/2016  . Obstructive sleep apnea 12/12/2016  . Acute respiratory failure with hypoxia and hypercarbia ([place]) 12/12/2016  . Acute psychosis ([place]) 12/12/2016  . Atrial fibrillation ([place]) 12/12/2016  . Oral bleeding 12/12/2016  . Coronary artery disease 12/12/2016  . Restless leg syndrome 12/12/2016  . Hypertension 12/12/2016  . Hyperlipidemia 12/12/2016  . Chronic combined systolic and diastolic heart failure ([place]) 12/12/2016  . Hyperammonemia ([place]) 12/12/2016    Orientation RESPIRATION BLADDER Height & Weight     Self, Time, Situation, Place  O2 (4L [place]) Continent Weight: (!) 315 lb 12.8 oz (143.2 kg) Height:  5\' 8"  (172.7 cm)  BEHAVIORAL SYMPTOMS/MOOD NEUROLOGICAL BOWEL  NUTRITION STATUS      Continent Diet (see DC summary)  AMBULATORY STATUS COMMUNICATION OF NEEDS Skin   Limited Assist Verbally Normal                       Personal Care Assistance Level of Assistance  Bathing, Dressing Bathing Assistance: Limited assistance   Dressing Assistance: Limited assistance     Functional Limitations Info             SPECIAL CARE FACTORS FREQUENCY                       Contractures      Additional Factors Info  Code Status, Allergies, Insulin Sliding Scale Code Status Info: FULL Allergies Info: Ampicillin, Clindamycin/lincomycin, Penicillins   Insulin Sliding Scale Info: 3/day       Current Medications (12/24/2016):  This is the current hospital active medication list Current Facility-Administered Medications  Medication Dose Route Frequency Provider Last Rate Last Dose  . acetaminophen (TYLENOL) tablet 650 mg  650 mg Oral Q6H PRN Vianne Bulls, MD   650 mg at 12/24/16 0849  . albuterol (PROVENTIL) (2.5 MG/3ML) 0.083% nebulizer solution 2.5 mg  2.5 mg Nebulization Q2H PRN Reubin Milan, MD      . amiodarone (PACERONE) tablet 200 mg  200 mg Oral Daily Jennelle Human B, NP   200 mg at 12/24/16 0850  . apixaban (ELIQUIS) tablet 5 mg  5 mg Oral BID Hammons, Theone Murdoch, RPH      . atorvastatin (LIPITOR) tablet 80  mg  80 mg Oral q1800 Jennelle Human B, NP   80 mg at 12/23/16 1758  . carvedilol (COREG) tablet 3.125 mg  3.125 mg Oral BID WC Jennelle Human B, NP   3.125 mg at 12/24/16 0850  . gabapentin (NEURONTIN) capsule 200 mg  200 mg Oral QHS Regalado, Belkys A, MD   200 mg at 12/23/16 2227  . insulin aspart (novoLOG) injection 0-15 Units  0-15 Units Subcutaneous TID WC Regalado, Belkys A, MD      . metoprolol tartrate (LOPRESSOR) injection 5 mg  5 mg Intravenous Q6H PRN Omar Person, NP      . nitroGLYCERIN (NITROSTAT) SL tablet 0.4 mg  0.4 mg Sublingual Q5 min PRN Reubin Milan, MD      . ondansetron [facility])  tablet 4 mg  4 mg Oral Q6H PRN Reubin Milan, MD       Or  . ondansetron [facility]) injection 4 mg  4 mg Intravenous Q6H PRN Reubin Milan, MD      . sodium chloride flush (NS) 0.9 % injection 10-40 mL  10-40 mL Intracatheter Q12H Oswald Hillock, MD   10 mL at 12/24/16 0853  . sodium chloride flush (NS) 0.9 % injection 10-40 mL  10-40 mL Intracatheter PRN Oswald Hillock, MD   10 mL at 12/24/16 0756     Discharge Medications: Please see discharge summary for a list of discharge medications.  Relevant Imaging Results:  Relevant Lab Results:   Additional Information SS#: [id number]  Jorge Ny, LCSW

## 2016-12-24 NOTE — Progress Notes (Signed)
CSW spoke with pt concerning plan for time of DC- pt states that his niece can't take him in at this time because she does not have room at this time- per pt niece plans to move to bigger house in 6 months and will try to have him move in with her at that time.  CSW initiated LTC bed search for alternative facilities  Darrell Black, [place] Social Worker [phone]

## 2016-12-24 NOTE — Progress Notes (Signed)
Pt refusing to wear BIPAP. Nurse will continue to monitor o2 stat. Isac Caddy, RN

## 2016-12-24 NOTE — Progress Notes (Signed)
Pt was anxious and a little aggressive early this evening.  He would continuously say he needed his Bumex medicine and complain of a headache that will not go away.  The on call physician was informed and ordered a one time dose of IV ativan 1 mg and started IV Toradol 30 mg every 6 hours. After receiving the ativan at 2232 and the Toradol at [place], the pt started to relax and fell asleep around 0100.  The pt did become a little confused after the ativan but was calmer.  Pt currently resting in chair with the chair alarm on and the call bell in reach.  Lupita Dawn, RN

## 2016-12-24 NOTE — Progress Notes (Signed)
ANTICOAGULATION CONSULT NOTE - Initial Consult  Pharmacy Consult for Apixaban Indication: atrial fibrillation  Allergies  Allergen Reactions  . Ampicillin Rash  . Clindamycin/Lincomycin Rash  . Penicillins Rash    Has patient had a PCN reaction causing immediate rash, facial/tongue/throat swelling, SOB or lightheadedness with hypotension: Yes Has patient had a PCN reaction causing severe rash involving mucus membranes or skin necrosis: No Has patient had a PCN reaction that required hospitalization: No Has patient had a PCN reaction occurring within the last 10 years: No If all of the above answers are "NO", then may proceed with Cephalosporin use.     Patient Measurements: Height: 5\' 8"  (172.7 cm) Weight: (!) 315 lb 12.8 oz (143.2 kg) IBW/kg (Calculated) : 68.4  Vital Signs: Temp: 98.5 F (36.9 C) (10/15 0858) Temp Source: Oral (10/15 0858) BP: 115/67 (10/15 0858) Pulse Rate: 70 (10/15 0858)  Labs:  Recent Labs  12/22/16 1230 12/23/16 0349 12/24/16 1110  HGB 10.2*  --  10.0*  HCT 34.5*  --  33.1*  PLT 236  --  266  CREATININE 1.55* 1.78* 2.24*    Estimated Creatinine Clearance: 48.2 mL/min (A) (by C-G formula based on SCr of 2.24 mg/dL (H)).   Medical History: Past Medical History:  Diagnosis Date  . Atherosclerosis   . Atrial fibrillation ([place])   . 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 Chest pain   . Chronic combined systolic and diastolic heart failure ([place])   . Chronic pain   . Coronary artery disease   . 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 Hyperlipidemia   . Hypertension    essential  . Ischemic cardiomyopathy   . Major depressive disorder, recurrent ([place])   . Morbid obesity due to excess calories ([place])   . Muscle weakness (generalized)   . Neuropathy   . Obstructive sleep apnea   . Presence of automatic cardioverter/defibrillator (AICD)   . Renal disorder    Chronic kidney disease  . Restless  leg syndrome   . Sick-euthyroid syndrome     Medications:  Prescriptions Prior to Admission  Medication Sig Dispense Refill Last Dose  . acetaminophen (TYLENOL) 325 MG tablet Take 650 mg by mouth every 6 (six) hours as needed.   unknown  . amiodarone (PACERONE) 200 MG tablet Take 200 mg by mouth daily.   12/10/2016 at Unknown time  . apixaban (ELIQUIS) 5 MG TABS tablet Take 5 mg by mouth 2 (two) times daily.   12/10/2016 at 1700  . aspirin EC 81 MG tablet Take 81 mg by mouth daily.   12/10/2016 at Unknown time  . atorvastatin (LIPITOR) 80 MG tablet Take 80 mg by mouth every evening.   12/10/2016 at Unknown time  . bumetanide (BUMEX) 2 MG tablet Take 2 mg by mouth 2 (two) times daily. *May take one additional tablet every 12 hours as needed for increase in edema/shortness of breath   12/10/2016 at Unknown time  . carvedilol (COREG) 3.125 MG tablet Take 3.125 mg by mouth 2 (two) times daily with a meal.   12/10/2016 at 1700  . docusate sodium (COLACE) 100 MG capsule Take 100 mg by mouth daily.   12/10/2016 at Unknown time  . DULoxetine (CYMBALTA) 30 MG capsule Take 30 mg by mouth every evening.   12/10/2016 at Unknown time  . gabapentin (NEURONTIN) 100 MG capsule Take 100 mg by mouth 3 (three) times daily.   12/10/2016 at 2100  .  guaifenesin (ROBITUSSIN) 100 MG/5ML syrup Take 200 mg by mouth 3 (three) times daily as needed for cough or congestion.   unknown  . isosorbide mononitrate (IMDUR) 60 MG 24 hr tablet Take 60 mg by mouth daily.   12/10/2016 at Unknown time  . magnesium oxide (MAG-OX) 400 MG tablet Take 400 mg by mouth daily.   12/10/2016 at Unknown time  . nitroGLYCERIN (NITROSTAT) 0.4 MG SL tablet Place 0.4 mg under the tongue every 5 (five) minutes as needed for chest pain.   unknown  . oxycodone (OXY-IR) 5 MG capsule Take 5 mg by mouth every 4 (four) hours as needed for pain.   unknown  . rOPINIRole (REQUIP) 1 MG tablet Take 1 mg by mouth at bedtime.   12/10/2016 at Unknown time  . senna  (SENOKOT) 8.6 MG TABS tablet Take 1 tablet by mouth at bedtime.   12/10/2016 at Unknown time  . spironolactone (ALDACTONE) 12.5 mg TABS tablet Take 12.5 mg by mouth daily.   12/10/2016 at Unknown time  . sulfamethoxazole-trimethoprim (BACTRIM DS,SEPTRA DS) 800-160 MG tablet Take 1 tablet by mouth 2 (two) times daily. Starting on 12/08/2016-to be completed on 12/17/2016   12/10/2016 at Unknown time  . tiotropium (SPIRIVA) 18 MCG inhalation capsule Place 18 mcg into inhaler and inhale daily.   12/10/2016 at Unknown time    Assessment: 61 yo M on Eliquis PTA admitted with GI bleeding.  Anticoagulation was held around the time of GI eval.  Asked by cardiology to restart today.  Of note, SCr up to 2.24 from baseline of ~ 1.2.  Will monitor.  Goal of Therapy:  therapeutic anticoagulation Monitor platelets by anticoagulation protocol: Yes   Plan:  Apixaban 5mg  po BID Follow renal function, s/s bleeding  [employer], Pharm.D., BCPS Clinical Pharmacist Pager: [phone] Clinical phone for 12/24/2016 from 8:30-4:00 is [phone]. After 4pm, please call Main Rx [phone]) for assistance. 12/24/2016 12:26 PM

## 2016-12-24 NOTE — Progress Notes (Addendum)
PROGRESS NOTE    Darrell Black  NFA:[id number] DOB: 1955/04/20 DOA: 12/10/2016 PCP: Renata Caprice, DO   Brief Narrative: 61 year old male with past medical history of atrial fibrillation, chronic systolic/diastolic heart failure, diabetes mellitus, morbid obesity and obstructive sleep apnea brought to the emergency room on 9/30 for visual hallucinations 4 days. Initially, patient waiting for transfer to psychiatric facility but over the next 24 hours, developed worsening dyspnea and sedation, possibly from her psychotropic medications. He started to become hypoxic and felt to be in volume overload. Admitted to the hospitalist service at [facility] and placed in stepdown on 10/3.  Patient started on IV diuretics and initially responded well, diuresing over 4 L. Followed by tele-psychiatry.  Following hospitalization, patient started having some mild bleeding from his gums, treated with tranexemic acid and his eliquis was held. He continued to remain hypoxic, requiring BiPAP.  On night of 10/5, patient started having some gross blood per rectum. Remained hemodynamically stable, but hemoglobin dropped from 9.52 days prior to 8.8 by next morning. No hematemesis. PICC line placed 10/6. Patient remained unresponsive on BiPAP. Seen by GI and it was recommended he be transferred to critical care at [facility]. Patient's lack of responsiveness was felt to be secondary to acute psychosis plus hypercapnia plus polypharmacy. By holding sedating medications, patient became more awake by 10/8. Felt to be in acute psychosis and psychiatry consulted. Cardiology also consulted for acute on chronic heart failure.  Transfer back to hospitalist service on 10/9 morning. Cardiology recommended continuing Lasix. Since admission, he is now 14 L diuresed. GI recommended holding eliquis and planning for colonoscopy if unable to get reports previous colonoscopy or rectal bleeding persisted. 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  Improving. Off BIPAP.  Check oxygen on ambulation.  He is requiring now 4 L of oxygen today, he is feeling very Short of breath. Labs pending for the morning. Will give one time dose of lasix.   Acute on chronic systolic congestive heart failure ([place]):  -Ejection fraction notes EF of 25-30 percent with atrial fibrillation and unable to ascertain diastolic dysfunction. -Cardiology following.  -negtaive 15 L.  Worsening SOB this morning, requiring more oxygen over last 48 hours. Diuretics on hold for last 24 hours. Labs pending for the morning, no significant urine out put in last 24 hours. Will give one time dose of lasix.  Encephalopathy;  He was notice to be more confuse overnight, he received one time dose of ativan. Avoid sedatives.  He is sleepy, fall asleep in between conversation. Still SOB.  Will check STAT ABG. Stat ABG, depending on results will need BIPAP, further lasix.   Dizziness, headaches;  He is complaining of dizziness and headaches.  Orthostatic negative.  CT  head negative.  Plan to get PT evaluation, to assess risk fall.   Major depression with Psychotic feature.  Clear by psych .  Needs to follow up outpatient for  medications management.  No antipsychotic medications at this time.  Hold Requip which could cause psychosis.   GI bleed, recta bleeding.  Underwent colonoscopy, showed hemorrhoids, polyp, S/P polypectomy.  Hb stable.   Cellulitis; lower extremities;  On Ancef. Day 5. Doxy change to bactrim due to itchiness.  Received 3 days of bactrim. Total 8 days antibiotics.   Acute renal failure/acute kidney injury; On admission he had renal failure, Secondary to poor perfusion. Creatinine has been steadily improving. Peaked his high as 2.2. Down to 1.36,  Renal function started to get worse again on 10-13: 1.5---1.7----2 today.  Diuretics has been on hold since 10-13 afternoon. He received Toradol overnight, will discontinue Bactrim.  Check bladder scan.  Check UA, renal US>  Gave one time dose of lasix this am, because patient was having SOB.   Afib; holding anticoagulation due to GI bleed.  Hold apixiban due to recent fall, PT evaluation. Also he was having dizziness 10-14. Needs PT evaluation. Now cr at 2.  Cardiology to to give further recommendation.   Restless leg syndrome: Per Psych  Requip could cause psychosis, plan to hold that medication for now.  Started Gabapentin low dose.   Diabetes mellitus without complication ([place]):? Maybe erroneous diagnosis. A1c at 5.4 and patient not on home medications.  Morbid obesity due to excess calories [facility]) patient meets criteria with BMI greater than 40.  Hypernatremia; monitor on lasix. Resolved.   Hypokalemia; oral potassium supplements.  Fall; CT head negative.  Elevated TSH; free t 3 and Free  t 4 normal.   DVT prophylaxis: SCD. No anticoagulation due to GI bleed  Code Status: full code.  Family Communication: care discussed with patient.  Disposition Plan: home when stable.    Consultants:   GI  Cardiology    Procedures:  ECHO; Procedure narrative: Contrast enhancement employed. - Left ventricle: The cavity size was normal.  Wall thickness was   increased in a pattern of mild LVH. Systolic function was   moderately reduced. The estimated ejection fraction was in the   range of 35% to 40%. Diffuse hypokinesis. The study was not   technically sufficient to allow evaluation of LV diastolic   dysfunction due to atrial fibrillation. - Regional wall motion abnormality: Akinesis of the apical septal   and apical myocardium; moderate hypokinesis of the basal-mid    anteroseptal myocardium.     Antimicrobials:   Ancef   Subjective: He is feeling dizzy, complaining of headaches.     Objective: Vitals:   12/23/16 2032 12/24/16 0015 12/24/16 0439 12/24/16 0858  BP: 140/74 108/60 107/87 115/67  Pulse: 76  70 70  Resp: 18 18 (!) 21 18  Temp: 98.1 F (36.7 C) 98.5 F (36.9 C) 98.3 F (36.8 C) 98.5 F (36.9 C)  TempSrc: Oral Oral Oral Oral  SpO2: 98% 97% 100% 100%  Weight:      Height:        Intake/Output Summary (Last 24 hours) at 12/24/16 1304 Last data filed at 12/24/16 1245  Gross per 24 hour  Intake              842 ml  Output              575 ml  Net  267 ml   Filed Weights   12/17/16 1300 12/19/16 0358 12/20/16 0541  Weight: (!) 140.2 kg (309 lb) (!) 143.5 kg (316 lb 6.4 oz) (!) 143.2 kg (315 lb 12.8 oz)    Examination:  General exam: sleepy, mild distress.  Respiratory system: bilateral crackles.  Cardiovascular system: S 1, S 2  Gastrointestinal system: BS present, soft, nt Central nervous system: non focal.  Extremities: bilateral edema, and redness.       Data Reviewed: I have personally reviewed following labs and imaging studies  CBC:  Recent Labs Lab 12/19/16 0434 12/21/16 0428 12/22/16 1230 12/24/16 1110  WBC 6.5 4.7 7.3 7.5  HGB 10.4* 10.0* 10.2* 10.0*  HCT 35.8* 33.3* 34.5* 33.1*  MCV 96.2 96.8 95.6 96.2  PLT 234 194 236 732   Basic Metabolic Panel:  Recent Labs Lab 12/19/16 0434 12/21/16 0428 12/22/16 1230 12/23/16 0349 12/24/16 1110   NA 138 139 134* 136 134*  K 3.4* 3.8 4.2 4.5 4.8  CL 84* 89* 88* 91* 89*  CO2 43* 42* 39* 39* 36*  GLUCOSE 107* 113* 132* 123* 104*  BUN 19 17 19  22* 31*  CREATININE 1.28* 1.25* 1.55* 1.78* 2.24*  CALCIUM 8.3* 8.4* 8.2* 8.4* 8.4*   GFR: Estimated Creatinine Clearance: 48.2 mL/min (A) (by C-G formula based on SCr of 2.24 mg/dL (H)). Liver Function Tests:  Recent Labs Lab 12/19/16 0434  AST 48*  ALT 27  ALKPHOS 141*  BILITOT 1.4*  PROT 6.6  ALBUMIN 3.0*   No results for input(s): LIPASE, AMYLASE in the last 168 hours. No results for input(s): AMMONIA in the last 168 hours. Coagulation Profile: No results for input(s): INR, PROTIME in the last 168 hours. Cardiac Enzymes: No results for input(s): CKTOTAL, CKMB, CKMBINDEX, TROPONINI in the last 168 hours. BNP (last 3 results) No results for input(s): PROBNP in the last 8760 hours. HbA1C: No results for input(s): HGBA1C in the last 72 hours. CBG:  Recent Labs Lab 12/22/16 1631 12/22/16 2142 12/23/16 1141 12/24/16 0638 12/24/16 1123  GLUCAP 117* 147* 100* 96 94   Lipid Profile: No results for input(s): CHOL, HDL, LDLCALC, TRIG, CHOLHDL, LDLDIRECT in the last 72 hours. Thyroid Function Tests:  Recent Labs  12/22/16 1230  FREET4 1.01  T3FREE 2.4   Anemia Panel: No results for input(s): VITAMINB12, FOLATE, FERRITIN, TIBC, IRON, RETICCTPCT in the last 72 hours. Sepsis Labs: No results for input(s): PROCALCITON, LATICACIDVEN in the last 168 hours.  No results found for this or any previous visit (from the past 240 hour(s)).       Radiology Studies: Ct Head Wo Contrast  Result Date: 12/23/2016 CLINICAL DATA:  61 year old male status post fall. Headache. A fib. EXAM: CT HEAD WITHOUT CONTRAST TECHNIQUE: Contiguous axial images were obtained from the base of the skull through the vertex without intravenous contrast. COMPARISON:  11/22/2016 FINDINGS: Brain: Cerebral volume is normal for age. No midline shift,  ventriculomegaly, mass effect, evidence of mass lesion, intracranial hemorrhage or evidence of cortically based acute infarction. Gray-white matter differentiation is within normal limits throughout the brain. Vascular: Calcified atherosclerosis at the skull base. No suspicious intracranial vascular hyperdensity. Skull: No acute osseous abnormality identified. Sinuses/Orbits: Visualized paranasal sinuses and mastoids are stable and well pneumatized. Other: Decreased scalp soft tissue stranding and swelling posteriorly near midline (series 4, image 52. Otherwise visible orbits and scalp soft tissues are within normal limits. IMPRESSION: 1. Stable and normal for age noncontrast CT appearance of the brain. 2. Mild regression of  left posterior scalp contusion. Electronically Signed   By: Genevie Ann M.D.   On: 12/23/2016 15:43        Scheduled Meds: . amiodarone  200 mg Oral Daily  . apixaban  5 mg Oral BID  . atorvastatin  80 mg Oral q1800  . carvedilol  3.125 mg Oral BID WC  . gabapentin  200 mg Oral QHS  . insulin aspart  0-15 Units Subcutaneous TID WC  . sodium chloride flush  10-40 mL Intracatheter Q12H  . sulfamethoxazole-trimethoprim  1 tablet Oral Q12H   Continuous Infusions:    LOS: 12 days    Time spent: 35 minutes.     Elmarie Shiley, MD [facility] Pager [phone]  If 7PM-7AM, please contact night-coverage [email or web address] Password [email or web address] 12/24/2016, 1:04 PM

## 2016-12-24 NOTE — Progress Notes (Signed)
RT attempted to place patient on BIPAP for the night. Patient is refusing to wear it at this time. RN aware.

## 2016-12-24 NOTE — Consult Note (Signed)
Name: Darrell Black MRN: [id number] DOB: [birth year]    ADMISSION DATE:  12/10/2016 CONSULTATION DATE:  12/24/2016  REFERRING MD :  Dr. Tyrell Antonio  CHIEF COMPLAINT:  Hypercarbic respiratory failure  HISTORY OF PRESENT ILLNESS:   61 y.o.malewith a history of obstructive sleep apnea on cpap, morbid obesity, atherosclerosis, atrial fibrillation, chronic combined systolic and diastolic heart failure, chronic pain, coronary artery disease, ischemic cardiomyopathy, type 2 diabetes, hyperlipidemia, hypertension, depression, generalized muscle weakness, peripheral neuropathy, chronic kidney disease, restless leg syndrome, and sick euthyroid syndrome admitted on 10/1 with hallucinations and acute psychosis.  Became progressively short of breath requiring BiPAP.  His encephalopathy thought multifactorial given his CKD, multiple psychotropic meds, possible HCAP, and acute on chronic hypercapnia.   Transferred from [facility] [address] on to ICU on 10/6 due to lower GI bleeding on eliquis.  He was diuresed and on 10/6 came off BiPAP to 2L [place] and transferred to stepdown on 10/9 and remained on [place] only.  Cleared by psychiatry on 10/9 with recommendations not to continue pysch meds and followup outpatient.  Underwent colonscopy on 10/11 by GI found to have one polyp in sigmoid colon removed and non-bleeding internal hemorrhoids.  Lasix held on 10/15 due to increased sCr.  Currently treated with bactrim for BLE cellulitis.  On 10/15,  PCCM re-consulted for worsening SOB and somnolence.  ABG 7.3/75.8/113.  Patient placed back on BiPAP.   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  has a past surgical history that includes Colonoscopy (N/A, 12/20/2016). Prior to Admission medications   Medication Sig Start Date End Date Taking? Authorizing Provider  acetaminophen (TYLENOL) 325 MG tablet Take 650 mg by mouth every 6 (six) hours as needed.   Yes [provider]  amiodarone (PACERONE) 200 MG tablet Take 200 mg by mouth daily.   Yes [provider]  apixaban (ELIQUIS) 5 MG TABS tablet Take 5 mg by mouth 2 (two) times daily.   Yes [provider]  aspirin EC 81 MG tablet Take 81 mg by mouth daily.   Yes [provider]  atorvastatin (LIPITOR) 80 MG tablet Take 80 mg by mouth every evening.   Yes [provider]  bumetanide (BUMEX) 2 MG tablet Take 2 mg by mouth 2 (two) times daily. *May take one additional tablet every 12 hours as needed for increase in edema/shortness of breath   Yes [provider]  carvedilol (COREG) 3.125 MG tablet Take 3.125 mg by mouth 2 (two) times daily with a meal.   Yes [provider]  docusate sodium (COLACE) 100 MG capsule Take 100 mg by mouth daily.   Yes [provider]  DULoxetine (CYMBALTA) 30 MG capsule Take 30 mg by mouth every evening.   Yes [provider]  gabapentin (NEURONTIN) 100 MG capsule Take 100 mg by mouth 3 (three) times daily.   Yes [provider]  guaifenesin (ROBITUSSIN) 100 MG/5ML syrup Take 200 mg by mouth 3 (three) times daily as needed for cough or congestion.   Yes [provider]  isosorbide mononitrate (IMDUR) 60 MG  24 hr tablet Take 60 mg by mouth daily.   Yes [provider]  magnesium oxide (MAG-OX) 400 MG tablet Take 400 mg by mouth daily.   Yes [provider]  nitroGLYCERIN (NITROSTAT) 0.4 MG SL tablet Place 0.4 mg under the tongue every 5 (five) minutes as needed for chest pain.   Yes [provider]  oxycodone (OXY-IR) 5 MG capsule Take 5 mg by mouth every 4 (four) hours as needed for pain.   Yes [provider]  rOPINIRole (REQUIP) 1 MG tablet Take 1 mg by mouth at bedtime.   Yes [provider]  senna (SENOKOT) 8.6 MG TABS tablet Take 1 tablet by mouth at bedtime.   Yes [provider]  spironolactone (ALDACTONE) 12.5 mg TABS tablet Take 12.5 mg by mouth daily.   Yes [provider]  sulfamethoxazole-trimethoprim (BACTRIM DS,SEPTRA DS) 800-160 MG tablet Take 1 tablet by mouth 2 (two) times daily. Starting on 12/08/2016-to be completed on 12/17/2016   Yes [provider]  tiotropium (SPIRIVA) 18 MCG inhalation capsule Place 18 mcg into inhaler and inhale daily.   Yes [provider]   Allergies  Allergen Reactions  . Ampicillin Rash  . Clindamycin/Lincomycin Rash  . Penicillins Rash    Has patient had a PCN reaction causing immediate rash, facial/tongue/throat swelling, SOB or lightheadedness with hypotension: Yes Has patient had a PCN reaction causing severe rash involving mucus membranes or skin necrosis: No Has patient had a PCN reaction that required hospitalization: No Has patient had a PCN reaction occurring within the last 10 years: No If all of the above answers are "NO", then may proceed with Cephalosporin use.     FAMILY HISTORY:  family history includes Hypertension in his father. SOCIAL HISTORY:  reports that he has never smoked. He has never used smokeless tobacco. He reports that he does not drink alcohol or use drugs.  REVIEW OF SYSTEMS:   Constitutional: Negative for fever, chills, weight loss, malaise/fatigue and diaphoresis.  HENT: Negative for hearing loss, ear pain, nosebleeds, congestion, sore throat, neck pain, tinnitus and ear discharge.     Eyes: Negative for blurred vision, double vision, photophobia, pain, discharge and redness.  Respiratory: Negative for cough, hemoptysis, sputum production, shortness of breath, wheezing and stridor.  somnolence  Cardiovascular: Negative for chest pain, palpitations, orthopnea, claudication, leg swelling and PND.  Gastrointestinal: Negative for heartburn, nausea, vomiting, abdominal pain, diarrhea, constipation, blood in stool and melena.  Genitourinary: Negative for dysuria, urgency, frequency, hematuria and flank pain.  Musculoskeletal: Negative for myalgias, back pain, joint pain and falls.  Skin: Negative for itching and rash.  Neurological: Negative for dizziness, tingling, tremors, sensory change, speech change, focal weakness, seizures, loss of consciousness, weakness and headaches.  Endo/Heme/Allergies: Negative for environmental allergies and polydipsia. Does not bruise/bleed easily.  SUBJECTIVE:  Patient without complaints of pain or SOB. Repeat ABG pending  VITAL SIGNS: Temp:  [97.9 F (36.6 C)-98.5 F (36.9 C)] 97.9 F (36.6 C) (10/15 1412) Pulse Rate:  [70-78] 70 (10/15 1523) Resp:  [18-23] 19 (10/15 1523) BP: (99-140)/(60-87) 99/69 (10/15 1412) SpO2:  [96 %-100 %] 96 % (10/15 1523)  PHYSICAL EXAMINATION: General:  Chronically ill appearing male lying in bed in NAD HEENT:  PERRL, well fitting BiPAP mask Neuro:  Sleepy, but awakens to voice, answers questions appropiately CV: s1s2 rrr PULM: even/non-labored, lungs bilaterally clear anteriorly, diminished in bases GI: obese, soft, non-tender, bs active  Extremities: warm/dry, BLE lower leg discoloration, trace edema  Skin: no rashes    Recent Labs Lab 12/22/16 1230 12/23/16 0349 12/24/16 1110  NA 134* 136 134*  K 4.2 4.5 4.8  CL 88* 91* 89*  CO2 39* 39* 36*  BUN 19 22* 31*  CREATININE 1.55* 1.78* 2.24*  GLUCOSE 132* 123* 104*    Recent Labs Lab 12/21/16 0428 12/22/16 1230 12/24/16 1110  HGB 10.0*  10.2* 10.0*  HCT 33.3* 34.5* 33.1*  WBC 4.7 7.3 7.5  PLT 194 236 266   Ct Head Wo Contrast  Result Date: 12/23/2016 CLINICAL DATA:  61 year old male status post fall. Headache. A fib. EXAM: CT HEAD WITHOUT CONTRAST TECHNIQUE: Contiguous axial images were obtained from the base of the skull through the vertex without intravenous contrast. COMPARISON:  11/22/2016 FINDINGS: Brain: Cerebral volume is normal for age. No midline shift, ventriculomegaly, mass effect, evidence of mass lesion, intracranial hemorrhage or evidence of cortically based acute infarction. Gray-white matter differentiation is within normal limits throughout the brain. Vascular: Calcified atherosclerosis at the skull base. No suspicious intracranial vascular hyperdensity. Skull: No acute osseous abnormality identified. Sinuses/Orbits: Visualized paranasal sinuses and mastoids are stable and well pneumatized. Other: Decreased scalp soft tissue stranding and swelling posteriorly near midline (series 4, image 52. Otherwise visible orbits and scalp soft tissues are within normal limits. IMPRESSION: 1. Stable and normal for age noncontrast CT appearance of the brain. 2. Mild regression of left posterior scalp contusion. Electronically Signed   By: Genevie Ann M.D.   On: 12/23/2016 15:43   Dg Chest Port 1 View  Result Date: 12/24/2016 CLINICAL DATA:  Dyspnea EXAM: PORTABLE CHEST 1 VIEW COMPARISON:  Chest radiograph 12/17/2016 FINDINGS: Left chest wall AICD leads are unchanged in position. Sequelae of median sternotomy. Unchanged cardiomegaly. Small right pleural effusion with associated atelectasis, unchanged. No new region of consolidation. Unchanged left basilar atelectasis. Persistent pulmonary vascular congestion. IMPRESSION: Unchanged small right pleural effusion and associated atelectasis. Pulmonary vascular congestion and left basilar atelectasis without overt pulmonary edema. Electronically Signed   By: Ulyses Jarred M.D.   On: 12/24/2016  14:52    ASSESSMENT / PLAN:  Acute on chronic hypercarbic/ hypoxic respiratory failure OSA- on cpap prior to admission, unkown settings though Morbid obesity - last on BiPAP 10/6, not on CPAP since admission - last received ativan 1mg   10/14 around 2200 - currently negative -15 L - afebrile, normal WBC  P:  ABG noted, however, patient able to wake up and follows commands No need for intubation at this point, protecting airway Continue BiPAP PRN while sleeping and mandatory HS Trend CXR Holding lasix given renal function, and agree with d/c other renal toxic meds (toradol/bactrim) Continue BiPAP PRN while sleeping and mandatory HS Avoid sedating meds Continue pulmonary hygiene and mobilize with PT Check PCT and lactic to rule out other etiologies of respiratory failure, CXR 10/15 grossly unchanged from 10/8   Remainder per primary team  Kennieth Rad, AGACNP-BC [place] Pgr: [phone] or if no answer [phone] 12/24/2016, 5:05 PM

## 2016-12-24 NOTE — Progress Notes (Addendum)
Progress Note  Patient Name: Darrell Black Date of Encounter: 12/24/2016  Primary Cardiologist: Vernon Prey Pasi at [facility] here  Subjective   Pt complaining of neck pain.  Inpatient Medications    Scheduled Meds: . amiodarone  200 mg Oral Daily  . atorvastatin  80 mg Oral q1800  . carvedilol  3.125 mg Oral BID WC  . gabapentin  200 mg Oral QHS  . insulin aspart  0-15 Units Subcutaneous TID WC  . sodium chloride flush  10-40 mL Intracatheter Q12H  . sulfamethoxazole-trimethoprim  1 tablet Oral Q12H   Continuous Infusions:  PRN Meds: acetaminophen, albuterol, metoprolol tartrate, nitroGLYCERIN, ondansetron **OR** ondansetron (ZOFRAN) IV, sodium chloride flush   Vital Signs    Vitals:   12/23/16 2032 12/24/16 0015 12/24/16 0439 12/24/16 0858  BP: 140/74 108/60 107/87 115/67  Pulse: 76  70 70  Resp: 18 18 (!) 21 18  Temp: 98.1 F (36.7 C) 98.5 F (36.9 C) 98.3 F (36.8 C) 98.5 F (36.9 C)  TempSrc: Oral Oral Oral Oral  SpO2: 98% 97% 100% 100%  Weight:      Height:        Intake/Output Summary (Last 24 hours) at 12/24/16 1115 Last data filed at 12/24/16 0756  Gross per 24 hour  Intake              602 ml  Output              275 ml  Net              327 ml   Filed Weights   12/17/16 1300 12/19/16 0358 12/20/16 0541  Weight: (!) 309 lb (140.2 kg) (!) 316 lb 6.4 oz (143.5 kg) (!) 315 lb 12.8 oz (143.2 kg)     Physical Exam   General: Well developed, well nourished, male appearing in no acute distress. Head: Normocephalic, atraumatic.  Neck: Supple without bruits, no JVD Lungs:  Resp regular and unlabored, CTA. Heart: Irregular rhythm, regular rate, no murmur; no rub. Abdomen: Soft, non-tender, non-distended with normoactive bowel sounds. No hepatomegaly. No rebound/guarding. No obvious abdominal masses. Extremities: No clubbing, cyanosis, trace to 1+ edema. Distal pedal pulses are faint bilaterally. Neuro: Alert and oriented X 3. Moves all  extremities spontaneously. Psych: Normal affect.  Labs    Chemistry Recent Labs Lab 12/19/16 0434 12/21/16 0428 12/22/16 1230 12/23/16 0349  NA 138 139 134* 136  K 3.4* 3.8 4.2 4.5  CL 84* 89* 88* 91*  CO2 43* 42* 39* 39*  GLUCOSE 107* 113* 132* 123*  BUN 19 17 19  22*  CREATININE 1.28* 1.25* 1.55* 1.78*  CALCIUM 8.3* 8.4* 8.2* 8.4*  PROT 6.6  --   --   --   ALBUMIN 3.0*  --   --   --   AST 48*  --   --   --   ALT 27  --   --   --   ALKPHOS 141*  --   --   --   BILITOT 1.4*  --   --   --   GFRNONAA 59* >60 47* 39*  GFRAA >60 >60 54* 46*  ANIONGAP 11 8 7 6      Hematology Recent Labs Lab 12/19/16 0434 12/21/16 0428 12/22/16 1230  WBC 6.5 4.7 7.3  RBC 3.72* 3.44* 3.61*  HGB 10.4* 10.0* 10.2*  HCT 35.8* 33.3* 34.5*  MCV 96.2 96.8 95.6  MCH 28.0 29.1 28.3  MCHC 29.1* 30.0 29.6*  RDW 15.5  15.7* 15.5  PLT 234 194 236    Cardiac EnzymesNo results for input(s): TROPONINI in the last 168 hours. No results for input(s): TROPIPOC in the last 168 hours.   BNPNo results for input(s): BNP, PROBNP in the last 168 hours.   DDimer No results for input(s): DDIMER in the last 168 hours.   Radiology    Ct Head Wo Contrast  Result Date: 12/23/2016 CLINICAL DATA:  61 year old male status post fall. Headache. A fib. EXAM: CT HEAD WITHOUT CONTRAST TECHNIQUE: Contiguous axial images were obtained from the base of the skull through the vertex without intravenous contrast. COMPARISON:  11/22/2016 FINDINGS: Brain: Cerebral volume is normal for age. No midline shift, ventriculomegaly, mass effect, evidence of mass lesion, intracranial hemorrhage or evidence of cortically based acute infarction. Gray-white matter differentiation is within normal limits throughout the brain. Vascular: Calcified atherosclerosis at the skull base. No suspicious intracranial vascular hyperdensity. Skull: No acute osseous abnormality identified. Sinuses/Orbits: Visualized paranasal sinuses and mastoids are  stable and well pneumatized. Other: Decreased scalp soft tissue stranding and swelling posteriorly near midline (series 4, image 52. Otherwise visible orbits and scalp soft tissues are within normal limits. IMPRESSION: 1. Stable and normal for age noncontrast CT appearance of the brain. 2. Mild regression of left posterior scalp contusion. Electronically Signed   By: Genevie Ann M.D.   On: 12/23/2016 15:43     Telemetry    Rate-controlled Afib - Personally Reviewed  ECG    No new tracings - Personally Reviewed   Cardiac Studies   Echocardiogram 12/12/16: Study Conclusions - Procedure narrative: Contrast enhancement employed. - Left ventricle: The cavity size was normal. Wall thickness was   increased in a pattern of mild LVH. Systolic function was   moderately reduced. The estimated ejection fraction was in the   range of 35% to 40%. Diffuse hypokinesis. The study was not   technically sufficient to allow evaluation of LV diastolic   dysfunction due to atrial fibrillation. - Regional wall motion abnormality: Akinesis of the apical septal   and apical myocardium; moderate hypokinesis of the basal-mid   anteroseptal myocardium. - Aortic valve: Mildly calcified leaflets. - Mitral valve: Moderately calcified annulus. Normal thickness   leaflets . - Left atrium: The atrium was mildly dilated. - Right ventricle: Pacer wire or catheter noted in right ventricle.   Systolic function was mildly reduced. - Tricuspid valve: There was mild regurgitation. - Pulmonary arteries: PA peak pressure: 38 mm Hg (S). - Systemic veins: IVC is dilated wtih normal respiratory variation.   Estimated CVP 8 mmHg.   Patient Profile     61 y.o. male with a history of atrial fibrillation and acute on chronic systolic and diastolic congestive heart failure. He's had recent GI bleeding. Colonoscopy 12/20/16 revealed a 12 mm polyp which was removed. He was also found to have hemorrhoids that were not bleeding. The  gastroenterology team has given the okay to restart Eliquis, per cardiology notes on 12/21/16 (not yet started).  Assessment & Plan    1. Acute on chronic systolic heart failure - overall net neative  - LVEF 25-30% in the setting of Afib - received 40 mg IV lasix this morning, no other planned doses - diuretic is now held for increasing sCr: 1.78 (1.55) - not yet on ARB   2. Afib - on amiodarone and coreg - Hb 10.2 yesterday - restarted eliquis   Signed, Ledora Bottcher , PA-C 11:15 AM 12/24/2016 Pager: [phone]  Personally seen and  examined. Agree with above.  Acute on chronic systolic HF with AFIB  Holding lasix because of creat rise.  Restarted Eliquis. OK with GI.  Hg stable.  AFIB on tele, rate controlled EF 35%  Irreg irreg, CTAB, alert, obese, +edema LE  Candee Furbish, MD

## 2016-12-25 ENCOUNTER — Inpatient Hospital Stay (HOSPITAL_COMMUNITY): Payer: Medicaid Other

## 2016-12-25 LAB — BASIC METABOLIC PANEL
Anion gap: 10 (ref 5–15)
BUN: 37 mg/dL — AB (ref 6–20)
CO2: 35 mmol/L — ABNORMAL HIGH (ref 22–32)
CREATININE: 3.03 mg/dL — AB (ref 0.61–1.24)
Calcium: 8.5 mg/dL — ABNORMAL LOW (ref 8.9–10.3)
Chloride: 89 mmol/L — ABNORMAL LOW (ref 101–111)
GFR calc Af Amer: 24 mL/min — ABNORMAL LOW (ref >60)
GFR calc non Af Amer: 21 mL/min — ABNORMAL LOW (ref >60)
GLUCOSE: 93 mg/dL (ref 65–99)
POTASSIUM: 5 mmol/L (ref 3.5–5.1)
SODIUM: 134 mmol/L — AB (ref 135–145)

## 2016-12-25 LAB — GLUCOSE, CAPILLARY: GLUCOSE-CAPILLARY: 122 mg/dL — AB (ref 65–99)

## 2016-12-25 LAB — CBC
HCT: 33.3 % — ABNORMAL LOW (ref 39.0–52.0)
Hemoglobin: 9.7 g/dL — ABNORMAL LOW (ref 13.0–17.0)
MCH: 28 pg (ref 26.0–34.0)
MCHC: 29.1 g/dL — AB (ref 30.0–36.0)
MCV: 96 fL (ref 78.0–100.0)
PLATELETS: 246 10*3/uL (ref 150–400)
RBC: 3.47 MIL/uL — AB (ref 4.22–5.81)
RDW: 15.4 % (ref 11.5–15.5)
WBC: 6.5 10*3/uL (ref 4.0–10.5)

## 2016-12-25 LAB — SODIUM, URINE, RANDOM: Sodium, Ur: <10 mmol/L

## 2016-12-25 LAB — CREATININE, URINE, RANDOM: Creatinine, Urine: 154.39 mg/dL

## 2016-12-25 LAB — PROCALCITONIN

## 2016-12-25 MED ORDER — BISACODYL 10 MG RE SUPP: 10.0000 mg | Freq: Once | RECTAL | Status: DC

## 2016-12-25 MED ORDER — DOCUSATE SODIUM 100 MG PO CAPS
100.0000 mg | ORAL_CAPSULE | Freq: Two times a day (BID) | ORAL | Status: DC
  Administered 2016-12-26 – 2016-12-30 (×7): 100 mg via ORAL
  Filled 2016-12-25 (×10): qty 1

## 2016-12-25 MED ORDER — SENNA 8.6 MG PO TABS
1.0000 | ORAL_TABLET | Freq: Two times a day (BID) | ORAL | Status: DC
  Administered 2016-12-26 – 2016-12-30 (×5): 8.6 mg via ORAL
  Filled 2016-12-25 (×10): qty 1

## 2016-12-25 NOTE — Consult Note (Signed)
Reason for Consult: AKI with CKD Referring Physician: Dr. Tyrell Antonio  HPI. Darrell Black is an 61 y.o. male. 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 Colonoscopy shows tubular adenoma and some nonbleeding hemorrhoids, hemoglobin stable, GI okay to restart anticoagulation which has not been started yet.  He was diuresed aggressively with -15 L. diuretics were held since yesterday because of worsening creatinine. He has creatinine around 2 on admission which gradually improved to 1.25 with dialysis on 12/21/2016, since 12/22/2016 creatinine gradually getting worse, diuretic was held on 12/24/2016, today's creatinine was 3.03. Nephrology was consulted because of his worsening renal function.  According to patient he experiencing intermittent worsening of his creatinine which is most likely related to his volume status. According to him he was admitted in June 2016 at [facility] in [facility] and got hemodialysis because of acute renal failure. He used to follow up with nephrologist over there, then moved to [place] before moving to [facility]. He recently moved to [place] from [place] due to [place]. No documented record found. He does not follow-up with any nephrology since he moved out of [place].  Patient continued to experience shortness of breath with mild exertion, saturating well on his home oxygen which was 2-4 L. at home he was using Lasix between 20-60 mg depending on his lower extremity edema. He was not making much urine since yesterday after his diuretic was held. Patient was very somnolent yesterday, apparently he was not getting his BiPAP at night, and somnolence was more consistent with has severe  obstructive sleep apnea. He did get his BiPAP last night and slept well, feeling much better and more alert today. He also has a history of chronic hypercapnia most likely  secondary to sleep apnea, he is not using any CPAP on BiPAP at home and is not aware of any sleep study. He also continued to have lower extremity swelling worse on left.   Trend in Creatinine: Creatinine, Ser  Date/Time Value Ref Range Status  12/25/2016 04:01 AM 3.03 (H) 0.61 - 1.24 mg/dL Final  12/24/2016 11:10 AM 2.24 (H) 0.61 - 1.24 mg/dL Final  12/23/2016 03:49 AM 1.78 (H) 0.61 - 1.24 mg/dL Final  12/22/2016 12:30 PM 1.55 (H) 0.61 - 1.24 mg/dL Final  12/21/2016 04:28 AM 1.25 (H) 0.61 - 1.24 mg/dL Final  12/19/2016 04:34 AM 1.28 (H) 0.61 - 1.24 mg/dL Final  12/17/2016 04:30 AM 1.36 (H) 0.61 - 1.24 mg/dL Final  12/16/2016 02:48 AM 1.61 (H) 0.61 - 1.24 mg/dL Final  12/15/2016 08:39 PM 1.79 (H) 0.61 - 1.24 mg/dL Final  12/15/2016 04:49 AM 2.20 (H) 0.61 - 1.24 mg/dL Final  12/14/2016 03:55 AM 1.95 (H) 0.61 - 1.24 mg/dL Final  12/13/2016 03:58 AM 1.98 (H) 0.61 - 1.24 mg/dL Final  12/12/2016 08:09 AM 1.90 (H) 0.61 - 1.24 mg/dL Final  12/11/2016 06:39 PM 2.05 (H) 0.61 - 1.24 mg/dL Final  12/10/2016 09:43 PM 2.01 (H) 0.61 - 1.24 mg/dL Final    PMH:   Past Medical History:  Diagnosis Date  . Atherosclerosis   . Atrial fibrillation ([place])   . 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 Chest pain   . Chronic combined systolic and diastolic heart failure ([place])   . Chronic pain   .  Coronary artery disease   . 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 Hyperlipidemia   . Hypertension    essential  . Ischemic cardiomyopathy   . Major depressive disorder, recurrent ([place])   . Morbid obesity due to excess calories ([place])   . Muscle weakness (generalized)   . Neuropathy   . Obstructive sleep apnea   . Presence of automatic cardioverter/defibrillator (AICD)   . Renal disorder     Chronic kidney disease  . Restless leg syndrome   . Sick-euthyroid syndrome     PSH:   Past Surgical History:  Procedure Laterality Date  . COLONOSCOPY N/A 12/20/2016   Procedure: COLONOSCOPY;  Surgeon: Mauri Pole, MD;  Location: [facility] ENDOSCOPY;  Service: Endoscopy;  Laterality: N/A;    Allergies:  Allergies  Allergen Reactions  . Ampicillin Rash  . Clindamycin/Lincomycin Rash  . Penicillins Rash    Has patient had a PCN reaction causing immediate rash, facial/tongue/throat swelling, SOB or lightheadedness with hypotension: Yes Has patient had a PCN reaction causing severe rash involving mucus membranes or skin necrosis: No Has patient had a PCN reaction that required hospitalization: No Has patient had a PCN reaction occurring within the last 10 years: No If all of the above answers are "NO", then may proceed with Cephalosporin use.     Medications:   Prior to Admission medications   Medication Sig Start Date End Date Taking? Authorizing Provider  acetaminophen (TYLENOL) 325 MG tablet Take 650 mg by mouth every 6 (six) hours as needed.   Yes [provider]  amiodarone (PACERONE) 200 MG tablet Take 200 mg by mouth daily.   Yes [provider]  apixaban (ELIQUIS) 5 MG TABS tablet Take 5 mg by mouth 2 (two) times daily.   Yes [provider]  aspirin EC 81 MG tablet Take 81 mg by mouth daily.   Yes [provider]  atorvastatin (LIPITOR) 80 MG tablet Take 80 mg by mouth every evening.   Yes [provider]  bumetanide (BUMEX) 2 MG tablet Take 2 mg by mouth 2 (two) times daily. *May take one additional tablet every 12 hours as needed for increase in edema/shortness of breath   Yes [provider]  carvedilol (COREG) 3.125 MG tablet Take 3.125 mg by mouth 2 (two) times daily with a meal.   Yes [provider]  docusate sodium (COLACE) 100 MG capsule Take 100 mg by mouth daily.   Yes [provider]   DULoxetine (CYMBALTA) 30 MG capsule Take 30 mg by mouth every evening.   Yes [provider]  gabapentin (NEURONTIN) 100 MG capsule Take 100 mg by mouth 3 (three) times daily.   Yes [provider]  guaifenesin (ROBITUSSIN) 100 MG/5ML syrup Take 200 mg by mouth 3 (three) times daily as needed for cough or congestion.   Yes [provider]  isosorbide mononitrate (IMDUR) 60 MG 24 hr tablet Take 60 mg by mouth daily.   Yes [provider]  magnesium oxide (MAG-OX) 400 MG tablet Take 400 mg by mouth daily.   Yes [provider]  nitroGLYCERIN (NITROSTAT) 0.4 MG SL tablet Place 0.4 mg under the tongue every 5 (five) minutes as needed for chest pain.   Yes [provider]  oxycodone (OXY-IR) 5 MG capsule Take 5 mg by mouth every 4 (four) hours as needed for pain.   Yes [provider]  rOPINIRole (REQUIP) 1 MG tablet Take 1 mg  by mouth at bedtime.   Yes [provider]  senna (SENOKOT) 8.6 MG TABS tablet Take 1 tablet by mouth at bedtime.   Yes [provider]  spironolactone (ALDACTONE) 12.5 mg TABS tablet Take 12.5 mg by mouth daily.   Yes [provider]  sulfamethoxazole-trimethoprim (BACTRIM DS,SEPTRA DS) 800-160 MG tablet Take 1 tablet by mouth 2 (two) times daily. Starting on 12/08/2016-to be completed on 12/17/2016   Yes [provider]  tiotropium (SPIRIVA) 18 MCG inhalation capsule Place 18 mcg into inhaler and inhale daily.   Yes [provider]    Inpatient medications: . amiodarone  200 mg Oral Daily  . atorvastatin  80 mg Oral q1800  . carvedilol  3.125 mg Oral BID WC  . insulin aspart  0-15 Units Subcutaneous TID WC  . sodium chloride flush  10-40 mL Intracatheter Q12H    Discontinued Meds:   Medications Discontinued During This Encounter  Medication Reason  . oxycodone (OXY-IR) immediate release capsule 5 mg   . gabapentin (NEURONTIN) capsule 100 mg   . apixaban (ELIQUIS)  tablet 5 mg   . aspirin EC tablet 81 mg   . sulfamethoxazole-trimethoprim (BACTRIM DS,SEPTRA DS) 800-160 MG per tablet 1 tablet   . ipratropium (ATROVENT) nebulizer solution 0.5 mg Duplicate  . acetaZOLAMIDE (DIAMOX) 12 hr capsule 500 mg   . aspirin chewable tablet 81 mg   . apixaban (ELIQUIS) tablet 5 mg   . LORazepam (ATIVAN) injection 1 mg   . LORazepam (ATIVAN) tablet 1 mg   . LORazepam (ATIVAN) injection 1 mg   . benztropine (COGENTIN) tablet 0.5 mg   . gabapentin (NEURONTIN) capsule 200 mg   . hydrOXYzine (ATARAX/VISTARIL) tablet 25 mg   . rOPINIRole (REQUIP) tablet 1 mg   . acetaminophen (TYLENOL) tablet 650 mg   . apixaban (ELIQUIS) tablet 5 mg   . bumetanide (BUMEX) tablet 2 mg   . spironolactone (ALDACTONE) tablet 12.5 mg   . dextrose 5 % and 0.45% NaCl 1,000 mL infusion Entry Error  . gabapentin (NEURONTIN) capsule 100 mg   . LORazepam (ATIVAN) injection 1 mg   . oxyCODONE (Oxy IR/ROXICODONE) immediate release tablet 5 mg   . ipratropium-albuterol (DUONEB) 0.5-2.5 (3) MG/3ML nebulizer solution 3 mL   . insulin aspart (novoLOG) injection 0-20 Units   . dextrose 5 %-0.45 % sodium chloride infusion   . lactulose (CHRONULAC) 10 GM/15ML solution 20 g   . furosemide (LASIX) injection 80 mg   . cefTRIAXone (ROCEPHIN) 1 g in dextrose 5 % 50 mL IVPB   . amiodarone (PACERONE) tablet 200 mg   . atorvastatin (LIPITOR) tablet 80 mg   . carvedilol (COREG) tablet 3.125 mg   . isosorbide mononitrate (IMDUR) 24 hr tablet 60 mg   . magnesium oxide (MAG-OX) tablet 400 mg   . risperiDONE (RISPERDAL) tablet 0.5 mg   . tiotropium (SPIRIVA) inhalation capsule 18 mcg   . ipratropium-albuterol (DUONEB) 0.5-2.5 (3) MG/3ML nebulizer solution 2.5 mg   . ipratropium-albuterol (DUONEB) 0.5-2.5 (3) MG/3ML nebulizer solution 3 mL   . docusate sodium (COLACE) capsule 100 mg   . haloperidol lactate (HALDOL) injection 2 mg   . haloperidol lactate (HALDOL) injection 5 mg   . chlorhexidine (PERIDEX)  0.12 % solution 15 mL   . MEDLINE mouth rinse   . pantoprazole (PROTONIX) injection 40 mg   . peg 3350 powder (MOVIPREP) kit 200 g   . peg 3350 powder (MOVIPREP) kit 194 g Duplicate  . rOPINIRole (REQUIP) tablet  1 mg   . insulin aspart (novoLOG) injection 2-6 Units   . ceFAZolin (ANCEF) IVPB 1 g/50 mL premix   . sodium chloride flush (NS) 0.9 % injection 10-40 mL   . sodium chloride flush (NS) 0.9 % injection 10-40 mL   . Chlorhexidine Gluconate Cloth 2 % PADS 6 each   . doxycycline (VIBRA-TABS) tablet 100 mg   . metoCLOPramide (REGLAN) injection 10 mg One time medication  . peg 3350 powder (MOVIPREP) kit 100 g One time medication  . insulin aspart (novoLOG) injection 2-6 Units   . furosemide (LASIX) injection 80 mg   . apixaban (ELIQUIS) tablet 5 mg   . bumetanide (BUMEX) tablet 2 mg   . potassium chloride SA (K-DUR,KLOR-CON) CR tablet 40 mEq   . ketorolac (TORADOL) 30 MG/ML injection 30 mg   . sulfamethoxazole-trimethoprim (BACTRIM DS,SEPTRA DS) 800-160 MG per tablet 1 tablet   . apixaban (ELIQUIS) tablet 5 mg   . gabapentin (NEURONTIN) capsule 200 mg     Social History:  reports that he has never smoked. He has never used smokeless tobacco. He reports that he does not drink alcohol or use drugs.  Family History:   Family History  Problem Relation Age of Onset  . Hypertension Father     Pertinent items are noted in HPI. Weight change:   Intake/Output Summary (Last 24 hours) at 12/25/16 1302 Last data filed at 12/25/16 1246  Gross per 24 hour  Intake              155 ml  Output              625 ml  Net             -470 ml   BP 102/67 (BP Location: Left Arm)   Pulse 79   Temp 98.4 F (36.9 C) (Oral)   Resp 18   Ht 5' 8"  (1.727 m)   Wt (!) 327 lb 13.2 oz (148.7 kg)   SpO2 100%   BMI 49.85 kg/m  Vitals:   12/25/16 0131 12/25/16 0442 12/25/16 0814 12/25/16 1225  BP: 117/77 112/68 100/71 102/67  Pulse: 70 70 80 79  Resp: 19 17 19 18   Temp:  97.6 F (36.4 C) 98.3  F (36.8 C) 98.4 F (36.9 C)  TempSrc:  Oral Oral Oral  SpO2: 95% 97% 98% 100%  Weight:  (!) 327 lb 13.2 oz (148.7 kg)    Height:          General: Vital signs reviewed.  Patient is well-developed and well-nourished, morbidly obese, mucous membranes appear dry, in no acute distress and cooperative with exam.  Head: Normocephalic and atraumatic. Eyes: EOMI, conjunctivae normal, no scleral icterus.  Cardiovascular: RRR, S1 normal, S2 normal, no murmurs, gallops, or rubs. Pulmonary/Chest: Bilateral decreased air entry at bases with few crackles. Abdominal: Soft, non-tender, obese, BS +  Extremities: 2+ pitting edema bilaterally more pronounced on the left, signs of venous stasis with 2 bandages on the left lower leg due to some oozing. Psychiatric: Normal mood and affect. speech and behavior is normal. Cognition and memory are normal.  Labs: Basic Metabolic Panel:  Recent Labs Lab 12/19/16 0434 12/21/16 0428 12/22/16 1230 12/23/16 0349 12/24/16 1110 12/25/16 0401  NA 138 139 134* 136 134* 134*  K 3.4* 3.8 4.2 4.5 4.8 5.0  CL 84* 89* 88* 91* 89* 89*  CO2 43* 42* 39* 39* 36* 35*  GLUCOSE 107* 113* 132* 123* 104* 93  BUN 19  17 19 22* 31* 37*  CREATININE 1.28* 1.25* 1.55* 1.78* 2.24* 3.03*  ALBUMIN 3.0*  --   --   --   --   --   CALCIUM 8.3* 8.4* 8.2* 8.4* 8.4* 8.5*   Liver Function Tests:  Recent Labs Lab 12/19/16 0434  AST 48*  ALT 27  ALKPHOS 141*  BILITOT 1.4*  PROT 6.6  ALBUMIN 3.0*   No results for input(s): LIPASE, AMYLASE in the last 168 hours.  Recent Labs Lab 12/24/16 1800  AMMONIA 30   CBC:  Recent Labs Lab 12/21/16 0428 12/22/16 1230 12/24/16 1110 12/25/16 0401  WBC 4.7 7.3 7.5 6.5  HGB 10.0* 10.2* 10.0* 9.7*  HCT 33.3* 34.5* 33.1* 33.3*  MCV 96.8 95.6 96.2 96.0  PLT 194 236 266 246   PT/INR: @LABRCNTIP (inr:5) Cardiac Enzymes: )No results for input(s): CKTOTAL, CKMB, CKMBINDEX, TROPONINI in the last 168 hours. CBG:  Recent Labs Lab  12/22/16 1631 12/22/16 2142 12/23/16 1141 12/24/16 0638 12/24/16 1123  GLUCAP 117* 147* 100* 96 94    Iron Studies: No results for input(s): IRON, TIBC, TRANSFERRIN, FERRITIN in the last 168 hours.  Xrays/Other Studies: Ct Head Wo Contrast  Result Date: 12/23/2016 CLINICAL DATA:  61 year old male status post fall. Headache. A fib. EXAM: CT HEAD WITHOUT CONTRAST TECHNIQUE: Contiguous axial images were obtained from the base of the skull through the vertex without intravenous contrast. COMPARISON:  11/22/2016 FINDINGS: Brain: Cerebral volume is normal for age. No midline shift, ventriculomegaly, mass effect, evidence of mass lesion, intracranial hemorrhage or evidence of cortically based acute infarction. Gray-white matter differentiation is within normal limits throughout the brain. Vascular: Calcified atherosclerosis at the skull base. No suspicious intracranial vascular hyperdensity. Skull: No acute osseous abnormality identified. Sinuses/Orbits: Visualized paranasal sinuses and mastoids are stable and well pneumatized. Other: Decreased scalp soft tissue stranding and swelling posteriorly near midline (series 4, image 52. Otherwise visible orbits and scalp soft tissues are within normal limits. IMPRESSION: 1. Stable and normal for age noncontrast CT appearance of the brain. 2. Mild regression of left posterior scalp contusion. Electronically Signed   By: Genevie Ann M.D.   On: 12/23/2016 15:43   US Renal  Result Date: 12/25/2016 CLINICAL DATA:  61 year old male with acute renal failure EXAM: RENAL / URINARY TRACT ULTRASOUND COMPLETE COMPARISON:  None. FINDINGS: Right Kidney: Length: 11.2 cm. Echogenicity within normal limits. No mass or hydronephrosis visualized. Left Kidney: Length: 10.6 cm. Echogenicity within normal limits. No mass or hydronephrosis visualized. Bladder: The bladder was decompressed. IMPRESSION: Normal renal ultrasound. Electronically Signed   By: Jacqulynn Cadet M.D.   On:  12/25/2016 11:47   Dg Chest Port 1 View  Result Date: 12/24/2016 CLINICAL DATA:  Dyspnea EXAM: PORTABLE CHEST 1 VIEW COMPARISON:  Chest radiograph 12/17/2016 FINDINGS: Left chest wall AICD leads are unchanged in position. Sequelae of median sternotomy. Unchanged cardiomegaly. Small right pleural effusion with associated atelectasis, unchanged. No new region of consolidation. Unchanged left basilar atelectasis. Persistent pulmonary vascular congestion. IMPRESSION: Unchanged small right pleural effusion and associated atelectasis. Pulmonary vascular congestion and left basilar atelectasis without overt pulmonary edema. Electronically Signed   By: Ulyses Jarred M.D.   On: 12/24/2016 14:52     Assessment/Plan: 1. AKI. Most likely due to cardiorenal syndrome secondary to aggressive dialysis in a short period of time. FeNa low. Renal ultrasound normal. Patient is still clinically volume up with bilateral basal crackles and lower extremity edema. We will hold diuretics for now. -Monitor renal function, one started to  improve, gentle diuresis can be restarted.  -Avoid nephrotoxic drugs and contrast.  2. HFrEF. Cardiology is following.  3. HTN. Currently soft  blood pressure.  4. A fib. . Rate controlled on amiodarone and Coreg.Eliquis still on hold due to GI bleed.  5. Anemia possibly related to CKD vs. Acute illness/multiple blood draws and ABLA. Hemoglobin stable after GI bleed. No iron studies found.  - Check Anemia panel.  6. OSA- continue with CPAP  7. CAD- will need further workup once his renal function stabilizes   Sumayya Amin 12/25/2016, 1:02 PM   I have seen and examined this patient and agree with plan and assessment in the above note with renal recommendations/intervention highlighted.  Darrell Black has a very complicated medical history mainly due to his frequent moves and receiving care at multiple hospitals throughout the [place].  He also has some psych issues which prompted his  most recent hospitalization with acute psychosis and was to go to Psych facility, however he has developed decompensated CHF and aggressively diuresed with initial improvement of his Scr, however after 16 liters negative he started to develop worsening renal function.  His FeNa is <1% consistent with intravascular volume depletion despite having edema.  Agree with holding lasix for now (last dose 12/24/16).  Would like to give gentle hydration, however due to his volume overload will just give him time off of diuretics and follow renal function.  This is most consistent with cardiorenal syndrome.  Will continue to follow I's/O's and daily Scr.  No indication for dialysis at this time.    Broadus John A [place] F[place]er,MD 12/25/2016 3:15 PM

## 2016-12-25 NOTE — Progress Notes (Signed)
Name: Darrell Black MRN: [id number] DOB: 11-Feb-1956    ADMISSION DATE:  12/10/2016 CONSULTATION DATE:  12/24/2016  REFERRING MD :  Dr. Tyrell Antonio  CHIEF COMPLAINT:  Hypercarbic respiratory failure  HISTORY OF PRESENT ILLNESS:   61 y.o.malewith a history of obstructive sleep apnea on cpap, morbid obesity, atherosclerosis, atrial fibrillation, chronic combined systolic and diastolic heart failure, chronic pain, coronary artery disease, ischemic cardiomyopathy, type 2 diabetes, hyperlipidemia, hypertension, depression, generalized muscle weakness, peripheral neuropathy, chronic kidney disease, restless leg syndrome, and sick euthyroid syndrome admitted on 10/1 with hallucinations and acute psychosis.  Became progressively short of breath requiring BiPAP.  His encephalopathy thought multifactorial given his CKD, multiple psychotropic meds, possible HCAP, and acute on chronic hypercapnia. Per patient he does wear chronic O2 at home.( 2L at rest and 3-4 L with exertion.  Transferred from [facility] on to ICU on 10/6 due to lower GI bleeding on eliquis.  He was diuresed and on 10/6 came off BiPAP to 2L [place] and transferred to stepdown on 10/9 and remained on [place] only.  Cleared by psychiatry on 10/9 with recommendations not to continue pysch meds and followup outpatient.  Underwent colonscopy on 10/11 by GI found to have one polyp in sigmoid colon removed and non-bleeding internal hemorrhoids.  Lasix held on 10/15 due to increased sCr.  Currently treated with bactrim for BLE cellulitis.  On 10/15,  PCCM re-consulted for worsening SOB and somnolence.  ABG 7.3/75.8/113.  Patient placed back on BiPAP.   SUBJECTIVE:  Compliant with BiPAP at sleep and with naps 10/15 and 10/16. Per nursing staff is more alert and " cranky" , back to his baseline.   VITAL SIGNS: Temp:  [97.1 F (36.2 C)-98.3 F (36.8 C)] 98.3 F (36.8 C) (10/16 0814) Pulse Rate:  [70-80] 80 (10/16 0814) Resp:  [17-23] 19 (10/16 0814) BP:  (99-117)/(66-77) 100/71 (10/16 0814) SpO2:  [93 %-99 %] 98 % (10/16 0814) Weight:  [327 lb 13.2 oz (148.7 kg)] 327 lb 13.2 oz (148.7 kg) (10/16 0442)  PHYSICAL EXAMINATION: General:  Chronically ill appearing male sitting in chair on 2 L [place] HEENT: Normocephalic, with abrasions to nose , wearing nasal oxygen Neuro:  Awake, alert and appropriate CV: S1S2 RRR, , NO RMG PULM: even/non-labored, lungs with few rales right base, otherwise clear, diminished in bases GI: obese, soft, non-tender, non-distended, BS +   Extremities: warm/dry, BLE lower leg discoloration,Left with some warmth,  1-2 +LE Bilateral  edema Skin: no rashes,  Signs of chronic LE edema bilaterally, no lesions    Recent Labs Lab 12/23/16 0349 12/24/16 1110 12/25/16 0401  NA 136 134* 134*  K 4.5 4.8 5.0  CL 91* 89* 89*  CO2 39* 36* 35*  BUN 22* 31* 37*  CREATININE 1.78* 2.24* 3.03*  GLUCOSE 123* 104* 93    Recent Labs Lab 12/22/16 1230 12/24/16 1110 12/25/16 0401  HGB 10.2* 10.0* 9.7*  HCT 34.5* 33.1* 33.3*  WBC 7.3 7.5 6.5  PLT 236 266 246   Ct Head Wo Contrast  Result Date: 12/23/2016 CLINICAL DATA:  61 year old male status post fall. Headache. A fib. EXAM: CT HEAD WITHOUT CONTRAST TECHNIQUE: Contiguous axial images were obtained from the base of the skull through the vertex without intravenous contrast. COMPARISON:  11/22/2016 FINDINGS: Brain: Cerebral volume is normal for age. No midline shift, ventriculomegaly, mass effect, evidence of mass lesion, intracranial hemorrhage or evidence of cortically based acute infarction. Gray-white matter differentiation is within normal limits throughout the brain. Vascular: Calcified atherosclerosis at  the skull base. No suspicious intracranial vascular hyperdensity. Skull: No acute osseous abnormality identified. Sinuses/Orbits: Visualized paranasal sinuses and mastoids are stable and well pneumatized. Other: Decreased scalp soft tissue stranding and swelling posteriorly  near midline (series 4, image 52. Otherwise visible orbits and scalp soft tissues are within normal limits. IMPRESSION: 1. Stable and normal for age noncontrast CT appearance of the brain. 2. Mild regression of left posterior scalp contusion. Electronically Signed   By: Genevie Ann M.D.   On: 12/23/2016 15:43   Dg Chest Port 1 View  Result Date: 12/24/2016 CLINICAL DATA:  Dyspnea EXAM: PORTABLE CHEST 1 VIEW COMPARISON:  Chest radiograph 12/17/2016 FINDINGS: Left chest wall AICD leads are unchanged in position. Sequelae of median sternotomy. Unchanged cardiomegaly. Small right pleural effusion with associated atelectasis, unchanged. No new region of consolidation. Unchanged left basilar atelectasis. Persistent pulmonary vascular congestion. IMPRESSION: Unchanged small right pleural effusion and associated atelectasis. Pulmonary vascular congestion and left basilar atelectasis without overt pulmonary edema. Electronically Signed   By: Ulyses Jarred M.D.   On: 12/24/2016 14:52    ASSESSMENT / PLAN:  Acute on chronic hypercarbic/ hypoxic respiratory failure OSA- on cpap prior to admission, unknown settings though Morbid obesity CXR 10/15>> Unchanged small right pleural effusion and associated atelectasis. Pulmonary vascular congestion and left basilar atelectasis without overt pulmonary edema. - last on BiPAP 10/6, not on CPAP since admission - last received ativan 1mg   10/14 around 2200 - currently negative -15. 4 L - afebrile, normal WBC - CXR as above - Will need CPAP on discharge - Will need follow up with pulmonary for sleep study/titration to determine CPAP needs - Last ABG 10/15 PCO2 >>75.6 , 10/16  patient awake and agitated, appropriate per nursing staff , back to baseline-  - Consider follow up ABG to confirm clearance of CO2  P:   Monitor for inability to protect airway/ need for intubation Continue BiPAP PRN while sleeping and mandatory HS Trend CXR Holding lasix given renal  function, and agree with d/c other renal toxic meds (toradol/bactrim) Agree with renal ultrasound 10/16 Continue BiPAP PRN while sleeping and mandatory HS as patient will agree to wear. Avoid sedating meds/ Tylenol only for pain at present Continue aggressive pulmonary hygiene and mobilize with PT CXR 10/15 grossly unchanged from 10/8 PCT < 0.10 on 10/15 and 10/16, Lactate 0.7 on 10/15 and 1.0 on 10/16, so doubt infectious etiology as cause of respiratory failure   Discussion :  Discussed the importance of adherence to CPAP therapy, and long term risks of untreated OSA. Pt. States he understands importance of wearing CPAP, and will be compliant with treatment. Will need pulmonary follow up as outpatient for OSA.  Remainder per primary team  Magdalen Spatz, AGACNP-BC [place] Pgr: [phone] 12/25/2016, 11:12 AM

## 2016-12-25 NOTE — Progress Notes (Signed)
Progress Note  Patient Name: Darrell Black Date of Encounter: 12/25/2016  Primary Cardiologist: Vernon Prey Pasi at [facility] here  Subjective   Pt is not voiding much.  Inpatient Medications    Scheduled Meds: . amiodarone  200 mg Oral Daily  . atorvastatin  80 mg Oral q1800  . carvedilol  3.125 mg Oral BID WC  . insulin aspart  0-15 Units Subcutaneous TID WC  . sodium chloride flush  10-40 mL Intracatheter Q12H   Continuous Infusions:  PRN Meds: acetaminophen, albuterol, metoprolol tartrate, nitroGLYCERIN, ondansetron **OR** ondansetron (ZOFRAN) IV, sodium chloride flush   Vital Signs    Vitals:   12/25/16 0042 12/25/16 0131 12/25/16 0442 12/25/16 0814  BP: 117/77 117/77 112/68 100/71  Pulse: 70 70 70 80  Resp: 19 19 17 19   Temp: 97.9 F (36.6 C)  97.6 F (36.4 C) 98.3 F (36.8 C)  TempSrc: Oral  Oral Oral  SpO2: 93% 95% 97% 98%  Weight:   (!) 327 lb 13.2 oz (148.7 kg)   Height:        Intake/Output Summary (Last 24 hours) at 12/25/16 1147 Last data filed at 12/25/16 1113  Gross per 24 hour  Intake              270 ml  Output              925 ml  Net             -655 ml   Filed Weights   12/19/16 0358 12/20/16 0541 12/25/16 0442  Weight: (!) 316 lb 6.4 oz (143.5 kg) (!) 315 lb 12.8 oz (143.2 kg) (!) 327 lb 13.2 oz (148.7 kg)     Physical Exam   General: Well developed, well nourished, male appearing in no acute distress. Head: Normocephalic, atraumatic.  Neck: Supple without bruits, no JVD. Lungs:  Resp regular and unlabored, CTA. Heart: Irregular rhythm, regular rate no murmur; no rub. Abdomen: Soft, non-tender, non-distended with normoactive bowel sounds. No hepatomegaly. No rebound/guarding. No obvious abdominal masses. Extremities: No clubbing, cyanosis, at least 1+ edema with chronic skin changes and erythema. Distal pedal pulses are faint bilaterally. Neuro: Alert and oriented X 3. Moves all extremities spontaneously. Psych: Normal  affect.  Labs    Chemistry Recent Labs Lab 12/19/16 0434  12/23/16 0349 12/24/16 1110 12/25/16 0401  NA 138  < > 136 134* 134*  K 3.4*  < > 4.5 4.8 5.0  CL 84*  < > 91* 89* 89*  CO2 43*  < > 39* 36* 35*  GLUCOSE 107*  < > 123* 104* 93  BUN 19  < > 22* 31* 37*  CREATININE 1.28*  < > 1.78* 2.24* 3.03*  CALCIUM 8.3*  < > 8.4* 8.4* 8.5*  PROT 6.6  --   --   --   --   ALBUMIN 3.0*  --   --   --   --   AST 48*  --   --   --   --   ALT 27  --   --   --   --   ALKPHOS 141*  --   --   --   --   BILITOT 1.4*  --   --   --   --   GFRNONAA 59*  < > 39* 30* 21*  GFRAA >60  < > 46* 35* 24*  ANIONGAP 11  < > 6 9 10   < > = values in this interval  not displayed.   Hematology Recent Labs Lab 12/22/16 1230 12/24/16 1110 12/25/16 0401  WBC 7.3 7.5 6.5  RBC 3.61* 3.44* 3.47*  HGB 10.2* 10.0* 9.7*  HCT 34.5* 33.1* 33.3*  MCV 95.6 96.2 96.0  MCH 28.3 29.1 28.0  MCHC 29.6* 30.2 29.1*  RDW 15.5 15.2 15.4  PLT 236 266 246    Cardiac EnzymesNo results for input(s): TROPONINI in the last 168 hours. No results for input(s): TROPIPOC in the last 168 hours.   BNPNo results for input(s): BNP, PROBNP in the last 168 hours.   DDimer No results for input(s): DDIMER in the last 168 hours.   Radiology    Ct Head Wo Contrast  Result Date: 12/23/2016 CLINICAL DATA:  61 year old male status post fall. Headache. A fib. EXAM: CT HEAD WITHOUT CONTRAST TECHNIQUE: Contiguous axial images were obtained from the base of the skull through the vertex without intravenous contrast. COMPARISON:  11/22/2016 FINDINGS: Brain: Cerebral volume is normal for age. No midline shift, ventriculomegaly, mass effect, evidence of mass lesion, intracranial hemorrhage or evidence of cortically based acute infarction. Gray-white matter differentiation is within normal limits throughout the brain. Vascular: Calcified atherosclerosis at the skull base. No suspicious intracranial vascular hyperdensity. Skull: No acute osseous  abnormality identified. Sinuses/Orbits: Visualized paranasal sinuses and mastoids are stable and well pneumatized. Other: Decreased scalp soft tissue stranding and swelling posteriorly near midline (series 4, image 52. Otherwise visible orbits and scalp soft tissues are within normal limits. IMPRESSION: 1. Stable and normal for age noncontrast CT appearance of the brain. 2. Mild regression of left posterior scalp contusion. Electronically Signed   By: Genevie Ann M.D.   On: 12/23/2016 15:43   Dg Chest Port 1 View  Result Date: 12/24/2016 CLINICAL DATA:  Dyspnea EXAM: PORTABLE CHEST 1 VIEW COMPARISON:  Chest radiograph 12/17/2016 FINDINGS: Left chest wall AICD leads are unchanged in position. Sequelae of median sternotomy. Unchanged cardiomegaly. Small right pleural effusion with associated atelectasis, unchanged. No new region of consolidation. Unchanged left basilar atelectasis. Persistent pulmonary vascular congestion. IMPRESSION: Unchanged small right pleural effusion and associated atelectasis. Pulmonary vascular congestion and left basilar atelectasis without overt pulmonary edema. Electronically Signed   By: Ulyses Jarred M.D.   On: 12/24/2016 14:52     Telemetry    Rate controlled Afib - Personally Reviewed  ECG    No new tracings - Personally Reviewed   Cardiac Studies   Echocardiogram 12/12/16: Study Conclusions - Procedure narrative: Contrast enhancement employed. - Left ventricle: The cavity size was normal. Wall thickness was increased in a pattern of mild LVH. Systolic function was moderately reduced. The estimated ejection fraction was in the range of 35% to 40%. Diffuse hypokinesis. The study was not technically sufficient to allow evaluation of LV diastolic dysfunction due to atrial fibrillation. - Regional wall motion abnormality: Akinesis of the apical septal and apical myocardium; moderate hypokinesis of the basal-mid anteroseptal myocardium. - Aortic  valve: Mildly calcified leaflets. - Mitral valve: Moderately calcified annulus. Normal thickness leaflets . - Left atrium: The atrium was mildly dilated. - Right ventricle: Pacer wire or catheter noted in right ventricle. Systolic function was mildly reduced. - Tricuspid valve: There was mild regurgitation. - Pulmonary arteries: PA peak pressure: 38 mm Hg (S). - Systemic veins: IVC is dilated wtih normal respiratory variation. Estimated CVP 8 mmHg.  Patient Profile     61 y.o. male with a history of atrial fibrillation and acute on chronic systolic and diastolic congestive heart failure. He's had recent  GI bleeding. Colonoscopy 10/11/18revealed a 12 mm polyp which was removed. He was also found to have hemorrhoids that were not bleeding. The gastroenterology team has given the okay to restart Eliquis, per cardiology notes on 12/21/16 (not yet started). IM is holding eliquis given his recent fall and complaints of dizziness.  Assessment & Plan    1. Acute on chronic systolic heart failure - LVEF was 35-40% by echo in the setting of Afib - overall net negative 15.5L with decreased urine output yesterday - lasix have been on hold for renal function: sCr today 3.03 - last lasix dose was 40 mg IV on 12/24/16 - consider restarting spironolactone - given respiratory distress yesterday, consider OTO 80 mg IV lasix - will discuss with attending   2. Respiratory distress - PCCM re-consulted overnight for SOB and somnolence - pt requiring BiPAP during sleep and naps - ? Role of diuretic   3. Acute on chronic kidney injury stage III - sCr rising: 1.78 --> 2.24 (lasix held) --> 3.03 - per primary team - Will not start ACEI/ARB - renal US was normal today - consider bladder scan if not already completed to rule out retention   4. Chronic Afib - eliquis on hold - OK to restart eliquis per cardiology - decided by primary team 2/ fall and dizziness   Signed, Ledora Bottcher ,  PA-C 11:47 AM 12/25/2016 Pager: [phone]  Personally seen and examined. Agree with above.  Noting that he is "gaining fluid"  AKI  - creat worse. Holding lasix. Maybe restart tomorrow gently.   - holding off of spironolactone with AKI for fear of hyperkalemia  AFIB/acute on chronic systolic HF  - With ARF may not be a good idea to restart NOAC at this time. ? If need to transition to coumadin. AKI seems to be a recurrent issue.   - challenging.   Candee Furbish, MD

## 2016-12-25 NOTE — Progress Notes (Signed)
Physical Therapy Treatment Patient Details Name: Gurkirat Basher MRN: [id number] DOB: [birth year] Today's Date: 12/25/2016    History of Present Illness 61 year old male with past medical history of atrial fibrillation, chronic systolic/diastolic heart failure, diabetes mellitus, morbid obesity and obstructive sleep apnea brought to the emergency room on 9/30 for visual hallucinations 4 days. Initially, patient waiting for transfer to psychiatric facility but over the next 24 hours, developed worsening dyspnea and sedation.     PT Comments    Patient required min guard for safety with mobility and with one LOB when reaching outside BOS with single UE support. Pt required 4-6L O2 via [place] throughout session and with 6L O2 SpO2 92% with ambulation. Pt with 2/4 DOE with mobility and required increased time and rest breaks. Current plan remains appropriate.    Follow Up Recommendations  SNF     Equipment Recommendations  None recommended by PT    Recommendations for Other Services       Precautions / Restrictions Precautions Precautions: Fall Restrictions Weight Bearing Restrictions: No    Mobility  Bed Mobility               General bed mobility comments: pt OOB in chair upon arrival  Transfers Overall transfer level: Needs assistance Equipment used: Rolling walker (2 wheeled) Transfers: Sit to/from Stand Sit to Stand: Min guard         General transfer comment: min guard for safety  Ambulation/Gait Ambulation/Gait assistance: Min guard Ambulation Distance (Feet):  (157ft total with 2 seated/1 standing rest breaks  ) Assistive device: Rolling walker (2 wheeled) Gait Pattern/deviations: Step-through pattern;Decreased stride length Gait velocity: decreased   General Gait Details: pt on 4-6L O3 via [place] with mobility to maintain SpO2 90% or >; cues for pursed lip breathing and posture; pt with 2/4 DOE    Financial trader  [place] (Stroke Patients Only)       Balance Overall balance assessment: Needs assistance Sitting-balance support: No upper extremity supported;Feet supported Sitting balance-Leahy Scale: Good     Standing balance support: Bilateral upper extremity supported;During functional activity Standing balance-Leahy Scale: Poor Standing balance comment: heavy reliance on RW                            Cognition Arousal/Alertness: Awake/alert Behavior During Therapy: WFL for tasks assessed/performed;Anxious Overall Cognitive Status: No family/caregiver present to determine baseline cognitive functioning                                        Exercises      General Comments        Pertinent Vitals/Pain Pain Assessment: Faces Faces Pain Scale: Hurts a little bit Pain Location: R knee with mobility Pain Descriptors / Indicators: Aching Pain Intervention(s): Monitored during session;Repositioned;Limited activity within patient's tolerance    Home Living                      Prior Function            PT Goals (current goals can now be found in the care plan section) Acute Rehab PT Goals Patient Stated Goal: to [place]  PT Goal Formulation: With patient Time For Goal Achievement: 01/04/17 Potential to Achieve Goals: Good Progress towards PT goals:  Progressing toward goals    Frequency    Min 3X/week      PT Plan Current plan remains appropriate    Co-evaluation              AM-PAC PT "6 Clicks" Daily Activity  Outcome Measure  Difficulty turning over in bed (including adjusting bedclothes, sheets and blankets)?: A Little Difficulty moving from lying on back to sitting on the side of the bed? : A Lot Difficulty sitting down on and standing up from a chair with arms (e.g., wheelchair, bedside commode, etc,.)?: A Little Help needed moving to and from a bed to chair (including a wheelchair)?: A Little Help needed walking in  hospital room?: A Little Help needed climbing 3-5 steps with a railing? : A Little 6 Click Score: 17    End of Session Equipment Utilized During Treatment: Gait belt;Oxygen Activity Tolerance: Patient tolerated treatment well Patient left: in chair;with call bell/phone within reach Nurse Communication: Mobility status PT Visit Diagnosis: Other abnormalities of gait and mobility (R26.89);Muscle weakness (generalized) (M62.81)     Time: 8616-8372 PT Time Calculation (min) (ACUTE ONLY): 45 min  Charges:  $Gait Training: 23-37 mins $Therapeutic Activity: 8-22 mins                    G Codes:       Earney Navy, PTA Pager: [phone]     Darliss Cheney 12/25/2016, 11:10 AM

## 2016-12-25 NOTE — Progress Notes (Signed)
PROGRESS NOTE    Darrell Black  DGL:[id number] DOB: 07-20-1955 DOA: 12/10/2016 PCP: Renata Caprice, DO   Brief Narrative: 61 year old male with past medical history of atrial fibrillation, chronic systolic/diastolic heart failure, diabetes mellitus, morbid obesity and obstructive sleep apnea brought to the emergency room on 9/30 for visual hallucinations 4 days. Initially, patient waiting for transfer to psychiatric facility but over the next 24 hours, developed worsening dyspnea and sedation, possibly from her psychotropic medications. He started to become hypoxic and felt to be in volume overload. Admitted to the hospitalist service at [facility] and placed in stepdown on 10/3.  Patient started on IV diuretics and initially responded well, diuresing over 4 L. Followed by tele-psychiatry.  Following hospitalization, patient started having some mild bleeding from his gums, treated with tranexemic acid and his eliquis was held. He continued to remain hypoxic, requiring BiPAP.  On night of 10/5, patient started having some gross blood per rectum. Remained hemodynamically stable, but hemoglobin dropped from 9.52 days prior to 8.8 by next morning. No hematemesis. PICC line placed 10/6. Patient remained unresponsive on BiPAP. Seen by GI and it was recommended he be transferred to critical care at [facility]. Patient's lack of responsiveness was felt to be secondary to acute psychosis plus hypercapnia plus polypharmacy. By holding sedating medications, patient became more awake by 10/8. Felt to be in acute psychosis and psychiatry consulted. Cardiology also consulted for acute on chronic heart failure.  Transfer back to hospitalist service on 10/9 morning. Cardiology recommended continuing Lasix. Since admission, he is now 14 L diuresed. GI recommended holding eliquis and planning for colonoscopy if unable to get reports previous colonoscopy or rectal bleeding persisted. 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  Worsening MS 10-15. Multifactorial, hypercapnia, OSA, sedatives Avoid sedatives.  Improved on BIPAP. Need BIPAP at HS and during the day if sleepy.   Acute on chronic systolic congestive heart failure ([place]):  -Ejection fraction notes EF of 25-30 percent with atrial fibrillation and unable to ascertain diastolic dysfunction. -Cardiology following.  -negtaive 15 L.  Holding diuresis due to worsening renal failure.   Encephalopathy;  He was notice to be more confuse overnight 10-15, he received one time dose of ativan. Avoid sedatives.  Lethargic on 10-15. Improved on BIPAP. Related to Xanax, hypercapnia.  Improved. Need BIPAP at HS  Dizziness, headaches;  He is complaining of dizziness and headaches.  Orthostatic negative.  CT head negative.  Plan to get PT evaluation, to assess risk fall.   Major depression with Psychotic feature.  Clear by psych .  Needs to follow up outpatient for medications management.  No antipsychotic medications at this time.  Hold Requip which could cause psychosis.   GI bleed, recta bleeding.  Underwent colonoscopy, showed hemorrhoids, polyp, S/P polypectomy.  Hb stable.   Cellulitis; lower  extremities;  On Ancef. Day 5. Doxy change to bactrim due to itchiness.  Received 3 days of bactrim. Total 8 days antibiotics.   Acute renal failure/acute kidney injury; On admission he had renal failure, Secondary to poor perfusion. Creatinine has been steadily improving. Peaked his high as 2.2. Down to 1.36,  Renal function started to get worse again on 10-13: 1.5---1.7----2 today.  Diuretics has been on hold since 10-13 afternoon. He received Toradol overnight, will discontinue Bactrim.  Check UA, renal US>  Renal consulted.   Afib; holding anticoagulation due to GI bleed.  Hold apixiban due to recent fall, PT evaluation. Also he was having dizziness 10-14. Needs PT evaluation. Now cr at 2.  Cardiology to to give further recommendation.  Hold eliquis due to AKI.   Restless leg syndrome: Per Psych  Requip could cause psychosis, plan to hold that medication for now.  Started Gabapentin low dose.   Diabetes mellitus without complication ([place]):? Maybe erroneous diagnosis. A1c at 5.4 and patient not on home medications.  Morbid obesity due to excess calories [facility]) patient meets criteria with BMI greater than 40.  Hypernatremia; monitor on lasix. Resolved.   Hypokalemia; oral potassium supplements.  Fall; CT head negative.  Elevated TSH; free t 3 and Free  t 4 normal.   DVT prophylaxis: SCD. No anticoagulation due to GI bleed  Code Status: full code.  Family Communication: care discussed with patient.  Disposition Plan: home when stable.    Consultants:   GI  Cardiology    Procedures:  ECHO; Procedure narrative: Contrast enhancement employed. - Left ventricle: The cavity size was normal. Wall thickness was   increased in a pattern of mild LVH. Systolic function was   moderately reduced. The estimated ejection fraction was in the   range of 35% to 40%. Diffuse hypokinesis. The study was not   technically sufficient to allow evaluation of LV diastolic   dysfunction due to  atrial fibrillation. - Regional wall motion abnormality: Akinesis of the apical septal   and apical myocardium; moderate hypokinesis of the basal-mid    anteroseptal myocardium.     Antimicrobials:   Ancef   Subjective: He is feeling dizzy, complaining of headaches.     Objective: Vitals:   12/25/16 0442 12/25/16 0814 12/25/16 1225 12/25/16 1602  BP: 112/68 100/71 102/67 103/68  Pulse: 70 80 79 78  Resp: 17 19 18 15   Temp: 97.6 F (36.4 C) 98.3 F (36.8 C) 98.4 F (36.9 C) 97.9 F (36.6 C)  TempSrc: Oral Oral Oral Oral  SpO2: 97% 98% 100% 98%  Weight: (!) 148.7 kg (327 lb 13.2 oz)     Height:        Intake/Output Summary (Last 24 hours) at 12/25/16 1605 Last data filed at 12/25/16 1246  Gross per 24 hour  Intake              155 ml  Output              625 ml  Net             -470 ml   Filed Weights   12/19/16 0358 12/20/16 0541 12/25/16 0442  Weight: (!) 143.5 kg (316 lb 6.4 oz) (!) 143.2 kg (315 lb 12.8 oz) (!) 148.7 kg (327 lb 13.2 oz)    Examination:  General exam: Alert.  Respiratory system: CTA Cardiovascular system:  S 1, S  2  Gastrointestinal system: BS present. Soft, nt Central nervous system: non focal.  Extremities: bilateral edema, and redness.       Data Reviewed: I have personally reviewed following labs and imaging studies  CBC:  Recent Labs Lab 12/19/16 0434 12/21/16 0428 12/22/16 1230 12/24/16 1110 12/25/16 0401  WBC 6.5 4.7 7.3 7.5 6.5  HGB 10.4* 10.0* 10.2* 10.0* 9.7*  HCT 35.8* 33.3* 34.5* 33.1* 33.3*  MCV 96.2 96.8 95.6 96.2 96.0  PLT 234 194 236 266 413   Basic Metabolic Panel:  Recent Labs Lab 12/21/16 0428 12/22/16 1230 12/23/16 0349 12/24/16 1110 12/25/16 0401  NA 139 134* 136 134* 134*  K 3.8 4.2 4.5 4.8 5.0  CL 89* 88* 91* 89* 89*  CO2 42* 39* 39* 36* 35*  GLUCOSE 113* 132* 123* 104* 93  BUN 17 19 22* 31* 37*  CREATININE 1.25* 1.55* 1.78* 2.24* 3.03*  CALCIUM 8.4* 8.2* 8.4* 8.4* 8.5*    GFR: Estimated Creatinine Clearance: 36.4 mL/min (A) (by C-G formula based on SCr of 3.03 mg/dL (H)). Liver Function Tests:  Recent Labs Lab 12/19/16 0434  AST 48*  ALT 27  ALKPHOS 141*  BILITOT 1.4*  PROT 6.6  ALBUMIN 3.0*   No results for input(s): LIPASE, AMYLASE in the last 168 hours.  Recent Labs Lab 12/24/16 1800  AMMONIA 30   Coagulation Profile: No results for input(s): INR, PROTIME in the last 168 hours. Cardiac Enzymes: No results for input(s): CKTOTAL, CKMB, CKMBINDEX, TROPONINI in the last 168 hours. BNP (last 3 results) No results for input(s): PROBNP in the last 8760 hours. HbA1C: No results for input(s): HGBA1C in the last 72 hours. CBG:  Recent Labs Lab 12/22/16 1631 12/22/16 2142 12/23/16 1141 12/24/16 0638 12/24/16 1123  GLUCAP 117* 147* 100* 96 94   Lipid Profile: No results for input(s): CHOL, HDL, LDLCALC, TRIG, CHOLHDL, LDLDIRECT in the last 72 hours. Thyroid Function Tests: No results for input(s): TSH, T4TOTAL, FREET4, T3FREE, THYROIDAB in the last 72 hours. Anemia Panel: No results for input(s): VITAMINB12, FOLATE, FERRITIN, TIBC, IRON, RETICCTPCT in the last 72 hours. Sepsis Labs:  Recent Labs Lab 12/24/16 1800 12/24/16 2145 12/25/16 0401  PROCALCITON <0.10  --  <0.10  LATICACIDVEN 0.7 1.0  --     No results found for this or any previous visit (from the past 240 hour(s)).       Radiology Studies: US Renal  Result Date: 12/25/2016 CLINICAL DATA:  61 year old male with acute renal failure EXAM: RENAL / URINARY TRACT ULTRASOUND COMPLETE COMPARISON:  None. FINDINGS: Right Kidney: Length: 11.2 cm. Echogenicity within normal limits. No mass or hydronephrosis visualized. Left Kidney: Length: 10.6 cm. Echogenicity within normal limits. No mass or hydronephrosis visualized. Bladder: The bladder was decompressed. IMPRESSION: Normal renal ultrasound. Electronically Signed   By: Jacqulynn Cadet M.D.   On: 12/25/2016 11:47   Dg  Chest Port 1 View  Result Date: 12/24/2016 CLINICAL DATA:  Dyspnea EXAM: PORTABLE CHEST 1 VIEW COMPARISON:  Chest radiograph 12/17/2016 FINDINGS: Left chest wall AICD leads are unchanged in position. Sequelae of median sternotomy. Unchanged cardiomegaly. Small right pleural effusion with associated atelectasis, unchanged. No new region of consolidation. Unchanged left basilar atelectasis. Persistent pulmonary vascular congestion. IMPRESSION: Unchanged small right pleural effusion and associated atelectasis. Pulmonary vascular congestion and left basilar atelectasis without overt pulmonary edema. Electronically Signed   By: Ulyses Jarred M.D.   On: 12/24/2016 14:52        Scheduled Meds: . amiodarone  200 mg  Oral Daily  . atorvastatin  80 mg Oral q1800  . carvedilol  3.125 mg Oral BID WC  . insulin aspart  0-15 Units Subcutaneous TID WC  . sodium chloride flush  10-40 mL Intracatheter Q12H   Continuous Infusions:    LOS: 13 days    Time spent: 35 minutes.     Elmarie Shiley, MD [facility] Pager [phone]  If 7PM-7AM, please contact night-coverage [email or web address] Password [email or web address] 12/25/2016, 4:05 PM

## 2016-12-26 LAB — RETICULOCYTES
RBC.: 3.37 MIL/uL — ABNORMAL LOW (ref 4.22–5.81)
RETIC COUNT ABSOLUTE: 30.3 10*3/uL (ref 19.0–186.0)
Retic Ct Pct: 0.9 % (ref 0.4–3.1)

## 2016-12-26 LAB — CBC
HEMATOCRIT: 31.7 % — AB (ref 39.0–52.0)
HEMOGLOBIN: 9.5 g/dL — AB (ref 13.0–17.0)
MCH: 28.2 pg (ref 26.0–34.0)
MCHC: 30 g/dL (ref 30.0–36.0)
MCV: 94.1 fL (ref 78.0–100.0)
Platelets: 263 10*3/uL (ref 150–400)
RBC: 3.37 MIL/uL — ABNORMAL LOW (ref 4.22–5.81)
RDW: 15.3 % (ref 11.5–15.5)
WBC: 6.6 10*3/uL (ref 4.0–10.5)

## 2016-12-26 LAB — RENAL FUNCTION PANEL
ALBUMIN: 3.1 g/dL — AB (ref 3.5–5.0)
Anion gap: 8 (ref 5–15)
BUN: 43 mg/dL — ABNORMAL HIGH (ref 6–20)
CALCIUM: 8.2 mg/dL — AB (ref 8.9–10.3)
CHLORIDE: 90 mmol/L — AB (ref 101–111)
CO2: 35 mmol/L — ABNORMAL HIGH (ref 22–32)
CREATININE: 2.64 mg/dL — AB (ref 0.61–1.24)
GFR, EST AFRICAN AMERICAN: 28 mL/min — AB (ref >60)
GFR, EST NON AFRICAN AMERICAN: 25 mL/min — AB (ref >60)
Glucose, Bld: 106 mg/dL — ABNORMAL HIGH (ref 65–99)
PHOSPHORUS: 4.4 mg/dL (ref 2.5–4.6)
Potassium: 5.2 mmol/L — ABNORMAL HIGH (ref 3.5–5.1)
SODIUM: 133 mmol/L — AB (ref 135–145)

## 2016-12-26 LAB — IRON AND TIBC
IRON: 20 ug/dL — AB (ref 45–182)
Saturation Ratios: 7 % — ABNORMAL LOW (ref 17.9–39.5)
TIBC: 293 ug/dL (ref 250–450)
UIBC: 273 ug/dL

## 2016-12-26 LAB — GLUCOSE, CAPILLARY
GLUCOSE-CAPILLARY: 94 mg/dL (ref 65–99)
GLUCOSE-CAPILLARY: 105 mg/dL — AB (ref 65–99)
Glucose-Capillary: 110 mg/dL — ABNORMAL HIGH (ref 65–99)
Glucose-Capillary: 137 mg/dL — ABNORMAL HIGH (ref 65–99)

## 2016-12-26 LAB — FERRITIN: Ferritin: 76 ng/mL (ref 24–336)

## 2016-12-26 LAB — PROCALCITONIN

## 2016-12-26 LAB — FOLATE: FOLATE: 7.4 ng/mL (ref >5.9)

## 2016-12-26 LAB — VITAMIN B12: Vitamin B-12: 515 pg/mL (ref 180–914)

## 2016-12-26 MED ORDER — FUROSEMIDE 10 MG/ML IJ SOLN
40.0000 mg | Freq: Every day | INTRAMUSCULAR | Status: DC
  Administered 2016-12-26 – 2016-12-29 (×4): 40 mg via INTRAVENOUS
  Filled 2016-12-26 (×4): qty 4

## 2016-12-26 MED ORDER — SODIUM CHLORIDE 0.9 % IV SOLN
510.0000 mg | INTRAVENOUS | Status: DC
  Administered 2016-12-26: 510 mg via INTRAVENOUS
  Filled 2016-12-26: qty 17

## 2016-12-26 MED ORDER — GABAPENTIN 100 MG PO CAPS
100.0000 mg | ORAL_CAPSULE | Freq: Two times a day (BID) | ORAL | Status: DC
  Administered 2016-12-26 – 2016-12-31 (×11): 100 mg via ORAL
  Filled 2016-12-26 (×11): qty 1

## 2016-12-26 MED ORDER — SODIUM POLYSTYRENE SULFONATE 15 GM/60ML PO SUSP
30.0000 g | Freq: Once | ORAL | Status: DC
  Filled 2016-12-26: qty 120

## 2016-12-26 NOTE — Progress Notes (Addendum)
Progress Note  Patient Name: Darrell Black Date of Encounter: 12/26/2016  Primary Cardiologist: Vernon Prey Pasi at [facility] here  Subjective   Urine output remains low, creatinine improving. Pt is breathing better  Inpatient Medications    Scheduled Meds: . amiodarone  200 mg Oral Daily  . atorvastatin  80 mg Oral q1800  . bisacodyl  10 mg Rectal Once  . carvedilol  3.125 mg Oral BID WC  . docusate sodium  100 mg Oral BID  . furosemide  40 mg Intravenous Daily  . gabapentin  100 mg Oral BID  . insulin aspart  0-15 Units Subcutaneous TID WC  . senna  1 tablet Oral BID  . sodium chloride flush  10-40 mL Intracatheter Q12H  . sodium polystyrene  30 g Oral Once   Continuous Infusions:  PRN Meds: acetaminophen, albuterol, metoprolol tartrate, nitroGLYCERIN, ondansetron **OR** ondansetron (ZOFRAN) IV, sodium chloride flush   Vital Signs    Vitals:   12/25/16 2328 12/26/16 0442 12/26/16 0502 12/26/16 0810  BP: 94/61  104/68 106/72  Pulse: 75  70 70  Resp:    18  Temp: 98 F (36.7 C)  97.9 F (36.6 C) 98.1 F (36.7 C)  TempSrc: Oral  Oral Oral  SpO2:  100%  100%  Weight:   (!) 329 lb 12.8 oz (149.6 kg)   Height:        Intake/Output Summary (Last 24 hours) at 12/26/16 1145 Last data filed at 12/26/16 0811  Gross per 24 hour  Intake              250 ml  Output              445 ml  Net             -195 ml   Filed Weights   12/20/16 0541 12/25/16 0442 12/26/16 0502  Weight: (!) 315 lb 12.8 oz (143.2 kg) (!) 327 lb 13.2 oz (148.7 kg) (!) 329 lb 12.8 oz (149.6 kg)     Physical Exam   General: Well developed, well nourished, male appearing in no acute distress. Head: Normocephalic, atraumatic.  Neck: Supple without bruits, no JVD Lungs:  Resp regular and unlabored, CTA in upper lobes, crackles in bases Heart: Irregular rhythm, regular rate, no murmur; no rub. Abdomen: Soft, non-tender, non-distended with normoactive bowel sounds. No hepatomegaly. No  rebound/guarding. No obvious abdominal masses. Extremities: No clubbing, cyanosis, 1+ edema with erythema and skin tears. Distal pedal pulses are 1+ bilaterally. Neuro: Alert and oriented X 3. Moves all extremities spontaneously. Psych: Normal affect.  Labs    Chemistry Recent Labs Lab 12/24/16 1110 12/25/16 0401 12/26/16 0413  NA 134* 134* 133*  K 4.8 5.0 5.2*  CL 89* 89* 90*  CO2 36* 35* 35*  GLUCOSE 104* 93 106*  BUN 31* 37* 43*  CREATININE 2.24* 3.03* 2.64*  CALCIUM 8.4* 8.5* 8.2*  ALBUMIN  --   --  3.1*  GFRNONAA 30* 21* 25*  GFRAA 35* 24* 28*  ANIONGAP 9 10 8      Hematology Recent Labs Lab 12/24/16 1110 12/25/16 0401 12/26/16 0413  WBC 7.5 6.5 6.6  RBC 3.44* 3.47* 3.37*  3.37*  HGB 10.0* 9.7* 9.5*  HCT 33.1* 33.3* 31.7*  MCV 96.2 96.0 94.1  MCH 29.1 28.0 28.2  MCHC 30.2 29.1* 30.0  RDW 15.2 15.4 15.3  PLT 266 246 263    Cardiac EnzymesNo results for input(s): TROPONINI in the last 168 hours. No results for  input(s): TROPIPOC in the last 168 hours.   BNPNo results for input(s): BNP, PROBNP in the last 168 hours.   DDimer No results for input(s): DDIMER in the last 168 hours.   Radiology    US Renal  Result Date: 12/25/2016 CLINICAL DATA:  61 year old male with acute renal failure EXAM: RENAL / URINARY TRACT ULTRASOUND COMPLETE COMPARISON:  None. FINDINGS: Right Kidney: Length: 11.2 cm. Echogenicity within normal limits. No mass or hydronephrosis visualized. Left Kidney: Length: 10.6 cm. Echogenicity within normal limits. No mass or hydronephrosis visualized. Bladder: The bladder was decompressed. IMPRESSION: Normal renal ultrasound. Electronically Signed   By: Jacqulynn Cadet M.D.   On: 12/25/2016 11:47   Dg Chest Port 1 View  Result Date: 12/24/2016 CLINICAL DATA:  Dyspnea EXAM: PORTABLE CHEST 1 VIEW COMPARISON:  Chest radiograph 12/17/2016 FINDINGS: Left chest wall AICD leads are unchanged in position. Sequelae of median sternotomy. Unchanged  cardiomegaly. Small right pleural effusion with associated atelectasis, unchanged. No new region of consolidation. Unchanged left basilar atelectasis. Persistent pulmonary vascular congestion. IMPRESSION: Unchanged small right pleural effusion and associated atelectasis. Pulmonary vascular congestion and left basilar atelectasis without overt pulmonary edema. Electronically Signed   By: Ulyses Jarred M.D.   On: 12/24/2016 14:52     Telemetry    Rate controlled Afib with PVCs - Personally Reviewed  ECG    No new tracings - Personally Reviewed   Cardiac Studies   Echocardiogram 12/12/16: Study Conclusions - Procedure narrative: Contrast enhancement employed. - Left ventricle: The cavity size was normal. Wall thickness was increased in a pattern of mild LVH. Systolic function was moderately reduced. The estimated ejection fraction was in the range of 35% to 40%. Diffuse hypokinesis. The study was not technically sufficient to allow evaluation of LV diastolic dysfunction due to atrial fibrillation. - Regional wall motion abnormality: Akinesis of the apical septal and apical myocardium; moderate hypokinesis of the basal-mid anteroseptal myocardium. - Aortic valve: Mildly calcified leaflets. - Mitral valve: Moderately calcified annulus. Normal thickness leaflets . - Left atrium: The atrium was mildly dilated. - Right ventricle: Pacer wire or catheter noted in right ventricle. Systolic function was mildly reduced. - Tricuspid valve: There was mild regurgitation. - Pulmonary arteries: PA peak pressure: 38 mm Hg (S). - Systemic veins: IVC is dilated wtih normal respiratory variation. Estimated CVP 8 mmHg.  Patient Profile     61 y.o. male with a history of atrial fibrillation and acute on chronic systolic and diastolic congestive heart failure. He's had recent GI bleeding. Colonoscopy 10/11/18revealed a 12 mm polyp which was removed. He was also found to have  hemorrhoids that were not bleeding. The gastroenterology team has given the okay to restart Eliquis, per cardiology notes on 12/21/16 (not yet started). IM is holding eliquis given his recent fall and complaints of dizziness.  Assessment & Plan    1. Acute on chronic systolic heart failure - overall net negative 13L, urine output has been low for the past 2 days - renal function is now improving: sCr 2.64 (3.03) - per nephrology, restarted lasix 40 mg IV daily today - follow labs   2. Hyperkalemia - K 5.2, not on any potassium supplement - kayexalate per nephrology   3. Afib - rate controlled on amiodarone and coreg - Eliquis originally held for GI bleed - OK per GI to restart eliquis - primary team holding for fall and pt-reported dizziness - given his renal function, will likely not be a candidate for eliquis - will need  to restart coumadin - OK to restart per primary team   4. Dizziness, s/p fall  - PT recommended fall precautions and SNF placement   Signed, Ledora Bottcher , PA-C 11:45 AM 12/26/2016 Pager: [phone]  Personally seen and examined. Agree with above.  No CP. Mild SOB Lungs with mild crackles at bases, obese, alert, Irreg irreg +edema  AFIB  - rate controlled on meds as above  Systolic HF  - IV lasix now back on board. Nephology following as well.   Chronic anticoagulation  - on hold, recent fall with hit to head.   - when comfortable would resume with coumadin, NOT NOAC  Candee Furbish, MD

## 2016-12-26 NOTE — Progress Notes (Signed)
Patient has a bed available at [facility] [place] once medically stable.   Clinical Social Worker spoke with patients niece Aldona Bar via phone. Aldona Bar stated she is agreeable for patient to discharge to [facility] [place]. Patient is agreeable to discharge to a new SNF placement. Patients niece stated she would like to get patient back to [place] but is agreeable to have him go to [place] first and then work on a transfer later down the road. Niece stated she would like to be contacted once patient is ready for discharge.   Rhea Pink, MSW,  [place]

## 2016-12-26 NOTE — Progress Notes (Signed)
PROGRESS NOTE    Darrell Black  IPJ:[id number] DOB: 1955/12/29 DOA: 12/10/2016 PCP: Renata Caprice, DO   Brief Narrative: 61 year old male with past medical history of atrial fibrillation, chronic systolic/diastolic heart failure, diabetes mellitus, morbid obesity and obstructive sleep apnea brought to the emergency room on 9/30 for visual hallucinations 4 days. Initially, patient waiting for transfer to psychiatric facility but over the next 24 hours, developed worsening dyspnea and sedation, possibly from her psychotropic medications. He started to become hypoxic and felt to be in volume overload. Admitted to the hospitalist service at [facility] and placed in stepdown on 10/3.  Patient started on IV diuretics and initially responded well, diuresing over 4 L. Followed by tele-psychiatry.  Following hospitalization, patient started having some mild bleeding from his gums, treated with tranexemic acid and his eliquis was held. He continued to remain hypoxic, requiring BiPAP.  On night of 10/5, patient started having some gross blood per rectum. Remained hemodynamically stable, but hemoglobin dropped from 9.52 days prior to 8.8 by next morning. No hematemesis. PICC line placed 10/6. Patient remained unresponsive on BiPAP. Seen by GI and it was recommended he be transferred to critical care at [facility]. Patient's lack of responsiveness was felt to be secondary to acute psychosis plus hypercapnia plus polypharmacy. By holding sedating medications, patient became more awake by 10/8. Felt to be in acute psychosis and psychiatry consulted. Cardiology also consulted for acute on chronic heart failure.  Transfer back to hospitalist service on 10/9 morning. Cardiology recommended continuing Lasix. Since admission, he is now 34 L diuresed. GI recommended holding eliquis and planning for colonoscopy if unable to get reports previous colonoscopy or rectal bleeding persisted. (Patient continued to  have intermittent episodes of bleeding from stool).  He underwent colonoscopy which showed hemorrhoid, had polypectomy. He was clear by GI to start anticoagulation.   He felt in the hospital and hit his head , had small scalp hematoma. anticoagulation on hold. Need to assess risk for fall.   Also he develops AMS, lethargic, 10-15. Thought to be related to ativan and Sleep apnea. He has improved on BIPAP.  His renal function got worse on IV lasix, bactrim and Toradol. Nephrology consulted. Renal function improved today. Renal managing diuresis.    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  Worsening MS 10-15. Multifactorial, hypercapnia, OSA, sedatives Avoid sedatives.  Improved on BIPAP. Need BIPAP at HS and during the day if sleepy.   Acute on chronic systolic congestive heart failure ([place]):  -Ejection fraction notes EF of 25-30 percent with atrial fibrillation and unable to ascertain diastolic dysfunction. -Cardiology following.  -negtaive 13 L.  -lasix per renal;  Encephalopathy;  He was notice to be more confuse overnight 10-15, he received one time dose of ativan. Avoid sedatives.  Lethargic on 10-15. Improved on  BIPAP. Related to Xanax, hypercapnia.  Improved. Need BIPAP at HS  Dizziness, headaches;  He is complaining of  dizziness and headaches.  Orthostatic negative.  CT head negative.  Plan to get PT evaluation, to assess risk fall.   Major depression with Psychotic feature.  Clear by psych .  Needs to follow up outpatient for medications management.  No antipsychotic medications at this time.  Hold Requip which could cause psychosis.   GI bleed, recta bleeding.  Underwent colonoscopy, showed hemorrhoids, polyp, S/P polypectomy.  Hb stable.   Cellulitis; lower extremities;  On Ancef. Day 5. Doxy change to bactrim due to itchiness.  Received 3 days of bactrim. Total 8 days antibiotics.   Acute renal failure/acute kidney injury; On admission he had renal failure, Secondary to poor perfusion. Creatinine has been steadily improving. Peaked his high as 2.2. Down to 1.36,  Renal function started to get worse again on 10-13: 1.5---1.7----2 today.  Diuretics has been on hold since 10-13 afternoon. He received Toradol overnight, will discontinue Bactrim.  Check UA, renal US>  Renal consulted. Renal order one time dose IV lasix.  Cr decrease today to 2. 6  Afib; holding anticoagulation due to GI bleed.  Hold apixiban due to recent fall, PT evaluation. Also he was having dizziness 10-14. Needs PT evaluation. Now cr at 2.  He will need to be started on Coumadin, assess risk for fall.   Restless leg syndrome: Per Psych  Requip could cause psychosis, plan to hold that medication for now.  Started Gabapentin low dose.   Diabetes mellitus without complication ([place]):? Maybe erroneous diagnosis. A1c at 5.4 and patient not on home medications.  Morbid obesity due to excess calories [facility]) patient meets criteria with BMI greater than 40.  Hypernatremia; monitor on lasix. Resolved.   Hypokalemia; oral potassium supplements.  Fall; CT head negative.  Elevated TSH; free t 3 and Free  t 4 normal.   DVT prophylaxis: SCD. No anticoagulation due to GI bleed  Code Status: full code.  Family Communication: care  discussed with patient.  Disposition Plan: home when stable.    Consultants:   GI  Cardiology    Procedures:  ECHO; Procedure narrative: Contrast enhancement employed. - Left ventricle: The cavity size was normal. Wall thickness was   increased in a pattern of mild LVH. Systolic function was   moderately reduced. The estimated ejection fraction was in the   range of 35% to 40%. Diffuse hypokinesis. The study was not   technically sufficient to allow evaluation of LV diastolic   dysfunction due to atrial fibrillation. - Regional wall motion abnormality: Akinesis of the apical septal   and apical myocardium; moderate hypokinesis of the basal-mid    anteroseptal myocardium.     Antimicrobials:   Ancef   Subjective: He is alert. Ware BIPAP for few hours last night.  Still feeling dizzy    Objective: Vitals:   12/26/16 0442 12/26/16 0502 12/26/16 0810 12/26/16 1229  BP:  104/68 106/72 103/88  Pulse:  70 70 95  Resp:   18 (!) 25  Temp:  97.9 F (36.6 C) 98.1 F (36.7 C) 98.4 F (36.9 C)  TempSrc:  Oral Oral Oral  SpO2: 100%  100% 97%  Weight:  (!) 149.6 kg (329 lb 12.8 oz)    Height:        Intake/Output Summary (Last 24 hours) at 12/26/16 1505 Last data filed at 12/26/16 1246  Gross per 24 hour  Intake  125 ml  Output              545 ml  Net             -420 ml   Filed Weights   12/20/16 0541 12/25/16 0442 12/26/16 0502  Weight: (!) 143.2 kg (315 lb 12.8 oz) (!) 148.7 kg (327 lb 13.2 oz) (!) 149.6 kg (329 lb 12.8 oz)    Examination:  General exam: Alert  Respiratory system: decreased breath sounds.  Cardiovascular system:  S 1, S 2 RRR Gastrointestinal system: BS present, soft, nt Central nervous system: non focal.  Extremities bilateral edema, redness improved      Data Reviewed: I have personally reviewed following labs and imaging studies  CBC:  Recent Labs Lab 12/21/16 0428 12/22/16 1230 12/24/16 1110 12/25/16 0401  12/26/16 0413  WBC 4.7 7.3 7.5 6.5 6.6  HGB 10.0* 10.2* 10.0* 9.7* 9.5*  HCT 33.3* 34.5* 33.1* 33.3* 31.7*  MCV 96.8 95.6 96.2 96.0 94.1  PLT 194 236 266 246 782   Basic Metabolic Panel:  Recent Labs Lab 12/22/16 1230 12/23/16 0349 12/24/16 1110 12/25/16 0401 12/26/16 0413  NA 134* 136 134* 134* 133*  K 4.2 4.5 4.8 5.0 5.2*  CL 88* 91* 89* 89* 90*  CO2 39* 39* 36* 35* 35*  GLUCOSE 132* 123* 104* 93 106*  BUN 19 22* 31* 37* 43*  CREATININE 1.55* 1.78* 2.24* 3.03* 2.64*  CALCIUM 8.2* 8.4* 8.4* 8.5* 8.2*  PHOS  --   --   --   --  4.4   GFR: Estimated Creatinine Clearance: 41.9 mL/min (A) (by C-G formula based on SCr of 2.64 mg/dL (H)). Liver Function Tests:  Recent Labs Lab 12/26/16 0413  ALBUMIN 3.1*   No results for input(s): LIPASE, AMYLASE in the last 168 hours.  Recent Labs Lab 12/24/16 1800  AMMONIA 30   Coagulation Profile: No results for input(s): INR, PROTIME in the last 168 hours. Cardiac Enzymes: No results for input(s): CKTOTAL, CKMB, CKMBINDEX, TROPONINI in the last 168 hours. BNP (last 3 results) No results for input(s): PROBNP in the last 8760 hours. HbA1C: No results for input(s): HGBA1C in the last 72 hours. CBG:  Recent Labs Lab 12/24/16 0638 12/24/16 1123 12/25/16 2118 12/26/16 0610 12/26/16 1140  GLUCAP 96 94 122* 94 105*   Lipid Profile: No results for input(s): CHOL, HDL, LDLCALC, TRIG, CHOLHDL, LDLDIRECT in the last 72 hours. Thyroid Function Tests: No results for input(s): TSH, T4TOTAL, FREET4, T3FREE, THYROIDAB in the last 72 hours. Anemia Panel:  Recent Labs  12/26/16 0413  VITAMINB12 515  FOLATE 7.4  FERRITIN 76  TIBC 293  IRON 20*  RETICCTPCT 0.9   Sepsis Labs:  Recent Labs Lab 12/24/16 1800 12/24/16 2145 12/25/16 0401 12/26/16 0413  PROCALCITON <0.10  --  <0.10 <0.10  LATICACIDVEN 0.7 1.0  --   --     No results found for this or any previous visit (from the past 240 hour(s)).       Radiology  Studies: US Renal  Result Date: 12/25/2016 CLINICAL DATA:  61 year old male with acute renal failure EXAM: RENAL / URINARY TRACT ULTRASOUND COMPLETE COMPARISON:  None. FINDINGS: Right Kidney: Length: 11.2 cm. Echogenicity within normal limits. No mass or hydronephrosis visualized. Left Kidney: Length: 10.6 cm. Echogenicity within normal limits. No mass or hydronephrosis visualized. Bladder: The bladder was decompressed. IMPRESSION: Normal renal ultrasound. Electronically Signed   By: Jacqulynn Cadet M.D.   On: 12/25/2016 11:47  Scheduled Meds: . amiodarone  200 mg Oral Daily  . atorvastatin  80 mg Oral q1800  . bisacodyl  10 mg Rectal Once  . carvedilol  3.125 mg Oral BID WC  . docusate sodium  100 mg Oral BID  . furosemide  40 mg Intravenous Daily  . gabapentin  100 mg Oral BID  . insulin aspart  0-15 Units Subcutaneous TID WC  . senna  1 tablet Oral BID  . sodium chloride flush  10-40 mL Intracatheter Q12H  . sodium polystyrene  30 g Oral Once   Continuous Infusions: . ferumoxytol       LOS: 14 days    Time spent: 35 minutes.     Elmarie Shiley, MD [facility] Pager [phone]  If 7PM-7AM, please contact night-coverage [email or web address] Password [email or web address] 12/26/2016, 3:05 PM

## 2016-12-26 NOTE — Progress Notes (Signed)
S: 61 y.o gentleman with PMHx of HFrEF with AKI due to excessive diuresis. He was little more SOB today, also c/o more abdominal and LE swelling. Cr. Improved after holding diuretics yesterday.  O:BP 106/72 (BP Location: Left Arm)   Pulse 70   Temp 98.1 F (36.7 C) (Oral)   Resp 18   Ht 5\' 8"  (1.727 m)   Wt (!) 329 lb 12.8 oz (149.6 kg)   SpO2 100%   BMI 50.15 kg/m   Intake/Output Summary (Last 24 hours) at 12/26/16 0934 Last data filed at 12/26/16 0811  Gross per 24 hour  Intake              250 ml  Output              720 ml  Net             -470 ml   Intake/Output: I/O last 3 completed shifts: In: 155 [P.O.:125; I.V.:30] Out: 550 [Urine:400; Stool:150]  Intake/Output this shift:  Total I/O In: 125 [P.O.:125] Out: 170 [Urine:170] Weight change: 1 lb 15.6 oz (0.896 kg) PQZ:RAQT developed, obese gentleman, in no acute distress. CVS: RRR Resp: B/L basal crackles from mid to lower zone. Abd: Soft, obese, non tender, BS +. Ext: 2+ edema b/l up to knees with signs of venous congestion.   Recent Labs Lab 12/21/16 0428 12/22/16 1230 12/23/16 0349 12/24/16 1110 12/25/16 0401 12/26/16 0413  NA 139 134* 136 134* 134* 133*  K 3.8 4.2 4.5 4.8 5.0 5.2*  CL 89* 88* 91* 89* 89* 90*  CO2 42* 39* 39* 36* 35* 35*  GLUCOSE 113* 132* 123* 104* 93 106*  BUN 17 19 22* 31* 37* 43*  CREATININE 1.25* 1.55* 1.78* 2.24* 3.03* 2.64*  ALBUMIN  --   --   --   --   --  3.1*  CALCIUM 8.4* 8.2* 8.4* 8.4* 8.5* 8.2*  PHOS  --   --   --   --   --  4.4   Liver Function Tests:  Recent Labs Lab 12/26/16 0413  ALBUMIN 3.1*   No results for input(s): LIPASE, AMYLASE in the last 168 hours.  Recent Labs Lab 12/24/16 1800  AMMONIA 30   CBC:  Recent Labs Lab 12/21/16 0428 12/22/16 1230 12/24/16 1110 12/25/16 0401 12/26/16 0413  WBC 4.7 7.3 7.5 6.5 6.6  HGB 10.0* 10.2* 10.0* 9.7* 9.5*  HCT 33.3* 34.5* 33.1* 33.3* 31.7*  MCV 96.8 95.6 96.2 96.0 94.1  PLT 194 236 266 246 263    Cardiac Enzymes: No results for input(s): CKTOTAL, CKMB, CKMBINDEX, TROPONINI in the last 168 hours. CBG:  Recent Labs Lab 12/23/16 1141 12/24/16 0638 12/24/16 1123 12/25/16 2118 12/26/16 0610  GLUCAP 100* 96 94 122* 94    Iron Studies:  Recent Labs  12/26/16 0413  IRON 20*  TIBC 293  FERRITIN 76   Studies/Results: US Renal  Result Date: 12/25/2016 CLINICAL DATA:  61 year old male with acute renal failure EXAM: RENAL / URINARY TRACT ULTRASOUND COMPLETE COMPARISON:  None. FINDINGS: Right Kidney: Length: 11.2 cm. Echogenicity within normal limits. No mass or hydronephrosis visualized. Left Kidney: Length: 10.6 cm. Echogenicity within normal limits. No mass or hydronephrosis visualized. Bladder: The bladder was decompressed. IMPRESSION: Normal renal ultrasound. Electronically Signed   By: Jacqulynn Cadet M.D.   On: 12/25/2016 11:47   Dg Chest Port 1 View  Result Date: 12/24/2016 CLINICAL DATA:  Dyspnea EXAM: PORTABLE CHEST 1 VIEW COMPARISON:  Chest radiograph 12/17/2016 FINDINGS: Left  chest wall AICD leads are unchanged in position. Sequelae of median sternotomy. Unchanged cardiomegaly. Small right pleural effusion with associated atelectasis, unchanged. No new region of consolidation. Unchanged left basilar atelectasis. Persistent pulmonary vascular congestion. IMPRESSION: Unchanged small right pleural effusion and associated atelectasis. Pulmonary vascular congestion and left basilar atelectasis without overt pulmonary edema. Electronically Signed   By: Ulyses Jarred M.D.   On: 12/24/2016 14:52   . amiodarone  200 mg Oral Daily  . atorvastatin  80 mg Oral q1800  . bisacodyl  10 mg Rectal Once  . carvedilol  3.125 mg Oral BID WC  . docusate sodium  100 mg Oral BID  . gabapentin  100 mg Oral BID  . insulin aspart  0-15 Units Subcutaneous TID WC  . senna  1 tablet Oral BID  . sodium chloride flush  10-40 mL Intracatheter Q12H  . sodium polystyrene  30 g Oral Once     BMET    Component Value Date/Time   NA 133 (L) 12/26/2016 0413   K 5.2 (H) 12/26/2016 0413   CL 90 (L) 12/26/2016 0413   CO2 35 (H) 12/26/2016 0413   GLUCOSE 106 (H) 12/26/2016 0413   BUN 43 (H) 12/26/2016 0413   CREATININE 2.64 (H) 12/26/2016 0413   CALCIUM 8.2 (L) 12/26/2016 0413   GFRNONAA 25 (L) 12/26/2016 0413   GFRAA 28 (L) 12/26/2016 0413   CBC    Component Value Date/Time   WBC 6.6 12/26/2016 0413   RBC 3.37 (L) 12/26/2016 0413   RBC 3.37 (L) 12/26/2016 0413   HGB 9.5 (L) 12/26/2016 0413   HCT 31.7 (L) 12/26/2016 0413   PLT 263 12/26/2016 0413   MCV 94.1 12/26/2016 0413   MCH 28.2 12/26/2016 0413   MCHC 30.0 12/26/2016 0413   RDW 15.3 12/26/2016 0413   LYMPHSABS 0.6 (L) 12/17/2016 0848   MONOABS 0.7 12/17/2016 0848   EOSABS 0.3 12/17/2016 0848   BASOSABS 0.0 12/17/2016 0848     Assessment/Plan:  1. AKI. Most likely due to cardiorenal syndrome secondary to aggressive dialysis in a short period of time. Cr. Improved today after holding diuretics yesterday but has become more SOB and worsening of pulmonary and LE edema.      -restart Lasix at 40 mg IV daily.      - Continue monitoring renal function.  2. Hyperkalemia. Potassium was little elevated at 5.2 today. One dose of Kayexalate 30 mg ordered.  3.  Anemia. Iron studies were more consistent with iron deficiency. CK D is also contributing. - Replace iron with Feraheme 2 doses 3 days apart.  4. HFrEF. Cardiology is following,we will restart gentle diuresis today.  5. A Fib. Rate controlled on amiodarone and Coreg.Eliquis still on hold due to GI bleed.  6. OSA. Continue with BiPAP at night.  I have seen and examined this patient and agree with plan and assessment in the above note with renal recommendations/intervention highlighted.  Will restart IV lasix but at lower dose of 40mg  and follow I's/O's and daily Scr. Darrell Black Darrell Bertini,MD 12/26/2016 12:15 PM   Darrell Potts, MD [facility] [phone]

## 2016-12-27 DIAGNOSIS — K625 Hemorrhage of anus and rectum: Secondary | ICD-10-CM

## 2016-12-27 DIAGNOSIS — G2581 Restless legs syndrome: Secondary | ICD-10-CM

## 2016-12-27 LAB — CBC
HEMATOCRIT: 33.4 % — AB (ref 39.0–52.0)
HEMOGLOBIN: 9.8 g/dL — AB (ref 13.0–17.0)
MCH: 28.2 pg (ref 26.0–34.0)
MCHC: 29.3 g/dL — ABNORMAL LOW (ref 30.0–36.0)
MCV: 96 fL (ref 78.0–100.0)
Platelets: 274 10*3/uL (ref 150–400)
RBC: 3.48 MIL/uL — AB (ref 4.22–5.81)
RDW: 15.2 % (ref 11.5–15.5)
WBC: 5 10*3/uL (ref 4.0–10.5)

## 2016-12-27 LAB — RENAL FUNCTION PANEL
ALBUMIN: 3.3 g/dL — AB (ref 3.5–5.0)
Anion gap: 10 (ref 5–15)
BUN: 38 mg/dL — ABNORMAL HIGH (ref 6–20)
CALCIUM: 8.6 mg/dL — AB (ref 8.9–10.3)
CO2: 35 mmol/L — AB (ref 22–32)
CREATININE: 2.05 mg/dL — AB (ref 0.61–1.24)
Chloride: 90 mmol/L — ABNORMAL LOW (ref 101–111)
GFR calc non Af Amer: 33 mL/min — ABNORMAL LOW (ref >60)
GFR, EST AFRICAN AMERICAN: 39 mL/min — AB (ref >60)
GLUCOSE: 107 mg/dL — AB (ref 65–99)
Phosphorus: 4.5 mg/dL (ref 2.5–4.6)
Potassium: 4.8 mmol/L (ref 3.5–5.1)
SODIUM: 135 mmol/L (ref 135–145)

## 2016-12-27 LAB — GLUCOSE, CAPILLARY
GLUCOSE-CAPILLARY: 136 mg/dL — AB (ref 65–99)
GLUCOSE-CAPILLARY: 116 mg/dL — AB (ref 65–99)
Glucose-Capillary: 91 mg/dL (ref 65–99)
Glucose-Capillary: 121 mg/dL — ABNORMAL HIGH (ref 65–99)

## 2016-12-27 MED ORDER — ROPINIROLE HCL 1 MG PO TABS: 0.5000 mg | ORAL_TABLET | Freq: Every day | ORAL | Status: DC

## 2016-12-27 MED ORDER — ROPINIROLE HCL 1 MG PO TABS
1.0000 mg | ORAL_TABLET | Freq: Every day | ORAL | Status: DC
  Administered 2016-12-27 – 2016-12-30 (×4): 1 mg via ORAL
  Filled 2016-12-27 (×4): qty 1

## 2016-12-27 NOTE — Progress Notes (Signed)
Physical Therapy Treatment Patient Details Name: Darrell Black MRN: [id number] DOB: [birth year] Today's Date: 12/27/2016    History of Present Illness 61 year old male with past medical history of atrial fibrillation, chronic systolic/diastolic heart failure, diabetes mellitus, morbid obesity and obstructive sleep apnea brought to the emergency room on 9/30 for visual hallucinations 4 days. Initially, patient waiting for transfer to psychiatric facility but over the next 24 hours, developed worsening dyspnea and sedation.     PT Comments    Patient continues to demonstrate decreased activity tolerance and required 4-6L O2 via [place] for mobility. Pt is making gradual progress and tolerated an increase in gait distance this session with standing rest breaks but no seated rest breaks needed. Current plan remains appropriate.    Follow Up Recommendations  SNF     Equipment Recommendations  None recommended by PT    Recommendations for Other Services       Precautions / Restrictions Precautions Precautions: Fall Restrictions Weight Bearing Restrictions: No    Mobility  Bed Mobility               General bed mobility comments: pt sitting EOB upon arrival  Transfers Overall transfer level: Needs assistance Equipment used: Rolling walker (2 wheeled) Transfers: Sit to/from Stand Sit to Stand: Min guard Stand pivot transfers: Min guard       General transfer comment: min guard for safety; cues for safe hand placement  Ambulation/Gait Ambulation/Gait assistance: Min guard Ambulation Distance (Feet): 150 Feet Assistive device: Rolling walker (2 wheeled) Gait Pattern/deviations: Step-through pattern;Decreased stride length;Trunk flexed Gait velocity: decreased   General Gait Details: cues for posture and breathing technique; pt with SOB and required 2 standing rest breaks   Stairs            Wheelchair Mobility    Modified Rankin (Stroke Patients Only)        Balance Overall balance assessment: Needs assistance Sitting-balance support: No upper extremity supported;Feet supported Sitting balance-Leahy Scale: Good     Standing balance support: Bilateral upper extremity supported;During functional activity Standing balance-Leahy Scale: Poor Standing balance comment: heavy reliance on RW                            Cognition Arousal/Alertness: Awake/alert Behavior During Therapy: WFL for tasks assessed/performed;Anxious Overall Cognitive Status: Within Functional Limits for tasks assessed                                        Exercises      General Comments        Pertinent Vitals/Pain Pain Assessment: Faces Faces Pain Scale: No hurt    Home Living                      Prior Function            PT Goals (current goals can now be found in the care plan section) Acute Rehab PT Goals Patient Stated Goal: to [place]  PT Goal Formulation: With patient Time For Goal Achievement: 01/04/17 Potential to Achieve Goals: Good Progress towards PT goals: Progressing toward goals    Frequency    Min 3X/week      PT Plan Current plan remains appropriate    Co-evaluation              AM-PAC PT "6 Clicks" Daily Activity  Outcome Measure  Difficulty turning over in bed (including adjusting bedclothes, sheets and blankets)?: A Little Difficulty moving from lying on back to sitting on the side of the bed? : A Lot Difficulty sitting down on and standing up from a chair with arms (e.g., wheelchair, bedside commode, etc,.)?: A Little Help needed moving to and from a bed to chair (including a wheelchair)?: A Little Help needed walking in hospital room?: A Little Help needed climbing 3-5 steps with a railing? : A Little 6 Click Score: 17    End of Session Equipment Utilized During Treatment: Gait belt;Oxygen Activity Tolerance: Patient tolerated treatment well Patient left: in  chair;with call bell/phone within reach Nurse Communication: Mobility status PT Visit Diagnosis: Other abnormalities of gait and mobility (R26.89);Muscle weakness (generalized) (M62.81)     Time: 7948-0165 PT Time Calculation (min) (ACUTE ONLY): 38 min  Charges:  $Gait Training: 23-37 mins $Therapeutic Activity: 8-22 mins                    G Codes:       Earney Navy, PTA Pager: [phone]     Darliss Cheney 12/27/2016, 4:14 PM

## 2016-12-27 NOTE — Progress Notes (Signed)
RT went to place pt on Bipap pt refused at this time.  PT stated he will tell RN when ready for Bipap.

## 2016-12-27 NOTE — Progress Notes (Signed)
PROGRESS NOTE    Darrell Black  FBP:[id number] DOB: 1956/01/20 DOA: 12/10/2016 PCP: Renata Caprice, DO    Brief Narrative:  61 year old male who presented with dyspnea. He does have significant past medical history of atrial fibrillation, heart failure, coronary artery disease, ischemic cardiomyopathy, type 2 diabetes mellitus, dyslipidemia, hypertension, morbid obesity, chronic kidney disease and depression. Reported worsening hallucinations for last 4 days prior to hospitalization, his psychotropic agents have been modified as an outpatient, he developed acute dyspnea about 12 hours prior to admission. On his initial physical examination blood pressure 111/72, heart rate 85, respiratory rate 23 to 28, oxygen saturation 89-94%. He was pale, multiple mucosal bleeding abrasion, decreased breath sounds at bases bilaterally, heart S1-S2 present irregularly irregular, obese abdomen, no lower extremity edema. Patient was somnolent but easily aroused. Arterial blood gas 7.32/ 84.2/ 59.7/ 37.8/ 85.2%, on 4 L per nasal. Chest x-ray with hyperinflation, right rotation, positive cardiomegaly, small bilateral pleural effusions and vascular congestion. EKG with atrial fibrillation, poor R-wave progression, left bundle-branch block, occasional ventricular pacing.   Patient was admitted to the hospital working diagnosis acute hypoxic/hypercapnic respiratory failure due to cardiogenic pulmonary edema due to acute on chronic decompensated systolic heart failure.   Patient required noninvasive mechanical ventilation, aggressive diureses. This hospitalization was complicated by lower GI bleed, and psychosis..  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 Acute hypoxic/hypercapnic respiratory failure. Patient more awake and alert, has been on Bipap 20/10, reports improved dyspnea. Serum bicarbonate 43 to 28, patient likely has chronic respiratory acidosis.   2. Metabolic/toxic encephalopathy. Clinically has improved, will continue to hold on psychotropic. Will continue to hold on ropinarole, medication can cause psychosis.  3. Acute on chronic systolic heart failure decompensation. Improved volume status, urine output 1570 cc over last 24 hours, negative 13, 298 since admission. Will continue with 40 mg of IV furosemide daily and carvedilol.   4. Cellulitis, lower extremities. Rash has improved, will hold on antibiotic therapy for now.   5. Acute kidney injury on chronic kidney disease. Will continue close follow up on renal function and electrolytes, will continue furosemide 40 mg IV daily.  6. Chronic atrial fibrillation. Will continue rate control with amiodarone and coreg. Holding on anticoagulation for now due to recent bleeding.    7. Diabetes mellitus type 2. Will continue glucose cover and monitoring  8. Morbid obesity. Limited mobility, will need physical therapy evaluation.   9. Lower GI bleed. Colonoscopy with positive polyp, removed.    DVT prophylaxis: scd  Code Status: full Family Communication: no family at the bedside Disposition Plan: snf   Consultants:   Cardiology  Nephrology  Procedures:     Antimicrobials:       Subjective: Dyspnea has improved, no chest pain, no nausea or vomiting, still complains of restless legs at night. Positive weakness and deconditioning.   Objective: Vitals:   12/27/16 0004  12/27/16 0402  12/27/16 0425 12/27/16 0813  BP: 125/78  123/71 108/74  Pulse: 92  70 70  Resp: (!) 33 20    Temp: 97.9 F (36.6 C)  97.6 F (36.4 C)   TempSrc: Oral  Oral   SpO2: 100% 98%    Weight:   (!) 149.2 kg (328 lb 14.4 oz)   Height:        Intake/Output Summary (Last 24 hours) at 12/27/16 1020 Last data filed at 12/27/16 0843  Gross per 24 hour  Intake              380 ml  Output             1675 ml  Net            -1295 ml   Filed Weights   12/25/16 0442 12/26/16 0502 12/27/16 0425  Weight: (!) 148.7 kg (327 lb 13.2 oz) (!) 149.6 kg (329 lb 12.8 oz) (!) 149.2 kg (328 lb 14.4 oz)    Examination:   General: Not in pain or dyspnea, deconditioned Neurology: Awake and alert, non focal  E ENT: no pallor, no icterus, oral mucosa moist Cardiovascular: No JVD. S1-S2 present, rhythmic, no gallops, rubs, or murmurs. ++ lower extremity edema. Pulmonary: vesicular breath sounds bilaterally, adequate air movement, no wheezing, rhonchi. Bibasilar rales.  Gastrointestinal. Abdomen protuberant, no organomegaly, non tender, no rebound or guarding Skin. No rashes Musculoskeletal: no joint deformities     Data Reviewed: I have personally reviewed following labs and imaging studies  CBC:  Recent Labs Lab 12/22/16 1230 12/24/16 1110 12/25/16 0401 12/26/16 0413 12/27/16 0525  WBC 7.3 7.5 6.5 6.6 5.0  HGB 10.2* 10.0* 9.7* 9.5* 9.8*  HCT 34.5* 33.1* 33.3* 31.7* 33.4*  MCV 95.6 96.2 96.0 94.1 96.0  PLT 236 266 246 263 308   Basic Metabolic Panel:  Recent Labs Lab 12/23/16 0349 12/24/16 1110 12/25/16 0401 12/26/16 0413 12/27/16 0525  NA 136 134* 134* 133* 135  K 4.5 4.8 5.0 5.2* 4.8  CL 91* 89* 89* 90* 90*  CO2 39* 36* 35* 35* 35*  GLUCOSE 123* 104* 93 106* 107*  BUN 22* 31* 37* 43* 38*  CREATININE 1.78* 2.24* 3.03* 2.64* 2.05*  CALCIUM 8.4* 8.4* 8.5* 8.2* 8.6*  PHOS  --   --   --  4.4 4.5   GFR: Estimated Creatinine Clearance: 53.9 mL/min (A) (by C-G formula  based on SCr of 2.05 mg/dL (H)). Liver Function Tests:  Recent Labs Lab 12/26/16 0413 12/27/16 0525  ALBUMIN 3.1* 3.3*   No results for input(s): LIPASE, AMYLASE in the last 168 hours.  Recent Labs Lab 12/24/16 1800  AMMONIA 30   Coagulation Profile: No results for input(s): INR, PROTIME in the last 168 hours. Cardiac Enzymes: No results for input(s): CKTOTAL, CKMB, CKMBINDEX, TROPONINI in the last 168 hours. BNP (last 3 results) No results for input(s): PROBNP in the last 8760 hours. HbA1C: No results for input(s): HGBA1C in the last 72 hours. CBG:  Recent Labs Lab 12/26/16 0610 12/26/16 1140 12/26/16 1633 12/26/16 2042 12/27/16 0633  GLUCAP 94 105* 110* 137* 91   Lipid Profile: No results for input(s): CHOL, HDL, LDLCALC, TRIG, CHOLHDL, LDLDIRECT in the last 72 hours. Thyroid Function Tests: No results for input(s): TSH, T4TOTAL, FREET4, T3FREE, THYROIDAB in the last 72 hours. Anemia Panel:  Recent Labs  12/26/16 0413  VITAMINB12 515  FOLATE 7.4  FERRITIN 76  TIBC 293  IRON 20*  RETICCTPCT 0.9  Radiology Studies: I have reviewed all of the imaging during this hospital visit personally     Scheduled Meds: . amiodarone  200 mg Oral Daily  . atorvastatin  80 mg Oral q1800  . bisacodyl  10 mg Rectal Once  . carvedilol  3.125 mg Oral BID WC  . docusate sodium  100 mg Oral BID  . furosemide  40 mg Intravenous Daily  . gabapentin  100 mg Oral BID  . insulin aspart  0-15 Units Subcutaneous TID WC  . senna  1 tablet Oral BID  . sodium chloride flush  10-40 mL Intracatheter Q12H  . sodium polystyrene  30 g Oral Once   Continuous Infusions: . ferumoxytol Stopped (12/26/16 1400)     LOS: 15 days        Lincoln Ginley Gerome Apley, MD [facility] Pager [phone]

## 2016-12-27 NOTE — Progress Notes (Signed)
Pt refuse Bipap 3x (2200, 2314, 0177); SpO2 92, RR 22.

## 2016-12-27 NOTE — Progress Notes (Signed)
Progress Note  Patient Name: Darrell Black Date of Encounter: 12/27/2016  Primary Cardiologist: Vernon Prey Pasi at [facility] here  Subjective   Perhaps his breathing is a little better. No chest pain. No falls.  Inpatient Medications    Scheduled Meds: . amiodarone  200 mg Oral Daily  . atorvastatin  80 mg Oral q1800  . bisacodyl  10 mg Rectal Once  . carvedilol  3.125 mg Oral BID WC  . docusate sodium  100 mg Oral BID  . furosemide  40 mg Intravenous Daily  . gabapentin  100 mg Oral BID  . insulin aspart  0-15 Units Subcutaneous TID WC  . senna  1 tablet Oral BID  . sodium chloride flush  10-40 mL Intracatheter Q12H  . sodium polystyrene  30 g Oral Once   Continuous Infusions: . ferumoxytol Stopped (12/26/16 1400)   PRN Meds: acetaminophen, albuterol, metoprolol tartrate, nitroGLYCERIN, ondansetron **OR** ondansetron (ZOFRAN) IV, sodium chloride flush   Vital Signs    Vitals:   12/27/16 0004 12/27/16 0402 12/27/16 0425 12/27/16 0813  BP: 125/78  123/71 108/74  Pulse: 92  70 70  Resp: (!) 33 20    Temp: 97.9 F (36.6 C)  97.6 F (36.4 C)   TempSrc: Oral  Oral   SpO2: 100% 98%    Weight:   (!) 328 lb 14.4 oz (149.2 kg)   Height:        Intake/Output Summary (Last 24 hours) at 12/27/16 1045 Last data filed at 12/27/16 0843  Gross per 24 hour  Intake              380 ml  Output             1675 ml  Net            -1295 ml   Filed Weights   12/25/16 0442 12/26/16 0502 12/27/16 0425  Weight: (!) 327 lb 13.2 oz (148.7 kg) (!) 329 lb 12.8 oz (149.6 kg) (!) 328 lb 14.4 oz (149.2 kg)    Telemetry    Atrial fibrillation under good rate control - Personally Reviewed  ECG    Atrial fibrillation - Personally Reviewed  Physical Exam   GEN: No acute distress.  Obese Neck: No JVD Cardiac: RRR, no murmurs, rubs, or gallops.  Respiratory: Clear to auscultation bilaterally except for mild crackles at bases. GI: Soft, nontender, non-distended  MS: No  edema; No deformity. Significant lower extremity edema Neuro:  Nonfocal  Psych: Normal affect   Labs    Chemistry Recent Labs Lab 12/25/16 0401 12/26/16 0413 12/27/16 0525  NA 134* 133* 135  K 5.0 5.2* 4.8  CL 89* 90* 90*  CO2 35* 35* 35*  GLUCOSE 93 106* 107*  BUN 37* 43* 38*  CREATININE 3.03* 2.64* 2.05*  CALCIUM 8.5* 8.2* 8.6*  ALBUMIN  --  3.1* 3.3*  GFRNONAA 21* 25* 33*  GFRAA 24* 28* 39*  ANIONGAP 10 8 10      Hematology Recent Labs Lab 12/25/16 0401 12/26/16 0413 12/27/16 0525  WBC 6.5 6.6 5.0  RBC 3.47* 3.37*  3.37* 3.48*  HGB 9.7* 9.5* 9.8*  HCT 33.3* 31.7* 33.4*  MCV 96.0 94.1 96.0  MCH 28.0 28.2 28.2  MCHC 29.1* 30.0 29.3*  RDW 15.4 15.3 15.2  PLT 246 263 274    Cardiac EnzymesNo results for input(s): TROPONINI in the last 168 hours. No results for input(s): TROPIPOC in the last 168 hours.   BNPNo results for input(s): BNP,  PROBNP in the last 168 hours.   DDimer No results for input(s): DDIMER in the last 168 hours.   Radiology    US Renal  Result Date: 12/25/2016 CLINICAL DATA:  61 year old male with acute renal failure EXAM: RENAL / URINARY TRACT ULTRASOUND COMPLETE COMPARISON:  None. FINDINGS: Right Kidney: Length: 11.2 cm. Echogenicity within normal limits. No mass or hydronephrosis visualized. Left Kidney: Length: 10.6 cm. Echogenicity within normal limits. No mass or hydronephrosis visualized. Bladder: The bladder was decompressed. IMPRESSION: Normal renal ultrasound. Electronically Signed   By: Jacqulynn Cadet M.D.   On: 12/25/2016 11:47    Cardiac Studies   EF 35-40  Patient Profile     61 y.o. male with dilated cardiopathy, acute on chronic systolic heart failure, atrial fibrillation rate controlled, GI bleeding resulting in discontinuation of anticoagulation, fall in the hospital with acute kidney injury creatinine up to 3, has had instances of this in the past, slowly improving.  Assessment & Plan    Acute on chronic  systolic/diastolic heart failure -He is back on Lasix 40 mg IV once a day. We will continue. He still has quite a bit of third spacing noted in his lower extremities and some crackles in his bases. Despite his hypercarbic respiratory failure the other evening, I think diuresis continuation makes sense. Nephrology is closely watching as well.  Acute kidney injury -Mildly improved creatinine. We will continue to monitor. He has had massive diuresis on the whole since his hospitalization start.  Atrial fibrillation permanent -Overall well rate controlled with multiple agents including amiodarone. -At this time avoiding anticoagulation given his recent GI bleed as well as recent fall resulting of head injury. When anticoagulation is decided to resume, we recommend warfarin. Try to avoid Eliquis as he was previously on given his wild swings in renal function.  Hyperkalemia -Improved post Kayexalate per nephrology.   For questions or updates, please contact [place] Please consult [email or web address] for contact info under Cardiology/STEMI.      Signed, Candee Furbish, MD  12/27/2016, 10:45 AM

## 2016-12-27 NOTE — Progress Notes (Signed)
S: 61 y.o gentleman with PMHx of HFrEF with AKI due to excessive diuresis.he was feeling better when seen this morning. Shortness of breath was little improved, as well as his lower extremity swelling. Creatinine continued to improve with gentle diuresis.  O:BP 108/74   Pulse 70   Temp 97.6 F (36.4 C) (Oral)   Resp 20   Ht 5\' 8"  (1.727 m)   Wt (!) 328 lb 14.4 oz (149.2 kg)   SpO2 98%   BMI 50.01 kg/m   Intake/Output Summary (Last 24 hours) at 12/27/16 1122 Last data filed at 12/27/16 1100  Gross per 24 hour  Intake              380 ml  Output             1875 ml  Net            -1495 ml   Intake/Output: I/O last 3 completed shifts: In: 125 [P.O.:125] Out: 1845 [Urine:1695; Stool:150]  Intake/Output this shift:  Total I/O In: 380 [P.O.:350; I.V.:30] Out: 475 [Urine:475] Weight change: -14.4 oz (-0.408 kg) Gen: well-developed, obese gentleman, sitting comfortably in chair, in no acute distress. CVS: regular rate and rhythm. Resp: decreased air entry with few basal crackles. Abd: soft, obese, non tender, bowel sounds positive. Ext: 2+ edema b/l up to mid legs with signs of venous congestion,seems improved as compared to yesterday.   Recent Labs Lab 12/21/16 0428 12/22/16 1230 12/23/16 0349 12/24/16 1110 12/25/16 0401 12/26/16 0413 12/27/16 0525  NA 139 134* 136 134* 134* 133* 135  K 3.8 4.2 4.5 4.8 5.0 5.2* 4.8  CL 89* 88* 91* 89* 89* 90* 90*  CO2 42* 39* 39* 36* 35* 35* 35*  GLUCOSE 113* 132* 123* 104* 93 106* 107*  BUN 17 19 22* 31* 37* 43* 38*  CREATININE 1.25* 1.55* 1.78* 2.24* 3.03* 2.64* 2.05*  ALBUMIN  --   --   --   --   --  3.1* 3.3*  CALCIUM 8.4* 8.2* 8.4* 8.4* 8.5* 8.2* 8.6*  PHOS  --   --   --   --   --  4.4 4.5   Liver Function Tests:  Recent Labs Lab 12/26/16 0413 12/27/16 0525  ALBUMIN 3.1* 3.3*   No results for input(s): LIPASE, AMYLASE in the last 168 hours.  Recent Labs Lab 12/24/16 1800  AMMONIA 30   CBC:  Recent Labs Lab  12/22/16 1230 12/24/16 1110 12/25/16 0401 12/26/16 0413 12/27/16 0525  WBC 7.3 7.5 6.5 6.6 5.0  HGB 10.2* 10.0* 9.7* 9.5* 9.8*  HCT 34.5* 33.1* 33.3* 31.7* 33.4*  MCV 95.6 96.2 96.0 94.1 96.0  PLT 236 266 246 263 274   Cardiac Enzymes: No results for input(s): CKTOTAL, CKMB, CKMBINDEX, TROPONINI in the last 168 hours. CBG:  Recent Labs Lab 12/26/16 0610 12/26/16 1140 12/26/16 1633 12/26/16 2042 12/27/16 0633  GLUCAP 94 105* 110* 137* 91    Iron Studies:  Recent Labs  12/26/16 0413  IRON 20*  TIBC 293  FERRITIN 76   Studies/Results: US Renal  Result Date: 12/25/2016 CLINICAL DATA:  61 year old male with acute renal failure EXAM: RENAL / URINARY TRACT ULTRASOUND COMPLETE COMPARISON:  None. FINDINGS: Right Kidney: Length: 11.2 cm. Echogenicity within normal limits. No mass or hydronephrosis visualized. Left Kidney: Length: 10.6 cm. Echogenicity within normal limits. No mass or hydronephrosis visualized. Bladder: The bladder was decompressed. IMPRESSION: Normal renal ultrasound. Electronically Signed   By: Jacqulynn Cadet M.D.   On: 12/25/2016  11:47   . amiodarone  200 mg Oral Daily  . atorvastatin  80 mg Oral q1800  . bisacodyl  10 mg Rectal Once  . carvedilol  3.125 mg Oral BID WC  . docusate sodium  100 mg Oral BID  . furosemide  40 mg Intravenous Daily  . gabapentin  100 mg Oral BID  . insulin aspart  0-15 Units Subcutaneous TID WC  . rOPINIRole  0.5 mg Oral QHS  . senna  1 tablet Oral BID  . sodium chloride flush  10-40 mL Intracatheter Q12H  . sodium polystyrene  30 g Oral Once    BMET    Component Value Date/Time   NA 135 12/27/2016 0525   K 4.8 12/27/2016 0525   CL 90 (L) 12/27/2016 0525   CO2 35 (H) 12/27/2016 0525   GLUCOSE 107 (H) 12/27/2016 0525   BUN 38 (H) 12/27/2016 0525   CREATININE 2.05 (H) 12/27/2016 0525   CALCIUM 8.6 (L) 12/27/2016 0525   GFRNONAA 33 (L) 12/27/2016 0525   GFRAA 39 (L) 12/27/2016 0525   CBC    Component Value  Date/Time   WBC 5.0 12/27/2016 0525   RBC 3.48 (L) 12/27/2016 0525   HGB 9.8 (L) 12/27/2016 0525   HCT 33.4 (L) 12/27/2016 0525   PLT 274 12/27/2016 0525   MCV 96.0 12/27/2016 0525   MCH 28.2 12/27/2016 0525   MCHC 29.3 (L) 12/27/2016 0525   RDW 15.2 12/27/2016 0525   LYMPHSABS 0.6 (L) 12/17/2016 0848   MONOABS 0.7 12/17/2016 0848   EOSABS 0.3 12/17/2016 0848   BASOSABS 0.0 12/17/2016 0848     Assessment/Plan:  1. AKI. Most likely due to cardiorenal syndrome secondary to aggressive dialysis in [place] shortperiod of time. Creatinine continued to improve with restart of gentle diuresis.His respiratory status and lower extremity swelling is improving, he is making good amount of urine.      - Continue Lasix 40 mg IV daily.      - continue monitoring renal functions.  2. Hyperkalemia. Resolved with Kayexalate.   3.  Anemia.. Hemoglobin stable, did had his 1 dose of Feraheme yesterday. He will need another dose next week, it can be given after 3 days.  4. HFrEF. Cardiology is following,they agreed to keep gentle diuresis.  5. Black Fib. Rate controlled on amiodarone and Coreg.Eliquis still on hold due to GI bleed.  6. OSA. Continue with BiPAP at night.  Donetta Potts, MD [facility] [phone]  I have seen and examined this patient and agree with plan and assessment in the above note with renal recommendations/intervention highlighted.  Improving Scr and volume with lasix 40mg  iv q day continue with this regimen and follow. Darrell Black Darrell Ney,MD 12/27/2016 12:22 PM

## 2016-12-27 NOTE — Progress Notes (Signed)
RT went to patients room twice to apply Bipap machine patient has refused.  Patient told to call when ready.

## 2016-12-28 LAB — BASIC METABOLIC PANEL
ANION GAP: 11 (ref 5–15)
BUN: 34 mg/dL — ABNORMAL HIGH (ref 6–20)
CHLORIDE: 93 mmol/L — AB (ref 101–111)
CO2: 32 mmol/L (ref 22–32)
Calcium: 8.6 mg/dL — ABNORMAL LOW (ref 8.9–10.3)
Creatinine, Ser: 1.6 mg/dL — ABNORMAL HIGH (ref 0.61–1.24)
GFR, EST AFRICAN AMERICAN: 52 mL/min — AB (ref >60)
GFR, EST NON AFRICAN AMERICAN: 45 mL/min — AB (ref >60)
Glucose, Bld: 107 mg/dL — ABNORMAL HIGH (ref 65–99)
POTASSIUM: 4.9 mmol/L (ref 3.5–5.1)
SODIUM: 136 mmol/L (ref 135–145)

## 2016-12-28 LAB — GLUCOSE, CAPILLARY
GLUCOSE-CAPILLARY: 106 mg/dL — AB (ref 65–99)
Glucose-Capillary: 123 mg/dL — ABNORMAL HIGH (ref 65–99)
Glucose-Capillary: 151 mg/dL — ABNORMAL HIGH (ref 65–99)
Glucose-Capillary: 138 mg/dL — ABNORMAL HIGH (ref 65–99)

## 2016-12-28 MED ORDER — ZOLPIDEM TARTRATE 5 MG PO TABS
5.0000 mg | ORAL_TABLET | Freq: Once | ORAL | Status: AC
  Administered 2016-12-28: 5 mg via ORAL
  Filled 2016-12-28 (×2): qty 1

## 2016-12-28 MED ORDER — WARFARIN SODIUM 2.5 MG PO TABS
2.5000 mg | ORAL_TABLET | Freq: Once | ORAL | Status: AC
  Administered 2016-12-28: 2.5 mg via ORAL
  Filled 2016-12-28: qty 1

## 2016-12-28 MED ORDER — SODIUM CHLORIDE 0.9 % IV SOLN
510.0000 mg | Freq: Once | INTRAVENOUS | Status: DC
  Filled 2016-12-28: qty 17

## 2016-12-28 MED ORDER — WARFARIN - PHARMACIST DOSING INPATIENT
Freq: Every day | Status: DC
  Administered 2016-12-28: 17:00:00

## 2016-12-28 MED ORDER — ZOLPIDEM TARTRATE 5 MG PO TABS
5.0000 mg | ORAL_TABLET | Freq: Once | ORAL | Status: AC
  Administered 2016-12-29: 5 mg via ORAL
  Filled 2016-12-28: qty 1

## 2016-12-28 NOTE — Progress Notes (Signed)
PROGRESS NOTE    Cuthbert Turton  ZOX:[id number] DOB: 12/09/1955 DOA: 12/10/2016 PCP: Renata Caprice, DO    Brief Narrative:  61 year old male who presented with dyspnea. He does have significant past medical history of atrial fibrillation, heart failure, coronary artery disease, ischemic cardiomyopathy, type 2 diabetes mellitus, dyslipidemia, hypertension, morbid obesity, chronic kidney disease and depression. Reported worsening hallucinations for last 4 days prior to hospitalization, his psychotropic agents have been modified as an outpatient, he developed acute dyspnea about 12 hours prior to admission. On his initial physical examination blood pressure 111/72, heart rate 85, respiratory rate 23 to 28, oxygen saturation 89-94%. He was pale, multiple mucosal bleeding abrasion, decreased breath sounds at bases bilaterally, heart S1-S2 present irregularly irregular, obese abdomen, no lower extremity edema. Patient was somnolent but easily aroused. Arterial blood gas 7.32/ 84.2/ 59.7/ 37.8/ 85.2%, on 4 L per nasal. Chest x-ray with hyperinflation, right rotation, positive cardiomegaly, small bilateral pleural effusions and vascular congestion. EKG with atrial fibrillation, poor R-wave progression, left bundle-branch block, occasional ventricular pacing.   Patient was admitted to the hospital working diagnosis acute hypoxic/hypercapnic respiratory failure due to cardiogenic pulmonary edema due to acute on chronic decompensated systolic heart failure.   Patient required noninvasive mechanical ventilation, aggressive diureses. This hospitalization was complicated by lower GI bleed, and psychosis.   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 Acute hypoxic/hypercapnic respiratory failure. Patient has been using intermittently bipap. Dyspnea and mentation continue to improve. Serum bicarbonate at  32.   2. Metabolic/toxic encephalopathy. Patient awake and alert. Had ropinarole last night with good toleration, will continue neuro checks per unit protocol.  3. Acute on chronic systolic heart failure decompensation. Urine output 1525 cc over last 24 hours, tolerating well 40 mg of IV furosemide daily and carvedilol. Will target negative fluid balance, possible transition to po furosemide in the next 24 hours in preparation for discharge.   4. Cellulitis, lower extremities. Resolved, no antibiotic therapy.   5. Acute kidney injury on chronic kidney disease stage 3. Renal function with improvement in serum cr down to 1,60, with K at 4,9. Will continue IV loop diuretic and will follow renal panel in am.    6. Chronic atrial fibrillation. Rate control with amiodarone and coreg, heart rate in the 70's. Will continue to holding on anticoagulation for now due to recent bleeding.    7. Diabetes mellitus type 2. Glucose cover and monitoring, capillary glucose 116, 106, 123, 151. Tolerating po well.   8. Morbid obesity.  Physical therapy, out of bed as tolerated.   9. Lower GI bleed. Hb and hct stable with no signs of active bleeding, will continue to hold on anticoagulation.   DVT prophylaxis: scd  Code Status: full Family Communication: no family at the bedside Disposition Plan: snf   Consultants:   Cardiology  Nephrology  Procedures:     Antimicrobials:  Subjective: Patient feeling better, dyspnea continue  to improve along with lower extremity edema, using bipap at night. Out of bed to chair.   Objective: Vitals:   12/28/16 0214 12/28/16 0350 12/28/16 0528 12/28/16 0825  BP:   104/69 118/81  Pulse: 72 66    Resp: 19 15 17 20   Temp:   97.6 F (36.4 C) 97.7 F (36.5 C)  TempSrc:   Oral Oral  SpO2: 99% 100% 96% 99%  Weight:   (!) 149.1 kg (328 lb 11.2 oz)   Height:        Intake/Output Summary (Last 24 hours) at 12/28/16 1127 Last data filed at 12/28/16 1054  Gross per 24 hour  Intake              960 ml  Output             1600 ml  Net             -640 ml   Filed Weights   12/26/16 0502 12/27/16 0425 12/28/16 0528  Weight: (!) 149.6 kg (329 lb 12.8 oz) (!) 149.2 kg (328 lb 14.4 oz) (!) 149.1 kg (328 lb 11.2 oz)    Examination:   General: Not in pain or dyspnea, deconditioned Neurology: Awake and alert, non focal  E VVO:HYWV pallor, no icterus, oral mucosa moist Cardiovascular: No JVD. S1-S2 present, rhythmic, no gallops, rubs, or murmurs. ++ pitting lower extremity edema. Pulmonary: vesicular breath sounds bilaterally, adequate air movement, no wheezing, rhonchi. Bibasilar rales.  Gastrointestinal. Abdomen protuberant, no organomegaly, non tender, no rebound or guarding Skin. No rashes Musculoskeletal: no joint deformities     Data Reviewed: I have personally reviewed following labs and imaging studies  CBC:  Recent Labs Lab 12/22/16 1230 12/24/16 1110 12/25/16 0401 12/26/16 0413 12/27/16 0525  WBC 7.3 7.5 6.5 6.6 5.0  HGB 10.2* 10.0* 9.7* 9.5* 9.8*  HCT 34.5* 33.1* 33.3* 31.7* 33.4*  MCV 95.6 96.2 96.0 94.1 96.0  PLT 236 266 246 263 371   Basic Metabolic Panel:  Recent Labs Lab 12/24/16 1110 12/25/16 0401 12/26/16 0413 12/27/16 0525 12/28/16 0500  NA 134* 134* 133* 135 136  K 4.8 5.0 5.2* 4.8 4.9  CL 89* 89* 90* 90* 93*  CO2 36* 35* 35* 35* 32  GLUCOSE 104* 93 106* 107* 107*  BUN 31* 37* 43* 38* 34*  CREATININE 2.24* 3.03* 2.64* 2.05* 1.60*    CALCIUM 8.4* 8.5* 8.2* 8.6* 8.6*  PHOS  --   --  4.4 4.5  --    GFR: Estimated Creatinine Clearance: 69.1 mL/min (A) (by C-G formula based on SCr of 1.6 mg/dL (H)). Liver Function Tests:  Recent Labs Lab 12/26/16 0413 12/27/16 0525  ALBUMIN 3.1* 3.3*   No results for input(s): LIPASE, AMYLASE in the last 168 hours.  Recent Labs Lab 12/24/16 1800  AMMONIA 30   Coagulation Profile: No results for input(s): INR, PROTIME in the last 168 hours. Cardiac Enzymes: No results for input(s): CKTOTAL, CKMB, CKMBINDEX, TROPONINI in the last 168 hours. BNP (last 3 results) No results for input(s): PROBNP in the last 8760 hours. HbA1C: No results for input(s): HGBA1C in the last 72 hours. CBG:  Recent Labs Lab 12/27/16 0633 12/27/16 1126 12/27/16 1558 12/27/16 2112 12/28/16 0602  GLUCAP 91 121* 136* 116* 106*   Lipid Profile: No results for input(s): CHOL, HDL, LDLCALC, TRIG, CHOLHDL, LDLDIRECT in the last 72 hours. Thyroid Function Tests: No results for input(s): TSH, T4TOTAL, FREET4, T3FREE, THYROIDAB in  the last 72 hours. Anemia Panel:  Recent Labs  12/26/16 0413  VITAMINB12 515  FOLATE 7.4  FERRITIN 76  TIBC 293  IRON 20*  RETICCTPCT 0.9      Radiology Studies: I have reviewed all of the imaging during this hospital visit personally     Scheduled Meds: . amiodarone  200 mg Oral Daily  . atorvastatin  80 mg Oral q1800  . bisacodyl  10 mg Rectal Once  . carvedilol  3.125 mg Oral BID WC  . docusate sodium  100 mg Oral BID  . furosemide  40 mg Intravenous Daily  . gabapentin  100 mg Oral BID  . insulin aspart  0-15 Units Subcutaneous TID WC  . rOPINIRole  1 mg Oral QHS  . senna  1 tablet Oral BID  . sodium chloride flush  10-40 mL Intracatheter Q12H  . sodium polystyrene  30 g Oral Once   Continuous Infusions: . ferumoxytol Stopped (12/26/16 1400)     LOS: 16 days        Jalisha Enneking Gerome Apley, MD [facility] Pager [phone]

## 2016-12-28 NOTE — Progress Notes (Addendum)
ANTICOAGULATION CONSULT NOTE - Initial Consult  Pharmacy Consult for warfarin Indication: atrial fibrillation  Allergies  Allergen Reactions  . Ampicillin Rash  . Clindamycin/Lincomycin Rash  . Penicillins Rash    Has patient had a PCN reaction causing immediate rash, facial/tongue/throat swelling, SOB or lightheadedness with hypotension: Yes Has patient had a PCN reaction causing severe rash involving mucus membranes or skin necrosis: No Has patient had a PCN reaction that required hospitalization: No Has patient had a PCN reaction occurring within the last 10 years: No If all of the above answers are "NO", then may proceed with Cephalosporin use.    Patient Measurements: Height: 5\' 8"  (172.7 cm) Weight: (!) 328 lb 11.2 oz (149.1 kg) IBW/kg (Calculated) : 68.4  Vital Signs: Temp: 98.2 F (36.8 C) (10/19 1221) Temp Source: Oral (10/19 1221) BP: 130/81 (10/19 1221) Pulse Rate: 66 (10/19 0350)  Labs:  Recent Labs  12/26/16 0413 12/27/16 0525 12/28/16 0500  HGB 9.5* 9.8*  --   HCT 31.7* 33.4*  --   PLT 263 274  --   CREATININE 2.64* 2.05* 1.60*   Estimated Creatinine Clearance: 69.1 mL/min (A) (by C-G formula based on SCr of 1.6 mg/dL (H)).  Medical History: Past Medical History:  Diagnosis Date  . Atherosclerosis   . Atrial fibrillation ([place])   . 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 Chest pain   . Chronic combined systolic and diastolic heart failure ([place])   . Chronic pain   . Coronary artery disease   . Diabetes mellitus without complication ([place])    type two  . Hyperlipidemia   . Hypertension    essential  . Ischemic cardiomyopathy   . Major depressive disorder, recurrent ([place])   . Morbid obesity due to excess calories ([place])   . Muscle weakness (generalized)   . Neuropathy   . Obstructive sleep apnea   . Presence of automatic cardioverter/defibrillator (AICD)   . Renal disorder    Chronic kidney disease  . Restless leg  syndrome   . Sick-euthyroid syndrome    Medications:  Prescriptions Prior to Admission  Medication Sig Dispense Refill Last Dose  . acetaminophen (TYLENOL) 325 MG tablet Take 650 mg by mouth every 6 (six) hours as needed.   unknown  . amiodarone (PACERONE) 200 MG tablet Take 200 mg by mouth daily.   12/10/2016 at Unknown time  . apixaban (ELIQUIS) 5 MG TABS tablet Take 5 mg by mouth 2 (two) times daily.   12/10/2016 at 1700  . aspirin EC 81 MG tablet Take 81 mg by mouth daily.   12/10/2016 at Unknown time  . atorvastatin (LIPITOR) 80 MG tablet Take 80 mg by mouth every evening.   12/10/2016 at Unknown time  . bumetanide (BUMEX) 2 MG tablet Take 2 mg by mouth 2 (two) times daily. *May take one additional tablet every 12 hours as needed for increase in edema/shortness of breath   12/10/2016 at Unknown time  . carvedilol (COREG) 3.125 MG tablet Take 3.125 mg by mouth 2 (two) times daily with a meal.   12/10/2016 at 1700  . docusate sodium (COLACE) 100 MG capsule Take 100 mg by mouth daily.   12/10/2016 at Unknown time  . DULoxetine (CYMBALTA) 30 MG capsule Take 30 mg by mouth every evening.   12/10/2016 at Unknown time  . gabapentin (NEURONTIN) 100 MG capsule Take 100 mg by mouth 3 (three) times daily.   12/10/2016 at 2100  . guaifenesin (ROBITUSSIN) 100 MG/5ML  syrup Take 200 mg by mouth 3 (three) times daily as needed for cough or congestion.   unknown  . isosorbide mononitrate (IMDUR) 60 MG 24 hr tablet Take 60 mg by mouth daily.   12/10/2016 at Unknown time  . magnesium oxide (MAG-OX) 400 MG tablet Take 400 mg by mouth daily.   12/10/2016 at Unknown time  . nitroGLYCERIN (NITROSTAT) 0.4 MG SL tablet Place 0.4 mg under the tongue every 5 (five) minutes as needed for chest pain.   unknown  . oxycodone (OXY-IR) 5 MG capsule Take 5 mg by mouth every 4 (four) hours as needed for pain.   unknown  . rOPINIRole (REQUIP) 1 MG tablet Take 1 mg by mouth at bedtime.   12/10/2016 at Unknown time  . senna (SENOKOT) 8.6  MG TABS tablet Take 1 tablet by mouth at bedtime.   12/10/2016 at Unknown time  . spironolactone (ALDACTONE) 12.5 mg TABS tablet Take 12.5 mg by mouth daily.   12/10/2016 at Unknown time  . sulfamethoxazole-trimethoprim (BACTRIM DS,SEPTRA DS) 800-160 MG tablet Take 1 tablet by mouth 2 (two) times daily. Starting on 12/08/2016-to be completed on 12/17/2016   12/10/2016 at Unknown time  . tiotropium (SPIRIVA) 18 MCG inhalation capsule Place 18 mcg into inhaler and inhale daily.   12/10/2016 at Unknown time   Assessment: 61 y/o male with atrial fibrillation that is rate controlled and recent GIB resulting in discontinuation of anticoagulation, recent fall, admitted for acute on chronic systolic HF and AKI. AKI resolving and no evidence of continued GI bleed. HgB stable ~9.5-10. Cardiology has consulted pharmacy to start warfarin and NOT resume apixaban 2/2 to the fluctuating renal function. Noted drug-drug interaction between amiodarone and warfarin.  Goal of Therapy:  INR 2-3 Monitor platelets by anticoagulation protocol: Yes   Plan:  Warfarin 2.5mg  x1 tonight Daily INR/CBC Monitor for s/sx of bleeding  Cylah Fannin L Deisy Ozbun 12/28/2016,1:13 PM

## 2016-12-28 NOTE — Progress Notes (Signed)
S: 61 y.o gentleman with PMHx of HFrEF with AKI due to excessive diuresis. Patient was feeling better and seemed this morning, according to him his shortness of breath is improving, lower extremity swelling is improving. His creatinine continued to improve with gentle diuresis.  O:BP 118/81 (BP Location: Left Arm)   Pulse 66   Temp 97.7 F (36.5 C) (Oral)   Resp 20   Ht 5\' 8"  (1.727 m)   Wt (!) 328 lb 11.2 oz (149.1 kg)   SpO2 99%   BMI 49.98 kg/m   Intake/Output Summary (Last 24 hours) at 12/28/16 1140 Last data filed at 12/28/16 1054  Gross per 24 hour  Intake              960 ml  Output             1600 ml  Net             -640 ml   Intake/Output: I/O last 3 completed shifts: In: 980 [P.O.:950; I.V.:30] Out: 2825 [Urine:2825]  Intake/Output this shift:  Total I/O In: 360 [P.O.:360] Out: 550 [Urine:550] Weight change: -3.2 oz (-0.091 kg) Gen: well-developed, obese gentleman, in no acute distress. CVS:  regular rate and rhythm. Resp: bilateral basal crackles with decreased air entry at bases. Abd: soft, non tender, obese, bowel sounds positive. Ext: 1+ edema bilaterally, significantly improved as compared to earlier, pulses intact.   Recent Labs Lab 12/22/16 1230 12/23/16 0349 12/24/16 1110 12/25/16 0401 12/26/16 0413 12/27/16 0525 12/28/16 0500  NA 134* 136 134* 134* 133* 135 136  K 4.2 4.5 4.8 5.0 5.2* 4.8 4.9  CL 88* 91* 89* 89* 90* 90* 93*  CO2 39* 39* 36* 35* 35* 35* 32  GLUCOSE 132* 123* 104* 93 106* 107* 107*  BUN 19 22* 31* 37* 43* 38* 34*  CREATININE 1.55* 1.78* 2.24* 3.03* 2.64* 2.05* 1.60*  ALBUMIN  --   --   --   --  3.1* 3.3*  --   CALCIUM 8.2* 8.4* 8.4* 8.5* 8.2* 8.6* 8.6*  PHOS  --   --   --   --  4.4 4.5  --    Liver Function Tests:  Recent Labs Lab 12/26/16 0413 12/27/16 0525  ALBUMIN 3.1* 3.3*   No results for input(s): LIPASE, AMYLASE in the last 168 hours.  Recent Labs Lab 12/24/16 1800  AMMONIA 30   CBC:  Recent Labs Lab  12/22/16 1230 12/24/16 1110 12/25/16 0401 12/26/16 0413 12/27/16 0525  WBC 7.3 7.5 6.5 6.6 5.0  HGB 10.2* 10.0* 9.7* 9.5* 9.8*  HCT 34.5* 33.1* 33.3* 31.7* 33.4*  MCV 95.6 96.2 96.0 94.1 96.0  PLT 236 266 246 263 274   Cardiac Enzymes: No results for input(s): CKTOTAL, CKMB, CKMBINDEX, TROPONINI in the last 168 hours. CBG:  Recent Labs Lab 12/27/16 1126 12/27/16 1558 12/27/16 2112 12/28/16 0602 12/28/16 1125  GLUCAP 121* 136* 116* 106* 123*    Iron Studies:  Recent Labs  12/26/16 0413  IRON 20*  TIBC 293  FERRITIN 76   Studies/Results: No results found. [name] amiodarone  200 mg Oral Daily  . atorvastatin  80 mg Oral q1800  . bisacodyl  10 mg Rectal Once  . carvedilol  3.125 mg Oral BID WC  . docusate sodium  100 mg Oral BID  . furosemide  40 mg Intravenous Daily  . gabapentin  100 mg Oral BID  . insulin aspart  0-15 Units Subcutaneous TID WC  . rOPINIRole  1 mg  Oral QHS  . senna  1 tablet Oral BID  . sodium chloride flush  10-40 mL Intracatheter Q12H  . sodium polystyrene  30 g Oral Once    BMET    Component Value Date/Time   NA 136 12/28/2016 0500   K 4.9 12/28/2016 0500   CL 93 (L) 12/28/2016 0500   CO2 32 12/28/2016 0500   GLUCOSE 107 (H) 12/28/2016 0500   BUN 34 (H) 12/28/2016 0500   CREATININE 1.60 (H) 12/28/2016 0500   CALCIUM 8.6 (L) 12/28/2016 0500   GFRNONAA 45 (L) 12/28/2016 0500   GFRAA 52 (L) 12/28/2016 0500   CBC    Component Value Date/Time   WBC 5.0 12/27/2016 0525   RBC 3.48 (L) 12/27/2016 0525   HGB 9.8 (L) 12/27/2016 0525   HCT 33.4 (L) 12/27/2016 0525   PLT 274 12/27/2016 0525   MCV 96.0 12/27/2016 0525   MCH 28.2 12/27/2016 0525   MCHC 29.3 (L) 12/27/2016 0525   RDW 15.2 12/27/2016 0525   LYMPHSABS 0.6 (L) 12/17/2016 0848   MONOABS 0.7 12/17/2016 0848   EOSABS 0.3 12/17/2016 0848   BASOSABS 0.0 12/17/2016 0848     Assessment/Plan:  1. AKI. Most likely due to cardiorenal syndrome secondary to aggressive dialysis in a  shortperiod of time. Creatinine continuity to improve with gentle diuresis, it was 1.60 today. -we can continue diuresis with the existing regimen of 40 mg IV daily, can be switched to by mouth 80 mg daily with an occasional extra dose if needed. -keep monitoring renal function. -He needs to be established with some nephrology services, patient has no permanent residence locally at this time, most likely move back with his niece.  2. Anemia.. Hemoglobin stable, Can get is next dose of Feraheme on Sunday.  3. HFrEF. Cardiology is following,they agreed to keep gentle diuresis.  4. A Fib. Rate controlled on amiodarone and Coreg.Eliquis still on hold due to GI bleed.  5. OSA. Continue with BiPAP at night.   I have seen and examined this patient and agree with plan and assessment in the above note with renal recommendations/intervention highlighted.  He will need nephrology follow up as an outpatient however he has moved around so much he cannot establish care until he moves to stay with his niece in [place] after discharge.  Would set goal UF of 1-2 liters/day and continue with Lasix 40mg  IV or 80mg  po when ready.  Nothing further to add, will sign off.  Please call with any questions or concerns.  Governor Rooks Breyson Kelm,MD 12/28/2016 2:15 PM   Donetta Potts, MD [facility] [phone]

## 2016-12-28 NOTE — Progress Notes (Addendum)
Progress Note  Patient Name: Darrell Black Date of Encounter: 12/28/2016  Primary Cardiologist: Dr/. Bronson Ing and Dr. Thurston Hole [facility])  Subjective   Feels better. breathing improved.   Inpatient Medications    Scheduled Meds: . amiodarone  200 mg Oral Daily  . atorvastatin  80 mg Oral q1800  . bisacodyl  10 mg Rectal Once  . carvedilol  3.125 mg Oral BID WC  . docusate sodium  100 mg Oral BID  . furosemide  40 mg Intravenous Daily  . gabapentin  100 mg Oral BID  . insulin aspart  0-15 Units Subcutaneous TID WC  . rOPINIRole  1 mg Oral QHS  . senna  1 tablet Oral BID  . sodium chloride flush  10-40 mL Intracatheter Q12H  . sodium polystyrene  30 g Oral Once   Continuous Infusions: . ferumoxytol Stopped (12/26/16 1400)   PRN Meds: acetaminophen, albuterol, metoprolol tartrate, nitroGLYCERIN, ondansetron **OR** ondansetron (ZOFRAN) IV, sodium chloride flush   Vital Signs    Vitals:   12/28/16 0214 12/28/16 0350 12/28/16 0528 12/28/16 0825  BP:   104/69 118/81  Pulse: 72 66    Resp: 19 15 17 20   Temp:   97.6 F (36.4 C) 97.7 F (36.5 C)  TempSrc:   Oral Oral  SpO2: 99% 100% 96% 99%  Weight:   (!) 328 lb 11.2 oz (149.1 kg)   Height:        Intake/Output Summary (Last 24 hours) at 12/28/16 1140 Last data filed at 12/28/16 1054  Gross per 24 hour  Intake              960 ml  Output             1600 ml  Net             -640 ml   Filed Weights   12/26/16 0502 12/27/16 0425 12/28/16 0528  Weight: (!) 329 lb 12.8 oz (149.6 kg) (!) 328 lb 14.4 oz (149.2 kg) (!) 328 lb 11.2 oz (149.1 kg)    Telemetry    afib w/ CVR - Personally Reviewed  ECG    afib  - Personally Reviewed  Physical Exam   GEN: No acute distress. Obese   Neck: No JVD Cardiac: irreguarlly irregular, no murmurs, rubs, or gallops.  Respiratory: decreased BS at the bases bilaterally. GI: Soft, nontender, non-distended  MS: 2 + bilateral LEE edema; No deformity. Neuro:  Nonfocal  Psych:  Normal affect   Labs    Chemistry Recent Labs Lab 12/26/16 0413 12/27/16 0525 12/28/16 0500  NA 133* 135 136  K 5.2* 4.8 4.9  CL 90* 90* 93*  CO2 35* 35* 32  GLUCOSE 106* 107* 107*  BUN 43* 38* 34*  CREATININE 2.64* 2.05* 1.60*  CALCIUM 8.2* 8.6* 8.6*  ALBUMIN 3.1* 3.3*  --   GFRNONAA 25* 33* 45*  GFRAA 28* 39* 52*  ANIONGAP 8 10 11      Hematology Recent Labs Lab 12/25/16 0401 12/26/16 0413 12/27/16 0525  WBC 6.5 6.6 5.0  RBC 3.47* 3.37*  3.37* 3.48*  HGB 9.7* 9.5* 9.8*  HCT 33.3* 31.7* 33.4*  MCV 96.0 94.1 96.0  MCH 28.0 28.2 28.2  MCHC 29.1* 30.0 29.3*  RDW 15.4 15.3 15.2  PLT 246 263 274    Cardiac EnzymesNo results for input(s): TROPONINI in the last 168 hours. No results for input(s): TROPIPOC in the last 168 hours.   BNPNo results for input(s): BNP, PROBNP in the last 168  hours.   DDimer No results for input(s): DDIMER in the last 168 hours.   Radiology    No results found.  Cardiac Studies   2D Echo 12/12/16 Study Conclusions  - Procedure narrative: Contrast enhancement employed. - Left ventricle: The cavity size was normal. Wall thickness was   increased in a pattern of mild LVH. Systolic function was   moderately reduced. The estimated ejection fraction was in the   range of 35% to 40%. Diffuse hypokinesis. The study was not   technically sufficient to allow evaluation of LV diastolic   dysfunction due to atrial fibrillation. - Regional wall motion abnormality: Akinesis of the apical septal   and apical myocardium; moderate hypokinesis of the basal-mid   anteroseptal myocardium. - Aortic valve: Mildly calcified leaflets. - Mitral valve: Moderately calcified annulus. Normal thickness   leaflets . - Left atrium: The atrium was mildly dilated. - Right ventricle: Pacer wire or catheter noted in right ventricle.   Systolic function was mildly reduced. - Tricuspid valve: There was mild regurgitation. - Pulmonary arteries: PA peak pressure:  38 mm Hg (S). - Systemic veins: IVC is dilated wtih normal respiratory variation.   Estimated CVP 8 mmHg.  Patient Profile     60 y/o male with h/o dilated CM, chronic systolic HF, permanent atrial fibrillation that is rate controlled and recent GIB resulting in discontinuation of anticoagulation, recent fall, admitted for acute on chronic systolic HF and AKI.   Assessment & Plan    1. Acute on Chronic Systolic HF: Dilated CM. EF 35-40%. He is being treated with IV diuretics. Good diuresis this admit. Net I/Os negative 12.5 L. Breathing improved but still volume overloaded with peripheral pitting edema. Renal function improving. K stable. BP stable. Continue IV Lasix. Strict I/Os and low sodium diet. Daily weights have been recorded but unsure of accuracy.  2. Permanent Atrial Fibrillation: he is rate controlled. Anticoagulation currently on hold given recent GIB and fall. When anticoagulation is decided to be resumed, would opt for warfarin. Avoid DOACs given renal function.   3. AKI: gradually improving. SCr rose to 3.03 this admit. Now down to 1.60. Nephrology following.   4. Hyperkalemia: was in the setting of AKI. Improved with Kayexalate. K stable and WNL today at 4.9.     For questions or updates, please contact [place] Please consult [email or web address] for contact info under Cardiology/STEMI.      Signed, Lyda Jester, PA-C  12/28/2016, 11:40 AM    Personally seen and examined. Agree with above.  Systolic HF, acute  - appreciate Nephro assistance. Will aim for 1-2L off daily. If more aggressive, may lead to ATN again.   AFIB perm  - restarting warfarin no further GIB and falls.   AKI  - improving  CAD  - prior stent in 2008, 2017 in [place]. CABG  ICD   - in place, 2006  Psychosis  - improved  Less SOB. +LE edema, alert, RRR, ICD in place  Candee Furbish, MD

## 2016-12-29 LAB — CBC
HCT: 33.9 % — ABNORMAL LOW (ref 39.0–52.0)
Hemoglobin: 9.7 g/dL — ABNORMAL LOW (ref 13.0–17.0)
MCH: 28.1 pg (ref 26.0–34.0)
MCHC: 28.6 g/dL — ABNORMAL LOW (ref 30.0–36.0)
MCV: 98.3 fL (ref 78.0–100.0)
PLATELETS: 288 10*3/uL (ref 150–400)
RBC: 3.45 MIL/uL — AB (ref 4.22–5.81)
RDW: 15.4 % (ref 11.5–15.5)
WBC: 5.4 10*3/uL (ref 4.0–10.5)

## 2016-12-29 LAB — BASIC METABOLIC PANEL
ANION GAP: 8 (ref 5–15)
BUN: 34 mg/dL — AB (ref 6–20)
CO2: 37 mmol/L — ABNORMAL HIGH (ref 22–32)
Calcium: 8.9 mg/dL (ref 8.9–10.3)
Chloride: 91 mmol/L — ABNORMAL LOW (ref 101–111)
Creatinine, Ser: 1.49 mg/dL — ABNORMAL HIGH (ref 0.61–1.24)
GFR calc Af Amer: 57 mL/min — ABNORMAL LOW (ref >60)
GFR, EST NON AFRICAN AMERICAN: 49 mL/min — AB (ref >60)
Glucose, Bld: 115 mg/dL — ABNORMAL HIGH (ref 65–99)
POTASSIUM: 4.5 mmol/L (ref 3.5–5.1)
SODIUM: 136 mmol/L (ref 135–145)

## 2016-12-29 LAB — PROTIME-INR
INR: 1.19
PROTHROMBIN TIME: 15 s (ref 11.4–15.2)

## 2016-12-29 LAB — GLUCOSE, CAPILLARY: GLUCOSE-CAPILLARY: 103 mg/dL — AB (ref 65–99)

## 2016-12-29 MED ORDER — FUROSEMIDE 40 MG PO TABS
40.0000 mg | ORAL_TABLET | Freq: Every day | ORAL | Status: DC
  Administered 2016-12-29 – 2016-12-31 (×3): 40 mg via ORAL
  Filled 2016-12-29 (×3): qty 1

## 2016-12-29 MED ORDER — APIXABAN 5 MG PO TABS
5.0000 mg | ORAL_TABLET | Freq: Two times a day (BID) | ORAL | Status: DC
  Administered 2016-12-29 – 2016-12-31 (×5): 5 mg via ORAL
  Filled 2016-12-29 (×5): qty 1

## 2016-12-29 NOTE — Progress Notes (Signed)
Progress Note  Patient Name: Darrell Black Date of Encounter: 12/29/2016  Primary Cardiologist: Dr. Kate Sable  Subjective   Sitting in bedside chair. No chest pain or breathlessness at rest. No palpitations. Stable appetite.  Inpatient Medications    Scheduled Meds: . amiodarone  200 mg Oral Daily  . apixaban  5 mg Oral BID  . atorvastatin  80 mg Oral q1800  . bisacodyl  10 mg Rectal Once  . carvedilol  3.125 mg Oral BID WC  . docusate sodium  100 mg Oral BID  . furosemide  40 mg Oral Daily  . gabapentin  100 mg Oral BID  . insulin aspart  0-15 Units Subcutaneous TID WC  . rOPINIRole  1 mg Oral QHS  . senna  1 tablet Oral BID  . sodium chloride flush  10-40 mL Intracatheter Q12H  . sodium polystyrene  30 g Oral Once   Continuous Infusions: . [START ON 12/30/2016] ferumoxytol     PRN Meds: acetaminophen, albuterol, metoprolol tartrate, nitroGLYCERIN, ondansetron **OR** ondansetron (ZOFRAN) IV, sodium chloride flush   Vital Signs    Vitals:   12/28/16 2357 12/29/16 0308 12/29/16 0342 12/29/16 0854  BP:   119/79 132/84  Pulse: 72 75  91  Resp:  (!) 28 20 (!) 23  Temp:   (!) 97.5 F (36.4 C) 97.7 F (36.5 C)  TempSrc:   Oral Oral  SpO2:  93% 94%   Weight:   (!) 329 lb 4.8 oz (149.4 kg)   Height:        Intake/Output Summary (Last 24 hours) at 12/29/16 1228 Last data filed at 12/29/16 0300  Gross per 24 hour  Intake              510 ml  Output              670 ml  Net             -160 ml   Filed Weights   12/27/16 0425 12/28/16 0528 12/29/16 0342  Weight: (!) 328 lb 14.4 oz (149.2 kg) (!) 328 lb 11.2 oz (149.1 kg) (!) 329 lb 4.8 oz (149.4 kg)    Telemetry    Rate-controlled atrial fibrillation. Personally reviewed.  Physical Exam   GEN:  Morbidly obese male. No acute distress.   Neck: No JVD. Cardiac:  Irregularly irregular, no gallop.  Respiratory: Nonlabored. Clear to auscultation bilaterally. GI:  Obese, nontender, bowel sounds  present. MS:  2+ leg edema; No deformity. Neuro:  Nonfocal. Psych: Alert and oriented x 3. Normal affect.  Labs    Chemistry Recent Labs Lab 12/26/16 0413 12/27/16 0525 12/28/16 0500 12/29/16 0500  NA 133* 135 136 136  K 5.2* 4.8 4.9 4.5  CL 90* 90* 93* 91*  CO2 35* 35* 32 37*  GLUCOSE 106* 107* 107* 115*  BUN 43* 38* 34* 34*  CREATININE 2.64* 2.05* 1.60* 1.49*  CALCIUM 8.2* 8.6* 8.6* 8.9  ALBUMIN 3.1* 3.3*  --   --   GFRNONAA 25* 33* 45* 49*  GFRAA 28* 39* 52* 57*  ANIONGAP 8 10 11 8      Hematology Recent Labs Lab 12/26/16 0413 12/27/16 0525 12/29/16 0500  WBC 6.6 5.0 5.4  RBC 3.37*  3.37* 3.48* 3.45*  HGB 9.5* 9.8* 9.7*  HCT 31.7* 33.4* 33.9*  MCV 94.1 96.0 98.3  MCH 28.2 28.2 28.1  MCHC 30.0 29.3* 28.6*  RDW 15.3 15.2 15.4  PLT 263 274 288    Radiology  No results found.  Cardiac Studies   Echocardiogram 12/12/2016: Study Conclusions  - Procedure narrative: Contrast enhancement employed. - Left ventricle: The cavity size was normal. Wall thickness was   increased in a pattern of mild LVH. Systolic function was   moderately reduced. The estimated ejection fraction was in the   range of 35% to 40%. Diffuse hypokinesis. The study was not   technically sufficient to allow evaluation of LV diastolic   dysfunction due to atrial fibrillation. - Regional wall motion abnormality: Akinesis of the apical septal   and apical myocardium; moderate hypokinesis of the basal-mid   anteroseptal myocardium. - Aortic valve: Mildly calcified leaflets. - Mitral valve: Moderately calcified annulus. Normal thickness   leaflets . - Left atrium: The atrium was mildly dilated. - Right ventricle: Pacer wire or catheter noted in right ventricle.   Systolic function was mildly reduced. - Tricuspid valve: There was mild regurgitation. - Pulmonary arteries: PA peak pressure: 38 mm Hg (S). - Systemic veins: IVC is dilated wtih normal respiratory variation.   Estimated  CVP 8 mmHg.  Patient Profile     61 y.o. male with history of dilated cardiomyopathy with chronic systolic heart failure, permanent atrial fibrillation, recent GI bleed, recent fall, being managed for acute on chronic systolic heart failure in the setting of acute on chronic renal failure.  Assessment & Plan    1. Acute on chronic systolic heart failure, clinically improved with significant diuresis. Now on oral Lasix.  2. Dilated cardiomyopathy with LVEF 35-40%. Medical therapy includes Coreg and Lasix. On on ACE inhibitor, ARB, or Aldactone with fluctuating renal insufficiency.  3. Chronic atrial fibrillation. Heart rate control is adequate on Coreg and amiodarone. Anticoagulation resumed the form of Eliquis with renal function improving, creatinine 1.49.  4. Acute renal insufficiency, significant improvement with creatinine down from 3.0-1.49.  5. CAD status post previous PCI and CABG. No active angina symptoms. Continues on statin therapy.  6. ICD in place.  Current cardiac regimen includes Eliquis which was recently resumed, amiodarone, Lipitor, Coreg, and oral Lasix. Continue to track urine output with follow-up BMET. No plans to add ACE-I or ARB with recent renal insufficiency.  Signed, Rozann Lesches, MD  12/29/2016, 12:28 PM

## 2016-12-29 NOTE — Progress Notes (Signed)
Patient requested something to sleep. One time order of Ambien was given. Patient stated that, "he felt out of it". Patient decided that he did not want anymore of Ambien. Patient was easily redirected. Will continue to monitor

## 2016-12-29 NOTE — Progress Notes (Signed)
PROGRESS NOTE    Darrell Black  EPP:[id number] DOB: 1955-10-30 DOA: 12/10/2016 PCP: Renata Caprice, DO     Brief Narrative:  61 year old male who presented with dyspnea. He does have significant past medical history of atrial fibrillation, heart failure, coronary artery disease, ischemic cardiomyopathy, type 2 diabetes mellitus, dyslipidemia, hypertension, morbid obesity, chronic kidney disease and depression. Reported worsening hallucinations for last 4 days prior to hospitalization, his psychotropic agents have been modified as an outpatient, he developed acute dyspnea about 12 hours prior to admission. On his initial physical examination blood pressure 111/72, heart rate 85, respiratory rate 23 to 28, oxygen saturation 89-94%. He was pale, multiple mucosal bleeding abrasion, decreased breath sounds at bases bilaterally, heart S1-S2 present irregularly irregular, obese abdomen, no lower extremity edema. Patient was somnolent but easily aroused. Arterial blood gas 7.32/ 84.2/ 59.7/ 37.8/ 85.2%, on 4 L per nasal. Chest x-ray with hyperinflation, right rotation, positive cardiomegaly, small bilateral pleural effusions and vascular congestion. EKG with atrial fibrillation, poor R-wave progression, left bundle-branch block, occasional ventricular pacing.   Patient was admitted to the hospital working diagnosis acute hypoxic/hypercapnic respiratory failure due to cardiogenic pulmonary edema due to acute on chronic decompensated systolic heart failure.   Patient required noninvasive mechanical ventilation, aggressive diureses. This hospitalization was complicated by lower GI bleed, and psychosis.  As of 10/20 he is much improved. Plan to transition to PO lasix. If stable, can DC SNF over next 24-48 hours (per SW SNF unable to accept over weekend).   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 Acute hypoxic/hypercapnic respiratory failure. Multifactorial: OSA/OHS, acute systolic CHF. Patient has been using intermittently bipap. Dyspnea and mentation continue to improve and are now at baseline. SNF aware that patient uses BIPAP at night and for naps and are ok with it.    2. Metabolic/toxic encephalopathy. Resolved and at baseline. Likely due to acute respiratory failure and psychosis.  3. Acute on chronic systolic heart failure decompensation. ECHO with EF 35-40% and diffuse hypokinesis. Cardiology on board. Urine output 1495 cc over last 24 hours, total 11.3 L negative since admission. Plan to transition lasix over to 40 mg PO daily today and observe diuresis and renal function overnight in anticipation of DC over next 24-48 hours.  4. Cellulitis, lower extremities. Resolved, no antibiotic therapy. Doubt true cellulitis.  5. Acute kidney injury on chronic kidney disease stage 3. Suspect due to rapid diuresis and CHF. Renal function with improvement in serum cr down to 1.49 on 10/20. Appreciate nephrology input and recommendations.  6. Chronic atrial fibrillation. Rate controlled with amiodarone and coreg, heart rate in the 70's. Has been on warfarin, still INR not therapeutic. Has been cleared by GI to start anticoagulation and as renal function is improving will place on Eliquis. Discussed with pharmacy.  7. Diabetes mellitus type 2. Well controlled. Continue current regimen.  8. Morbid obesity.  Physical therapy,  out of bed as  tolerated.   9. Lower GI bleed. Hb and hct stable with no signs of active bleeding, will continue to hold on anticoagulation.     DVT prophylaxis: Warfarin, transition to eliquis today Code Status: Full Code Family Communication: Patient only Disposition Plan: SNF in 24-48 hours  Consultants:   Cardiology  Nephrology  Procedures:   ECHO as above  Antimicrobials:  Anti-infectives    Start     Dose/Rate Route Frequency Ordered Stop   12/21/16 1000  sulfamethoxazole-trimethoprim (BACTRIM DS,SEPTRA DS) 800-160 MG per tablet 1 tablet  Status:  Discontinued     1 tablet Oral Every 12 hours 12/21/16 0828 12/24/16 1306   12/20/16 1345  doxycycline (VIBRA-TABS) tablet 100 mg  Status:  Discontinued     100 mg Oral Every 12 hours 12/20/16 1342 12/21/16 0828   12/16/16 1300  ceFAZolin (ANCEF) IVPB 1 g/50 mL premix  Status:  Discontinued     1 g 100 mL/hr over 30 Minutes Intravenous Every 8 hours 12/16/16 1016 12/20/16 1342   12/15/16 1000  cefTRIAXone (ROCEPHIN) 1 g in dextrose 5 % 50 mL IVPB  Status:  Discontinued     1 g 100 mL/hr over 30 Minutes Intravenous Every 24 hours 12/15/16 0756 12/16/16 1016   12/11/16 0000  sulfamethoxazole-trimethoprim (BACTRIM DS,SEPTRA DS) 800-160 MG per tablet 1 tablet  Status:  Discontinued     1 tablet Oral 2 times daily 12/10/16 2350 12/12/16 0700       Subjective: Feels well, no SOB or CP.   Objective: Vitals:   12/28/16 2357 12/29/16 0308 12/29/16 0342 12/29/16 0854  BP:   119/79 132/84  Pulse: 72 75  91  Resp:  (!) 28 20 (!) 23  Temp:   (!) 97.5 F (36.4 C) 97.7 F (36.5 C)  TempSrc:   Oral Oral  SpO2:  93% 94%   Weight:   (!) 149.4 kg (329 lb 4.8 oz)   Height:        Intake/Output Summary (Last 24 hours) at 12/29/16 1038 Last data filed at 12/29/16 0300  Gross per 24 hour  Intake              870 ml  Output             1295 ml  Net             -425 ml   Filed Weights   12/27/16 0425 12/28/16 0528 12/29/16 0342  Weight:  (!) 149.2 kg (328 lb 14.4 oz) (!) 149.1 kg (328 lb 11.2 oz) (!) 149.4 kg (329 lb 4.8 oz)    Examination:  General exam: Alert, awake, oriented x 3, morbidly obese. Respiratory system: Clear to auscultation. Respiratory effort normal. Cardiovascular system:RRR. No murmurs, rubs, gallops. Gastrointestinal system: Abdomen is nondistended, soft and nontender. No organomegaly or masses felt. Normal bowel sounds heard. Central nervous system: Alert and oriented. No focal neurological deficits. Extremities: 1+ edema bilaterally Skin: No rashes, lesions or ulcers Psychiatry: Judgement and insight appear normal. Mood & affect appropriate.     Data Reviewed: I have personally reviewed following labs and imaging studies  CBC:  Recent Labs Lab 12/24/16 1110 12/25/16 0401 12/26/16 0413 12/27/16 0525 12/29/16 0500  WBC 7.5 6.5 6.6 5.0 5.4  HGB 10.0* 9.7* 9.5* 9.8* 9.7*  HCT 33.1* 33.3* 31.7* 33.4* 33.9*  MCV 96.2 96.0 94.1 96.0 98.3  PLT 266 246 263 274 557   Basic Metabolic Panel:  Recent Labs Lab 12/25/16  0401 12/26/16 0413 12/27/16 0525 12/28/16 0500 12/29/16 0500  NA 134* 133* 135 136 136  K 5.0 5.2* 4.8 4.9 4.5  CL 89* 90* 90* 93* 91*  CO2 35* 35* 35* 32 37*  GLUCOSE 93 106* 107* 107* 115*  BUN 37* 43* 38* 34* 34*  CREATININE 3.03* 2.64* 2.05* 1.60* 1.49*  CALCIUM 8.5* 8.2* 8.6* 8.6* 8.9  PHOS  --  4.4 4.5  --   --    GFR: Estimated Creatinine Clearance: 74.2 mL/min (A) (by C-G formula based on SCr of 1.49 mg/dL (H)). Liver Function Tests:  Recent Labs Lab 12/26/16 0413 12/27/16 0525  ALBUMIN 3.1* 3.3*   No results for input(s): LIPASE, AMYLASE in the last 168 hours.  Recent Labs Lab 12/24/16 1800  AMMONIA 30   Coagulation Profile:  Recent Labs Lab 12/29/16 0500  INR 1.19   Cardiac Enzymes: No results for input(s): CKTOTAL, CKMB, CKMBINDEX, TROPONINI in the last 168 hours. BNP (last 3 results) No results for input(s): PROBNP in the last 8760  hours. HbA1C: No results for input(s): HGBA1C in the last 72 hours. CBG:  Recent Labs Lab 12/28/16 0602 12/28/16 1125 12/28/16 1617 12/28/16 2104 12/29/16 0607  GLUCAP 106* 123* 151* 138* 103*   Lipid Profile: No results for input(s): CHOL, HDL, LDLCALC, TRIG, CHOLHDL, LDLDIRECT in the last 72 hours. Thyroid Function Tests: No results for input(s): TSH, T4TOTAL, FREET4, T3FREE, THYROIDAB in the last 72 hours. Anemia Panel: No results for input(s): VITAMINB12, FOLATE, FERRITIN, TIBC, IRON, RETICCTPCT in the last 72 hours. Urine analysis:    Component Value Date/Time   COLORURINE YELLOW 12/24/2016 1316   APPEARANCEUR CLEAR 12/24/2016 1316   LABSPEC 1.018 12/24/2016 1316   PHURINE 5.0 12/24/2016 1316   GLUCOSEU NEGATIVE 12/24/2016 1316   HGBUR NEGATIVE 12/24/2016 1316   BILIRUBINUR NEGATIVE 12/24/2016 1316   KETONESUR NEGATIVE 12/24/2016 1316   PROTEINUR NEGATIVE 12/24/2016 1316   NITRITE NEGATIVE 12/24/2016 1316   LEUKOCYTESUR NEGATIVE 12/24/2016 1316   Sepsis Labs: @LABRCNTIP (procalcitonin:4,lacticidven:4)  )No results found for this or any previous visit (from the past 240 hour(s)).       Radiology Studies: No results found.      Scheduled Meds: . amiodarone  200 mg Oral Daily  . apixaban  5 mg Oral BID  . atorvastatin  80 mg Oral q1800  . bisacodyl  10 mg Rectal Once  . carvedilol  3.125 mg Oral BID WC  . docusate sodium  100 mg Oral BID  . furosemide  40 mg Intravenous Daily  . gabapentin  100 mg Oral BID  . insulin aspart  0-15 Units Subcutaneous TID WC  . rOPINIRole  1 mg Oral QHS  . senna  1 tablet Oral BID  . sodium chloride flush  10-40 mL Intracatheter Q12H  . sodium polystyrene  30 g Oral Once   Continuous Infusions: . [START ON 12/30/2016] ferumoxytol       LOS: 17 days    Time spent: 35 minutes. Greater than 50% of this time was spent in direct contact with the patient coordinating care.     Lelon Frohlich, MD [facility] Pager [phone]  If 7PM-7AM, please contact night-coverage [email or web address] Password [email or web address] 12/29/2016, 10:38 AM

## 2016-12-29 NOTE — Progress Notes (Signed)
ANTICOAGULATION CONSULT NOTE - Follow Up Consult  Pharmacy Consult for Apixaban Indication: atrial fibrillation  Allergies  Allergen Reactions  . Ampicillin Rash  . Clindamycin/Lincomycin Rash  . Penicillins Rash    Has patient had a PCN reaction causing immediate rash, facial/tongue/throat swelling, SOB or lightheadedness with hypotension: Yes Has patient had a PCN reaction causing severe rash involving mucus membranes or skin necrosis: No Has patient had a PCN reaction that required hospitalization: No Has patient had a PCN reaction occurring within the last 10 years: No If all of the above answers are "NO", then may proceed with Cephalosporin use.    Patient Measurements: Height: 5\' 8"  (172.7 cm) Weight: (!) 329 lb 4.8 oz (149.4 kg) IBW/kg (Calculated) : 68.4  Vital Signs: Temp: 97.7 F (36.5 C) (10/20 0854) Temp Source: Oral (10/20 0854) BP: 132/84 (10/20 0854) Pulse Rate: 91 (10/20 0854)  Labs:  Recent Labs  12/27/16 0525 12/28/16 0500 12/29/16 0500  HGB 9.8*  --  9.7*  HCT 33.4*  --  33.9*  PLT 274  --  288  LABPROT  --   --  15.0  INR  --   --  1.19  CREATININE 2.05* 1.60* 1.49*   Estimated Creatinine Clearance: 74.2 mL/min (A) (by C-G formula based on SCr of 1.49 mg/dL (H)).  Assessment: 61yom on apixaban pta for afib, admitted with HF, AKI, and rectal bleeding. Apixaban discontinued. GI cleared to restart on 10/12. He then fell on 10/13 (CT head negative for bleed) and apixaban discontinued again due to fall risk. Anticoagulation was resumed yesterday but with warfarin given AKI. With resolving AKI, he will switch back to apixaban today. He does not meet any other requirements for dose reduction. CBC stable. INR 1.19.  Goal of Therapy:  Monitor platelets by anticoagulation protocol: Yes   Plan:  1) Apixaban 5mg  po bid  Deboraha Sprang 12/29/2016,10:34 AM

## 2016-12-30 LAB — BASIC METABOLIC PANEL
Anion gap: 9 (ref 5–15)
BUN: 35 mg/dL — AB (ref 6–20)
CALCIUM: 9.1 mg/dL (ref 8.9–10.3)
CO2: 37 mmol/L — ABNORMAL HIGH (ref 22–32)
CREATININE: 1.43 mg/dL — AB (ref 0.61–1.24)
Chloride: 91 mmol/L — ABNORMAL LOW (ref 101–111)
GFR calc Af Amer: 60 mL/min — ABNORMAL LOW (ref >60)
GFR, EST NON AFRICAN AMERICAN: 51 mL/min — AB (ref >60)
Glucose, Bld: 96 mg/dL (ref 65–99)
POTASSIUM: 4.6 mmol/L (ref 3.5–5.1)
SODIUM: 137 mmol/L (ref 135–145)

## 2016-12-30 LAB — GLUCOSE, CAPILLARY
GLUCOSE-CAPILLARY: 129 mg/dL — AB (ref 65–99)
GLUCOSE-CAPILLARY: 119 mg/dL — AB (ref 65–99)
GLUCOSE-CAPILLARY: 124 mg/dL — AB (ref 65–99)

## 2016-12-30 LAB — CBC
HCT: 33.7 % — ABNORMAL LOW (ref 39.0–52.0)
Hemoglobin: 9.6 g/dL — ABNORMAL LOW (ref 13.0–17.0)
MCH: 28.3 pg (ref 26.0–34.0)
MCHC: 28.5 g/dL — ABNORMAL LOW (ref 30.0–36.0)
MCV: 99.4 fL (ref 78.0–100.0)
PLATELETS: 275 10*3/uL (ref 150–400)
RBC: 3.39 MIL/uL — ABNORMAL LOW (ref 4.22–5.81)
RDW: 15.4 % (ref 11.5–15.5)
WBC: 5.1 10*3/uL (ref 4.0–10.5)

## 2016-12-30 LAB — PROTIME-INR
INR: 1.52
PROTHROMBIN TIME: 18.2 s — AB (ref 11.4–15.2)

## 2016-12-30 MED ORDER — ROPINIROLE HCL 0.25 MG PO TABS
0.2500 mg | ORAL_TABLET | Freq: Once | ORAL | Status: AC
  Administered 2016-12-30: 0.25 mg via ORAL
  Filled 2016-12-30: qty 1

## 2016-12-30 NOTE — Progress Notes (Signed)
Progress Note  Patient Name: Darrell Black Date of Encounter: 12/30/2016  Primary Cardiologist: Dr. Kate Sable  Subjective   No chest pain or breathlessness at rest. No palpitations.  Inpatient Medications    Scheduled Meds: . amiodarone  200 mg Oral Daily  . apixaban  5 mg Oral BID  . atorvastatin  80 mg Oral q1800  . bisacodyl  10 mg Rectal Once  . carvedilol  3.125 mg Oral BID WC  . docusate sodium  100 mg Oral BID  . furosemide  40 mg Oral Daily  . gabapentin  100 mg Oral BID  . insulin aspart  0-15 Units Subcutaneous TID WC  . rOPINIRole  1 mg Oral QHS  . senna  1 tablet Oral BID  . sodium chloride flush  10-40 mL Intracatheter Q12H   Continuous Infusions: . ferumoxytol     PRN Meds: acetaminophen, albuterol, metoprolol tartrate, nitroGLYCERIN, ondansetron **OR** ondansetron (ZOFRAN) IV, sodium chloride flush   Vital Signs    Vitals:   12/30/16 0355 12/30/16 0605 12/30/16 0743 12/30/16 1141  BP: 116/70  117/79 105/74  Pulse:   76 69  Resp:   (!) 26 (!) 24  Temp: 97.7 F (36.5 C)  98 F (36.7 C) 98.6 F (37 C)  TempSrc: Oral  Oral Oral  SpO2: 94%  97% 97%  Weight:  (!) 323 lb 13.7 oz (146.9 kg)    Height:        Intake/Output Summary (Last 24 hours) at 12/30/16 1225 Last data filed at 12/30/16 0840  Gross per 24 hour  Intake              240 ml  Output              400 ml  Net             -160 ml   Filed Weights   12/28/16 0528 12/29/16 0342 12/30/16 0605  Weight: (!) 328 lb 11.2 oz (149.1 kg) (!) 329 lb 4.8 oz (149.4 kg) (!) 323 lb 13.7 oz (146.9 kg)    Telemetry    Rate controlled atrial fibrillation. Personally reviewed.  Physical Exam   GEN: Morbid obese. No acute distress.   Neck: No JVD. Cardiac: Irregularly irregular, no gallop.  Respiratory: Nonlabored. Clear to auscultation bilaterally. GI: Soft, nontender, bowel sounds present. MS: 2+ edema; No deformity. Neuro:  Nonfocal. Psych: Alert and oriented x 3. Normal  affect.  Labs    Chemistry Recent Labs Lab 12/26/16 0413 12/27/16 0525 12/28/16 0500 12/29/16 0500 12/30/16 0448  NA 133* 135 136 136 137  K 5.2* 4.8 4.9 4.5 4.6  CL 90* 90* 93* 91* 91*  CO2 35* 35* 32 37* 37*  GLUCOSE 106* 107* 107* 115* 96  BUN 43* 38* 34* 34* 35*  CREATININE 2.64* 2.05* 1.60* 1.49* 1.43*  CALCIUM 8.2* 8.6* 8.6* 8.9 9.1  ALBUMIN 3.1* 3.3*  --   --   --   GFRNONAA 25* 33* 45* 49* 51*  GFRAA 28* 39* 52* 57* 60*  ANIONGAP 8 10 11 8 9      Hematology Recent Labs Lab 12/27/16 0525 12/29/16 0500 12/30/16 0448  WBC 5.0 5.4 5.1  RBC 3.48* 3.45* 3.39*  HGB 9.8* 9.7* 9.6*  HCT 33.4* 33.9* 33.7*  MCV 96.0 98.3 99.4  MCH 28.2 28.1 28.3  MCHC 29.3* 28.6* 28.5*  RDW 15.2 15.4 15.4  PLT 274 288 275    Radiology    No results found.  Cardiac Studies  Echocardiogram 12/12/2016: Study Conclusions  - Procedure narrative: Contrast enhancement employed. - Left ventricle: The cavity size was normal. Wall thickness was increased in a pattern of mild LVH. Systolic function was moderately reduced. The estimated ejection fraction was in the range of 35% to 40%. Diffuse hypokinesis. The study was not technically sufficient to allow evaluation of LV diastolic dysfunction due to atrial fibrillation. - Regional wall motion abnormality: Akinesis of the apical septal and apical myocardium; moderate hypokinesis of the basal-mid anteroseptal myocardium. - Aortic valve: Mildly calcified leaflets. - Mitral valve: Moderately calcified annulus. Normal thickness leaflets . - Left atrium: The atrium was mildly dilated. - Right ventricle: Pacer wire or catheter noted in right ventricle. Systolic function was mildly reduced. - Tricuspid valve: There was mild regurgitation. - Pulmonary arteries: PA peak pressure: 38 mm Hg (S). - Systemic veins: IVC is dilated wtih normal respiratory variation. Estimated CVP 8 mmHg.  Patient Profile     61 y.o.  male with history of dilated cardiomyopathy with chronic systolic heart failure, permanent atrial fibrillation, recent GI bleed, recent fall, being managed for acute on chronic systolic heart failure in the setting of acute on chronic renal failure.  Assessment & Plan    1. Acute on chronic systolic heart failure. Now on oral Lasix with more gentle diuresis after substantial weight loss on IV diuretics.  2. Dilated cardiomyopathy with LVEF 35-40%. Continues on Coreg. No ACE inhibitor, ARB, or Aldactone with recent fluctuating renal insufficiency.  3. Acute renal insufficiency, creatinine has improved significantly, now 1.43.  4. Chronic atrial fibrillation. Heart rate-controlled on current regimen. Eliquis has been initiated for stroke prophylaxis.  5. CAD with previous PCI and CABG. No angina symptoms. Continues on statin therapy.  6. ICD in place.  7. Deconditioning with plan for rehabilitation.  No changes made present medical regimen. Continue amiodarone, Coreg, Eliquis, Lipitor, and Lasix.  Signed, Rozann Lesches, MD  12/30/2016, 12:25 PM

## 2016-12-30 NOTE — Progress Notes (Signed)
PROGRESS NOTE    Darrell Black  XBD:[id number] DOB: 06-Mar-1956 DOA: 12/10/2016 PCP: Renata Caprice, DO     Brief Narrative:  61 year old male who presented with dyspnea. He does have significant past medical history of atrial fibrillation, heart failure, coronary artery disease, ischemic cardiomyopathy, type 2 diabetes mellitus, dyslipidemia, hypertension, morbid obesity, chronic kidney disease and depression. Reported worsening hallucinations for last 4 days prior to hospitalization, his psychotropic agents have been modified as an outpatient, he developed acute dyspnea about 12 hours prior to admission. On his initial physical examination blood pressure 111/72, heart rate 85, respiratory rate 23 to 28, oxygen saturation 89-94%. He was pale, multiple mucosal bleeding abrasion, decreased breath sounds at bases bilaterally, heart S1-S2 present irregularly irregular, obese abdomen, no lower extremity edema. Patient was somnolent but easily aroused. Arterial blood gas 7.32/ 84.2/ 59.7/ 37.8/ 85.2%, on 4 L per nasal. Chest x-ray with hyperinflation, right rotation, positive cardiomegaly, small bilateral pleural effusions and vascular congestion. EKG with atrial fibrillation, poor R-wave progression, left bundle-branch block, occasional ventricular pacing.   Patient was admitted to the hospital working diagnosis acute hypoxic/hypercapnic respiratory failure due to cardiogenic pulmonary edema due to acute on chronic decompensated systolic heart failure.   Patient required noninvasive mechanical ventilation, aggressive diureses. This hospitalization was complicated by lower GI bleed, and psychosis.  As of 10/20 he is much improved. Plan to transition to PO lasix. If stable, can DC SNF over next 24-48 hours (per SW SNF unable to accept over weekend).   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 Acute hypoxic/hypercapnic respiratory failure. Multifactorial: OSA/OHS, acute systolic CHF. Patient has been using intermittently bipap. Dyspnea and mentation continue to improve and are now at baseline. SNF aware that patient uses BIPAP at night and for naps and are ok with it.    2. Metabolic/toxic encephalopathy. Resolved and at baseline. Likely due to acute respiratory failure and psychosis.  3. Acute on chronic systolic heart failure decompensation. ECHO with EF 35-40% and diffuse hypokinesis. Cardiology on board. Total 8.6 L negative since admission. Continue lasix 40 mg PO daily today and observe diuresis and renal function overnight in anticipation of DC over next 24 hours.  4. Cellulitis, lower extremities. Resolved, no antibiotic therapy. Doubt true cellulitis.  5. Acute kidney injury on chronic kidney disease stage 3. Suspect due to rapid diuresis and CHF. Renal function with improvement in serum cr down to 1.43 on 10/21. Appreciate nephrology input and recommendations.  6. Chronic atrial fibrillation. Rate controlled with amiodarone and coreg, heart rate in the 70's. Has been on warfarin, still INR not therapeutic. Has been cleared by GI to start anticoagulation and as renal function is improving will place on Eliquis. Discussed with pharmacy.  7. Diabetes mellitus type 2. Well controlled. Continue current regimen.  8. Morbid obesity.  Physical therapy, out of bed as tolerated.   9. Lower GI bleed. Hb  and hct stable with no  signs of active bleeding, will continue to hold on anticoagulation.     DVT prophylaxis: Warfarin, transition to eliquis today Code Status: Full Code Family Communication: Patient only Disposition Plan: SNF in 24 hours  Consultants:   Cardiology  Nephrology  Procedures:   ECHO as above  Antimicrobials:  Anti-infectives    Start     Dose/Rate Route Frequency Ordered Stop   12/21/16 1000  sulfamethoxazole-trimethoprim (BACTRIM DS,SEPTRA DS) 800-160 MG per tablet 1 tablet  Status:  Discontinued     1 tablet Oral Every 12 hours 12/21/16 0828 12/24/16 1306   12/20/16 1345  doxycycline (VIBRA-TABS) tablet 100 mg  Status:  Discontinued     100 mg Oral Every 12 hours 12/20/16 1342 12/21/16 0828   12/16/16 1300  ceFAZolin (ANCEF) IVPB 1 g/50 mL premix  Status:  Discontinued     1 g 100 mL/hr over 30 Minutes Intravenous Every 8 hours 12/16/16 1016 12/20/16 1342   12/15/16 1000  cefTRIAXone (ROCEPHIN) 1 g in dextrose 5 % 50 mL IVPB  Status:  Discontinued     1 g 100 mL/hr over 30 Minutes Intravenous Every 24 hours 12/15/16 0756 12/16/16 1016   12/11/16 0000  sulfamethoxazole-trimethoprim (BACTRIM DS,SEPTRA DS) 800-160 MG per tablet 1 tablet  Status:  Discontinued     1 tablet Oral 2 times daily 12/10/16 2350 12/12/16 0700       Subjective: No complaints. Anxious to "get going to rehab place".  Objective: Vitals:   12/30/16 0009 12/30/16 0355 12/30/16 0605 12/30/16 0743  BP: 112/81 116/70  117/79  Pulse:    76  Resp:    (!) 26  Temp: 98.5 F (36.9 C) 97.7 F (36.5 C)  98 F (36.7 C)  TempSrc: Oral Oral  Oral  SpO2: 100% 94%  97%  Weight:   (!) 146.9 kg (323 lb 13.7 oz)   Height:        Intake/Output Summary (Last 24 hours) at 12/30/16 1133 Last data filed at 12/30/16 0840  Gross per 24 hour  Intake              240 ml  Output              400 ml  Net             -160 ml   Filed Weights   12/28/16 0528 12/29/16 0342 12/30/16 0605  Weight: (!) 149.1 kg (328 lb  11.2 oz) (!) 149.4 kg (329 lb 4.8 oz) (!) 146.9 kg (323 lb 13.7 oz)    Examination:  General exam: Alert, awake, oriented x 3, obese Respiratory system: Clear to auscultation. Respiratory effort normal. Cardiovascular system:RRR. No murmurs, rubs, gallops. Gastrointestinal system: Abdomen is nondistended, soft and nontender. No organomegaly or masses felt. Normal bowel sounds heard. Central nervous system: Alert and oriented. No focal neurological deficits. Extremities: No C/C/E, +pedal pulses Skin: No rashes, lesions or ulcers Psychiatry: Judgement and insight appear normal. Mood & affect appropriate.       Data Reviewed: I have personally reviewed following labs and imaging studies  CBC:  Recent Labs Lab 12/25/16 0401 12/26/16 0413 12/27/16 0525 12/29/16 0500 12/30/16 0448  WBC 6.5 6.6 5.0 5.4 5.1  HGB 9.7* 9.5* 9.8* 9.7* 9.6*  HCT 33.3* 31.7* 33.4* 33.9* 33.7*  MCV 96.0 94.1 96.0 98.3 99.4  PLT 246 263 274 288 696   Basic Metabolic Panel:  Recent Labs Lab 12/26/16 0413 12/27/16 0525 12/28/16 0500 12/29/16  0500 12/30/16 0448  NA 133* 135 136 136 137  K 5.2* 4.8 4.9 4.5 4.6  CL 90* 90* 93* 91* 91*  CO2 35* 35* 32 37* 37*  GLUCOSE 106* 107* 107* 115* 96  BUN 43* 38* 34* 34* 35*  CREATININE 2.64* 2.05* 1.60* 1.49* 1.43*  CALCIUM 8.2* 8.6* 8.6* 8.9 9.1  PHOS 4.4 4.5  --   --   --    GFR: Estimated Creatinine Clearance: 76.6 mL/min (A) (by C-G formula based on SCr of 1.43 mg/dL (H)). Liver Function Tests:  Recent Labs Lab 12/26/16 0413 12/27/16 0525  ALBUMIN 3.1* 3.3*   No results for input(s): LIPASE, AMYLASE in the last 168 hours.  Recent Labs Lab 12/24/16 1800  AMMONIA 30   Coagulation Profile:  Recent Labs Lab 12/29/16 0500 12/30/16 0448  INR 1.19 1.52   Cardiac Enzymes: No results for input(s): CKTOTAL, CKMB, CKMBINDEX, TROPONINI in the last 168 hours. BNP (last 3 results) No results for input(s): PROBNP in the last 8760  hours. HbA1C: No results for input(s): HGBA1C in the last 72 hours. CBG:  Recent Labs Lab 12/28/16 0602 12/28/16 1125 12/28/16 1617 12/28/16 2104 12/29/16 0607  GLUCAP 106* 123* 151* 138* 103*   Lipid Profile: No results for input(s): CHOL, HDL, LDLCALC, TRIG, CHOLHDL, LDLDIRECT in the last 72 hours. Thyroid Function Tests: No results for input(s): TSH, T4TOTAL, FREET4, T3FREE, THYROIDAB in the last 72 hours. Anemia Panel: No results for input(s): VITAMINB12, FOLATE, FERRITIN, TIBC, IRON, RETICCTPCT in the last 72 hours. Urine analysis:    Component Value Date/Time   COLORURINE YELLOW 12/24/2016 1316   APPEARANCEUR CLEAR 12/24/2016 1316   LABSPEC 1.018 12/24/2016 1316   PHURINE 5.0 12/24/2016 1316   GLUCOSEU NEGATIVE 12/24/2016 1316   HGBUR NEGATIVE 12/24/2016 1316   BILIRUBINUR NEGATIVE 12/24/2016 1316   KETONESUR NEGATIVE 12/24/2016 1316   PROTEINUR NEGATIVE 12/24/2016 1316   NITRITE NEGATIVE 12/24/2016 1316   LEUKOCYTESUR NEGATIVE 12/24/2016 1316   Sepsis Labs: @LABRCNTIP (procalcitonin:4,lacticidven:4)  )No results found for this or any previous visit (from the past 240 hour(s)).       Radiology Studies: No results found.      Scheduled Meds: . amiodarone  200 mg Oral Daily  . apixaban  5 mg Oral BID  . atorvastatin  80 mg Oral q1800  . bisacodyl  10 mg Rectal Once  . carvedilol  3.125 mg Oral BID WC  . docusate sodium  100 mg Oral BID  . furosemide  40 mg Oral Daily  . gabapentin  100 mg Oral BID  . insulin aspart  0-15 Units Subcutaneous TID WC  . rOPINIRole  1 mg Oral QHS  . senna  1 tablet Oral BID  . sodium chloride flush  10-40 mL Intracatheter Q12H  . sodium polystyrene  30 g Oral Once   Continuous Infusions: . ferumoxytol       LOS: 18 days    Time spent: 25 minutes. Greater than 50% of this time was spent in direct contact with the patient coordinating care.     Lelon Frohlich, MD [facility] Pager  [phone]  If 7PM-7AM, please contact night-coverage [email or web address] Password [email or web address] 12/30/2016, 11:33 AM

## 2016-12-31 LAB — BASIC METABOLIC PANEL
Anion gap: 7 (ref 5–15)
BUN: 33 mg/dL — AB (ref 6–20)
CHLORIDE: 92 mmol/L — AB (ref 101–111)
CO2: 39 mmol/L — ABNORMAL HIGH (ref 22–32)
Calcium: 9 mg/dL (ref 8.9–10.3)
Creatinine, Ser: 1.31 mg/dL — ABNORMAL HIGH (ref 0.61–1.24)
GFR calc Af Amer: >60 mL/min (ref >60)
GFR calc non Af Amer: 57 mL/min — ABNORMAL LOW (ref >60)
GLUCOSE: 104 mg/dL — AB (ref 65–99)
Potassium: 4.2 mmol/L (ref 3.5–5.1)
SODIUM: 138 mmol/L (ref 135–145)

## 2016-12-31 LAB — GLUCOSE, CAPILLARY: Glucose-Capillary: 144 mg/dL — ABNORMAL HIGH (ref 65–99)

## 2016-12-31 LAB — PROTIME-INR
INR: 1.66
Prothrombin Time: 19.4 seconds — ABNORMAL HIGH (ref 11.4–15.2)

## 2016-12-31 MED ORDER — FUROSEMIDE 40 MG PO TABS: 40.0000 mg | ORAL_TABLET | Freq: Every day | ORAL | Status: DC

## 2016-12-31 MED ORDER — GABAPENTIN 100 MG PO CAPS: 100.0000 mg | ORAL_CAPSULE | Freq: Two times a day (BID) | ORAL | Status: AC

## 2016-12-31 MED ORDER — APIXABAN 5 MG PO TABS: 5.0000 mg | ORAL_TABLET | Freq: Two times a day (BID) | ORAL | Status: AC

## 2016-12-31 NOTE — Progress Notes (Signed)
Called report to [facility]. No further questions at this time. Pt waiting on PTAR.

## 2016-12-31 NOTE — Discharge Summary (Signed)
Physician Discharge Summary  Darrell Black OEV:[id number] DOB: 1955-04-16 DOA: 12/10/2016  PCP: Renata Caprice, DO  Admit date: 12/10/2016 Discharge date: 12/31/2016  Time spent: 45 minutes  Recommendations for Outpatient Follow-up:  -Will be discharged to SNF today. -Is using BiPAP at night and with naps.   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 Olevia Bowens on 10/3: Darrell Black is a 61 y.o. male with medical history significant of atherosclerosis, atrial fibrillation, chest pain, chronic combined systolic and diastolic heart failure, chronic pain, coronary artery disease, ischemic cardiomyopathy, type 2 diabetes, hyperlipidemia, hypertension, depression, generalized muscle weakness, morbid obesity, peripheral neuropathy, obstructive sleep apnea, chronic kidney disease, restless leg syndrome, sick euthyroid syndrome who was brought to the emergency department yesterday for progressively worse visual hallucinations for 4 days. He stated yesterday to Dr. Maryan Rued that since he was started on an antidepressant in June he has begun to have mild visual hallucinations, including spiders crawling on the  wall and curtains. He has progressed to the point that he thought that people were sitting in his room. He didn't feel threatened or bothered by them. His antidepressant was stopped Friday, but his symptoms have significantly worsens. He mentioned yesterday that he was hallucinating constantly and is afraid that he may not be discerning reality from delusion and could potentially hurt someone.   He was awaiting for transfer to a psychiatric facility, when last evening he began to have worsening dyspnea and seems to be sedated from his psychotropic medications. He has been having persistent bleeding from his mouth from multiple small lacerations and his oral mucosa since earlier. He was given swish and spit tranexamic acid, which has partially decrease his bleeding. However, he has required frequent suctioning to avoid aspiration. This has prevented the use of BiPAP ventilation, however he is satting well with supplemental oxygen via nasal cannula or mask.  ED Course: The patient was awaiting transfer to psych facility, then became dyspneic with cough, wheezing and rhonchi. He was given supplemental oxygen, furosemide 40 mg IVP and bronchodilators by Dr. Christy Gentles. BiPAP was initially consider after the oral bleeding momentarily subsided after he was given the tranexamic acid swish and spit. However, he was discontinue after the bleeding, although less than he was earlier has persisted.   Workup done yesterday evening and earlier today show an ABG with pH of 7.32, PCO2 of 84, PCO2 of 60 and bicarbonate of 37.8. Stat CBC was 6.0, hemoglobin 9.7 g/dL, which is similar to previous measurement, platelets 225. PT was 21.7 seconds and INR was 1.91. Sodium 139, potassium 3.7, chloride 89, bicarbonate 40 mmol/L. He is ammonia  level is 48 umol/L. chest radiograph shows cardiomegaly with vascular congestion.  Hospital Course:   1. Acute hypoxic/hypercapnic respiratory failure. Multifactorial: OSA/OHS, acute  systolic CHF. Patient has been using intermittently bipap. Dyspnea and mentation continue to improve and are now at baseline. SNF aware that patient uses BIPAP at night and for naps and are ok with it.    2. Metabolic/toxic encephalopathy. Resolved and at baseline. Likely due to acute respiratory failure and psychosis.  3. Acute on chronic systolic heart failure decompensation. ECHO with EF 35-40% and diffuse hypokinesis. Cardiology on board. Total 6.5 L negative since admission. Continue lasix 40 mg PO daily .  4. Cellulitis, lower extremities. Resolved, no antibiotic therapy.Doubt true cellulitis.  5. Acute kidney injury on chronic kidney disease stage 3. Suspect due to rapid diuresis and CHF. Renal function with improvement in serum cr down to 1.31 on DC. Appreciate nephrology input and recommendations.  6. Chronic atrial fibrillation. Rate controlled with amiodarone and coreg, heart rate in the 70's. Has been on warfarin, still INR not therapeutic. Has been cleared by GI to start anticoagulation and has been started on Eliquis.  7. Diabetes mellitus type 2. Well controlled. Continue current regimen.  8. Morbid obesity. Physical therapy, out of bed as tolerated.  9. Lower GI bleed.Seen by GI and cleared for anticoagulation.   Procedures:  None   Consultations:  Cardiology  Nephrology  CCM  Discharge Instructions  Discharge Instructions    Diet - low sodium heart healthy    Complete by:  As directed    Increase activity slowly    Complete by:  As directed    PICC line removal    Complete by:  As directed      Allergies as of 12/31/2016      Reactions   Ampicillin Rash   Clindamycin/lincomycin Rash   Penicillins Rash   Has patient had a PCN reaction causing immediate rash, facial/tongue/throat swelling, SOB or lightheadedness with hypotension: Yes Has patient had a PCN reaction causing severe rash involving mucus membranes or skin necrosis: No Has  patient had a PCN reaction that required hospitalization: No Has patient had a PCN reaction occurring within the last 10 years: No If all of the above answers are "NO", then may proceed with Cephalosporin use.      Medication List    STOP taking these medications   aspirin EC 81 MG tablet   bumetanide 2 MG tablet Commonly known as:  BUMEX   DULoxetine 30 MG capsule Commonly known as:  CYMBALTA   isosorbide mononitrate 60 MG 24 hr tablet Commonly known as:  IMDUR   magnesium oxide 400 MG tablet Commonly known as:  MAG-OX   oxycodone 5 MG capsule Commonly known as:  OXY-IR   spironolactone 12.5 mg Tabs tablet Commonly known as:  ALDACTONE   sulfamethoxazole-trimethoprim 800-160 MG tablet Commonly known as:  BACTRIM DS,SEPTRA DS     TAKE these medications   acetaminophen 325 MG tablet Commonly known as:  TYLENOL Take 650 mg by mouth every 6 (six) hours as needed.   amiodarone 200 MG tablet Commonly known as:  PACERONE Take 200 mg by mouth daily.   apixaban 5 MG Tabs tablet Commonly known as:  ELIQUIS Take 1 tablet (5 mg total) by mouth 2 (two) times daily.   atorvastatin 80 MG tablet Commonly known as:  LIPITOR Take 80 mg by mouth every evening.   carvedilol 3.125 MG tablet Commonly known as:  COREG Take 3.125 mg by  mouth 2 (two) times daily with a meal.   docusate sodium 100 MG capsule Commonly known as:  COLACE Take 100 mg by mouth daily.   furosemide 40 MG tablet Commonly known as:  LASIX Take 1 tablet (40 mg total) by mouth daily.   gabapentin 100 MG capsule Commonly known as:  NEURONTIN Take 1 capsule (100 mg total) by mouth 2 (two) times daily. What changed:  when to take this   guaifenesin 100 MG/5ML syrup Commonly known as:  ROBITUSSIN Take 200 mg by mouth 3 (three) times daily as needed for cough or congestion.   nitroGLYCERIN 0.4 MG SL tablet Commonly known as:  NITROSTAT Place 0.4 mg under the tongue every 5 (five) minutes as needed for  chest pain.   rOPINIRole 1 MG tablet Commonly known as:  REQUIP Take 1 mg by mouth at bedtime.   senna 8.6 MG Tabs tablet Commonly known as:  SENOKOT Take 1 tablet by mouth at bedtime.   tiotropium 18 MCG inhalation capsule Commonly known as:  SPIRIVA Place 18 mcg into inhaler and inhale daily.      Allergies  Allergen Reactions  . Ampicillin Rash  . Clindamycin/Lincomycin Rash  . Penicillins Rash    Has patient had a PCN reaction causing immediate rash, facial/tongue/throat swelling, SOB or lightheadedness with hypotension: Yes Has patient had a PCN reaction causing severe rash involving mucus membranes or skin necrosis: No Has patient had a PCN reaction that required hospitalization: No Has patient had a PCN reaction occurring within the last 10 years: No If all of the above answers are "NO", then may proceed with Cephalosporin use.       The results of significant diagnostics from this hospitalization (including imaging, microbiology, ancillary and laboratory) are listed below for reference.    Significant Diagnostic Studies: Dg Chest 2 View  Result Date: 12/12/2016 CLINICAL DATA:  Shortness of breath and cough. History of atrial fibrillation, coronary artery disease, hypertension, ischemic cardiomyopathy, heart failure. EXAM: CHEST  2 VIEW COMPARISON:  12/11/2016 FINDINGS: Postoperative changes in the mediastinum. Cardiac pacemaker. Shallow inspiration with linear atelectasis in the lung bases. Cardiac enlargement. Vascular congestion. Small bilateral pleural effusions. No significant changes since previous study. No pneumothorax. IMPRESSION: Stable appearance of the chest since previous study. Again demonstrated is cardiac enlargement with pulmonary vascular congestion and bilateral pleural effusions. Shallow inspiration with atelectasis in the lung bases. Electronically Signed   By: Lucienne Capers M.D.   On: 12/12/2016 00:05   Ct Head Wo Contrast  Result Date:  12/23/2016 CLINICAL DATA:  61 year old male status post fall. Headache. A fib. EXAM: CT HEAD WITHOUT CONTRAST TECHNIQUE: Contiguous axial images were obtained from the base of the skull through the vertex without intravenous contrast. COMPARISON:  11/22/2016 FINDINGS: Brain: Cerebral volume is normal for age. No midline shift, ventriculomegaly, mass effect, evidence of mass lesion, intracranial hemorrhage or evidence of cortically based acute infarction. Gray-white matter differentiation is within normal limits throughout the brain. Vascular: Calcified atherosclerosis at the skull base. No suspicious intracranial vascular hyperdensity. Skull: No acute osseous abnormality identified. Sinuses/Orbits: Visualized paranasal sinuses and mastoids are stable and well pneumatized. Other: Decreased scalp soft tissue stranding and swelling posteriorly near midline (series 4, image 52. Otherwise visible orbits and scalp soft tissues are within normal limits. IMPRESSION: 1. Stable and normal for age noncontrast CT appearance of the brain. 2. Mild regression of left posterior scalp contusion. Electronically Signed   By: Genevie Ann M.D.   On: 12/23/2016 15:43  Ct Head Wo Contrast  Result Date: 12/22/2016 CLINICAL DATA:  Status post fall.  Hit head.  Initial encounter. EXAM: CT HEAD WITHOUT CONTRAST TECHNIQUE: Contiguous axial images were obtained from the base of the skull through the vertex without intravenous contrast. COMPARISON:  None. FINDINGS: Brain: No evidence of acute infarction, hemorrhage, hydrocephalus, extra-axial collection or mass lesion/mass effect. The posterior fossa, including the cerebellum, brainstem and fourth ventricle, is within normal limits. The third and lateral ventricles, and basal ganglia are unremarkable in appearance. The cerebral hemispheres are symmetric in appearance, with normal gray-white differentiation. No mass effect or midline shift is seen. Vascular: No hyperdense vessel or unexpected  calcification. Skull: There is no evidence of fracture; multiple maxillary dental caries are noted. Sinuses/Orbits: The orbits are within normal limits. There is mild partial opacification of the left mastoid air cells. The paranasal sinuses and right mastoid air cells are well-aerated. Other: Mild soft tissue swelling is noted at the occiput. IMPRESSION: 1. No evidence of traumatic intracranial injury or fracture. 2. Mild soft tissue swelling at the occiput. 3. Multiple maxillary dental caries noted. 4. Mild partial opacification of the left mastoid air cells. Electronically Signed   By: Garald Balding M.D.   On: 12/22/2016 03:28   US Renal  Result Date: 12/25/2016 CLINICAL DATA:  61 year old male with acute renal failure EXAM: RENAL / URINARY TRACT ULTRASOUND COMPLETE COMPARISON:  None. FINDINGS: Right Kidney: Length: 11.2 cm. Echogenicity within normal limits. No mass or hydronephrosis visualized. Left Kidney: Length: 10.6 cm. Echogenicity within normal limits. No mass or hydronephrosis visualized. Bladder: The bladder was decompressed. IMPRESSION: Normal renal ultrasound. Electronically Signed   By: Jacqulynn Cadet M.D.   On: 12/25/2016 11:47   Dg Chest Port 1 View  Result Date: 12/24/2016 CLINICAL DATA:  Dyspnea EXAM: PORTABLE CHEST 1 VIEW COMPARISON:  Chest radiograph 12/17/2016 FINDINGS: Left chest wall AICD leads are unchanged in position. Sequelae of median sternotomy. Unchanged cardiomegaly. Small right pleural effusion with associated atelectasis, unchanged. No new region of consolidation. Unchanged left basilar atelectasis. Persistent pulmonary vascular congestion. IMPRESSION: Unchanged small right pleural effusion and associated atelectasis. Pulmonary vascular congestion and left basilar atelectasis without overt pulmonary edema. Electronically Signed   By: Ulyses Jarred M.D.   On: 12/24/2016 14:52   Dg Chest Port 1 View  Result Date: 12/17/2016 CLINICAL DATA:  61 year old male with  respiratory failure. Shortness of breath. EXAM: PORTABLE CHEST 1 VIEW COMPARISON:  12/16/2016 and earlier. FINDINGS: Portable AP semi upright view at at 0402 hours. Stable right PICC line. Stable lung volumes. Stable cardiomegaly and mediastinal contours. Stable left chest cardiac AICD. Veiling opacity at the right lung base. Continued increased pulmonary vascularity. Mildly improved retrocardiac ventilation on the left with residual peribronchial opacity. No pneumothorax. Negative visible bowel gas pattern. IMPRESSION: 1. Right pleural effusion suspected with stable increased bilateral interstitial opacity. Favor interstitial edema over viral/atypical respiratory infection. 2. Regressed but not resolved left lung base atelectasis. Electronically Signed   By: Genevie Ann M.D.   On: 12/17/2016 07:09   Dg Chest Port 1 View  Result Date: 12/16/2016 CLINICAL DATA:  Hypoxia an acute respiratory failure. EXAM: PORTABLE CHEST 1 VIEW COMPARISON:  12/15/2016 FINDINGS: Pacemaker/AICD remains in place. Previous median sternotomy. Right arm PICC tip in the SVC just above the right atrium. Interstitial and alveolar edema persist, with a small amount of pleural fluid on the right. IMPRESSION: Persistent congestive heart failure with pulmonary edema. Electronically Signed   By: Jan Fireman.D.  On: 12/16/2016 11:45   Dg Chest Port 1 View  Result Date: 12/15/2016 CLINICAL DATA:  Congestive heart failure EXAM: PORTABLE CHEST 1 VIEW COMPARISON:  12/14/2016 FINDINGS: Postoperative changes in the mediastinum. Cardiac pacemaker. Shallow inspiration with atelectasis in the lung bases. Cardiac enlargement. Perihilar infiltration likely represents edema although multifocal pneumonia could also have this appearance. Can't exclude a small right pleural effusion. No apparent pneumothorax. Similar appearance to previous study, lying for differences in technique appear IMPRESSION: Cardiac enlargement with bilateral perihilar infiltrates  likely edema. Shallow inspiration with atelectasis in the lung bases. Electronically Signed   By: Lucienne Capers M.D.   On: 12/15/2016 21:10   Dg Chest Port 1 View  Result Date: 12/11/2016 CLINICAL DATA:  Shortness of breath. History of diabetes and hypertension. Nonsmoker. EXAM: PORTABLE CHEST 1 VIEW COMPARISON:  None. FINDINGS: Postoperative changes in the mediastinum. Cardiac pacemaker. Shallow inspiration with atelectasis in the lung bases. Cardiac enlargement with mild vascular congestion. No definite edema. Probable small pleural effusions. No pneumothorax. IMPRESSION: Cardiac enlargement with vascular congestion and small bilateral pleural effusions. Atelectasis in the lung bases. Electronically Signed   By: Lucienne Capers M.D.   On: 12/11/2016 05:19   Dg Chest Port 1v Same Day  Result Date: 12/14/2016 CLINICAL DATA:  Dyspnea. EXAM: PORTABLE CHEST 1 VIEW COMPARISON:  12/11/2016. FINDINGS: Low lung volumes. Semierect portable technique. Marked enlargement of cardiac silhouette. Dual lead pacing device. BILATERAL pulmonary opacities are noted, worse from priors, representing either pulmonary edema or BILATERAL pneumonia. IMPRESSION: Worsening aeration.  See discussion above. Electronically Signed   By: Staci Righter M.D.   On: 12/14/2016 12:09    Microbiology: No results found for this or any previous visit (from the past 240 hour(s)).   Labs: Basic Metabolic Panel:  Recent Labs Lab 12/26/16 0413 12/27/16 0525 12/28/16 0500 12/29/16 0500 12/30/16 0448 12/31/16 0431  NA 133* 135 136 136 137 138  K 5.2* 4.8 4.9 4.5 4.6 4.2  CL 90* 90* 93* 91* 91* 92*  CO2 35* 35* 32 37* 37* 39*  GLUCOSE 106* 107* 107* 115* 96 104*  BUN 43* 38* 34* 34* 35* 33*  CREATININE 2.64* 2.05* 1.60* 1.49* 1.43* 1.31*  CALCIUM 8.2* 8.6* 8.6* 8.9 9.1 9.0  PHOS 4.4 4.5  --   --   --   --    Liver Function Tests:  Recent Labs Lab 12/26/16 0413 12/27/16 0525  ALBUMIN 3.1* 3.3*   No results for  input(s): LIPASE, AMYLASE in the last 168 hours.  Recent Labs Lab 12/24/16 1800  AMMONIA 30   CBC:  Recent Labs Lab 12/25/16 0401 12/26/16 0413 12/27/16 0525 12/29/16 0500 12/30/16 0448  WBC 6.5 6.6 5.0 5.4 5.1  HGB 9.7* 9.5* 9.8* 9.7* 9.6*  HCT 33.3* 31.7* 33.4* 33.9* 33.7*  MCV 96.0 94.1 96.0 98.3 99.4  PLT 246 263 274 288 275   Cardiac Enzymes: No results for input(s): CKTOTAL, CKMB, CKMBINDEX, TROPONINI in the last 168 hours. BNP: BNP (last 3 results)  Recent Labs  12/11/16 2330  BNP 583.0*    ProBNP (last 3 results) No results for input(s): PROBNP in the last 8760 hours.  CBG:  Recent Labs Lab 12/28/16 2104 12/29/16 0607 12/30/16 1138 12/30/16 1637 12/30/16 2102  GLUCAP 138* 103* 129* 119* 124*       Signed:  HERNANDEZ Black,Darrell Toth  [facility] Pager: [phone] 12/31/2016, 10:53 AM

## 2016-12-31 NOTE — NC FL2 (Signed)
[place] LEVEL OF CARE SCREENING TOOL     IDENTIFICATION  Patient Name: Darrell Black Birthdate: 1955/08/24 Sex: male Admission Date (Current Location): 12/10/2016  [facility] and [place] Number:  Manufacturing engineer and Address:  [facility], [place] [address] [place], [place] [address]      Provider Number: [id number]  Attending Physician Name and Address:  Isaac Bliss, Olam Idler*  Relative Name and Phone Number:       Current Level of Care: Hospital Recommended Level of Care: [place] Prior Approval Number:    Date Approved/Denied:   PASRR Number: [id number]  Discharge Plan: SNF    Current Diagnoses: Patient Active Problem List   Diagnosis Date Noted  . Polyp of sigmoid colon   . Morbid obesity due to excess calories ([place]) 12/18/2016  . Hypernatremia 12/18/2016  . Rectal bleeding   . Acute on chronic respiratory failure with hypoxia and hypercapnia (HCC)   . Visual hallucination   . Acute systolic congestive heart failure ([place])   . Acute on chronic systolic congestive heart failure ([place])   . 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 Acute respiratory insufficiency 12/12/2016  . Obstructive sleep apnea 12/12/2016  . Acute respiratory failure with hypoxia and hypercarbia ([place]) 12/12/2016  . Acute psychosis ([place]) 12/12/2016  . Atrial fibrillation ([place]) 12/12/2016  . Oral bleeding 12/12/2016  . Coronary artery disease 12/12/2016  . Restless leg syndrome 12/12/2016  . Hypertension 12/12/2016  . Hyperlipidemia 12/12/2016  . Chronic combined systolic and diastolic heart failure ([place]) 12/12/2016  . Hyperammonemia ([place]) 12/12/2016    Orientation RESPIRATION BLADDER Height & Weight     Self, Time, Situation, Place  O2 (4L [place]) Continent Weight: (!) 327 lb 12.8 oz (148.7 kg) Height:  5\' 8"  (172.7 cm)  BEHAVIORAL SYMPTOMS/MOOD NEUROLOGICAL BOWEL  NUTRITION STATUS      Continent Diet (see DC summary)  AMBULATORY STATUS COMMUNICATION OF NEEDS Skin   Limited Assist Verbally Normal                       Personal Care Assistance Level of Assistance  Bathing, Dressing Bathing Assistance: Limited assistance   Dressing Assistance: Limited assistance     Functional Limitations Info  Sight, Hearing, Speech Sight Info: Impaired (wears glasses) Hearing Info: Adequate Speech Info: Adequate    SPECIAL CARE FACTORS FREQUENCY  PT (By licensed PT), OT (By licensed OT)     PT Frequency: 5x wk OT Frequency: 5x wk            Contractures Contractures Info: Not present    Additional Factors Info  Code Status, Allergies, Insulin Sliding Scale Code Status Info: FULL Allergies Info: Ampicillin, Clindamycin/lincomycin, Penicillins   Insulin Sliding Scale Info: 3/day       Current Medications (12/31/2016):  This is the current hospital active medication list Current Facility-Administered Medications  Medication Dose Route Frequency Provider Last Rate Last Dose  . acetaminophen (TYLENOL) tablet 650 mg  650 mg Oral Q6H PRN Opyd, Ilene Qua, MD   650 mg at 12/30/16 1411  . albuterol (PROVENTIL) (2.5 MG/3ML) 0.083% nebulizer solution 2.5 mg  2.5 mg Nebulization Q2H PRN Reubin Milan, MD   2.5 mg at 12/30/16 1508  . amiodarone (PACERONE) tablet 200 mg  200 mg Oral Daily Jennelle Human B, NP   200 mg at 12/31/16 0949  . apixaban (ELIQUIS) tablet 5  mg  5 mg Oral BID Otilio Miu, RPH   5 mg at 12/31/16 6803  . atorvastatin (LIPITOR) tablet 80 mg  80 mg Oral q1800 Jennelle Human B, NP   80 mg at 12/30/16 1654  . bisacodyl (DULCOLAX) suppository 10 mg  10 mg Rectal Once Hammonds, Sharyn Blitz, MD      . carvedilol (COREG) tablet 3.125 mg  3.125 mg Oral BID WC Jennelle Human B, NP   3.125 mg at 12/31/16 0949  . docusate sodium (COLACE) capsule 100 mg  100 mg Oral BID Hammonds, Sharyn Blitz, MD   100 mg at 12/30/16 2140  .  ferumoxytol (FERAHEME) 510 mg in sodium chloride 0.9 % 100 mL IVPB  510 mg Intravenous Once Lorella Nimrod, MD      . furosemide (LASIX) tablet 40 mg  40 mg Oral Daily Isaac Bliss, Rayford Halsted, MD   40 mg at 12/31/16 0949  . gabapentin (NEURONTIN) capsule 100 mg  100 mg Oral BID Regalado, Belkys A, MD   100 mg at 12/31/16 0949  . insulin aspart (novoLOG) injection 0-15 Units  0-15 Units Subcutaneous TID WC Regalado, Belkys A, MD   2 Units at 12/30/16 2140  . metoprolol tartrate (LOPRESSOR) injection 5 mg  5 mg Intravenous Q6H PRN Omar Person, NP      . nitroGLYCERIN (NITROSTAT) SL tablet 0.4 mg  0.4 mg Sublingual Q5 min PRN Reubin Milan, MD      . ondansetron [facility]) tablet 4 mg  4 mg Oral Q6H PRN Reubin Milan, MD       Or  . ondansetron [facility]) injection 4 mg  4 mg Intravenous Q6H PRN Reubin Milan, MD      . rOPINIRole (REQUIP) tablet 1 mg  1 mg Oral QHS Schorr, Rhetta Mura, NP   1 mg at 12/30/16 2140  . senna (SENOKOT) tablet 8.6 mg  1 tablet Oral BID Hammonds, Sharyn Blitz, MD   8.6 mg at 12/30/16 2122  . sodium chloride flush (NS) 0.9 % injection 10-40 mL  10-40 mL Intracatheter Q12H Oswald Hillock, MD   10 mL at 12/30/16 0827  . sodium chloride flush (NS) 0.9 % injection 10-40 mL  10-40 mL Intracatheter PRN Oswald Hillock, MD   10 mL at 12/30/16 2141     Discharge Medications: Please see discharge summary for a list of discharge medications.  Relevant Imaging Results:  Relevant Lab Results:   Additional Information SS# [id number] pt has BiBap (full face mask, 5L O2, set rate:12, Respiratory rate:19, [place], LCSW

## 2016-12-31 NOTE — NC FL2 (Signed)
[place] LEVEL OF CARE SCREENING TOOL     IDENTIFICATION  Patient Name: Darrell Black Birthdate: March 18, 1955 Sex: male Admission Date (Current Location): 12/10/2016  [facility] and [place] Number:  Manufacturing engineer and Address:  [facility], [place] [address], [place], [place] [address]      Provider Number: [id number]  Attending Physician Name and Address:  Isaac Bliss, Olam Idler*  Relative Name and Phone Number:       Current Level of Care: Hospital Recommended Level of Care: [place] Prior Approval Number:    Date Approved/Denied:   PASRR Number: [id number]  Discharge Plan: SNF    Current Diagnoses: Patient Active Problem List   Diagnosis Date Noted  . Polyp of sigmoid colon   . Morbid obesity due to excess calories ([place]) 12/18/2016  . Hypernatremia 12/18/2016  . Rectal bleeding   . Acute on chronic respiratory failure with hypoxia and hypercapnia (HCC)   . Visual hallucination   . Acute systolic congestive heart failure ([place])   . Acute on chronic systolic congestive heart failure ([place])   . 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 Acute respiratory insufficiency 12/12/2016  . Obstructive sleep apnea 12/12/2016  . Acute respiratory failure with hypoxia and hypercarbia ([place]) 12/12/2016  . Acute psychosis ([place]) 12/12/2016  . Atrial fibrillation ([place]) 12/12/2016  . Oral bleeding 12/12/2016  . Coronary artery disease 12/12/2016  . Restless leg syndrome 12/12/2016  . Hypertension 12/12/2016  . Hyperlipidemia 12/12/2016  . Chronic combined systolic and diastolic heart failure ([place]) 12/12/2016  . Hyperammonemia ([place]) 12/12/2016    Orientation RESPIRATION BLADDER Height & Weight     Self, Time, Situation, Place  O2 (4L [place]) Continent Weight: (!) 327 lb 12.8 oz (148.7 kg) Height:  5\' 8"  (172.7 cm)  BEHAVIORAL SYMPTOMS/MOOD NEUROLOGICAL BOWEL  NUTRITION STATUS      Continent Diet (see DC summary)  AMBULATORY STATUS COMMUNICATION OF NEEDS Skin   Limited Assist Verbally Normal                       Personal Care Assistance Level of Assistance  Bathing, Dressing Bathing Assistance: Limited assistance   Dressing Assistance: Limited assistance     Functional Limitations Info  Sight, Hearing, Speech Sight Info: Impaired (wears glasses) Hearing Info: Adequate Speech Info: Adequate    SPECIAL CARE FACTORS FREQUENCY  PT (By licensed PT), OT (By licensed OT)     PT Frequency: 5x wk OT Frequency: 5x wk            Contractures Contractures Info: Not present    Additional Factors Info  Code Status, Allergies, Insulin Sliding Scale Code Status Info: FULL Allergies Info: Ampicillin, Clindamycin/lincomycin, Penicillins   Insulin Sliding Scale Info: 3/day       Current Medications (12/31/2016):  This is the current hospital active medication list Current Facility-Administered Medications  Medication Dose Route Frequency Provider Last Rate Last Dose  . acetaminophen (TYLENOL) tablet 650 mg  650 mg Oral Q6H PRN Opyd, Ilene Qua, MD   650 mg at 12/30/16 1411  . albuterol (PROVENTIL) (2.5 MG/3ML) 0.083% nebulizer solution 2.5 mg  2.5 mg Nebulization Q2H PRN Reubin Milan, MD   2.5 mg at 12/30/16 1508  . amiodarone (PACERONE) tablet 200 mg  200 mg Oral Daily Jennelle Human B, NP   200 mg at 12/31/16 0949  . apixaban (ELIQUIS) tablet 5  mg  5 mg Oral BID Otilio Miu, RPH   5 mg at 12/31/16 7893  . atorvastatin (LIPITOR) tablet 80 mg  80 mg Oral q1800 Jennelle Human B, NP   80 mg at 12/30/16 1654  . bisacodyl (DULCOLAX) suppository 10 mg  10 mg Rectal Once Hammonds, Sharyn Blitz, MD      . carvedilol (COREG) tablet 3.125 mg  3.125 mg Oral BID WC Jennelle Human B, NP   3.125 mg at 12/31/16 0949  . docusate sodium (COLACE) capsule 100 mg  100 mg Oral BID Hammonds, Sharyn Blitz, MD   100 mg at 12/30/16 2140  .  ferumoxytol (FERAHEME) 510 mg in sodium chloride 0.9 % 100 mL IVPB  510 mg Intravenous Once Lorella Nimrod, MD      . furosemide (LASIX) tablet 40 mg  40 mg Oral Daily Isaac Bliss, Rayford Halsted, MD   40 mg at 12/31/16 0949  . gabapentin (NEURONTIN) capsule 100 mg  100 mg Oral BID Regalado, Belkys A, MD   100 mg at 12/31/16 0949  . insulin aspart (novoLOG) injection 0-15 Units  0-15 Units Subcutaneous TID WC Regalado, Belkys A, MD   2 Units at 12/30/16 2140  . metoprolol tartrate (LOPRESSOR) injection 5 mg  5 mg Intravenous Q6H PRN Omar Person, NP      . nitroGLYCERIN (NITROSTAT) SL tablet 0.4 mg  0.4 mg Sublingual Q5 min PRN Reubin Milan, MD      . ondansetron [facility]) tablet 4 mg  4 mg Oral Q6H PRN Reubin Milan, MD       Or  . ondansetron [facility]) injection 4 mg  4 mg Intravenous Q6H PRN Reubin Milan, MD      . rOPINIRole (REQUIP) tablet 1 mg  1 mg Oral QHS Schorr, Rhetta Mura, NP   1 mg at 12/30/16 2140  . senna (SENOKOT) tablet 8.6 mg  1 tablet Oral BID Hammonds, Sharyn Blitz, MD   8.6 mg at 12/30/16 8101  . sodium chloride flush (NS) 0.9 % injection 10-40 mL  10-40 mL Intracatheter Q12H Oswald Hillock, MD   10 mL at 12/30/16 0827  . sodium chloride flush (NS) 0.9 % injection 10-40 mL  10-40 mL Intracatheter PRN Oswald Hillock, MD   10 mL at 12/30/16 2141     Discharge Medications: Please see discharge summary for a list of discharge medications.  Relevant Imaging Results:  Relevant Lab Results:   Additional Information SS#: [id number]  Wende Neighbors, LCSW

## 2016-12-31 NOTE — NC FL2 (Signed)
[place] LEVEL OF CARE SCREENING TOOL     IDENTIFICATION  Patient Name: Darrell Black Birthdate: [birth year] Sex: male Admission Date (Current Location): 12/10/2016  [facility] and [place] Number:  Manufacturing engineer and Address:  [facility], [place] [address] [place], [place] [address]      Provider Number: [id number]  Attending Physician Name and Address:  Isaac Bliss, Olam Idler*  Relative Name and Phone Number:       Current Level of Care: Hospital Recommended Level of Care: [place] Prior Approval Number:    Date Approved/Denied:   PASRR Number: [id number]  Discharge Plan: SNF    Current Diagnoses: Patient Active Problem List   Diagnosis Date Noted  . Polyp of sigmoid colon   . Morbid obesity due to excess calories ([place]) 12/18/2016  . Hypernatremia 12/18/2016  . Rectal bleeding   . Acute on chronic respiratory failure with hypoxia and hypercapnia (HCC)   . Visual hallucination   . Acute systolic congestive heart failure ([place])   . Acute on chronic systolic congestive heart failure ([place])   . 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 Acute respiratory insufficiency 12/12/2016  . Obstructive sleep apnea 12/12/2016  . Acute respiratory failure with hypoxia and hypercarbia ([place]) 12/12/2016  . Acute psychosis ([place]) 12/12/2016  . Atrial fibrillation ([place]) 12/12/2016  . Oral bleeding 12/12/2016  . Coronary artery disease 12/12/2016  . Restless leg syndrome 12/12/2016  . Hypertension 12/12/2016  . Hyperlipidemia 12/12/2016  . Chronic combined systolic and diastolic heart failure ([place]) 12/12/2016  . Hyperammonemia ([place]) 12/12/2016    Orientation RESPIRATION BLADDER Height & Weight     Self, Time, Situation, Place  O2 (4L [place]) Continent Weight: (!) 327 lb 12.8 oz (148.7 kg) Height:  5\' 8"  (172.7 cm)  BEHAVIORAL SYMPTOMS/MOOD NEUROLOGICAL BOWEL  NUTRITION STATUS      Continent Diet (see DC summary)  AMBULATORY STATUS COMMUNICATION OF NEEDS Skin   Limited Assist Verbally Normal                       Personal Care Assistance Level of Assistance  Bathing, Dressing Bathing Assistance: Limited assistance   Dressing Assistance: Limited assistance     Functional Limitations Info  Sight, Hearing, Speech Sight Info: Impaired (wears glasses) Hearing Info: Adequate Speech Info: Adequate    SPECIAL CARE FACTORS FREQUENCY  PT (By licensed PT), OT (By licensed OT)     PT Frequency: 5x wk OT Frequency: 5x wk            Contractures Contractures Info: Not present    Additional Factors Info  Code Status, Allergies, Insulin Sliding Scale Code Status Info: FULL Allergies Info: Ampicillin, Clindamycin/lincomycin, Penicillins   Insulin Sliding Scale Info: 3/day       Current Medications (12/31/2016):  This is the current hospital active medication list Current Facility-Administered Medications  Medication Dose Route Frequency Provider Last Rate Last Dose  . acetaminophen (TYLENOL) tablet 650 mg  650 mg Oral Q6H PRN Opyd, Ilene Qua, MD   650 mg at 12/30/16 1411  . albuterol (PROVENTIL) (2.5 MG/3ML) 0.083% nebulizer solution 2.5 mg  2.5 mg Nebulization Q2H PRN Reubin Milan, MD   2.5 mg at 12/30/16 1508  . amiodarone (PACERONE) tablet 200 mg  200 mg Oral Daily Jennelle Human B, NP   200 mg at 12/31/16 0949  . apixaban (ELIQUIS) tablet 5  mg  5 mg Oral BID Otilio Miu, RPH   5 mg at 12/31/16 6440  . atorvastatin (LIPITOR) tablet 80 mg  80 mg Oral q1800 Jennelle Human B, NP   80 mg at 12/30/16 1654  . bisacodyl (DULCOLAX) suppository 10 mg  10 mg Rectal Once Hammonds, Sharyn Blitz, MD      . carvedilol (COREG) tablet 3.125 mg  3.125 mg Oral BID WC Jennelle Human B, NP   3.125 mg at 12/31/16 0949  . docusate sodium (COLACE) capsule 100 mg  100 mg Oral BID Hammonds, Sharyn Blitz, MD   100 mg at 12/30/16 2140  .  ferumoxytol (FERAHEME) 510 mg in sodium chloride 0.9 % 100 mL IVPB  510 mg Intravenous Once Lorella Nimrod, MD      . furosemide (LASIX) tablet 40 mg  40 mg Oral Daily Isaac Bliss, Rayford Halsted, MD   40 mg at 12/31/16 0949  . gabapentin (NEURONTIN) capsule 100 mg  100 mg Oral BID Regalado, Belkys A, MD   100 mg at 12/31/16 0949  . insulin aspart (novoLOG) injection 0-15 Units  0-15 Units Subcutaneous TID WC Regalado, Belkys A, MD   2 Units at 12/30/16 2140  . metoprolol tartrate (LOPRESSOR) injection 5 mg  5 mg Intravenous Q6H PRN Omar Person, NP      . nitroGLYCERIN (NITROSTAT) SL tablet 0.4 mg  0.4 mg Sublingual Q5 min PRN Reubin Milan, MD      . ondansetron [facility]) tablet 4 mg  4 mg Oral Q6H PRN Reubin Milan, MD       Or  . ondansetron [facility]) injection 4 mg  4 mg Intravenous Q6H PRN Reubin Milan, MD      . rOPINIRole (REQUIP) tablet 1 mg  1 mg Oral QHS Schorr, Rhetta Mura, NP   1 mg at 12/30/16 2140  . senna (SENOKOT) tablet 8.6 mg  1 tablet Oral BID Hammonds, Sharyn Blitz, MD   8.6 mg at 12/30/16 3474  . sodium chloride flush (NS) 0.9 % injection 10-40 mL  10-40 mL Intracatheter Q12H Oswald Hillock, MD   10 mL at 12/30/16 0827  . sodium chloride flush (NS) 0.9 % injection 10-40 mL  10-40 mL Intracatheter PRN Oswald Hillock, MD   10 mL at 12/30/16 2141     Discharge Medications: Please see discharge summary for a list of discharge medications.  Relevant Imaging Results:  Relevant Lab Results:   Additional Information SS# [id number] pt has BiBap (full face mask, 5L O2, set rate:12, Respiratory rate:19, [place], LCSW

## 2016-12-31 NOTE — Progress Notes (Signed)
Chart reviewed for LOS; B Melroy Bougher RN,MHA,BSN [phone] 

## 2016-12-31 NOTE — Progress Notes (Signed)
Clinical Social Worker facilitated patient discharge including contacting patient family and facility to confirm patient discharge plans.  Clinical information faxed to facility and family agreeable with plan.  CSW arranged ambulance transport via [facility] to [place] [facility] [place] .  RN Helene Kelp to call [phone] ask for accepting nurse at center for report prior to discharge.  Clinical Social Worker will sign off for now as social work intervention is no longer needed. Please consult [place] again if new need arises.  Rhea Pink, MSW, [place]

## 2017-01-02 ENCOUNTER — Encounter: Payer: Self-pay | Admitting: Gastroenterology

## 2017-01-11 NOTE — Progress Notes (Signed)
Cardiology Office Note   Date:  01/14/2017   ID:  Darrell Black, DOB 1956-02-01, MRN [id number]  PCP:  Darrell Renshaw, MD  Cardiologist:  Darrell Black   Chief Complaint  Patient presents with  . Coronary Artery Disease  . Cardiomyopathy  . Atrial Fibrillation  . Hypertension      History of Present Illness: Darrell Black is a 61 y.o. male ongoing assessment management of coronary artery disease, history of PCI and CABG in 2001, history of ischemic cardiomyopathy with LVEF of 61%, chronic systolic diastolic heart failure, St Jude ICD in situ, revised in 2014, persistent atrial fibrillation on anticoagulation with Eliquis, hypertension, with other history to include hyperlipidemia, depression, type 2 diabetes, morbid obesity, chronic kidney disease stage III, OSA.  Seeing the patient posthospitalization after admission for A. fib and CHF, with acute hypercarbic respiratory failure..  We  followed the patient on consultation throughout hospitalization.  The patient was diuresed 6.5 L and was continued on Lasix 40 mg p.o. daily.  Serum creatinine was decreased to 1.31 on discharge.  He was followed by nephrology.  Repeat echocardiogram revealed an EF of 35-40% with diffuse hypokinesis.  Weight on discharge 327 pounds.  He is currently a resident of [facility] skilled nursing facility.  The patient states that he drinks a lot of water while he is there.  And began to gain fluid weight.  The physician there increased his Lasix from 40 mg a day to 60 mg daily with an additional 20 mg for weight gain.  The patient's weight here in the office was 352 pounds.  He denies eating salty foods.  The patient also has obstructive sleep apnea, but is not been using his CPAP, as he states the staff does not set this up for him.  He complains of chronic shortness of breath and is oxygen dependent.  He complains of dry throat and mouth on O2 via nasal cannula.  He states this is why he drinks a lot of fluid.  He is very  sedentary.   Past Medical History:  Diagnosis Date  . Atherosclerosis   . Atrial fibrillation ([place])   . 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 Chest pain   . Chronic combined systolic and diastolic heart failure ([place])   . Chronic pain   . Coronary artery disease   . Diabetes mellitus without complication ([place])    type two  . Hyperlipidemia   . Hypertension    essential  . Ischemic cardiomyopathy   . Major depressive disorder, recurrent ([place])   . Morbid obesity due to excess calories ([place])   . Muscle weakness (generalized)   . Neuropathy   . Obstructive sleep apnea   . Presence of automatic cardioverter/defibrillator (AICD)   . Renal disorder    Chronic kidney disease  . Restless leg syndrome   . Sick-euthyroid syndrome     History reviewed. No pertinent surgical history.   Current Outpatient Medications  Medication Sig Dispense Refill  . acetaminophen (TYLENOL) 325 MG tablet Take 650 mg by mouth every 6 (six) hours as needed.    [name] amiodarone (PACERONE) 200 MG tablet Take 200 mg by mouth daily.    [name] apixaban (ELIQUIS) 5 MG TABS tablet Take 1 tablet (5 mg total) by mouth 2 (two) times daily. 60 tablet   . atorvastatin (LIPITOR) 80 MG tablet Take 80 mg by mouth every evening.    . carvedilol (COREG) 3.125 MG tablet Take 3.125 mg by mouth 2 (  two) times daily with a meal.    . docusate sodium (COLACE) 100 MG capsule Take 100 mg by mouth daily.    [name] gabapentin (NEURONTIN) 100 MG capsule Take 1 capsule (100 mg total) by mouth 2 (two) times daily.    . nitroGLYCERIN (NITROSTAT) 0.4 MG SL tablet Place 0.4 mg under the tongue every 5 (five) minutes as needed for chest pain.    [name] rOPINIRole (REQUIP) 1 MG tablet Take 1 mg by mouth at bedtime.    . senna (SENOKOT) 8.6 MG TABS tablet Take 1 tablet by mouth at bedtime.    [name] tiotropium (SPIRIVA) 18 MCG inhalation capsule Place 18 mcg into inhaler and inhale daily.     No current facility-administered  medications for this visit.     Allergies:   Ampicillin; Clindamycin/lincomycin; and Penicillins    Social History:  The patient  reports that  has never smoked. he has never used smokeless tobacco. He reports that he does not drink alcohol or use drugs.   Family History:  The patient's family history includes Alcohol abuse in his maternal grandfather; Cancer in his maternal grandmother; Hypertension in his father.    ROS: All other systems are reviewed and negative. Unless otherwise mentioned in H&P    PHYSICAL EXAM: VS:  BP 92/60   Pulse 78   Ht 5\' 8"  (1.727 m)   Wt (!) 352 lb (159.7 kg)   SpO2 94%   BMI 53.52 kg/m  , BMI Body mass index is 53.52 kg/m. GEN: Well nourished, well developed, in no acute distress morbidly obese HEENT: normal  Neck: no JVD, carotid bruits, or masses Cardiac: IRRR, distant heart sounds difficult to auscultate for murmurs;  rubs, or gallops, Respiratory:  clear to auscultation bilaterally, normal work of breathing, poor inspiratory effort unable to hear breath sounds in the bases. GI: Distended, nontender, + BS hypoactive MS: no deformity or atrophy massive edema bilateral lower extremities into the thighs.  Venous stasis skin changes are noted Skin: warm and dry, no rash Neuro:  Strength and sensation are intact Psych: euthymic mood, full affect.  Some memory difficulties...    Recent Labs: 12/11/2016: B Natriuretic Peptide 583.0 12/17/2016: Magnesium 1.6 12/19/2016: ALT 27 12/21/2016: TSH 11.211 12/30/2016: Hemoglobin 9.6; Platelets 275 12/31/2016: BUN 33; Creatinine, Ser 1.31; Potassium 4.2; Sodium 138     Wt Readings from Last 3 Encounters:  01/14/17 (!) 352 lb (159.7 kg)  12/31/16 (!) 327 lb 12.8 oz (148.7 kg)      Other studies Reviewed: Echocardiogram10/05/2016: - Procedure narrative: Contrast enhancement employed. - Left ventricle: The cavity size was normal. Wall thickness was increased in a pattern of mild LVH. Systolic  function was moderately reduced. The estimated ejection fraction was in the range of 35% to 40%. Diffuse hypokinesis. The study was not technically sufficient to allow evaluation of LV diastolic dysfunction due to atrial fibrillation. - Regional wall motion abnormality: Akinesis of the apical septal and apical myocardium; moderate hypokinesis of the basal-mid anteroseptal myocardium. - Aortic valve: Mildly calcified leaflets. - Mitral valve: Moderately calcified annulus. Normal thickness leaflets . - Left atrium: The atrium was mildly dilated. - Right ventricle: Pacer wire or catheter noted in right ventricle. Systolic function was mildly reduced. - Tricuspid valve: There was mild regurgitation. - Pulmonary arteries: PA peak pressure: 38 mm Hg (S). - Systemic veins: IVC is dilated wtih normal respiratory variation. Estimated CVP 8 mmHg.  ASSESSMENT AND PLAN:  1.  Acute on chronic systolic heart failure: Multifactorial.  Most recent ejection fraction 35% to 40% with weight on discharge on December 31, 2016 of 327 pounds.  Weight is up to 352 pounds despite increasing dose of Lasix from 40 mg p.o. daily to 60 mg p.o. daily.  He has massive fluid overload in the lower extremities up into the abdomen.  He admits to drinking a lot of fluids throughout the day due to a dry mouth.  He denies dietary noncompliance.  The patient will be taken to the emergency room where he should be admitted for IV diuresis.  It is likely that he will need several days of this.  Recommend 60 mg of Lasix IV twice daily or 3 times daily depending upon his response to medication.  Also recommend fluid restriction, 1500 cc a day.  Would likely need to be on torsemide for better bioavailability instead of furosemide on discharge.  Optimization of medications for discharge will need to be completed to avoid recurrent hospitalization within 30 days.  2.  Coronary artery disease: History of coronary artery  bypass grafting in 2001, remains on carvedilol 3.125 mg twice daily.  Is not a candidate for Entresto or spironolactone in the setting of chronic kidney disease.  3.  Atrial fibrillation: Heart rate is currently controlled.  He remains on apixaban milligrams twice daily, and amiodarone 200 mg daily.  No complaints of active bleeding.  CBC on admission  4.  Hypercholesterolemia: Continue atorvastatin 80 mg daily.  5.  Obstructive sleep apnea: Patient will need CPAP overnight.  This will also need to be instituted during skilled nursing facility stay.  States he is not wearing it at night because no one sets it up for him.  6.  St Jude pacemaker in situ: Recommend pacemaker interrogation to evaluate atrial fib burden which may be precipitating fluid retention as well.  7.  Diabetes type II: Do not see that he is on oral medications or insulin.  This will need to be assessed during hospitalization.  8.  Chronic kidney disease stage III: Baseline creatinine 1.3-1.4.  9.  Morbid obesity:   Current medicines are reviewed at length with the patient today.    Labs/ tests ordered today include:   Phill Myron. West Pugh, ANP, AACC   01/14/2017 1:04 PM    [facility] [address] [place], [place] [address] Phone: [phone]; Fax: [phone]

## 2017-01-14 ENCOUNTER — Emergency Department (HOSPITAL_COMMUNITY): Payer: Medicaid Other

## 2017-01-14 ENCOUNTER — Inpatient Hospital Stay (HOSPITAL_COMMUNITY)
Admission: EM | Admit: 2017-01-14 | Discharge: 2017-01-23 | DRG: 291 | Disposition: A | Discharge status: Skilled Nursing Facility | Payer: Medicaid Other | Attending: Internal Medicine | Admitting: Internal Medicine

## 2017-01-14 ENCOUNTER — Encounter: Payer: Self-pay | Admitting: Adult Health

## 2017-01-14 ENCOUNTER — Ambulatory Visit (INDEPENDENT_AMBULATORY_CARE_PROVIDER_SITE_OTHER): Payer: Medicaid Other | Admitting: Adult Health

## 2017-01-14 ENCOUNTER — Encounter (HOSPITAL_COMMUNITY): Payer: Self-pay | Admitting: Emergency Medicine

## 2017-01-14 VITALS — BP 92/60 | HR 78 | Ht 68.0 in | Wt 352.0 lb

## 2017-01-14 DIAGNOSIS — I5042 Chronic combined systolic (congestive) and diastolic (congestive) heart failure: Secondary | ICD-10-CM | POA: Diagnosis not present

## 2017-01-14 DIAGNOSIS — J9622 Acute and chronic respiratory failure with hypercapnia: Secondary | ICD-10-CM | POA: Diagnosis present

## 2017-01-14 DIAGNOSIS — I482 Chronic atrial fibrillation: Secondary | ICD-10-CM | POA: Diagnosis not present

## 2017-01-14 DIAGNOSIS — I4821 Permanent atrial fibrillation: Secondary | ICD-10-CM

## 2017-01-14 DIAGNOSIS — E1122 Type 2 diabetes mellitus with diabetic chronic kidney disease: Secondary | ICD-10-CM | POA: Diagnosis present

## 2017-01-14 DIAGNOSIS — D631 Anemia in chronic kidney disease: Secondary | ICD-10-CM | POA: Diagnosis present

## 2017-01-14 DIAGNOSIS — I5023 Acute on chronic systolic (congestive) heart failure: Secondary | ICD-10-CM | POA: Diagnosis not present

## 2017-01-14 DIAGNOSIS — N183 Chronic kidney disease, stage 3 unspecified: Secondary | ICD-10-CM | POA: Diagnosis present

## 2017-01-14 DIAGNOSIS — I2510 Atherosclerotic heart disease of native coronary artery without angina pectoris: Secondary | ICD-10-CM | POA: Diagnosis present

## 2017-01-14 DIAGNOSIS — I509 Heart failure, unspecified: Secondary | ICD-10-CM

## 2017-01-14 DIAGNOSIS — Z6841 Body Mass Index (BMI) 40.0 and over, adult: Secondary | ICD-10-CM

## 2017-01-14 DIAGNOSIS — E662 Morbid (severe) obesity with alveolar hypoventilation: Secondary | ICD-10-CM | POA: Diagnosis present

## 2017-01-14 DIAGNOSIS — E785 Hyperlipidemia, unspecified: Secondary | ICD-10-CM | POA: Diagnosis not present

## 2017-01-14 DIAGNOSIS — G2581 Restless legs syndrome: Secondary | ICD-10-CM | POA: Diagnosis present

## 2017-01-14 DIAGNOSIS — E8770 Fluid overload, unspecified: Secondary | ICD-10-CM | POA: Diagnosis present

## 2017-01-14 DIAGNOSIS — I5020 Unspecified systolic (congestive) heart failure: Secondary | ICD-10-CM | POA: Diagnosis present

## 2017-01-14 DIAGNOSIS — I130 Hypertensive heart and chronic kidney disease with heart failure and stage 1 through stage 4 chronic kidney disease, or unspecified chronic kidney disease: Secondary | ICD-10-CM | POA: Diagnosis present

## 2017-01-14 DIAGNOSIS — I493 Ventricular premature depolarization: Secondary | ICD-10-CM | POA: Diagnosis not present

## 2017-01-14 DIAGNOSIS — E1140 Type 2 diabetes mellitus with diabetic neuropathy, unspecified: Secondary | ICD-10-CM | POA: Diagnosis present

## 2017-01-14 DIAGNOSIS — E872 Acidosis: Secondary | ICD-10-CM | POA: Diagnosis present

## 2017-01-14 DIAGNOSIS — J449 Chronic obstructive pulmonary disease, unspecified: Secondary | ICD-10-CM | POA: Diagnosis present

## 2017-01-14 DIAGNOSIS — Z8249 Family history of ischemic heart disease and other diseases of the circulatory system: Secondary | ICD-10-CM

## 2017-01-14 DIAGNOSIS — I480 Paroxysmal atrial fibrillation: Secondary | ICD-10-CM | POA: Diagnosis not present

## 2017-01-14 DIAGNOSIS — F339 Major depressive disorder, recurrent, unspecified: Secondary | ICD-10-CM | POA: Diagnosis present

## 2017-01-14 DIAGNOSIS — Z955 Presence of coronary angioplasty implant and graft: Secondary | ICD-10-CM

## 2017-01-14 DIAGNOSIS — Z95810 Presence of automatic (implantable) cardiac defibrillator: Secondary | ICD-10-CM | POA: Diagnosis not present

## 2017-01-14 DIAGNOSIS — Z951 Presence of aortocoronary bypass graft: Secondary | ICD-10-CM

## 2017-01-14 DIAGNOSIS — Z79899 Other long term (current) drug therapy: Secondary | ICD-10-CM

## 2017-01-14 DIAGNOSIS — Z881 Allergy status to other antibiotic agents status: Secondary | ICD-10-CM

## 2017-01-14 DIAGNOSIS — I43 Cardiomyopathy in diseases classified elsewhere: Secondary | ICD-10-CM

## 2017-01-14 DIAGNOSIS — I255 Ischemic cardiomyopathy: Secondary | ICD-10-CM | POA: Diagnosis not present

## 2017-01-14 DIAGNOSIS — E876 Hypokalemia: Secondary | ICD-10-CM | POA: Diagnosis present

## 2017-01-14 DIAGNOSIS — Z7901 Long term (current) use of anticoagulants: Secondary | ICD-10-CM

## 2017-01-14 DIAGNOSIS — I5043 Acute on chronic combined systolic (congestive) and diastolic (congestive) heart failure: Secondary | ICD-10-CM | POA: Diagnosis present

## 2017-01-14 DIAGNOSIS — Z9981 Dependence on supplemental oxygen: Secondary | ICD-10-CM

## 2017-01-14 DIAGNOSIS — Z880 Allergy status to penicillin: Secondary | ICD-10-CM

## 2017-01-14 DIAGNOSIS — E8779 Other fluid overload: Secondary | ICD-10-CM

## 2017-01-14 DIAGNOSIS — G8929 Other chronic pain: Secondary | ICD-10-CM | POA: Diagnosis present

## 2017-01-14 DIAGNOSIS — J9621 Acute and chronic respiratory failure with hypoxia: Secondary | ICD-10-CM | POA: Diagnosis present

## 2017-01-14 DIAGNOSIS — Z9119 Patient's noncompliance with other medical treatment and regimen: Secondary | ICD-10-CM

## 2017-01-14 DIAGNOSIS — N179 Acute kidney failure, unspecified: Secondary | ICD-10-CM | POA: Diagnosis present

## 2017-01-14 DIAGNOSIS — I4891 Unspecified atrial fibrillation: Secondary | ICD-10-CM | POA: Diagnosis present

## 2017-01-14 DIAGNOSIS — I878 Other specified disorders of veins: Secondary | ICD-10-CM | POA: Diagnosis present

## 2017-01-14 LAB — COMPREHENSIVE METABOLIC PANEL
ALT: 18 U/L (ref 17–63)
ANION GAP: 10 (ref 5–15)
AST: 20 U/L (ref 15–41)
Albumin: 3.6 g/dL (ref 3.5–5.0)
Alkaline Phosphatase: 121 U/L (ref 38–126)
BILIRUBIN TOTAL: 1.2 mg/dL (ref 0.3–1.2)
BUN: 50 mg/dL — ABNORMAL HIGH (ref 6–20)
CO2: 35 mmol/L — ABNORMAL HIGH (ref 22–32)
Calcium: 8.6 mg/dL — ABNORMAL LOW (ref 8.9–10.3)
Chloride: 94 mmol/L — ABNORMAL LOW (ref 101–111)
Creatinine, Ser: 1.52 mg/dL — ABNORMAL HIGH (ref 0.61–1.24)
GFR, EST AFRICAN AMERICAN: 55 mL/min — AB (ref >60)
GFR, EST NON AFRICAN AMERICAN: 48 mL/min — AB (ref >60)
Glucose, Bld: 117 mg/dL — ABNORMAL HIGH (ref 65–99)
POTASSIUM: 4.8 mmol/L (ref 3.5–5.1)
Sodium: 139 mmol/L (ref 135–145)
TOTAL PROTEIN: 6.8 g/dL (ref 6.5–8.1)

## 2017-01-14 LAB — CBC
HCT: 34.9 % — ABNORMAL LOW (ref 39.0–52.0)
HEMOGLOBIN: 10.4 g/dL — AB (ref 13.0–17.0)
MCH: 30 pg (ref 26.0–34.0)
MCHC: 29.8 g/dL — AB (ref 30.0–36.0)
MCV: 100.6 fL — ABNORMAL HIGH (ref 78.0–100.0)
Platelets: 137 10*3/uL — ABNORMAL LOW (ref 150–400)
RBC: 3.47 MIL/uL — AB (ref 4.22–5.81)
RDW: 16.5 % — ABNORMAL HIGH (ref 11.5–15.5)
WBC: 5.4 10*3/uL (ref 4.0–10.5)

## 2017-01-14 LAB — BRAIN NATRIURETIC PEPTIDE: B Natriuretic Peptide: 687 pg/mL — ABNORMAL HIGH (ref 0.0–100.0)

## 2017-01-14 LAB — TROPONIN I

## 2017-01-14 MED ORDER — ONDANSETRON HCL 4 MG PO TABS
4.0000 mg | ORAL_TABLET | Freq: Four times a day (QID) | ORAL | Status: DC | PRN
  Administered 2017-01-15 – 2017-01-16 (×2): 4 mg via ORAL
  Filled 2017-01-14 (×2): qty 1

## 2017-01-14 MED ORDER — ROPINIROLE HCL 1 MG PO TABS
1.0000 mg | ORAL_TABLET | Freq: Every day | ORAL | Status: DC
  Administered 2017-01-15: 1 mg via ORAL
  Filled 2017-01-14 (×3): qty 1

## 2017-01-14 MED ORDER — GABAPENTIN 100 MG PO CAPS
100.0000 mg | ORAL_CAPSULE | Freq: Two times a day (BID) | ORAL | Status: DC
  Administered 2017-01-15 – 2017-01-23 (×18): 100 mg via ORAL
  Filled 2017-01-14 (×18): qty 1

## 2017-01-14 MED ORDER — DOCUSATE SODIUM 100 MG PO CAPS
100.0000 mg | ORAL_CAPSULE | Freq: Every day | ORAL | Status: DC
  Administered 2017-01-15 – 2017-01-21 (×7): 100 mg via ORAL
  Filled 2017-01-14 (×7): qty 1

## 2017-01-14 MED ORDER — SODIUM CHLORIDE 0.9 % IV SOLN: 250.0000 mL | INTRAVENOUS | Status: DC | PRN

## 2017-01-14 MED ORDER — ACETAMINOPHEN 325 MG PO TABS
650.0000 mg | ORAL_TABLET | Freq: Four times a day (QID) | ORAL | Status: DC | PRN
  Administered 2017-01-15: 650 mg via ORAL
  Filled 2017-01-14: qty 2

## 2017-01-14 MED ORDER — CARVEDILOL 3.125 MG PO TABS
3.1250 mg | ORAL_TABLET | Freq: Two times a day (BID) | ORAL | Status: DC
  Administered 2017-01-15 – 2017-01-23 (×14): 3.125 mg via ORAL
  Filled 2017-01-14 (×17): qty 1

## 2017-01-14 MED ORDER — SODIUM CHLORIDE 0.9% FLUSH: 3.0000 mL | INTRAVENOUS | Status: DC | PRN

## 2017-01-14 MED ORDER — SENNA 8.6 MG PO TABS
1.0000 | ORAL_TABLET | Freq: Every day | ORAL | Status: DC
  Administered 2017-01-15 – 2017-01-20 (×7): 8.6 mg via ORAL
  Filled 2017-01-14 (×7): qty 1

## 2017-01-14 MED ORDER — BISACODYL 10 MG RE SUPP
10.0000 mg | Freq: Every day | RECTAL | Status: DC | PRN
  Administered 2017-01-21: 10 mg via RECTAL
  Filled 2017-01-14: qty 1

## 2017-01-14 MED ORDER — APIXABAN 5 MG PO TABS
5.0000 mg | ORAL_TABLET | Freq: Two times a day (BID) | ORAL | Status: DC
  Administered 2017-01-15 – 2017-01-23 (×18): 5 mg via ORAL
  Filled 2017-01-14 (×18): qty 1

## 2017-01-14 MED ORDER — SODIUM CHLORIDE 0.9% FLUSH
3.0000 mL | Freq: Two times a day (BID) | INTRAVENOUS | Status: DC
  Administered 2017-01-15 – 2017-01-23 (×16): 3 mL via INTRAVENOUS

## 2017-01-14 MED ORDER — ATORVASTATIN CALCIUM 40 MG PO TABS
80.0000 mg | ORAL_TABLET | Freq: Every evening | ORAL | Status: DC
  Administered 2017-01-15 – 2017-01-22 (×7): 80 mg via ORAL
  Filled 2017-01-14 (×8): qty 2

## 2017-01-14 MED ORDER — ACETAMINOPHEN 650 MG RE SUPP: 650.0000 mg | Freq: Four times a day (QID) | RECTAL | Status: DC | PRN

## 2017-01-14 MED ORDER — ONDANSETRON HCL 4 MG/2ML IJ SOLN: 4.0000 mg | Freq: Four times a day (QID) | INTRAMUSCULAR | Status: DC | PRN

## 2017-01-14 MED ORDER — FUROSEMIDE 10 MG/ML IJ SOLN
40.0000 mg | Freq: Once | INTRAMUSCULAR | Status: AC
Start: 2017-01-14 — End: 2017-01-14
  Administered 2017-01-14: 40 mg via INTRAVENOUS
  Filled 2017-01-14: qty 4

## 2017-01-14 MED ORDER — AMIODARONE HCL 200 MG PO TABS
200.0000 mg | ORAL_TABLET | Freq: Every day | ORAL | Status: DC
Start: 2017-01-15 — End: 2017-01-23
  Administered 2017-01-15 – 2017-01-23 (×9): 200 mg via ORAL
  Filled 2017-01-14 (×9): qty 1

## 2017-01-14 MED ORDER — FUROSEMIDE 10 MG/ML IJ SOLN
80.0000 mg | Freq: Three times a day (TID) | INTRAMUSCULAR | Status: DC
  Administered 2017-01-14 – 2017-01-15 (×2): 80 mg via INTRAVENOUS
  Filled 2017-01-14 (×2): qty 8

## 2017-01-14 MED ORDER — TIOTROPIUM BROMIDE MONOHYDRATE 18 MCG IN CAPS
18.0000 ug | ORAL_CAPSULE | Freq: Every day | RESPIRATORY_TRACT | Status: DC
  Administered 2017-01-15 – 2017-01-23 (×8): 18 ug via RESPIRATORY_TRACT
  Filled 2017-01-14 (×2): qty 5

## 2017-01-14 NOTE — ED Triage Notes (Signed)
PT was seen at [facility] heart care for f/u this am and was complaining of SOB on exertion worsening over the past week and was told to come to the ED for eval. PT c/o increased bilateral lower edema.

## 2017-01-14 NOTE — ED Notes (Signed)
[employer] employee stated she would bring patient's ipad and Games developer

## 2017-01-14 NOTE — ED Provider Notes (Signed)
[facility] EMERGENCY DEPARTMENT Provider Note   CSN: [id number] Arrival date & time: 01/14/17  1334     History   Chief Complaint Chief Complaint  Patient presents with  . Shortness of Breath    HPI Darrell Black is a 61 y.o. male.  Patient complains of additional swelling.  Patient states that he has put on over 20 pounds.  Patient also has some shortness of breath.  He has a history of congestive heart failure   The history is provided by the patient.  Shortness of Breath  This is a new problem. The problem occurs frequently.The current episode started less than 1 hour ago. The problem has not changed since onset.Pertinent negatives include no fever, no headaches, no cough, no chest pain, no abdominal pain and no rash. It is unknown what precipitated the problem. He has tried nothing for the symptoms. The treatment provided no relief. He has had prior hospitalizations. He has had prior ED visits. He has had prior ICU admissions. Associated medical issues do not include PE.    Past Medical History:  Diagnosis Date  . Atherosclerosis   . Atrial fibrillation ([place])   . 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 Chest pain   . Chronic combined systolic and diastolic heart failure ([place])   . Chronic pain   . Coronary artery disease   . 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 Hyperlipidemia   . Hypertension    essential  . Ischemic cardiomyopathy   . Major depressive disorder, recurrent ([place])   . Morbid obesity due to excess calories ([place])   . Muscle weakness (generalized)   . Neuropathy   . Obstructive sleep apnea   . Presence of automatic cardioverter/defibrillator (AICD)   . Renal disorder    Chronic kidney disease  . Restless leg syndrome   . Sick-euthyroid syndrome     Patient Active Problem List   Diagnosis Date Noted  . Polyp of sigmoid colon   . Morbid obesity due to excess calories ([place]) 12/18/2016  . Hypernatremia  12/18/2016  . Rectal bleeding   . Acute on chronic respiratory failure with hypoxia and hypercapnia (HCC)   . Visual hallucination   . Acute systolic congestive heart failure ([place])   . Acute on chronic systolic congestive heart failure ([place])   . 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 Acute respiratory insufficiency 12/12/2016  . Obstructive sleep apnea 12/12/2016  . Acute respiratory failure with hypoxia and hypercarbia ([place]) 12/12/2016  . Acute psychosis ([place]) 12/12/2016  . Atrial fibrillation ([place]) 12/12/2016  . Oral bleeding 12/12/2016  . Coronary artery disease 12/12/2016  . Restless leg syndrome 12/12/2016  . Hypertension 12/12/2016  . Hyperlipidemia 12/12/2016  . Chronic combined systolic and diastolic heart failure ([place]) 12/12/2016  . Hyperammonemia ([place]) 12/12/2016    History reviewed. No pertinent surgical history.     Home Medications    Prior to Admission medications   Medication Sig Start Date End Date Taking? Authorizing Provider  acetaminophen (TYLENOL) 325 MG tablet Take 650 mg by mouth every 6 (six) hours as needed.    [provider]  amiodarone (PACERONE) 200 MG tablet Take 200 mg by mouth daily.    [provider]  apixaban (ELIQUIS) 5 MG TABS tablet Take 1 tablet (5 mg total) by mouth 2 (two) times daily. 12/31/16   Isaac Bliss, Rayford Halsted, MD  atorvastatin (LIPITOR) 80 MG tablet Take 80 mg by mouth every evening.    [provider]  carvedilol (COREG) 3.125 MG tablet Take 3.125 mg by mouth 2 (two) times daily with a meal.    [provider]  docusate sodium (COLACE) 100 MG capsule Take 100 mg by mouth daily.    [provider]  furosemide (LASIX) 20 MG tablet Take 20 mg daily as needed by mouth. Take every 24 hrs for edema    [provider]  furosemide (LASIX) 40 MG tablet Take 40 mg daily by mouth.    [provider]    gabapentin (NEURONTIN) 100 MG capsule Take 1 capsule (100 mg total) by mouth 2 (two) times daily. 12/31/16   Isaac Bliss, Rayford Halsted, MD  nitroGLYCERIN (NITROSTAT) 0.4 MG SL tablet Place 0.4 mg under the tongue every 5 (five) minutes as needed for chest pain.    [provider]  rOPINIRole (REQUIP) 1 MG tablet Take 1 mg by mouth at bedtime.    [provider]  senna (SENOKOT) 8.6 MG TABS tablet Take 1 tablet by mouth at bedtime.    [provider]  tiotropium (SPIRIVA) 18 MCG inhalation capsule Place 18 mcg into inhaler and inhale daily.    [provider]    Family History Family History  Problem Relation Age of Onset  . Hypertension Father   . Cancer Maternal Grandmother   . Alcohol abuse Maternal Grandfather     Social History Social History   Tobacco Use  . Smoking status: Never Smoker  . Smokeless tobacco: Never Used  Substance Use Topics  . Alcohol use: No  . Drug use: No     Allergies   Ampicillin; Clindamycin/lincomycin; and Penicillins   Review of Systems Review of Systems  Constitutional: Negative for appetite change, fatigue and fever.  HENT: Negative for congestion, ear discharge and sinus pressure.   Eyes: Negative for discharge.  Respiratory: Positive for shortness of breath. Negative for cough.   Cardiovascular: Negative for chest pain.  Gastrointestinal: Negative for abdominal pain and diarrhea.  Genitourinary: Negative for frequency and hematuria.  Musculoskeletal: Negative for back pain.  Skin: Negative for rash.  Neurological: Negative for seizures and headaches.  Psychiatric/Behavioral: Negative for hallucinations.     Physical Exam Updated Vital Signs BP 97/63 (BP Location: Right Arm)   Pulse 70   Temp 97.7 F (36.5 C) (Oral)   Resp 20   Ht 5\' 8"  (1.727 m)   Wt (!) 159.7 kg (352 lb)   SpO2 98%   BMI 53.52 kg/m   Physical Exam  Constitutional: He is oriented to person, place, and time. He appears  well-developed.  HENT:  Head: Normocephalic.  Eyes: Conjunctivae and EOM are normal. No scleral icterus.  Neck: Neck supple. No thyromegaly present.  Cardiovascular: Normal rate and regular rhythm. Exam reveals no gallop and no friction rub.  No murmur heard. Pulmonary/Chest: No stridor. He has no wheezes. He has no rales. He exhibits no tenderness.  Significant edema in his lower abdomen  Abdominal: He exhibits no distension. There is no tenderness. There is no rebound.  Musculoskeletal: Normal range of motion. He exhibits no edema.  3+ edema all the way up his legs bilaterally  Lymphadenopathy:    He has no cervical adenopathy.  Neurological: He is oriented to person, place, and time. He exhibits normal muscle tone. Coordination normal.  Skin: No rash noted. No erythema.  Psychiatric: He has a normal mood and  affect. His behavior is normal.     ED Treatments / Results  Labs (all labs ordered are listed, but only abnormal results are displayed) Labs Reviewed  CBC - Abnormal; Notable for the following components:      Result Value   RBC 3.47 (*)    Hemoglobin 10.4 (*)    HCT 34.9 (*)    MCV 100.6 (*)    MCHC 29.8 (*)    RDW 16.5 (*)    Platelets 137 (*)    All other components within normal limits  BRAIN NATRIURETIC PEPTIDE - Abnormal; Notable for the following components:   B Natriuretic Peptide 687.0 (*)    All other components within normal limits  COMPREHENSIVE METABOLIC PANEL - Abnormal; Notable for the following components:   Chloride 94 (*)    CO2 35 (*)    Glucose, Bld 117 (*)    BUN 50 (*)    Creatinine, Ser 1.52 (*)    Calcium 8.6 (*)    GFR calc non Af Amer 48 (*)    GFR calc Af Amer 55 (*)    All other components within normal limits  TROPONIN I    EKG  EKG Interpretation  Date/Time:  Monday January 14 2017 13:54:39 EST Ventricular Rate:  70 PR Interval:  98 QRS Duration: 150 QT Interval:  486 QTC Calculation: 524 R Axis:   -57 Text  Interpretation:  Atrial-paced rhythm Left axis deviation Non-specific intra-ventricular conduction block Lateral infarct , age undetermined Inferior infarct , age undetermined Abnormal ECG Confirmed by Milton Ferguson [phone]) on 01/14/2017 6:07:04 PM       Radiology Dg Chest 2 View  Result Date: 01/14/2017 CLINICAL DATA:  Shortness of Breath EXAM: CHEST  2 VIEW COMPARISON:  December 24, 2016 FINDINGS: There are small pleural effusions bilaterally. There is patchy atelectasis in the mid lower lung zones bilaterally. There is trace interstitial edema. There is cardiomegaly with pulmonary venous hypertension. Pacemaker leads are attached to the right atrium and right ventricle. Patient is status post median sternotomy. No adenopathy. There is degenerative change in the lower thoracic spine. IMPRESSION: Cardiomegaly with pulmonary venous hypertension consistent with a degree of pulmonary vascular congestion. Small pleural effusions and trace bibasilar edema noted. There is patchy atelectasis in both mid and lower lung zones. Pacemaker leads attached to right atrium and right ventricle. Electronically Signed   By: Lowella Grip III M.D.   On: 01/14/2017 14:18    Procedures Procedures (including critical care time)  Medications Ordered in ED Medications  furosemide (LASIX) injection 40 mg (not administered)     Initial Impression / Assessment and Plan / ED Course  I have reviewed the triage vital signs and the nursing notes.  Pertinent labs & imaging results that were available during my care of the patient were reviewed by me and considered in my medical decision making (see chart for details).     Patient with anasarca.  Patient will be admitted by medicine for diuresis  Final Clinical Impressions(s) / ED Diagnoses   Final diagnoses:  Systolic congestive heart failure, unspecified HF chronicity [facility])    ED Discharge Orders    None       Milton Ferguson, MD 01/14/17 [phone]

## 2017-01-14 NOTE — H&P (Signed)
[facility] History and Physical  Darrell Black PXT:[id number] DOB: Dec 12, 1955 DOA: 01/14/2017  Referring physician: Dr Roderic Palau PCP: Caprice Renshaw, MD   Chief Complaint: SOB/ DOE  HPI: Darrell Black is a 61 y.o. male with hx of afib, CAD, hx CABG/ subsequent stents, combined s/d CHF, HL, DM, HTN, ICM, depression, morbid obesity, OSA , AICD in place and CKD went for his cardiology f/u today and they sent him to ED for severe LE edema w/ dyspnea for evaluation. Exam showed anasarca and CXR showed vasc congestion, small bilat effusions and bibasilar possilbe edema with low lung volume.  BP's are soft, 100% SpO2 on 3L [place].  RR 18, not in distress. We are asked to see for admission.    Patient falling asleep during interview.  Reports increasing LE edema and SOB w orthopnea over the last 2-4 wks.  Says he was supposed to switch over from lasix to bumex pills this week, because "they work better for me".  Has had several episodes of this before per pt.  Had CABG in 2001 and then stents placed in 2006 and then again in 2017.  ICD ST Jude placed in 2006 with generator in 2014.   Has afib . On home oxygen recently started. He was seen by cardiology during last admission and they noted he was in afib, not certain though is chornic/ parox/ persistent.  It chronic, amio could be d'c and HR controlled with coreg or toprol-XL.   He lived in [place], then in [place] , [place], then moved to [place] to live with a niece earlier this year.      Per cardiology notes today the patient is a resident at [facility] now.  Has been drinking lots of water there and he began to gain fluid weight.  They increased his lasix from 40 mg to 60 mg daily .  He is oxygen dependent.   He said is he trying to lose weight and was down to 325 or so, but now is back up to 350 due to fluid overload.  At maximum he was over 400 lbs.    Patient is single.   Only admit in Epic is from Oct 1 - 22, 2018 when he presented with visual hallucinations to the  ED, an ongoing issue since he started a new antidepressant 4-5 mos earlier.  In ED awiaiting transfer to psych facility, he decompensated w/ SOB, cough and wheezing.  PCO2 on ABG was 84.  CXR showed vasc congestion. Diagnosis was acute hypoxia/ hypercarb resp failure, multifactorial due to OSA/ OHS, acute systolic CHF.  rx'd with bipap prn.  Diuresed 6.5 L w/ lasix IV.  Acute on CKD, peak creat 3.0, dc 1.5.  Chronic afib on eliquis, amio, coreg.  DM2   ROS  denies CP  no joint pain   no HA  no blurry vision  no rash  no diarrhea  no nausea/ vomiting  no dysuria  no difficulty voiding  no change in urine color    Past Medical History  Past Medical History:  Diagnosis Date  . Atherosclerosis   . Atrial fibrillation ([place])   . 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 Chest pain   . Chronic combined systolic and diastolic heart failure ([place])   . Chronic pain   . Coronary artery disease   . 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 Hyperlipidemia   . Hypertension    essential  . Ischemic  cardiomyopathy   . Major depressive disorder, recurrent ([place])   . Morbid obesity due to excess calories ([place])   . Muscle weakness (generalized)   . Neuropathy   . Obstructive sleep apnea   . Presence of automatic cardioverter/defibrillator (AICD)   . Renal disorder    Chronic kidney disease  . Restless leg syndrome   . Sick-euthyroid syndrome    Past Surgical History History reviewed. No pertinent surgical history. Family History  Family History  Problem Relation Age of Onset  . Hypertension Father   . Cancer Maternal Grandmother   . Alcohol abuse Maternal Grandfather    Social History  reports that  has never smoked. he has never used smokeless tobacco. He reports that he does not drink alcohol or use drugs. Allergies  Allergies  Allergen Reactions  . Ampicillin Rash  . Clindamycin/Lincomycin Rash  . Penicillins Rash    Has patient had a  PCN reaction causing immediate rash, facial/tongue/throat swelling, SOB or lightheadedness with hypotension: Yes Has patient had a PCN reaction causing severe rash involving mucus membranes or skin necrosis: No Has patient had a PCN reaction that required hospitalization: No Has patient had a PCN reaction occurring within the last 10 years: No If all of the above answers are "NO", then may proceed with Cephalosporin use.    Home medications Prior to Admission medications   Medication Sig Start Date End Date Taking? Authorizing Provider  acetaminophen (TYLENOL) 325 MG tablet Take 650 mg by mouth every 6 (six) hours as needed.    [provider]  amiodarone (PACERONE) 200 MG tablet Take 200 mg by mouth daily.    [provider]  apixaban (ELIQUIS) 5 MG TABS tablet Take 1 tablet (5 mg total) by mouth 2 (two) times daily. 12/31/16   Isaac Bliss, Rayford Halsted, MD  atorvastatin (LIPITOR) 80 MG tablet Take 80 mg by mouth every evening.    [provider]  carvedilol (COREG) 3.125 MG tablet Take 3.125 mg by mouth 2 (two) times daily with a meal.    [provider]  docusate sodium (COLACE) 100 MG capsule Take 100 mg by mouth daily.    [provider]  furosemide (LASIX) 20 MG tablet Take 20 mg daily as needed by mouth. Take every 24 hrs for edema    [provider]  furosemide (LASIX) 40 MG tablet Take 40 mg daily by mouth.    [provider]  gabapentin (NEURONTIN) 100 MG capsule Take 1 capsule (100 mg total) by mouth 2 (two) times daily. 12/31/16   Isaac Bliss, Rayford Halsted, MD  nitroGLYCERIN (NITROSTAT) 0.4 MG SL tablet Place 0.4 mg under the tongue every 5 (five) minutes as needed for chest pain.    [provider]  rOPINIRole (REQUIP) 1 MG tablet Take 1 mg by mouth at bedtime.    [provider]  senna (SENOKOT) 8.6 MG TABS tablet Take 1 tablet by mouth at bedtime.    [provider]  tiotropium (SPIRIVA)  18 MCG inhalation capsule Place 18 mcg into inhaler and inhale daily.    [provider]   Liver Function Tests Recent Labs  Lab 01/14/17 1436  AST 20  ALT 18  ALKPHOS 121  BILITOT 1.2  PROT 6.8  ALBUMIN 3.6   No results for input(s): LIPASE, AMYLASE in the last 168 hours. CBC Recent Labs  Lab 01/14/17 1436  WBC 5.4  HGB 10.4*  HCT 34.9*  MCV 100.6*  PLT 137*  Basic Metabolic Panel Recent Labs  Lab 01/14/17 1436  NA 139  K 4.8  CL 94*  CO2 35*  GLUCOSE 117*  BUN 50*  CREATININE 1.52*  CALCIUM 8.6*     Vitals:   01/14/17 1347 01/14/17 1348 01/14/17 1718  BP: 94/62  97/63  Pulse: 70  70  Resp: 20    Temp: 97.6 F (36.4 C)  97.7 F (36.5 C)  TempSrc: Oral  Oral  SpO2: 100%  98%  Weight:  (!) 159.7 kg (352 lb)   Height:  _0  (1.727 m)    Exam: Gen markedly obese, malodorous No rash, cyanosis or gangrene Sclera anicteric, throat clear  No jvd or bruits Chest bilat coarse rales at bases, no wheezing RRR no MRG Abd soft ntnd no mass or ascites +bs morbidly obese +2 abd wall edema  GU normal male MS no joint effusions or deformity Ext 3+ diffuse below the knee bilat edema / +dark erythematous lower legs w/o ulcers or gangrene Neuro is somnolent but arousable and O x3, NF    Home meds: -amiodarone 200 qd/ coreg 3.125 bid/ eliquis 33m bid/ lasix 20 -40 mg qd prn/ SL NTG prn -spiriva 18 ug qd -senna 1 tab hs/ requip 1 mg hs/ neurontin 100 bid/ colace 100qd/ tylenol prn   Na 139  K 4.8  CO2 35 BUN 50  Cr 1.52  Ca 8.6   Alb 3.6  LFT's ok  eGFR 50 BNP 687  Trop < 0.03    WBC 5k  Hb 10.4  plt 137 INR 1.6  Glu 117     Baseline Creat = 1.4 - 2.0 from Oct 2018 admit.   EKG (independ reviewed) > Left axis deviation. Non-specific intra-ventricular conduction block.  Lateral infarct , age undetermined.  Inferior infarct , age undetermined  CXR (independ reviewed) > Cardiomegaly with pulmonary venous hypertension consistent with a degree of  pulmonary vascular congestion. Small pleural effusions and trace bibasilar edema noted. There is patchy atelectasis in both mid and lower lung zones. Pacemaker leads attached to right atrium and right ventricle.   Assessment: 1. Acute on chron resp failure - pt w/ known hx hypercarbia due to OHS/ OSA.  Here with decompensated CHF with fluid overload/ anasarca of LE's, abd wall. Plan admit, IV lasix, O2 support.   2. Morbid obesity 3. Chron resp failure - due to OHS/ OSA/ CHF 4. Vol overload - up 30-40 lbs per pt 5. Afib - f/b cardiology, rate controlled, on coreg / amio, eliquis; continue all 6. Hx ICD placement 7. ICM / hx CABG/ EF 35-40% 8. COPD - on spiriva, cont   Plan - admit, diurese, bipap prn, wants to be full code.         SDELDRICK, LINCHD [facility] Pager [phone]  If 7PM-7AM, please contact night-coverage [email or web address] Password [facility]11/07/2016, 6:19 PM

## 2017-01-15 ENCOUNTER — Other Ambulatory Visit: Payer: Self-pay

## 2017-01-15 DIAGNOSIS — E785 Hyperlipidemia, unspecified: Secondary | ICD-10-CM

## 2017-01-15 LAB — BASIC METABOLIC PANEL
ANION GAP: 7 (ref 5–15)
BUN: 51 mg/dL — AB (ref 6–20)
CHLORIDE: 93 mmol/L — AB (ref 101–111)
CO2: 40 mmol/L — ABNORMAL HIGH (ref 22–32)
Calcium: 8.7 mg/dL — ABNORMAL LOW (ref 8.9–10.3)
Creatinine, Ser: 1.39 mg/dL — ABNORMAL HIGH (ref 0.61–1.24)
GFR calc Af Amer: >60 mL/min (ref >60)
GFR calc non Af Amer: 53 mL/min — ABNORMAL LOW (ref >60)
Glucose, Bld: 121 mg/dL — ABNORMAL HIGH (ref 65–99)
POTASSIUM: 4.7 mmol/L (ref 3.5–5.1)
SODIUM: 140 mmol/L (ref 135–145)

## 2017-01-15 LAB — MRSA PCR SCREENING: MRSA by PCR: NEGATIVE

## 2017-01-15 MED ORDER — LORAZEPAM 2 MG/ML IJ SOLN
0.5000 mg | Freq: Once | INTRAMUSCULAR | Status: AC
  Administered 2017-01-15: 0.5 mg via INTRAVENOUS
  Filled 2017-01-15: qty 1

## 2017-01-15 MED ORDER — NYSTATIN 100000 UNIT/GM EX POWD
Freq: Two times a day (BID) | CUTANEOUS | Status: DC
  Administered 2017-01-15 – 2017-01-20 (×11): via TOPICAL
  Administered 2017-01-20: 1 g via TOPICAL
  Administered 2017-01-21 – 2017-01-23 (×5): via TOPICAL
  Filled 2017-01-15 (×2): qty 15

## 2017-01-15 MED ORDER — ROPINIROLE HCL 1 MG PO TABS
1.0000 mg | ORAL_TABLET | Freq: Two times a day (BID) | ORAL | Status: DC
  Administered 2017-01-15 – 2017-01-23 (×17): 1 mg via ORAL
  Filled 2017-01-15 (×21): qty 1

## 2017-01-15 MED ORDER — METOLAZONE 5 MG PO TABS
5.0000 mg | ORAL_TABLET | Freq: Once | ORAL | Status: AC
  Administered 2017-01-15: 5 mg via ORAL
  Filled 2017-01-15: qty 1

## 2017-01-15 MED ORDER — FUROSEMIDE 10 MG/ML IJ SOLN
6.0000 mg/h | INTRAVENOUS | Status: DC
  Administered 2017-01-15 – 2017-01-16 (×2): 8 mg/h via INTRAVENOUS
  Filled 2017-01-15 (×3): qty 25

## 2017-01-15 NOTE — Clinical Social Work Note (Signed)
Clinical Social Work Assessment  Patient Details  Name: Darrell Black MRN: [id number] Date of Birth: [birth year]  Date of referral:  01/15/17               Reason for consult:  Facility Placement                Permission sought to share information with:    Permission granted to share information::     Name::        Agency::  Darrell Black and Darrell Black at [facility]  Relationship::     Contact Information:     Housing/Transportation Living arrangements for the past 2 months:  [place] of Information:  Patient Patient Interpreter Needed:  None Criminal Activity/Legal Involvement Pertinent to Current Situation/Hospitalization:  No - Comment as needed Significant Relationships:  Other Family Members(nieces) Lives with:  Facility Resident Do you feel safe going back to the place where you live?  Yes Need for family participation in patient care:  Yes (Comment)  Care giving concerns:  None identified. Facility resident.    Social Worker assessment / plan:  Patient has been at [facility] since 12/31/16 and is currently receiving short term rehab. Patient was placed at [facility] from [facility]. Prior to being at [facility] he was at [facility].  Patient feeds himself, is able to bathe himself, and uses a wheelchair.  He is typically alert and oriented. Patient has two nieces who live out of town and out of state who eventually want to have patient transferred to one of their areas, however they continue to work out the logistics of this.  Patient can return to the facility at discharge.  LCSW to follow up with patient once he is stable.   Employment status:  Unemployed Forensic scientist:  Medicaid In [place] PT Recommendations:  Not assessed at this time Information / Referral to community resources:  [place]  Patient/Family's Response to care: Family is agreeable for patient to return.   Patient/Family's Understanding of and Emotional Response to Diagnosis, Current  Treatment, and Prognosis:  Facility communicated with patient's family his diagnosis, treatment and prognosis. LCSW will follow up with family.  Emotional Assessment Appearance:  Appears stated age Attitude/Demeanor/Rapport:    Affect (typically observed):  Pleasant Orientation:  Oriented to Self, Oriented to Place, Oriented to  Time, Oriented to Situation Alcohol / Substance use:  Not Applicable Psych involvement (Current and /or in the community):  No (Comment)  Discharge Needs  Concerns to be addressed:  No discharge needs identified Readmission within the last 30 days:  No Current discharge risk:  None Barriers to Discharge:  No Barriers Identified   Darrell Gully, LCSW 01/15/2017, 1:36 PM

## 2017-01-15 NOTE — Progress Notes (Signed)
No urine noted since lasix drip initiated. Pt unable to void. Bladder scan reveals approx 238 cc urine. MD notified. Orders received to place foley catheter. Peri care perform prior to insertion. On insertion, approx 400cc clear yellow urine drained into collection bag.  Pt tolerated well.

## 2017-01-15 NOTE — Progress Notes (Signed)
PROGRESS NOTE    Darrell Black  JWJ:[id number] DOB: 27-Sep-1955 DOA: 01/14/2017 PCP: Caprice Renshaw, MD   Brief Narrative:  61 year old male with a history of chronic combined systolic and diastolic heart failure, chronic respiratory failure, morbid obesity, admitted to the hospital with worsening volume overload and associated shortness of breath.  Found to be in decompensated CHF and admitted for IV diuresis.  He was started on BiPAP on admission, but had difficulty tolerating this and is currently on nasal cannula.   Assessment & Plan:   Active Problems:   Atrial fibrillation (HCC)   Hyperlipidemia   Chronic combined systolic and diastolic heart failure (HCC)   Acute on chronic respiratory failure with hypercapnia (HCC)   Acute on chronic systolic congestive heart failure (HCC)   Morbid obesity due to excess calories ([place])   ICD (implantable cardioverter-defibrillator) in place   Volume overload   Ischemic cardiomyopathy   CKD (chronic kidney disease) stage 3, GFR 30-59 ml/min (HCC)   Acute on chronic combined systolic and diastolic CHF (congestive heart failure) ([place])   1. Acute on chronic combined systolic and diastolic heart failure.  Ejection fraction of 35-40% per echo done 12/2016.  Patient takes 40 mg of Lasix daily as an outpatient with an extra 20 mg tablet as needed for excess fluid.  He presents with worsening lower extremity edema and shortness of breath.  He was started on Lasix 80 mg IV every 8 hours, but urine output has been unimpressive.  He does not feel that he has had good urine output.  We will transition the patient to intravenous Lasix infusion.  Monitor urine output closely.  We will also give a one-time dose of Zaroxolyn.  If urine output remains poor, may need cardiology input. 2. Acute on chronic respiratory failure with hypercapnia.  Patient has chronic hypercapnic respiratory failure with PCO2 in the 70s.  He does have sleep apnea and likely obesity  hypoventilation.  Reports using oxygen 3-4 L at all times.  Currently, he is on 4-5 L of oxygen. 3. Chronic kidney disease stage III.  Creatinine is mildly elevated on admission at 1.5.  This is improved to 1.3 with diuresis.  Continue to monitor the setting of ongoing diuresis. 4. Atrial fibrillation.  Currently in sinus rhythm.  Continue on amiodarone.  He is anticoagulated with Eliquis. 5. Hyperlipidemia.  Continue statin  6. Morbid obesity 7. Obstructive sleep apnea.  We will request records from skilled nursing facility as to whether the patient takes CPAP or BiPAP nightly.   DVT prophylaxis: Eliquis Code Status: Full code Family Communication: No family present Disposition Plan: Discharge back to nursing facility once improved   Consultants:     Procedures:     Antimicrobials:       Subjective: Still feels short of breath.  Has not had much urine output with intravenous Lasix.  Objective: Vitals:   01/15/17 0600 01/15/17 0744 01/15/17 0823 01/15/17 0900  BP: (!) 106/57 (!) 135/119 109/83 107/65  Pulse: 70  100 71  Resp: (!) 30  (!) 31 (!) 23  Temp:  (!) 97.4 F (36.3 C)    TempSrc:  Axillary    SpO2: 94%  93% 100%  Weight:      Height:        Intake/Output Summary (Last 24 hours) at 01/15/2017 0933 Last data filed at 01/15/2017 0844 Gross per 24 hour  Intake 800 ml  Output 700 ml  Net 100 ml   Filed Weights   01/14/17  1348 01/14/17 2315  Weight: (!) 159.7 kg (352 lb) (!) 158 kg (348 lb 5.2 oz)    Examination:  General exam: Appears calm and comfortable  Respiratory system: Diminished breath sounds bilaterally.  Some crackles at bases.  Increased respiratory effort. Cardiovascular system: S1 & S2 heard, RRR. No JVD, murmurs, rubs, gallops or clicks.  2-3+ pedal edema. Gastrointestinal system: Abdomen is nondistended, soft and nontender. No organomegaly or masses felt. Normal bowel sounds heard. Central nervous system: Alert and oriented. No focal  neurological deficits. Extremities: Symmetric 5 x 5 power. Skin: Venous stasis changes noted in lower extremities bilaterally Psychiatry: Judgement and insight appear normal. Mood & affect appropriate.     Data Reviewed: I have personally reviewed following labs and imaging studies  CBC: Recent Labs  Lab 01/14/17 1436  WBC 5.4  HGB 10.4*  HCT 34.9*  MCV 100.6*  PLT 606*   Basic Metabolic Panel: Recent Labs  Lab 01/14/17 1436 01/15/17 0414  NA 139 140  K 4.8 4.7  CL 94* 93*  CO2 35* 40*  GLUCOSE 117* 121*  BUN 50* 51*  CREATININE 1.52* 1.39*  CALCIUM 8.6* 8.7*   GFR: Estimated Creatinine Clearance: 82.3 mL/min (A) (by C-G formula based on SCr of 1.39 mg/dL (H)). Liver Function Tests: Recent Labs  Lab 01/14/17 1436  AST 20  ALT 18  ALKPHOS 121  BILITOT 1.2  PROT 6.8  ALBUMIN 3.6   No results for input(s): LIPASE, AMYLASE in the last 168 hours. No results for input(s): AMMONIA in the last 168 hours. Coagulation Profile: No results for input(s): INR, PROTIME in the last 168 hours. Cardiac Enzymes: Recent Labs  Lab 01/14/17 1436  TROPONINI <0.03   BNP (last 3 results) No results for input(s): PROBNP in the last 8760 hours. HbA1C: No results for input(s): HGBA1C in the last 72 hours. CBG: No results for input(s): GLUCAP in the last 168 hours. Lipid Profile: No results for input(s): CHOL, HDL, LDLCALC, TRIG, CHOLHDL, LDLDIRECT in the last 72 hours. Thyroid Function Tests: No results for input(s): TSH, T4TOTAL, FREET4, T3FREE, THYROIDAB in the last 72 hours. Anemia Panel: No results for input(s): VITAMINB12, FOLATE, FERRITIN, TIBC, IRON, RETICCTPCT in the last 72 hours. Sepsis Labs: No results for input(s): PROCALCITON, LATICACIDVEN in the last 168 hours.  Recent Results (from the past 240 hour(s))  MRSA PCR Screening     Status: None   Collection Time: 01/14/17 11:00 PM  Result Value Ref Range Status   MRSA by PCR NEGATIVE NEGATIVE Final     Comment:        The GeneXpert MRSA Assay (FDA approved for NASAL specimens only), is one component of a comprehensive MRSA colonization surveillance program. It is not intended to diagnose MRSA infection nor to guide or monitor treatment for MRSA infections.          Radiology Studies: Dg Chest 2 View  Result Date: 01/14/2017 CLINICAL DATA:  Shortness of Breath EXAM: CHEST  2 VIEW COMPARISON:  December 24, 2016 FINDINGS: There are small pleural effusions bilaterally. There is patchy atelectasis in the mid lower lung zones bilaterally. There is trace interstitial edema. There is cardiomegaly with pulmonary venous hypertension. Pacemaker leads are attached to the right atrium and right ventricle. Patient is status post median sternotomy. No adenopathy. There is degenerative change in the lower thoracic spine. IMPRESSION: Cardiomegaly with pulmonary venous hypertension consistent with a degree of pulmonary vascular congestion. Small pleural effusions and trace bibasilar edema noted. There is patchy atelectasis  in both mid and lower lung zones. Pacemaker leads attached to right atrium and right ventricle. Electronically Signed   By: Lowella Grip III M.D.   On: 01/14/2017 14:18        Scheduled Meds: . amiodarone  200 mg Oral Daily  . apixaban  5 mg Oral BID  . atorvastatin  80 mg Oral QPM  . carvedilol  3.125 mg Oral BID WC  . docusate sodium  100 mg Oral Daily  . gabapentin  100 mg Oral BID  . metolazone  5 mg Oral Once  . rOPINIRole  1 mg Oral QHS  . senna  1 tablet Oral QHS  . sodium chloride flush  3 mL Intravenous Q12H  . tiotropium  18 mcg Inhalation Daily   Continuous Infusions: . sodium chloride    . furosemide (LASIX) infusion       LOS: 1 day    Time spent: 3mins    Gifford Ballon, MD [facility] Pager [phone]  If 7PM-7AM, please contact night-coverage [email or web address] Password [facility] 01/15/2017, 9:33 AM

## 2017-01-15 NOTE — Progress Notes (Signed)
Pt placed on [facility] BIPAP for sleep. BIPAP is plugged into red outlet. Pt is tolerating well. Protective bandage placed on bridge of pt's nose to protect a scab on his nose.

## 2017-01-15 NOTE — Progress Notes (Addendum)
Pt requested the removal of BIPAP. Pt feels like he's not getting oxygen even after being reassured that his O2 levels were fine. Pt requested nasal cannula instead of BIPAP. Pt returned to nasal cannula. BIPAP on standby at bedside

## 2017-01-15 NOTE — NC FL2 (Signed)
[place] LEVEL OF CARE SCREENING TOOL     IDENTIFICATION  Patient Name: Darrell Black Birthdate: 04/06/1955 Sex: male Admission Date (Current Location): 01/14/2017  [facility] and [place] Number:  [employer] and Address:  [facility] [address] [place]      Provider Number: [id number]  Attending Physician Name and Address:  Kathie Dike, MD  Relative Name and Phone Number:       Current Level of Care: Hospital Recommended Level of Care: [place] Prior Approval Number:    Date Approved/Denied:   PASRR Number: [id number]  Discharge Plan: SNF    Current Diagnoses: Patient Active Problem List   Diagnosis Date Noted  . ICD (implantable cardioverter-defibrillator) in place 01/14/2017  . Volume overload 01/14/2017  . Ischemic cardiomyopathy 01/14/2017  . CKD (chronic kidney disease) stage 3, GFR 30-59 ml/min (HCC) 01/14/2017  . Acute on chronic combined systolic and diastolic CHF (congestive heart failure) ([place]) 01/14/2017  . Polyp of sigmoid colon   . Morbid obesity due to excess calories ([place]) 12/18/2016  . Hypernatremia 12/18/2016  . Rectal bleeding   . Acute on chronic respiratory failure with hypercapnia ([place])   . Visual hallucination   . Acute systolic congestive heart failure ([place])   . Acute on chronic systolic congestive heart failure ([place])   . 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 Acute respiratory insufficiency 12/12/2016  . Obstructive sleep apnea 12/12/2016  . Acute respiratory failure with hypoxia and hypercarbia ([place]) 12/12/2016  . Acute psychosis ([place]) 12/12/2016  . Atrial fibrillation ([place]) 12/12/2016  . Oral bleeding 12/12/2016  . Coronary artery disease 12/12/2016  . Restless leg syndrome 12/12/2016  . Hypertension 12/12/2016  . Hyperlipidemia 12/12/2016  . Chronic combined systolic and diastolic heart failure  ([place]) 12/12/2016  . Hyperammonemia ([place]) 12/12/2016    Orientation RESPIRATION BLADDER Height & Weight     Self, Time, Situation, Place  O2(4L) Continent Weight: (!) 348 lb 5.2 oz (158 kg) Height:  5\' 8"  (172.7 cm)  BEHAVIORAL SYMPTOMS/MOOD NEUROLOGICAL BOWEL NUTRITION STATUS      Continent Diet(see discharge summary)  AMBULATORY STATUS COMMUNICATION OF NEEDS Skin   Limited Assist(Patient currently is receiving PT but is using a wheelchair.) Verbally Normal                       Personal Care Assistance Level of Assistance  Bathing, Dressing, Feeding Bathing Assistance: Limited assistance Feeding assistance: Independent Dressing Assistance: Limited assistance     Functional Limitations Info  Sight, Hearing, Speech Sight Info: Impaired(wears glasses) Hearing Info: Adequate Speech Info: Adequate    SPECIAL CARE FACTORS FREQUENCY                       Contractures Contractures Info: Not present    Additional Factors Info  Code Status, Allergies Code Status Info: Full Allergies Info: Ampicillin, Clindamycin/lincomycin, Pencillins           Current Medications (01/15/2017):  This is the current hospital active medication list Current Facility-Administered Medications  Medication Dose Route Frequency Provider Last Rate Last Dose  . 0.9 %  sodium chloride infusion  250 mL Intravenous PRN Roney Jaffe, MD      . acetaminophen (TYLENOL) tablet 650 mg  650 mg Oral Q6H PRN Roney Jaffe, MD   650 mg at 01/15/17 1026   Or  . acetaminophen (  TYLENOL) suppository 650 mg  650 mg Rectal Q6H PRN Roney Jaffe, MD      . amiodarone (PACERONE) tablet 200 mg  200 mg Oral Daily Roney Jaffe, MD   200 mg at 01/15/17 1015  . apixaban (ELIQUIS) tablet 5 mg  5 mg Oral BID Roney Jaffe, MD   5 mg at 01/15/17 1015  . atorvastatin (LIPITOR) tablet 80 mg  80 mg Oral QPM Roney Jaffe, MD      . bisacodyl (DULCOLAX) suppository 10 mg  10 mg Rectal Daily PRN Roney Jaffe, MD      . carvedilol (COREG) tablet 3.125 mg  3.125 mg Oral BID WC Roney Jaffe, MD   3.125 mg at 01/15/17 0818  . docusate sodium (COLACE) capsule 100 mg  100 mg Oral Daily Roney Jaffe, MD   100 mg at 01/15/17 1015  . furosemide (LASIX) 250 mg in dextrose 5 % 250 mL (1 mg/mL) infusion  8 mg/hr Intravenous Continuous Kathie Dike, MD 8 mL/hr at 01/15/17 1021 8 mg/hr at 01/15/17 1021  . gabapentin (NEURONTIN) capsule 100 mg  100 mg Oral BID Roney Jaffe, MD   100 mg at 01/15/17 1015  . nystatin (MYCOSTATIN/NYSTOP) topical powder   Topical BID Kathie Dike, MD      . ondansetron (ZOFRAN) tablet 4 mg  4 mg Oral Q6H PRN Roney Jaffe, MD       Or  . ondansetron [facility]) injection 4 mg  4 mg Intravenous Q6H PRN Roney Jaffe, MD      . rOPINIRole (REQUIP) tablet 1 mg  1 mg Oral BID Kathie Dike, MD   1 mg at 01/15/17 1209  . senna (SENOKOT) tablet 8.6 mg  1 tablet Oral QHS Roney Jaffe, MD   8.6 mg at 01/15/17 0002  . sodium chloride flush (NS) 0.9 % injection 3 mL  3 mL Intravenous Q12H Roney Jaffe, MD   3 mL at 01/15/17 1016  . sodium chloride flush (NS) 0.9 % injection 3 mL  3 mL Intravenous PRN Roney Jaffe, MD      . tiotropium [facility]) inhalation capsule 18 mcg  18 mcg Inhalation Daily Roney Jaffe, MD   18 mcg at 01/15/17 1202     Discharge Medications: Please see discharge summary for a list of discharge medications.  Relevant Imaging Results:  Relevant Lab Results:   Additional Information    Deziya Amero, Clydene Pugh, LCSW

## 2017-01-16 ENCOUNTER — Inpatient Hospital Stay (HOSPITAL_COMMUNITY): Payer: Medicaid Other

## 2017-01-16 ENCOUNTER — Encounter (HOSPITAL_COMMUNITY): Payer: Self-pay | Admitting: Family Medicine

## 2017-01-16 DIAGNOSIS — Z95810 Presence of automatic (implantable) cardiac defibrillator: Secondary | ICD-10-CM

## 2017-01-16 DIAGNOSIS — I5023 Acute on chronic systolic (congestive) heart failure: Secondary | ICD-10-CM

## 2017-01-16 DIAGNOSIS — I255 Ischemic cardiomyopathy: Secondary | ICD-10-CM

## 2017-01-16 DIAGNOSIS — I5043 Acute on chronic combined systolic (congestive) and diastolic (congestive) heart failure: Secondary | ICD-10-CM

## 2017-01-16 DIAGNOSIS — N183 Chronic kidney disease, stage 3 (moderate): Secondary | ICD-10-CM

## 2017-01-16 MED ORDER — LORAZEPAM 2 MG/ML IJ SOLN
0.5000 mg | Freq: Once | INTRAMUSCULAR | Status: AC
  Administered 2017-01-16: 0.5 mg via INTRAVENOUS
  Filled 2017-01-16: qty 1

## 2017-01-16 NOTE — Progress Notes (Addendum)
Pt placed on  [facility] BIPAP for sleep. Pt tolerating well. BIPAP plugged into red outlet. Pt has a protective bandage over bridge of nose and on forehead.

## 2017-01-16 NOTE — Consult Note (Signed)
Cardiology Consult    Patient ID: Darrell Black; [id number]; 1955/07/05   Admit date: 01/14/2017 Date of Consult: 01/16/2017  Primary Care Provider: Caprice Renshaw, MD Primary Cardiologist: Dr. Kate Sable  Patient Profile    Darrell Black is a 61 y.o. male with past medical history of CAD (s/p CABG in 2001), Ischemic cardiomyopathy (s/p ICD placement), chronic combined systolic and diastolic CHF, chronic respiratory failure, persistent atrial fibrillation, HTN, HLD, Type 2 DM, Stage 3 CKD, and OSA (on CPAP)  who is being seen today for the evaluation of CHF at the request of Dr. Wynetta Emery.   History of Present Illness    Darrell Black was recently admitted in 12/2016 for atrial fibrillation with RVR and CHF exacerbation, diuresing over 6L with a discharge weight of 327 lbs. He was placed on Lasix 40mg  daily (having been increased to 60mg  daily by SNF). At the time of his hospital follow-up appointment on 01/14/2017, weight was noted to be significantly elevated at 352 lbs and he was significantly volume overloaded. He was therefore taken over to the ED and admitted for acute on chronic respiratory failure.   Initial labs showed WBC of 5.4, Hgb 10.4, platelets 137, creatinine 1.52, K+ 4.8, BNP 687, and initial troponin negative. EKG shows A-paced, HR 70, with IVCD. CXR showed cardiomegaly with pulmonary venous hypertension consistent with a degree of pulmonary vascular congestion. Small pleural effusions and trace bibasilar edema noted.  He initially required BiPAP but this has been weaned down to [place]. He was started on IV Lasix 80mg  TID on admission with minimal urine output, therefore was switched to a Lasix drip. Net output only recorded as -1.3L but he has over 600 mL in his cath bag currently and weight has declined from 352 lbs to 343 lbs.   He reports some improvement in his respiratory status but is still requiring BiPAP intermittently. Still has significant pitting edema. He denies any  recent chest pain, palpitations, dizziness, or presyncope.    Past Medical History   Past Medical History:  Diagnosis Date  . Atherosclerosis   . Atrial fibrillation ([place])   . 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 Chest pain   . Chronic combined systolic and diastolic heart failure ([place])    a. 12/2016: echo showing EF of 35-40% , diffuse HK, mild TR, and PA pressure of 38 mm Hg  . Chronic pain   . Coronary artery disease    a. s/p CABG in 2001  . Diabetes mellitus without complication ([place])    type two  . Hyperlipidemia   . Hypertension    essential  . Ischemic cardiomyopathy   . Major depressive disorder, recurrent ([place])   . Morbid obesity due to excess calories ([place])   . Muscle weakness (generalized)   . Neuropathy   . Obstructive sleep apnea   . Presence of automatic cardioverter/defibrillator (AICD)   . Renal disorder    Chronic kidney disease  . Restless leg syndrome   . Sick-euthyroid syndrome      Allergies:   Allergies  Allergen Reactions  . Ampicillin Rash  . Clindamycin/Lincomycin Rash  . Penicillins Rash    Has patient had a PCN reaction causing immediate rash, facial/tongue/throat swelling, SOB or lightheadedness with hypotension: Yes Has patient had a PCN reaction causing severe rash involving mucus membranes or skin necrosis: No Has patient had a PCN reaction that required hospitalization: No Has patient had a PCN reaction occurring within the last 10  years: No If all of the above answers are "NO", then may proceed with Cephalosporin use.     Home Medications:   Home Medications:  Prior to Admission medications   Medication Sig Start Date End Date Taking? Authorizing Provider  acetaminophen (TYLENOL) 325 MG tablet Take 650 mg by mouth every 6 (six) hours as needed.   Yes [provider]  amiodarone (PACERONE) 200 MG tablet Take 200 mg by mouth daily.   Yes [provider]  apixaban (ELIQUIS) 5 MG TABS  tablet Take 1 tablet (5 mg total) by mouth 2 (two) times daily. 12/31/16  Yes Isaac Bliss, Rayford Halsted, MD  atorvastatin (LIPITOR) 80 MG tablet Take 80 mg by mouth every evening.   Yes [provider]  carvedilol (COREG) 3.125 MG tablet Take 3.125 mg by mouth 2 (two) times daily with a meal.   Yes [provider]  docusate sodium (COLACE) 100 MG capsule Take 100 mg by mouth daily.   Yes [provider]  furosemide (LASIX) 20 MG tablet Take 20 mg daily as needed by mouth. Take every 24 hrs for edema   Yes [provider]  furosemide (LASIX) 40 MG tablet Take 40 mg daily by mouth.   Yes [provider]  gabapentin (NEURONTIN) 100 MG capsule Take 1 capsule (100 mg total) by mouth 2 (two) times daily. 12/31/16  Yes Isaac Bliss, Rayford Halsted, MD  guaiFENesin (ROBITUSSIN) 100 MG/5ML SOLN Take 5 mLs every 8 (eight) hours as needed by mouth for cough or to loosen phlegm.   Yes [provider]  ipratropium-albuterol (DUONEB) 0.5-2.5 (3) MG/3ML SOLN Take 3 mLs every 6 (six) hours as needed by nebulization (shortness of breath).   Yes [provider]  nitroGLYCERIN (NITROSTAT) 0.4 MG SL tablet Place 0.4 mg under the tongue every 5 (five) minutes as needed for chest pain.   Yes [provider]  rOPINIRole (REQUIP) 1 MG tablet Take 1 mg by mouth at bedtime.   Yes [provider]  senna (SENOKOT) 8.6 MG TABS tablet Take 1 tablet by mouth at bedtime.   Yes [provider]  tiotropium (SPIRIVA) 18 MCG inhalation capsule Place 18 mcg into inhaler and inhale daily.   Yes [provider]    Inpatient Medications    Scheduled Meds: . amiodarone  200 mg Oral Daily  . apixaban  5 mg Oral BID  . atorvastatin  80 mg Oral QPM  . carvedilol  3.125 mg Oral BID WC  . docusate sodium  100 mg Oral Daily  . gabapentin  100 mg Oral BID  . nystatin   Topical BID  . rOPINIRole  1 mg Oral BID  . senna  1 tablet Oral QHS  .  sodium chloride flush  3 mL Intravenous Q12H  . tiotropium  18 mcg Inhalation Daily   Continuous Infusions: . sodium chloride    . furosemide (LASIX) infusion 8 mg/hr (01/16/17 0800)   PRN Meds: sodium chloride, acetaminophen **OR** acetaminophen, bisacodyl, ondansetron **OR** ondansetron (ZOFRAN) IV, sodium chloride flush  Family History    Family History  Problem Relation Age of Onset  . Hypertension Father   . Cancer Maternal Grandmother   . Alcohol abuse Maternal Grandfather     Social History    Social History   Socioeconomic History  . Marital status: Single    Spouse name: Not on file  . Number of children: Not on file  . Years of education: Not on file  .  Highest education level: Not on file  Social Needs  . Financial resource strain: Not on file  . Food insecurity - worry: Not on file  . Food insecurity - inability: Not on file  . Transportation needs - medical: Not on file  . Transportation needs - non-medical: Not on file  Occupational History  . Not on file  Tobacco Use  . Smoking status: Never Smoker  . Smokeless tobacco: Never Used  Substance and Sexual Activity  . Alcohol use: No  . Drug use: No  . Sexual activity: No  Other Topics Concern  . Not on file  Social History Narrative  . Not on file     Review of Systems    General:  No chills, fever, night sweats or weight changes.  Cardiovascular:  No chest pain, orthopnea, palpitations, paroxysmal nocturnal dyspnea. Positive for dyspnea and edema.  Dermatological: No rash, lesions/masses Respiratory: No cough, Positive for dyspnea. Urologic: No hematuria, dysuria Abdominal:   No nausea, vomiting, diarrhea, bright red blood per rectum, melena, or hematemesis Neurologic:  No visual changes, wkns, changes in mental status. All other systems reviewed and are otherwise negative except as noted above.  Physical Exam/Data    Blood pressure 104/71, pulse 76, temperature 97.6 F (36.4 C),  temperature source Axillary, resp. rate 12, height 5\' 8"  (1.727 m), weight (!) 343 lb 0.6 oz (155.6 kg), SpO2 100 %.  General: Pleasant, obese Caucasian male appearing in NAD. BiPAP in place.  Psych: Normal affect. Neuro: Alert and oriented X 3. Moves all extremities spontaneously. HEENT: Normal  Neck: Supple without bruits. JVD difficult to assess secondary to body habitus. Lungs:  Resp regular and unlabored, rales along bases bilaterally. Heart: RRR no s3, s4, or murmurs. Abdomen: Soft, non-tender, non-distended, BS + x 4.  Extremities: No clubbing, 2+ pitting edema up to knees bilaterally. DP/PT/Radials 2+ and equal bilaterally.   EKG:  The EKG was personally reviewed and demonstrates:  A-paced, HR 70, with IVCD.  Telemetry:  Telemetry was personally reviewed and demonstrates:  A-paced, HR in 70's.    Labs/Studies     Relevant CV Studies: Echocardiogram: 12/2016 Study Conclusions  - Procedure narrative: Contrast enhancement employed. - Left ventricle: The cavity size was normal. Wall thickness was   increased in a pattern of mild LVH. Systolic function was   moderately reduced. The estimated ejection fraction was in the   range of 35% to 40%. Diffuse hypokinesis. The study was not   technically sufficient to allow evaluation of LV diastolic   dysfunction due to atrial fibrillation. - Regional wall motion abnormality: Akinesis of the apical septal   and apical myocardium; moderate hypokinesis of the basal-mid   anteroseptal myocardium. - Aortic valve: Mildly calcified leaflets. - Mitral valve: Moderately calcified annulus. Normal thickness   leaflets . - Left atrium: The atrium was mildly dilated. - Right ventricle: Pacer wire or catheter noted in right ventricle.   Systolic function was mildly reduced. - Tricuspid valve: There was mild regurgitation. - Pulmonary arteries: PA peak pressure: 38 mm Hg (S). - Systemic veins: IVC is dilated wtih normal respiratory variation.    Estimated CVP 8 mmHg.   Laboratory Data:  Chemistry Recent Labs  Lab 01/14/17 1436 01/15/17 0414  NA 139 140  K 4.8 4.7  CL 94* 93*  CO2 35* 40*  GLUCOSE 117* 121*  BUN 50* 51*  CREATININE 1.52* 1.39*  CALCIUM 8.6* 8.7*  GFRNONAA 48* 53*  GFRAA 55* >60  ANIONGAP 10 7  Recent Labs  Lab 01/14/17 1436  PROT 6.8  ALBUMIN 3.6  AST 20  ALT 18  ALKPHOS 121  BILITOT 1.2   Hematology Recent Labs  Lab 01/14/17 1436  WBC 5.4  RBC 3.47*  HGB 10.4*  HCT 34.9*  MCV 100.6*  MCH 30.0  MCHC 29.8*  RDW 16.5*  PLT 137*   Cardiac Enzymes Recent Labs  Lab 01/14/17 1436  TROPONINI <0.03   No results for input(s): TROPIPOC in the last 168 hours.  BNP Recent Labs  Lab 01/14/17 1436  BNP 687.0*    DDimer No results for input(s): DDIMER in the last 168 hours.  Radiology/Studies:  Dg Chest 2 View  Result Date: 01/14/2017 CLINICAL DATA:  Shortness of Breath EXAM: CHEST  2 VIEW COMPARISON:  December 24, 2016 FINDINGS: There are small pleural effusions bilaterally. There is patchy atelectasis in the mid lower lung zones bilaterally. There is trace interstitial edema. There is cardiomegaly with pulmonary venous hypertension. Pacemaker leads are attached to the right atrium and right ventricle. Patient is status post median sternotomy. No adenopathy. There is degenerative change in the lower thoracic spine. IMPRESSION: Cardiomegaly with pulmonary venous hypertension consistent with a degree of pulmonary vascular congestion. Small pleural effusions and trace bibasilar edema noted. There is patchy atelectasis in both mid and lower lung zones. Pacemaker leads attached to right atrium and right ventricle. Electronically Signed   By: Lowella Grip III M.D.   On: 01/14/2017 14:18     Assessment & Plan   1. Acute On Chronic Combined Systolic and Diastolic CHF - the patient has a known reduced EF of 35-40% by echo in 12/2016. He was admitted at that time and discharged on PO  Lasix but experienced significant fluid retention within the past 2 weeks. Baseline weight is approximately 327 lbs, up to 352 lbs at the time of admission. BNP 687 with CXR showing cardiomegaly with pulmonary venous hypertension consistent with a degree of pulmonary vascular congestion.  - initially placed on IV Lasix 80mg  TID but experienced minimal urine output, therefore was switched to a Lasix drip. Net output only recorded as -1.3L but he has over 600 mL in his cath bag currently and weight has declined from 352 lbs to 343 lbs. Would continue with Lasix drip as kidney function remains stable. Consider switching to Torsemide at the time of discharge for improved bioavailability.  - continue Coreg 3.125mg  BID. Not on ACe-I/ARB/ARNI secondary to hypotension.  2. Acute on Chronic Respiratory Failure - on 3-4L [place] at baseline. BiPAP currently in place. Continue with diuresis as above.   3. Ischemic Cardiomyopathy - s/p ICD placement. He is A-paced on telemetry.   4. Paroxysmal Atrial Fibrillation - HR remains well-controlled in the 70's. Continue Coreg 3.125mg  BID for rate-control. - he remains on Eliquis 5mg  BID for anticoagulation. Denies any evidence of active bleeding.   5. CAD - s/p CABG in 2001. - he denies any recent anginal symptoms.  - continue BB and statin therapy. No ASA secondary to the need for Eliquis.   6. Stage 3 CKD - baseline creatinine 1.3 - 1.4. Peaked up to 3.03 during 12/2016 admission.  - creatinine 1.52 on admission, stable at 1.39 this AM.   7. OSA - continue CPAP.   Signed, Erma Heritage, PA-C 01/16/2017, 9:24 AM Pager: [phone]   Attending note:  Patient seen and examined.  Reviewed records and discussed the case with Ms. Delano Metz.  Darrell Black presents with acute on chronic combined  heart failure with significant weight gain of approximately 25 pounds.  Recent outpatient diuretic regimen regimen included Lasix at 40 mg daily.  On examination  he is on BiPAP.  Heart rate is in the 70s with a ventricular paced rhythm by telemetry which I personally reviewed.  Systolic blood pressure in the 90-115 range.  He is morbidly obese, lungs exhibit diminished breath sounds throughout without wheezing.  Cardiac exam reveals indistinct PMI with RRR.  He has chronic appearing leg edema bilaterally.  Lab work reveals creatinine 1.39, potassium 4.7, BNP 687, troponin I less than 0.03.  Chest x-ray shows pulmonary vascular congestion with small right pleural effusion and an airspace opacity within the right midlung zone, mild interstitial edema.  Recent echocardiogram in October showed LVEF 35-40% range with indeterminate diastolic function.  Patient currently admitted for management of acute on chronic combined heart failure with weight gain of approximately 25 pounds.  Agree with transition from divided dose IV Lasix to Lasix infusion.  Dose can be adjusted depending on urine output and stability of renal function.  Otherwise continue Eliquis, amiodarone, Lipitor, and Coreg (low dose).  Hold off on ARB at this time although may consider adding if renal function remains stable (creatinine went up to 3.0 during last hospital stay).  Satira Sark, M.D., F.A.C.C.

## 2017-01-16 NOTE — Plan of Care (Signed)
Foley is draining clear yellow urine while patient is on lasix drip

## 2017-01-16 NOTE — Progress Notes (Addendum)
PROGRESS NOTE    Odin Mariani  QIW:[id number] DOB: 04-11-1955 DOA: 01/14/2017 PCP: Caprice Renshaw, MD   Brief Narrative:  61 year old male with a history of chronic combined systolic and diastolic heart failure, chronic respiratory failure, morbid obesity, admitted to the hospital with worsening volume overload and associated shortness of breath.  Found to be in decompensated CHF and admitted for IV diuresis.  He was started on BiPAP on admission, but had difficulty tolerating this and is currently on nasal cannula.  Assessment & Plan:   Active Problems:   Atrial fibrillation ([facility])   Hyperlipidemia   Chronic combined systolic and diastolic heart failure ([facility])   Acute on chronic respiratory failure with hypercapnia ([facility])   Acute on chronic systolic congestive heart failure ([facility])   Morbid obesity due to excess calories ([place])   ICD (implantable cardioverter-defibrillator) in place   Volume overload   Ischemic cardiomyopathy   CKD (chronic kidney disease) stage 3, GFR 30-59 ml/min ([facility])   Acute on chronic combined systolic and diastolic CHF (congestive heart failure) ([place])   1. Acute on chronic combined systolic and diastolic heart failure.  Ejection fraction of 35-40% per echo done 12/2016.  Patient takes 40 mg of Lasix daily as an outpatient with an extra 20 mg tablet as needed for excess fluid.  He presented with worsening lower extremity edema and shortness of breath.  He was started on Lasix 80 mg IV every 8 hours, but urine output was not good.  He was then started on intravenous Lasix infusion, he is having better urine output and weight slowly trending down.  Continue to monitor urine output closely.  He received a one-time dose of Zaroxolyn.  Add TED hoses and fluid restriction.  2. Acute on chronic respiratory failure with hypercapnia.  Patient has chronic hypercapnic respiratory failure with PCO2 in the 70s.  He does have sleep apnea and likely obesity hypoventilation.  Reports using  oxygen 3-4 L at all times.  Continue nightly bipap.  3. Chronic kidney disease stage III.  Creatinine is mildly elevated on admission at 1.5.  This is improved to 1.3 with diuresis.  Continue to monitor the setting of ongoing diuresis. 4. Atrial fibrillation.  Currently in sinus rhythm.  Continue on amiodarone.  He is anticoagulated with Eliquis. 5. Hyperlipidemia.  Continue statin  6. Morbid obesity 7. Obstructive sleep apnea.  We will request records from skilled nursing facility as to whether the patient takes CPAP or BiPAP nightly.   DVT prophylaxis: Eliquis Code Status: Full code Family Communication: No family present Disposition Plan: Discharge back to nursing facility once improved  Subjective: Reports less SOB, still has a lot of fluid in legs.    Objective: Vitals:   01/16/17 0100 01/16/17 0242 01/16/17 0400 01/16/17 0500  BP: 97/64   93/65  Pulse: 70   70  Resp: (!) 25   (!) 23  Temp:   97.6 F (36.4 C)   TempSrc:   Axillary   SpO2: 100% 100%  100%  Weight:    (!) 155.6 kg (343 lb 0.6 oz)  Height:        Intake/Output Summary (Last 24 hours) at 01/16/2017 0654 Last data filed at 01/16/2017 0500 Gross per 24 hour  Intake 440 ml  Output 1600 ml  Net -1160 ml   Filed Weights   01/14/17 1348 01/14/17 2315 01/16/17 0500  Weight: (!) 159.7 kg (352 lb) (!) 158 kg (348 lb 5.2 oz) (!) 155.6 kg (343 lb 0.6 oz)  Examination:  General exam: Appears calm and comfortable on bipap.   Respiratory system: Diminished breath sounds bilaterally.  rare crackles at bases.  On bipap.  Cardiovascular system: normal S1 & S2 heard.  2+ pedal edema bilateral.  Gastrointestinal system: Abdomen is nondistended, soft and nontender. No organomegaly or masses felt. Normal bowel sounds heard. Central nervous system: Alert and oriented. No focal neurological deficits. Extremities: 2+ pitting edema bilateral LEs. Symmetric 5 x 5 power. Skin: chronic venous stasis changes noted in lower  extremities bilaterally Psychiatry: Judgement and insight appear normal. Mood & affect appropriate.    Data Reviewed: I have personally reviewed following labs and imaging studies  CBC: Recent Labs  Lab 01/14/17 1436  WBC 5.4  HGB 10.4*  HCT 34.9*  MCV 100.6*  PLT 474*   Basic Metabolic Panel: Recent Labs  Lab 01/14/17 1436 01/15/17 0414  NA 139 140  K 4.8 4.7  CL 94* 93*  CO2 35* 40*  GLUCOSE 117* 121*  BUN 50* 51*  CREATININE 1.52* 1.39*  CALCIUM 8.6* 8.7*   GFR: Estimated Creatinine Clearance: 81.5 mL/min (A) (by C-G formula based on SCr of 1.39 mg/dL (H)). Liver Function Tests: Recent Labs  Lab 01/14/17 1436  AST 20  ALT 18  ALKPHOS 121  BILITOT 1.2  PROT 6.8  ALBUMIN 3.6   No results for input(s): LIPASE, AMYLASE in the last 168 hours. No results for input(s): AMMONIA in the last 168 hours. Coagulation Profile: No results for input(s): INR, PROTIME in the last 168 hours. Cardiac Enzymes: Recent Labs  Lab 01/14/17 1436  TROPONINI <0.03   BNP (last 3 results) No results for input(s): PROBNP in the last 8760 hours. HbA1C: No results for input(s): HGBA1C in the last 72 hours. CBG: No results for input(s): GLUCAP in the last 168 hours. Lipid Profile: No results for input(s): CHOL, HDL, LDLCALC, TRIG, CHOLHDL, LDLDIRECT in the last 72 hours. Thyroid Function Tests: No results for input(s): TSH, T4TOTAL, FREET4, T3FREE, THYROIDAB in the last 72 hours. Anemia Panel: No results for input(s): VITAMINB12, FOLATE, FERRITIN, TIBC, IRON, RETICCTPCT in the last 72 hours. Sepsis Labs: No results for input(s): PROCALCITON, LATICACIDVEN in the last 168 hours.  Recent Results (from the past 240 hour(s))  MRSA PCR Screening     Status: None   Collection Time: 01/14/17 11:00 PM  Result Value Ref Range Status   MRSA by PCR NEGATIVE NEGATIVE Final    Comment:        The GeneXpert MRSA Assay (FDA approved for NASAL specimens only), is one component of  a comprehensive MRSA colonization surveillance program. It is not intended to diagnose MRSA infection nor to guide or monitor treatment for MRSA infections.     Radiology Studies: Dg Chest 2 View  Result Date: 01/14/2017 CLINICAL DATA:  Shortness of Breath EXAM: CHEST  2 VIEW COMPARISON:  December 24, 2016 FINDINGS: There are small pleural effusions bilaterally. There is patchy atelectasis in the mid lower lung zones bilaterally. There is trace interstitial edema. There is cardiomegaly with pulmonary venous hypertension. Pacemaker leads are attached to the right atrium and right ventricle. Patient is status post median sternotomy. No adenopathy. There is degenerative change in the lower thoracic spine. IMPRESSION: Cardiomegaly with pulmonary venous hypertension consistent with a degree of pulmonary vascular congestion. Small pleural effusions and trace bibasilar edema noted. There is patchy atelectasis in both mid and lower lung zones. Pacemaker leads attached to right atrium and right ventricle. Electronically Signed   By: Gwyndolyn Saxon  Jasmine December III M.D.   On: 01/14/2017 14:18   Scheduled Meds: . amiodarone  200 mg Oral Daily  . apixaban  5 mg Oral BID  . atorvastatin  80 mg Oral QPM  . carvedilol  3.125 mg Oral BID WC  . docusate sodium  100 mg Oral Daily  . gabapentin  100 mg Oral BID  . nystatin   Topical BID  . rOPINIRole  1 mg Oral BID  . senna  1 tablet Oral QHS  . sodium chloride flush  3 mL Intravenous Q12H  . tiotropium  18 mcg Inhalation Daily   Continuous Infusions: . sodium chloride    . furosemide (LASIX) infusion 8 mg/hr (01/15/17 1021)     LOS: 2 days   Critical Care Time spent: 10 mins  Irwin Brakeman, MD [facility] Pager [phone]  If 7PM-7AM, please contact night-coverage [email or web address] Password [facility] 01/16/2017, 6:54 AM

## 2017-01-16 NOTE — Progress Notes (Signed)
BIPAP on standby. Pt tolerating  well

## 2017-01-17 ENCOUNTER — Inpatient Hospital Stay (HOSPITAL_COMMUNITY): Payer: Medicaid Other

## 2017-01-17 LAB — BLOOD GAS, ARTERIAL
Acid-Base Excess: 14.6 mmol/L — ABNORMAL HIGH (ref 0.0–2.0)
Bicarbonate: 35.4 mmol/L — ABNORMAL HIGH (ref 20.0–28.0)
DRAWN BY: 277331
O2 Content: 15 L/min
O2 Saturation: 86.5 %
PH ART: 7.246 — AB (ref 7.350–7.450)
Patient temperature: 37
pCO2 arterial: 101 mmHg (ref 32.0–48.0)
pO2, Arterial: 59 mmHg — ABNORMAL LOW (ref 83.0–108.0)

## 2017-01-17 LAB — BASIC METABOLIC PANEL
ANION GAP: 10 (ref 5–15)
BUN: 54 mg/dL — ABNORMAL HIGH (ref 6–20)
CO2: 43 mmol/L — AB (ref 22–32)
Calcium: 8.3 mg/dL — ABNORMAL LOW (ref 8.9–10.3)
Chloride: 86 mmol/L — ABNORMAL LOW (ref 101–111)
Creatinine, Ser: 1.61 mg/dL — ABNORMAL HIGH (ref 0.61–1.24)
GFR calc Af Amer: 52 mL/min — ABNORMAL LOW (ref >60)
GFR calc non Af Amer: 45 mL/min — ABNORMAL LOW (ref >60)
GLUCOSE: 106 mg/dL — AB (ref 65–99)
POTASSIUM: 3.6 mmol/L (ref 3.5–5.1)
Sodium: 139 mmol/L (ref 135–145)

## 2017-01-17 MED ORDER — POTASSIUM CHLORIDE CRYS ER 20 MEQ PO TBCR
20.0000 meq | EXTENDED_RELEASE_TABLET | Freq: Every day | ORAL | Status: DC
  Administered 2017-01-17 – 2017-01-21 (×5): 20 meq via ORAL
  Filled 2017-01-17 (×5): qty 1

## 2017-01-17 NOTE — Progress Notes (Signed)
**Note De-Identified  Obfuscation** Patient removed from [place] and placed on 6 L [place]; tolerating fairly well. SAT 85%.   RRT to continue to monitor

## 2017-01-17 NOTE — Progress Notes (Addendum)
Progress Note  Patient Name: Darrell Black Date of Encounter: 01/17/2017  Primary Cardiologist: Kate Sable, MD  Subjective   States he is feeling some better.  Sleeping a lot.  Tolerating CPAP.  Inpatient Medications    Scheduled Meds: . amiodarone  200 mg Oral Daily  . apixaban  5 mg Oral BID  . atorvastatin  80 mg Oral QPM  . carvedilol  3.125 mg Oral BID WC  . docusate sodium  100 mg Oral Daily  . gabapentin  100 mg Oral BID  . nystatin   Topical BID  . rOPINIRole  1 mg Oral BID  . senna  1 tablet Oral QHS  . sodium chloride flush  3 mL Intravenous Q12H  . tiotropium  18 mcg Inhalation Daily   Continuous Infusions: . sodium chloride    . furosemide (LASIX) infusion 8 mg/hr (01/16/17 1716)   PRN Meds: sodium chloride, acetaminophen **OR** acetaminophen, bisacodyl, ondansetron **OR** ondansetron (ZOFRAN) IV, sodium chloride flush   Vital Signs    Vitals:   01/17/17 0300 01/17/17 0400 01/17/17 0500 01/17/17 0600  BP: 101/66   92/69  Pulse:      Resp:      Temp:  (!) 97.1 F (36.2 C)    TempSrc:  Axillary    SpO2:      Weight:   (!) 338 lb 13.6 oz (153.7 kg)   Height:        Intake/Output Summary (Last 24 hours) at 01/17/2017 0726 Last data filed at 01/17/2017 0600 Gross per 24 hour  Intake 200 ml  Output 3600 ml  Net -3400 ml   Filed Weights   01/14/17 2315 01/16/17 0500 01/17/17 0500  Weight: (!) 348 lb 5.2 oz (158 kg) (!) 343 lb 0.6 oz (155.6 kg) (!) 338 lb 13.6 oz (153.7 kg)    Telemetry    Ventricular paced rhythm. Personally Reviewed.  Physical Exam   GEN:  Morbidly obese.  No acute distress.   Neck:  Increased girth, difficult to assess JVP. Cardiac: RRR, distant heart sounds no murmurs, rubs, or gallops.  Respiratory: Diminished breath sounds, no wheezing.[name] GI:  Obese with pannus, nontender. MS:  2-3+ bilateral leg edema, less in the thighs, bilaterally no deformity. Neuro:  Nonfocal  Psych: Normal affect   Labs     Chemistry Recent Labs  Lab 01/14/17 1436 01/15/17 0414 01/17/17 0459  NA 139 140 139  K 4.8 4.7 3.6  CL 94* 93* 86*  CO2 35* 40* 43*  GLUCOSE 117* 121* 106*  BUN 50* 51* 54*  CREATININE 1.52* 1.39* 1.61*  CALCIUM 8.6* 8.7* 8.3*  PROT 6.8  --   --   ALBUMIN 3.6  --   --   AST 20  --   --   ALT 18  --   --   ALKPHOS 121  --   --   BILITOT 1.2  --   --   GFRNONAA 48* 53* 45*  GFRAA 55* >60 52*  ANIONGAP 10 7 10      Hematology Recent Labs  Lab 01/14/17 1436  WBC 5.4  RBC 3.47*  HGB 10.4*  HCT 34.9*  MCV 100.6*  MCH 30.0  MCHC 29.8*  RDW 16.5*  PLT 137*    Cardiac Enzymes Recent Labs  Lab 01/14/17 1436  TROPONINI <0.03    BNP Recent Labs  Lab 01/14/17 1436  BNP 687.0*     Radiology    Dg Chest Port 1 View  Result Date: 01/17/2017  CLINICAL DATA:  61 year old male admitted 3 days ago with shortness of Breath, decompensated CHF. EXAM: PORTABLE CHEST 1 VIEW COMPARISON:  01/16/2017 and earlier. FINDINGS: Portable AP upright view at 0504 hours. Lordotic view. Stable cardiomegaly and mediastinal contours. Stable left chest cardiac AICD. Since 01/14/2017 pulmonary vascularity has not significantly changed. Basilar ventilation has slightly improved. No pneumothorax or large effusion. Negative visible bowel gas pattern. IMPRESSION: Minimal improved ventilation since 01/14/2017. Continued interstitial edema. Electronically Signed   By: Genevie Ann M.D.   On: 01/17/2017 06:42   Dg Chest Port 1 View  Result Date: 01/16/2017 CLINICAL DATA:  Congestive heart failure EXAM: PORTABLE CHEST 1 VIEW COMPARISON:  January 14, 2017 FINDINGS: There is cardiomegaly with pulmonary venous hypertension. Pacemaker leads are attached to the right atrium and right ventricle. There is a small right pleural effusion. There is patchy atelectasis in both lung bases. There is mild interstitial edema. There is focal airspace opacity in the right mid lung, new from 2 days prior. Patient is status  post coronary artery bypass grafting. No bone lesions. IMPRESSION: Stable pulmonary vascular congestion. Small right pleural effusion. New airspace opacity in the right mid lung, potentially representing alveolar edema but concerning for focus of pneumonia. Mild interstitial edema persists. Areas of atelectatic change in the bases remain. Electronically Signed   By: Lowella Grip III M.D.   On: 01/16/2017 09:48    Cardiac Studies   Echocardiogram: 12/12/2016:  Study Conclusions  - Procedure narrative: Contrast enhancement employed. - Left ventricle: The cavity size was normal. Wall thickness was increased in a pattern of mild LVH. Systolic function was moderately reduced. The estimated ejection fraction was in the range of 35% to 40%. Diffuse hypokinesis. The study was not technically sufficient to allow evaluation of LV diastolic dysfunction due to atrial fibrillation. - Regional wall motion abnormality: Akinesis of the apical septal and apical myocardium; moderate hypokinesis of the basal-mid anteroseptal myocardium. - Aortic valve: Mildly calcified leaflets. - Mitral valve: Moderately calcified annulus. Normal thickness leaflets . - Left atrium: The atrium was mildly dilated. - Right ventricle: Pacer wire or catheter noted in right ventricle. Systolic function was mildly reduced. - Tricuspid valve: There was mild regurgitation. - Pulmonary arteries: PA peak pressure: 38 mm Hg (S). - Systemic veins: IVC is dilated wtih normal respiratory variation. Estimated CVP 8 mmHg.  Patient Profile     61 y.o. male with past medical history of CAD (s/p CABG in 2001), Ischemic cardiomyopathy (s/p ICD placement), chronic combined systolic and diastolic CHF, chronic respiratory failure, persistent atrial fibrillation, HTN, HLD, Type 2 DM, Stage 3 CKD, and OSA (on CPAP)  who is admitted with acute on chronic combined heart failure.  Assessment & Plan    1. Acute on  Chronic Combined Systolic and Diastolic CHF: He is now on Lasix drip at 8 mg an hour.  Urine output has picked up over the last 24 hours, with output of 3.6 L within the last 24 hours.  A total of 4.78 L since admission.  Weight is down from documented admission weight of 252 pounds to 338 pounds.  During last hospitalization, his dry weight was 327 pounds.  We may need to decrease his dry weight on discharge.  Continue IV Lasix drip as he is responding well.  2.  Paroxysmal atrial fibrillation: Currently paced rhythm, heart rate controlled.  Continue amiodarone 200 mg daily, carvedilol 3.125 mg twice daily along with apixaban 5 mg twice daily.  3.  History of coronary  artery disease: Coronary artery bypass grafting in 2001.  He denies recurrent chest pain.  Potassium 3.6.  Would prefer to keep it at 4.0 in the setting of coronary artery disease.  The patient was complaining of "spasming" last night.  Will begin potassium 20 mEq daily.  Watch BMET closely in the setting of chronic kidney disease.  4.  Chronic kidney disease stage III: Baseline creatinine 1.3-1.4.  Creatinine currently 1.61.  5. ICD in situ: Interrogation and follow-up per protocol with Dr. Lovena Le.  Uncertain when last interrogated.  Signed, Phill Myron. West Pugh, ANP, AACC   01/17/2017, 7:26 AM     Attending note:  Patient seen and examined.  Reviewed interval hospital course and discussed with Ms. West Pugh.  Mr. Hands states that he is feeling somewhat better in terms of his shortness of breath, still has leg edema.  He has diuresed approximately 3400 cc net out more than in last 24 hours on Lasix drip at 8 mg/h.  On examination he is in no distress.  Systolic blood pressure is in the 90-110 range.  Heart rate is in the 70s with a ventricular paced rhythm by telemetry which I personally reviewed.  Lungs exhibit diminished breath sounds without wheezing.  Cardiac exam reveals distant RRR without gallop.  He is obese with  pannus and has chronic appearing 2-3+ leg edema.  Lab work shows creatinine up to 1.6, potassium 3.6.  Follow-up chest x-ray today shows persistent interstitial edema.  Continue management for acute on chronic combined heart failure.  Would recommend reducing Lasix infusion to 6 mg/h given somewhat progressive renal insufficiency.  Continue to follow urine output and BMET in a.m.  No other changes made to present regimen.  Satira Sark, M.D., F.A.C.C.

## 2017-01-17 NOTE — Progress Notes (Signed)
PROGRESS NOTE    Darrell Black  EXH:[id number] DOB: 08-28-1955 DOA: 01/14/2017 PCP: Caprice Renshaw, MD   Brief Narrative:  61 year old male with a history of chronic combined systolic and diastolic heart failure, chronic respiratory failure, morbid obesity, admitted to the hospital with worsening volume overload and associated shortness of breath.  Found to be in decompensated CHF and admitted for IV diuresis.  He was started on BiPAP on admission, but had difficulty tolerating this and is currently on nasal cannula.  Assessment & Plan:   Active Problems:   Atrial fibrillation (HCC)   Hyperlipidemia   Chronic combined systolic and diastolic heart failure (HCC)   Acute on chronic respiratory failure with hypercapnia (HCC)   Acute on chronic systolic congestive heart failure (HCC)   Morbid obesity due to excess calories ([place])   ICD (implantable cardioverter-defibrillator) in place   Volume overload   Ischemic cardiomyopathy   CKD (chronic kidney disease) stage 3, GFR 30-59 ml/min (HCC)   Acute on chronic combined systolic and diastolic CHF (congestive heart failure) ([place])   1. Acute on chronic combined systolic and diastolic heart failure.  Ejection fraction of 35-40% per echo done 12/2016.  Patient takes 40 mg of Lasix daily as an outpatient with an extra 20 mg tablet as needed for excess fluid.  He presented with worsening lower extremity edema and shortness of breath.  He was started on Lasix 80 mg IV every 8 hours, but urine output was not good.  He was then started on intravenous Lasix infusion, he is having better urine output and weight slowly trending down.  His weight is down to 338#.  His BPs are holding so far.  Continue to monitor urine output closely.  He received a one-time dose of Zaroxolyn.  Continue TED hoses and fluid restriction and elevating legs.  2. Acute on chronic respiratory failure with hypercapnia.  Patient has chronic hypercapnic respiratory failure with PCO2 in the  70s.  He does have sleep apnea and likely obesity hypoventilation.  Reports using oxygen 3-4 L at all times.  Continue nightly bipap.  3. Chronic kidney disease stage III.  Slight bump in Cr to 1.6, will continue to watch closely while diuresing.  4. Atrial fibrillation.  Currently in sinus rhythm.  Continue on amiodarone.  He is anticoagulated with Eliquis. 5. Hyperlipidemia.  Continue statin  6. Morbid obesity - stable.   7. Obstructive sleep apnea.  Continue nightly Bipap.   DVT prophylaxis: Eliquis Code Status: Full code Family Communication: No family present Disposition Plan: Discharge back to skilled nursing facility once improved  Subjective: Pt says he is breathing better this morning.     Objective: Vitals:   01/17/17 0300 01/17/17 0400 01/17/17 0500 01/17/17 0600  BP: 101/66   92/69  Pulse:      Resp:      Temp:  (!) 97.1 F (36.2 C)    TempSrc:  Axillary    SpO2:      Weight:   (!) 153.7 kg (338 lb 13.6 oz)   Height:        Intake/Output Summary (Last 24 hours) at 01/17/2017 0714 Last data filed at 01/17/2017 0600 Gross per 24 hour  Intake 200 ml  Output 3600 ml  Net -3400 ml   Filed Weights   01/14/17 2315 01/16/17 0500 01/17/17 0500  Weight: (!) 158 kg (348 lb 5.2 oz) (!) 155.6 kg (343 lb 0.6 oz) (!) 153.7 kg (338 lb 13.6 oz)   Examination:  General exam:  Appears calm and comfortable on bipap.   Respiratory system: Diminished breath sounds bilaterally.  rare crackles at bases.  On bipap.  Cardiovascular system: normal S1 & S2 heard.  2+ pedal edema bilateral.  Gastrointestinal system: Abdomen is nondistended, soft and nontender. No organomegaly or masses felt. Normal bowel sounds heard. Central nervous system: Alert and oriented. No focal neurological deficits. Extremities: 1+ pitting edema bilateral LEs. Symmetric 5 x 5 power. TED Hoses on.  Skin: chronic venous stasis changes noted in lower extremities bilaterally Psychiatry: Judgement and insight appear  normal. Mood & affect appropriate.   Data Reviewed: I have personally reviewed following labs and imaging studies  CBC: Recent Labs  Lab 01/14/17 1436  WBC 5.4  HGB 10.4*  HCT 34.9*  MCV 100.6*  PLT 518*   Basic Metabolic Panel: Recent Labs  Lab 01/14/17 1436 01/15/17 0414 01/17/17 0459  NA 139 140 139  K 4.8 4.7 3.6  CL 94* 93* 86*  CO2 35* 40* 43*  GLUCOSE 117* 121* 106*  BUN 50* 51* 54*  CREATININE 1.52* 1.39* 1.61*  CALCIUM 8.6* 8.7* 8.3*   GFR: Estimated Creatinine Clearance: 69.9 mL/min (A) (by C-G formula based on SCr of 1.61 mg/dL (H)). Liver Function Tests: Recent Labs  Lab 01/14/17 1436  AST 20  ALT 18  ALKPHOS 121  BILITOT 1.2  PROT 6.8  ALBUMIN 3.6   No results for input(s): LIPASE, AMYLASE in the last 168 hours. No results for input(s): AMMONIA in the last 168 hours. Coagulation Profile: No results for input(s): INR, PROTIME in the last 168 hours. Cardiac Enzymes: Recent Labs  Lab 01/14/17 1436  TROPONINI <0.03   BNP (last 3 results) No results for input(s): PROBNP in the last 8760 hours. HbA1C: No results for input(s): HGBA1C in the last 72 hours. CBG: No results for input(s): GLUCAP in the last 168 hours. Lipid Profile: No results for input(s): CHOL, HDL, LDLCALC, TRIG, CHOLHDL, LDLDIRECT in the last 72 hours. Thyroid Function Tests: No results for input(s): TSH, T4TOTAL, FREET4, T3FREE, THYROIDAB in the last 72 hours. Anemia Panel: No results for input(s): VITAMINB12, FOLATE, FERRITIN, TIBC, IRON, RETICCTPCT in the last 72 hours. Sepsis Labs: No results for input(s): PROCALCITON, LATICACIDVEN in the last 168 hours.  Recent Results (from the past 240 hour(s))  MRSA PCR Screening     Status: None   Collection Time: 01/14/17 11:00 PM  Result Value Ref Range Status   MRSA by PCR NEGATIVE NEGATIVE Final    Comment:        The GeneXpert MRSA Assay (FDA approved for NASAL specimens only), is one component of a comprehensive MRSA  colonization surveillance program. It is not intended to diagnose MRSA infection nor to guide or monitor treatment for MRSA infections.     Radiology Studies: Dg Chest Port 1 View  Result Date: 01/17/2017 CLINICAL DATA:  61 year old male admitted 3 days ago with shortness of Breath, decompensated CHF. EXAM: PORTABLE CHEST 1 VIEW COMPARISON:  01/16/2017 and earlier. FINDINGS: Portable AP upright view at 0504 hours. Lordotic view. Stable cardiomegaly and mediastinal contours. Stable left chest cardiac AICD. Since 01/14/2017 pulmonary vascularity has not significantly changed. Basilar ventilation has slightly improved. No pneumothorax or large effusion. Negative visible bowel gas pattern. IMPRESSION: Minimal improved ventilation since 01/14/2017. Continued interstitial edema. Electronically Signed   By: Genevie Ann M.D.   On: 01/17/2017 06:42   Dg Chest Port 1 View  Result Date: 01/16/2017 CLINICAL DATA:  Congestive heart failure EXAM: PORTABLE CHEST 1  VIEW COMPARISON:  January 14, 2017 FINDINGS: There is cardiomegaly with pulmonary venous hypertension. Pacemaker leads are attached to the right atrium and right ventricle. There is a small right pleural effusion. There is patchy atelectasis in both lung bases. There is mild interstitial edema. There is focal airspace opacity in the right mid lung, new from 2 days prior. Patient is status post coronary artery bypass grafting. No bone lesions. IMPRESSION: Stable pulmonary vascular congestion. Small right pleural effusion. New airspace opacity in the right mid lung, potentially representing alveolar edema but concerning for focus of pneumonia. Mild interstitial edema persists. Areas of atelectatic change in the bases remain. Electronically Signed   By: Lowella Grip III M.D.   On: 01/16/2017 09:48   Scheduled Meds: . amiodarone  200 mg Oral Daily  . apixaban  5 mg Oral BID  . atorvastatin  80 mg Oral QPM  . carvedilol  3.125 mg Oral BID WC  . docusate  sodium  100 mg Oral Daily  . gabapentin  100 mg Oral BID  . nystatin   Topical BID  . rOPINIRole  1 mg Oral BID  . senna  1 tablet Oral QHS  . sodium chloride flush  3 mL Intravenous Q12H  . tiotropium  18 mcg Inhalation Daily   Continuous Infusions: . sodium chloride    . furosemide (LASIX) infusion 8 mg/hr (01/16/17 1716)     LOS: 3 days   Critical Care Time spent: 78 mins  Irwin Brakeman, MD [facility] Pager [phone]  If 7PM-7AM, please contact night-coverage [email or web address] Password [email or web address] 01/17/2017, 7:14 AM

## 2017-01-18 DIAGNOSIS — I480 Paroxysmal atrial fibrillation: Secondary | ICD-10-CM

## 2017-01-18 LAB — BLOOD GAS, ARTERIAL
Acid-Base Excess: 15.7 mmol/L — ABNORMAL HIGH (ref 0.0–2.0)
Bicarbonate: 37.4 mmol/L — ABNORMAL HIGH (ref 20.0–28.0)
DRAWN BY: 277331
O2 CONTENT: 6 L/min
O2 SAT: 91.6 %
PATIENT TEMPERATURE: 37
PO2 ART: 68.5 mmHg — AB (ref 83.0–108.0)
pCO2 arterial: 90.5 mmHg (ref 32.0–48.0)
pH, Arterial: 7.296 — ABNORMAL LOW (ref 7.350–7.450)

## 2017-01-18 LAB — BASIC METABOLIC PANEL
ANION GAP: 9 (ref 5–15)
BUN: 58 mg/dL — ABNORMAL HIGH (ref 6–20)
CHLORIDE: 85 mmol/L — AB (ref 101–111)
CO2: 45 mmol/L — ABNORMAL HIGH (ref 22–32)
Calcium: 8.5 mg/dL — ABNORMAL LOW (ref 8.9–10.3)
Creatinine, Ser: 2.01 mg/dL — ABNORMAL HIGH (ref 0.61–1.24)
GFR calc Af Amer: 39 mL/min — ABNORMAL LOW (ref >60)
GFR, EST NON AFRICAN AMERICAN: 34 mL/min — AB (ref >60)
Glucose, Bld: 101 mg/dL — ABNORMAL HIGH (ref 65–99)
POTASSIUM: 3.9 mmol/L (ref 3.5–5.1)
Sodium: 139 mmol/L (ref 135–145)

## 2017-01-18 LAB — CBC
HEMATOCRIT: 36.3 % — AB (ref 39.0–52.0)
HEMOGLOBIN: 10.4 g/dL — AB (ref 13.0–17.0)
MCH: 29.8 pg (ref 26.0–34.0)
MCHC: 28.7 g/dL — ABNORMAL LOW (ref 30.0–36.0)
MCV: 104 fL — AB (ref 78.0–100.0)
Platelets: 172 10*3/uL (ref 150–400)
RBC: 3.49 MIL/uL — ABNORMAL LOW (ref 4.22–5.81)
RDW: 16.6 % — ABNORMAL HIGH (ref 11.5–15.5)
WBC: 5.8 10*3/uL (ref 4.0–10.5)

## 2017-01-18 LAB — SODIUM, URINE, RANDOM

## 2017-01-18 LAB — CREATININE, URINE, RANDOM: CREATININE, URINE: 248.43 mg/dL

## 2017-01-18 LAB — MAGNESIUM: Magnesium: 1.6 mg/dL — ABNORMAL LOW (ref 1.7–2.4)

## 2017-01-18 MED ORDER — TORSEMIDE 20 MG PO TABS: 60.0000 mg | ORAL_TABLET | Freq: Every day | ORAL | Status: DC

## 2017-01-18 MED ORDER — TORSEMIDE 20 MG PO TABS: 40.0000 mg | ORAL_TABLET | Freq: Every day | ORAL | Status: DC

## 2017-01-18 NOTE — Progress Notes (Signed)
**Note De-Identified  Obfuscation** Patient removed himself from BIPAP; SAT 84% on RA. Patient paced on 15 L HFNC; tolerating well.   RRT to continue to monitor

## 2017-01-18 NOTE — Consult Note (Addendum)
Consult requested by: [facility] Dr. 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  He was found to be in decompensated congestive heart failure started on IV diuresis required BiPAP because his PCO2 was greater than 100.  He woke up at about 6:00 this morning and was alert and oriented at that time.  When I see him he remains awake he is able to answer questions he knows where he is and he is on nasal cannula.  He has blood gas ordered this morning.  He denies chest pain.  He does not have a history of COPD and is a lifelong non-smoker.  He is not aware of any lung disease in his family.  He is not coughing no hemoptysis no nausea or vomiting he has had a headache no fever or chills.  Past Medical History:  Diagnosis Date  . Atherosclerosis   . Atrial fibrillation ([place])   . 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 Chest pain   . Chronic combined systolic and diastolic heart failure ([place])    a. 12/2016: echo showing EF of 35-40% , diffuse HK, mild TR, and PA pressure of 38 mm Hg  . Chronic pain   . Coronary artery disease    a. s/p CABG in 2001  . 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 Hyperlipidemia   . Hypertension    essential  . Ischemic cardiomyopathy   . Major depressive disorder, recurrent ([place])   . Morbid obesity due to excess calories ([place])   . Muscle weakness (generalized)   . Neuropathy   . Obstructive sleep apnea   . Presence of automatic cardioverter/defibrillator (AICD)   . Renal disorder    Chronic kidney disease  . Restless leg  syndrome   . Sick-euthyroid syndrome      Family History  Problem Relation Age of Onset  . Hypertension Father   . Cancer Maternal Grandmother   . Alcohol abuse Maternal Grandfather      Social History   Socioeconomic History  . Marital status: Single    Spouse name: None  . Number of children: None  . Years of education: None  . Highest education level: None  Social Needs  . Financial resource strain: None  . Food insecurity - worry: None  . Food insecurity - inability: None  . Transportation needs - medical: None  . Transportation needs - non-medical: None  Occupational History  . None  Tobacco Use  . Smoking status: Never Smoker  . Smokeless tobacco: Never Used  Substance and Sexual Activity  . Alcohol use: No  . Drug use: No  . Sexual activity: No  Other Topics Concern  . None  Social History Narrative  . None     ROS: Except as mentioned 10 point review of systems is negative    Objective: Vital signs in last 24 hours: Temp:  [97.3 F (36.3 C)-97.8 F (36.6 C)] 97.4 F (36.3 C) (11/09 0400) Pulse Rate:  [53-79] 70 (11/09 0600) Resp:  [18-31] 24 (11/09 0600) BP: (96-120)/(58-92) 112/78 (11/09 0600) SpO2:  [80 %-100 %] 90 % (11/09 0600) Weight:  [  154.6 kg (340 lb 13.3 oz)] 154.6 kg (340 lb 13.3 oz) (11/09 0500) Weight change: 0.9 kg (1 lb 15.8 oz)    Intake/Output from previous day: 11/08 0701 - 11/09 0700 In: 111.6 [I.V.:111.6] Out: 900 [Urine:900]  PHYSICAL EXAM Constitutional: He is awake and able to answer questions.  He is morbidly obese.  He is in no acute distress.  Eyes: His pupils react.  EOMI.  Ears nose mouth and throat: His throat is clear.  Hearing is grossly normal.  Neck is supple.  Cardiovascular: He is in atrial fib with controlled ventricular response.  Gastrointestinal: His abdomen is soft obese with no masses skin: He has edema of both legs up into the thighs.  Musculoskeletal: Strength is grossly normal.  Neurological: No focal  abnormalities.  Psychiatric: Normal mood and affect now.  Lab Results: Basic Metabolic Panel: Recent Labs    01/17/17 0459 01/18/17 0404  NA 139 139  K 3.6 3.9  CL 86* 85*  CO2 43* 45*  GLUCOSE 106* 101*  BUN 54* 58*  CREATININE 1.61* 2.01*  CALCIUM 8.3* 8.5*  MG  --  1.6*   Liver Function Tests: No results for input(s): AST, ALT, ALKPHOS, BILITOT, PROT, ALBUMIN in the last 72 hours. No results for input(s): LIPASE, AMYLASE in the last 72 hours. No results for input(s): AMMONIA in the last 72 hours. CBC: Recent Labs    01/18/17 0404  WBC 5.8  HGB 10.4*  HCT 36.3*  MCV 104.0*  PLT 172   Cardiac Enzymes: No results for input(s): CKTOTAL, CKMB, CKMBINDEX, TROPONINI in the last 72 hours. BNP: No results for input(s): PROBNP in the last 72 hours. D-Dimer: No results for input(s): DDIMER in the last 72 hours. CBG: No results for input(s): GLUCAP in the last 72 hours. Hemoglobin A1C: No results for input(s): HGBA1C in the last 72 hours. Fasting Lipid Panel: No results for input(s): CHOL, HDL, LDLCALC, TRIG, CHOLHDL, LDLDIRECT in the last 72 hours. Thyroid Function Tests: No results for input(s): TSH, T4TOTAL, FREET4, T3FREE, THYROIDAB in the last 72 hours. Anemia Panel: No results for input(s): VITAMINB12, FOLATE, FERRITIN, TIBC, IRON, RETICCTPCT in the last 72 hours. Coagulation: No results for input(s): LABPROT, INR in the last 72 hours. Urine Drug Screen: Drugs of Abuse     Component Value Date/Time   LABOPIA NONE DETECTED 12/10/2016 2120   COCAINSCRNUR NONE DETECTED 12/10/2016 2120   LABBENZ NONE DETECTED 12/10/2016 2120   AMPHETMU NONE DETECTED 12/10/2016 2120   THCU NONE DETECTED 12/10/2016 2120   LABBARB NONE DETECTED 12/10/2016 2120    Alcohol Level: No results for input(s): ETH in the last 72 hours. Urinalysis: No results for input(s): COLORURINE, LABSPEC, PHURINE, GLUCOSEU, HGBUR, BILIRUBINUR, KETONESUR, PROTEINUR, UROBILINOGEN, NITRITE, LEUKOCYTESUR  in the last 72 hours.  Invalid input(s): APPERANCEUR Misc. Labs:   ABGS: Recent Labs    01/17/17 1815  PHART 7.246*  PO2ART 59.0*  HCO3 35.4*     MICROBIOLOGY: Recent Results (from the past 240 hour(s))  MRSA PCR Screening     Status: None   Collection Time: 01/14/17 11:00 PM  Result Value Ref Range Status   MRSA by PCR NEGATIVE NEGATIVE Final    Comment:        The GeneXpert MRSA Assay (FDA approved for NASAL specimens only), is one component of a comprehensive MRSA colonization surveillance program. It is not intended to diagnose MRSA infection nor to guide or monitor treatment for MRSA infections.     Studies/Results: Dg [facility] [employer]  1 View  Result Date: 01/17/2017 CLINICAL DATA:  61 year old male admitted 3 days ago with shortness of Breath, decompensated CHF. EXAM: PORTABLE CHEST 1 VIEW COMPARISON:  01/16/2017 and earlier. FINDINGS: Portable AP upright view at 0504 hours. Lordotic view. Stable cardiomegaly and mediastinal contours. Stable left chest cardiac AICD. Since 01/14/2017 pulmonary vascularity has not significantly changed. Basilar ventilation has slightly improved. No pneumothorax or large effusion. Negative visible bowel gas pattern. IMPRESSION: Minimal improved ventilation since 01/14/2017. Continued interstitial edema. Electronically Signed   By: Genevie Ann M.D.   On: 01/17/2017 06:42   Dg Chest Port 1 View  Result Date: 01/16/2017 CLINICAL DATA:  Congestive heart failure EXAM: PORTABLE CHEST 1 VIEW COMPARISON:  January 14, 2017 FINDINGS: There is cardiomegaly with pulmonary venous hypertension. Pacemaker leads are attached to the right atrium and right ventricle. There is a small right pleural effusion. There is patchy atelectasis in both lung bases. There is mild interstitial edema. There is focal airspace opacity in the right mid lung, new from 2 days prior. Patient is status post coronary artery bypass grafting. No bone lesions. IMPRESSION: Stable pulmonary  vascular congestion. Small right pleural effusion. New airspace opacity in the right mid lung, potentially representing alveolar edema but concerning for focus of pneumonia. Mild interstitial edema persists. Areas of atelectatic change in the bases remain. Electronically Signed   By: Lowella Grip III M.D.   On: 01/16/2017 09:48    Medications:  Prior to Admission:  Medications Prior to Admission  Medication Sig Dispense Refill Last Dose  . acetaminophen (TYLENOL) 325 MG tablet Take 650 mg by mouth every 6 (six) hours as needed.   unknown  . amiodarone (PACERONE) 200 MG tablet Take 200 mg by mouth daily.   01/14/2017 at Unknown time  . apixaban (ELIQUIS) 5 MG TABS tablet Take 1 tablet (5 mg total) by mouth 2 (two) times daily. 60 tablet  01/14/2017 at 1000  . atorvastatin (LIPITOR) 80 MG tablet Take 80 mg by mouth every evening.   01/13/2017 at Unknown time  . carvedilol (COREG) 3.125 MG tablet Take 3.125 mg by mouth 2 (two) times daily with a meal.   01/14/2017 at Unknown time  . docusate sodium (COLACE) 100 MG capsule Take 100 mg by mouth daily.   01/14/2017 at Unknown time  . furosemide (LASIX) 20 MG tablet Take 20 mg daily as needed by mouth. Take every 24 hrs for edema   unknown  . furosemide (LASIX) 40 MG tablet Take 40 mg daily by mouth.   01/14/2017 at Unknown time  . gabapentin (NEURONTIN) 100 MG capsule Take 1 capsule (100 mg total) by mouth 2 (two) times daily.   01/14/2017 at Unknown time  . guaiFENesin (ROBITUSSIN) 100 MG/5ML SOLN Take 5 mLs every 8 (eight) hours as needed by mouth for cough or to loosen phlegm.   unknown  . ipratropium-albuterol (DUONEB) 0.5-2.5 (3) MG/3ML SOLN Take 3 mLs every 6 (six) hours as needed by nebulization (shortness of breath).   unknown  . nitroGLYCERIN (NITROSTAT) 0.4 MG SL tablet Place 0.4 mg under the tongue every 5 (five) minutes as needed for chest pain.   unknown  . rOPINIRole (REQUIP) 1 MG tablet Take 1 mg by mouth at bedtime.   01/13/2017 at Unknown  time  . senna (SENOKOT) 8.6 MG TABS tablet Take 1 tablet by mouth at bedtime.   01/13/2017 at Unknown time  . tiotropium (SPIRIVA) 18 MCG inhalation capsule Place 18 mcg into inhaler and inhale daily.  01/14/2017 at Unknown time   Scheduled: . amiodarone  200 mg Oral Daily  . apixaban  5 mg Oral BID  . atorvastatin  80 mg Oral QPM  . carvedilol  3.125 mg Oral BID WC  . docusate sodium  100 mg Oral Daily  . gabapentin  100 mg Oral BID  . nystatin   Topical BID  . potassium chloride  20 mEq Oral Daily  . rOPINIRole  1 mg Oral BID  . senna  1 tablet Oral QHS  . sodium chloride flush  3 mL Intravenous Q12H  . tiotropium  18 mcg Inhalation Daily   Continuous: . sodium chloride    . furosemide (LASIX) infusion Stopped (01/17/17 2332)   GYI:RSWNIO chloride, acetaminophen **OR** acetaminophen, bisacodyl, ondansetron **OR** ondansetron (ZOFRAN) IV, sodium chloride flush  Assesment: He was admitted with acute on chronic combined systolic and diastolic heart failure.  He was markedly volume overloaded and he is being diuresed.  Additionally he has acute on chronic hypoxic and hypercapnic respiratory failure and his PCO2 was 100 yesterday.  He was very somnolent with that.  He is more awake now.  He has repeat blood gas pending.  He is down by approximately 5.5 L.  He has chronic kidney disease and his renal function is a little bit worse this morning.  He is not wheezing so I do not think the addition of bronchodilators would help very much but if he starts wheezing I would add Xopenex.  He is already on Spiriva.  I would plan resumption of BiPAP based on clinical appearance.  He has substantial risk of needing more BiPAP or of needing intubation and mechanical ventilation but he may be able to avoid this since he has had significant diuresis. Active Problems:   Atrial fibrillation ([facility])   Hyperlipidemia   Chronic combined systolic and diastolic heart failure ([facility])   Acute on chronic respiratory  failure with hypercapnia ([facility])   Acute on chronic systolic congestive heart failure ([facility])   Morbid obesity due to excess calories ([place])   ICD (implantable cardioverter-defibrillator) in place   Volume overload   Ischemic cardiomyopathy   CKD (chronic kidney disease) stage 3, GFR 30-59 ml/min ([facility])   Acute on chronic combined systolic and diastolic CHF (congestive heart failure) ([place])    Plan: As above.  Thanks for allowing me to see him with you.  I will not be available this weekend but will return on 01/21/2017.    LOS: 4 days   Darrell Black L 01/18/2017, 7:53 AM

## 2017-01-18 NOTE — Progress Notes (Signed)
Progress Note  Patient Name: Darrell Black Date of Encounter: 01/18/2017  Primary Cardiologist: Kate Sable, MD  Subjective   Breathing better, no chest pain or palpitations.  Inpatient Medications    Scheduled Meds: . amiodarone  200 mg Oral Daily  . apixaban  5 mg Oral BID  . atorvastatin  80 mg Oral QPM  . carvedilol  3.125 mg Oral BID WC  . docusate sodium  100 mg Oral Daily  . gabapentin  100 mg Oral BID  . nystatin   Topical BID  . potassium chloride  20 mEq Oral Daily  . rOPINIRole  1 mg Oral BID  . senna  1 tablet Oral QHS  . sodium chloride flush  3 mL Intravenous Q12H  . tiotropium  18 mcg Inhalation Daily  . [START ON 01/19/2017] torsemide  40 mg Oral Daily   Continuous Infusions: . sodium chloride     PRN Meds: sodium chloride, acetaminophen **OR** acetaminophen, bisacodyl, ondansetron **OR** ondansetron (ZOFRAN) IV, sodium chloride flush   Vital Signs    Vitals:   01/18/17 0400 01/18/17 0500 01/18/17 0600 01/18/17 0811  BP: 111/75 111/73 112/78   Pulse: 69 69 70   Resp: (!) 31 (!) 25 (!) 24   Temp: (!) 97.4 F (36.3 C)     TempSrc: Axillary     SpO2: 95% 93% 90% 93%  Weight:  (!) 340 lb 13.3 oz (154.6 kg)    Height:        Intake/Output Summary (Last 24 hours) at 01/18/2017 0935 Last data filed at 01/18/2017 0500 Gross per 24 hour  Intake 95.57 ml  Output 900 ml  Net -804.43 ml   Filed Weights   01/16/17 0500 01/17/17 0500 01/18/17 0500  Weight: (!) 343 lb 0.6 oz (155.6 kg) (!) 338 lb 13.6 oz (153.7 kg) (!) 340 lb 13.3 oz (154.6 kg)    Telemetry    Paced rhythm with occasional PVC's.  Personally Reviewed  Physical Exam   GEN:  Obese male.  No acute distress.   Neck: No JVD Cardiac: RRR, distant heart sounds, no murmurs, rubs, or gallops.  Respiratory: Clear to auscultation bilaterally. GI: Nontender, non-distended  Edema improved in pannus.  MS: 1+ pretibial  edema; No deformity.Wearing TED hose.  Neuro:  Nonfocal  Psych:  Normal affect   Labs    Chemistry Recent Labs  Lab 01/14/17 1436 01/15/17 0414 01/17/17 0459 01/18/17 0404  NA 139 140 139 139  K 4.8 4.7 3.6 3.9  CL 94* 93* 86* 85*  CO2 35* 40* 43* 45*  GLUCOSE 117* 121* 106* 101*  BUN 50* 51* 54* 58*  CREATININE 1.52* 1.39* 1.61* 2.01*  CALCIUM 8.6* 8.7* 8.3* 8.5*  PROT 6.8  --   --   --   ALBUMIN 3.6  --   --   --   AST 20  --   --   --   ALT 18  --   --   --   ALKPHOS 121  --   --   --   BILITOT 1.2  --   --   --   GFRNONAA 48* 53* 45* 34*  GFRAA 55* >60 52* 39*  ANIONGAP 10 7 10 9      Hematology Recent Labs  Lab 01/14/17 1436 01/18/17 0404  WBC 5.4 5.8  RBC 3.47* 3.49*  HGB 10.4* 10.4*  HCT 34.9* 36.3*  MCV 100.6* 104.0*  MCH 30.0 29.8  MCHC 29.8* 28.7*  RDW 16.5* 16.6*  PLT 137* 172    Cardiac Enzymes Recent Labs  Lab 01/14/17 1436  TROPONINI <0.03   No results for input(s): TROPIPOC in the last 168 hours.   BNP Recent Labs  Lab 01/14/17 1436  BNP 687.0*     Radiology    Dg Chest Port 1 View  Result Date: 01/17/2017 CLINICAL DATA:  61 year old male admitted 3 days ago with shortness of Breath, decompensated CHF. EXAM: PORTABLE CHEST 1 VIEW COMPARISON:  01/16/2017 and earlier. FINDINGS: Portable AP upright view at 0504 hours. Lordotic view. Stable cardiomegaly and mediastinal contours. Stable left chest cardiac AICD. Since 01/14/2017 pulmonary vascularity has not significantly changed. Basilar ventilation has slightly improved. No pneumothorax or large effusion. Negative visible bowel gas pattern. IMPRESSION: Minimal improved ventilation since 01/14/2017. Continued interstitial edema. Electronically Signed   By: Genevie Ann M.D.   On: 01/17/2017 06:42   Dg Chest Port 1 View  Result Date: 01/16/2017 CLINICAL DATA:  Congestive heart failure EXAM: PORTABLE CHEST 1 VIEW COMPARISON:  January 14, 2017 FINDINGS: There is cardiomegaly with pulmonary venous hypertension. Pacemaker leads are attached to the right atrium and  right ventricle. There is a small right pleural effusion. There is patchy atelectasis in both lung bases. There is mild interstitial edema. There is focal airspace opacity in the right mid lung, new from 2 days prior. Patient is status post coronary artery bypass grafting. No bone lesions. IMPRESSION: Stable pulmonary vascular congestion. Small right pleural effusion. New airspace opacity in the right mid lung, potentially representing alveolar edema but concerning for focus of pneumonia. Mild interstitial edema persists. Areas of atelectatic change in the bases remain. Electronically Signed   By: Lowella Grip III M.D.   On: 01/16/2017 09:48    Cardiac Studies   Echocardiogram: 12/12/2016:  Study Conclusions  - Procedure narrative: Contrast enhancement employed. - Left ventricle: The cavity size was normal. Wall thickness was increased in a pattern of mild LVH. Systolic function was moderately reduced. The estimated ejection fraction was in the range of 35% to 40%. Diffuse hypokinesis. The study was not technically sufficient to allow evaluation of LV diastolic dysfunction due to atrial fibrillation. - Regional wall motion abnormality: Akinesis of the apical septal and apical myocardium; moderate hypokinesis of the basal-mid anteroseptal myocardium. - Aortic valve: Mildly calcified leaflets. - Mitral valve: Moderately calcified annulus. Normal thickness leaflets . - Left atrium: The atrium was mildly dilated. - Right ventricle: Pacer wire or catheter noted in right ventricle. Systolic function was mildly reduced. - Tricuspid valve: There was mild regurgitation. - Pulmonary arteries: PA peak pressure: 38 mm Hg (S). - Systemic veins: IVC is dilated wtih normal respiratory variation. Estimated CVP 8 mmHg.  Patient Profile     61 y.o. male with past medical history of CAD (s/p CABG in 2001), Ischemic cardiomyopathy (s/p ICD placement), chronic combined systolic and  diastolic CHF, chronic respiratory failure, persistent atrial fibrillation, HTN, HLD, Type 2 DM, Stage 3 CKD, and OSA (on CPAP)who is admitted with acute on chronic combined heart failure.  Assessment & Plan    1. Acute on Chronic Combined Systolic and Diastolic CHF:  He has diuresed 5.568 liters since admission, with 900 cc urine output documented overnight. Weight 340 lbs, up 2 lbs from yesterday. Down 10 lbs since admission. Review of labs, creatinine is 2.01, up from 1.61 yesterday. CO2 45. Will discontinue IV lasix gtt this am and transition to po torsemide. He was on lasix 40 mg daily as OP. Will change to 60  mg of torsemide.  2. PAF:  Now in paced rhythm with occasional PVC's. He remains on amiodarone, carvedilol 3.125 mg BID and apixaban 5 mg BID.   3. Coronary Artery Disease: Coronary artery bypass grafting in 2001.  He denies recurrent chest pain.  Potassium 3.6.  Would prefer to keep it at 4.0 in the setting of coronary artery disease.  The patient was complaining of "spasming" last night. Potassium improved to 3.9 this am.   4, CKD Stage III: Creatinine elevated significantly this am at 2.01 . Stopping IV lasix and changing to po torsemide in am. Baseline 1.3-1.4.  5. ICD in situ: Ongoing interrogation and follow up per EP.    Signed, Phill Myron. West Pugh, ANP, AACC   01/18/2017, 9:35 AM     Attending note:  Patient seen and examined.  Reviewed interval hospital course and discussed the case with Ms. West Pugh.  Net urine output decreased to 800 cc in the last 24 hours on Lasix infusion and creatinine has bumped up to 2.01 from 1.61.  He does report some improvement shortness of breath.  On examination heart rate is in the 70s with a ventricular paced rhythm on telemetry which I personally reviewed.  Blood pressure generally running 562-563 range systolic.  He is morbidly obese.  Lungs exhibit diminished breath sounds with scattered crackles at the bases.  Abdomen  protuberant with chronic appearing leg edema as well.  Lab work shows BUN 58, creatinine 2.0, potassium 3.9, hemoglobin 10.4, platelets 172.  Acute on chronic combined heart failure.  Weight 340 pounds is down from peak of 352 pounds.  We will try and transition from Lasix infusion to Demadex beginning 60 mg daily.  Follow urine output and BMET in a.m.  Continue amiodarone and Eliquis with history of PAF.  Maintain low-dose Coreg.  Replete potassium as needed.  Probably not a good candidate for Aldactone with fluctuating renal insufficiency.  Satira Sark, M.D., F.A.C.C.

## 2017-01-18 NOTE — Consult Note (Signed)
Reason for Consult:Renal failure Referring Physician: Dr. Traci Sermon Anstead is an 61 y.o. male.  HPI: He is a patient who has multiple past medical history including history of diabetes, hypertension, morbid obesity, coronary artery disease status post bypass surgery, status post AICD placement and chronic renal failure presently came with complaints of increased leg swelling, difficulty breathing and poor response to diuretics.  Patient has recurrent history of acute kidney injury superimposed on chronic.  Recently patient was admitted with similar history and transferred to [facility] because of upper GI bleeding.  His creatinine peaked up to 3 before return down to 1.5 possibly secondary to fluid removal.  According to the patient he has received hemodialysis in 2016 because of acute kidney injury.  Since then he was managed by using Lasix.  Presently he denies any nausea or vomiting.  He claims he is slightly better than the last couple of days.  He claims he has been taking his diuretics on regular basis without significant improvement.  Patient was seen by [facility] when he was in the hospital but did not keep outpatient follow-up.  Presently consult is called because of worsening of his renal failure.    Past Medical History:  Diagnosis Date  . Atherosclerosis   . Atrial fibrillation ([place])   . 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 Chest pain   . Chronic combined systolic and diastolic heart failure ([place])    a. 12/2016: echo showing EF of 35-40% , diffuse HK, mild TR, and PA pressure of 38 mm Hg  . Chronic pain   . Coronary artery disease    a. s/p CABG in 2001  . 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 Hyperlipidemia   . Hypertension    essential  . Ischemic cardiomyopathy   . Major depressive disorder, recurrent ([place])   . Morbid obesity due to excess calories ([place])   . Muscle weakness (generalized)   .  Neuropathy   . Obstructive sleep apnea   . Presence of automatic cardioverter/defibrillator (AICD)   . Renal disorder    Chronic kidney disease  . Restless leg syndrome   . Sick-euthyroid syndrome     History reviewed. No pertinent surgical history.  Family History  Problem Relation Age of Onset  . Hypertension Father   . Cancer Maternal Grandmother   . Alcohol abuse Maternal Grandfather     Social History:  reports that  has never smoked. he has never used smokeless tobacco. He reports that he does not drink alcohol or use drugs.  Allergies:  Allergies  Allergen Reactions  . Ampicillin Rash  . Clindamycin/Lincomycin Rash  . Penicillins Rash    Has patient had a PCN reaction causing immediate rash, facial/tongue/throat swelling, SOB or lightheadedness with hypotension: Yes Has patient had a PCN reaction causing severe rash involving mucus membranes or skin necrosis: No Has patient had a PCN reaction that required hospitalization: No Has patient had a PCN reaction occurring within the last 10 years: No If all of the above answers are "NO", then may proceed with Cephalosporin use.     Medications: I have reviewed the patient's current medications.  Results for orders placed or performed during the hospital encounter of 01/14/17 (from the past 48 hour(s))  Basic metabolic panel     Status: Abnormal   Collection Time: 01/17/17  4:59 AM  Result Value Ref Range   Sodium 139 135 -  145 mmol/L   Potassium 3.6 3.5 - 5.1 mmol/L   Chloride 86 (L) 101 - 111 mmol/L   CO2 43 (H) 22 - 32 mmol/L   Glucose, Bld 106 (H) 65 - 99 mg/dL   BUN 54 (H) 6 - 20 mg/dL   Creatinine, Ser 1.61 (H) 0.61 - 1.24 mg/dL   Calcium 8.3 (L) 8.9 - 10.3 mg/dL   GFR calc non Af Amer 45 (L) >60 mL/min   GFR calc Af Amer 52 (L) >60 mL/min    Comment: (NOTE) The eGFR has been calculated using the CKD EPI equation. This calculation has not been validated in all clinical situations. eGFR's persistently <60  mL/min signify possible Chronic Kidney Disease.    Anion gap 10 5 - 15  Blood gas, arterial     Status: Abnormal   Collection Time: 01/17/17  6:15 PM  Result Value Ref Range   O2 Content 15.0 L/min   Delivery systems NASAL CANNULA    pH, Arterial 7.246 (L) 7.350 - 7.450   pCO2 arterial 101 (HH) 32.0 - 48.0 mmHg    Comment: CRITICAL RESULT CALLED TO, READ BACK BY AND VERIFIED WITH: CRYSTAL MCGIBBONY RN, AT 1814 BY WENDY VIA, RRT,RCP ON 01/17/2017    pO2, Arterial 59.0 (L) 83.0 - 108.0 mmHg   Bicarbonate 35.4 (H) 20.0 - 28.0 mmol/L   Acid-Base Excess 14.6 (H) 0.0 - 2.0 mmol/L   O2 Saturation 86.5 %   Patient temperature 37.0    Collection site RIGHT RADIAL    Drawn by [id number]    Sample type ARTERIAL DRAW    Allens test (pass/fail) PASS PASS  Basic metabolic panel     Status: Abnormal   Collection Time: 01/18/17  4:04 AM  Result Value Ref Range   Sodium 139 135 - 145 mmol/L   Potassium 3.9 3.5 - 5.1 mmol/L   Chloride 85 (L) 101 - 111 mmol/L   CO2 45 (H) 22 - 32 mmol/L   Glucose, Bld 101 (H) 65 - 99 mg/dL   BUN 58 (H) 6 - 20 mg/dL   Creatinine, Ser 2.01 (H) 0.61 - 1.24 mg/dL   Calcium 8.5 (L) 8.9 - 10.3 mg/dL   GFR calc non Af Amer 34 (L) >60 mL/min   GFR calc Af Amer 39 (L) >60 mL/min    Comment: (NOTE) The eGFR has been calculated using the CKD EPI equation. This calculation has not been validated in all clinical situations. eGFR's persistently <60 mL/min signify possible Chronic Kidney Disease.    Anion gap 9 5 - 15  CBC     Status: Abnormal   Collection Time: 01/18/17  4:04 AM  Result Value Ref Range   WBC 5.8 4.0 - 10.5 K/uL   RBC 3.49 (L) 4.22 - 5.81 MIL/uL   Hemoglobin 10.4 (L) 13.0 - 17.0 g/dL   HCT 36.3 (L) 39.0 - 52.0 %   MCV 104.0 (H) 78.0 - 100.0 fL   MCH 29.8 26.0 - 34.0 pg   MCHC 28.7 (L) 30.0 - 36.0 g/dL   RDW 16.6 (H) 11.5 - 15.5 %   Platelets 172 150 - 400 K/uL  Magnesium     Status: Abnormal   Collection Time: 01/18/17  4:04 AM  Result Value Ref  Range   Magnesium 1.6 (L) 1.7 - 2.4 mg/dL    Dg Chest Port 1 View  Result Date: 01/17/2017 CLINICAL DATA:  61 year old male admitted 3 days ago with shortness of Breath, decompensated CHF. EXAM: PORTABLE CHEST 1  VIEW COMPARISON:  01/16/2017 and earlier. FINDINGS: Portable AP upright view at 0504 hours. Lordotic view. Stable cardiomegaly and mediastinal contours. Stable left chest cardiac AICD. Since 01/14/2017 pulmonary vascularity has not significantly changed. Basilar ventilation has slightly improved. No pneumothorax or large effusion. Negative visible bowel gas pattern. IMPRESSION: Minimal improved ventilation since 01/14/2017. Continued interstitial edema. Electronically Signed   By: Genevie Ann M.D.   On: 01/17/2017 06:42    Review of Systems  Constitutional: Negative for fever.  Respiratory: Positive for shortness of breath. Negative for cough and sputum production.   Cardiovascular: Positive for orthopnea and leg swelling. Negative for chest pain.  Gastrointestinal: Negative for abdominal pain, nausea and vomiting.  Neurological: Positive for weakness.   Blood pressure 112/78, pulse 70, temperature (!) 97.4 F (36.3 C), resp. rate (!) 24, height 5' 8"  (1.727 m), weight (!) 154.6 kg (340 lb 13.3 oz), SpO2 93 %. Physical Exam  Constitutional: He is oriented to person, place, and time. No distress.  Patient on Oxygen  Neck: No JVD present.  Cardiovascular:  irrigular rate and rythm  Respiratory: No respiratory distress. He has rales.  Decrease breath sound bilaterally  GI: Bowel sounds are normal. There is no tenderness. There is no rebound.  Full  Musculoskeletal: He exhibits edema.  Neurological: He is alert and oriented to person, place, and time.    Assessment/Plan: Problem #1 acute kidney injury superimposed on chronic.  Since patient was admitted to the hospital he was put on diuretics and he has lost about 5 L.  His creatinine has increased to around 2 from baseline of  1.3-1.4.  This increase could be secondary to fluid removal/cardiorenal.  During last admission his creatinine has gone up to 3 before he started declining to his baseline.  As stated above patient has been dialyzed at one time because of acute kidney injury.  The underlying chronic renal failure could be secondary to ischemia/cardiorenal/obesity related glomerulopathy. Problem #2 history of CHF: Patient is still with edema.  However he seems to be feeling better.  Patient is on Lasix and being followed by cardiology. 3] history of atrial fibrillation: His heart rate is controlled 4] history of coronary artery disease status post CABG and AICD placement 5] hypertension: Presently his blood pressure seems to be somewhat low 6] morbid obesity 7] diabetes Plan: We will check urine sodium and creatinine. 2] we will check his renal panel in the morning. 3] we will continue his Demadex.  If no significant change with his CHF probably will not metolazone to improve his urine output.  Yaeli Hartung S 01/18/2017, 10:06 AM

## 2017-01-18 NOTE — Progress Notes (Signed)
PROGRESS NOTE    Darrell Black  VOH:[id number] DOB: [birth year] DOA: 01/14/2017 PCP: Caprice Renshaw, MD   Brief Narrative:  61 year old male with a history of chronic combined systolic and diastolic heart failure, chronic respiratory failure, morbid obesity, admitted to the hospital with worsening volume overload and associated shortness of breath.  Found to be in decompensated CHF and admitted for IV diuresis.  He was started on BiPAP on admission, but had difficulty tolerating this and is currently on nasal cannula.  Assessment & Plan:   Active Problems:   Atrial fibrillation (HCC)   Hyperlipidemia   Chronic combined systolic and diastolic heart failure (HCC)   Acute on chronic respiratory failure with hypercapnia (HCC)   Acute on chronic systolic congestive heart failure (HCC)   Morbid obesity due to excess calories ([place])   ICD (implantable cardioverter-defibrillator) in place   Volume overload   Ischemic cardiomyopathy   CKD (chronic kidney disease) stage 3, GFR 30-59 ml/min (HCC)   Acute on chronic combined systolic and diastolic CHF (congestive heart failure) ([place])   1. Acute on chronic combined systolic and diastolic heart failure.  Ejection fraction of 35-40% per echo done 12/2016.  Patient takes 40 mg of Lasix daily as an outpatient with an extra 20 mg tablet as needed for excess fluid.  He presented with worsening lower extremity edema and shortness of breath.  He was started on Lasix 80 mg IV every 8 hours, but urine output was not good.  He was then started on intravenous Lasix infusion, he is having better urine output and weight slowly trending down.  His weight is down to 340#.  His BPs are holding so far.  Continue to monitor urine output closely.  He received a one-time dose of Zaroxolyn.  Continue TED hoses and fluid restriction and elevating legs.  2. Acute on chronic respiratory failure with hypercapnia.  Patient has chronic hypercapnic respiratory failure with PCO2 in the  70s.  He does have sleep apnea and likely obesity hypoventilation.  Reports using oxygen 3-4 L at all times.  Continue nightly bipap.  He did have episode of confusion yesterday and was markedly hypercarbic, placed on bipap with improvement. I asked for pulmonary consult this morning.   3. Chronic kidney disease stage III.  Slight bump in Cr to 2.01, will continue to watch closely while diuresing. Will ask for nephrology consult for assistance with diuresing him.   4. Atrial fibrillation.  Currently in sinus rhythm.  Continue on amiodarone.  He is anticoagulated with Eliquis. 5. Hyperlipidemia.  Continue statin  6. Morbid obesity - stable.   7. Obstructive sleep apnea.  Continue nightly Bipap.   DVT prophylaxis: Eliquis Code Status: Full code Family Communication: No family present Disposition Plan: Discharge back to skilled nursing facility once improved  Subjective: Pt confused this morning.       Objective: Vitals:   01/18/17 0300 01/18/17 0400 01/18/17 0500 01/18/17 0600  BP: 112/71 111/75 111/73 112/78  Pulse: 71 69 69 70  Resp: (!) 31 (!) 31 (!) 25 (!) 24  Temp:  (!) 97.4 F (36.3 C)    TempSrc:  Axillary    SpO2: (!) 84% 95% 93% 90%  Weight:   (!) 154.6 kg (340 lb 13.3 oz)   Height:        Intake/Output Summary (Last 24 hours) at 01/18/2017 0634 Last data filed at 01/18/2017 0500 Gross per 24 hour  Intake 111.57 ml  Output 900 ml  Net -788.43 ml  Filed Weights   01/16/17 0500 01/17/17 0500 01/18/17 0500  Weight: (!) 155.6 kg (343 lb 0.6 oz) (!) 153.7 kg (338 lb 13.6 oz) (!) 154.6 kg (340 lb 13.3 oz)   Examination:  General exam: Morbidly obese male. calm and comfortable on HFNC.   Respiratory system: Diminished breath sounds bilaterally.  rare crackles at bases.    Cardiovascular system: normal S1 & S2 heard.  2+ pedal edema bilateral.  Gastrointestinal system: Abdomen is nondistended, soft and nontender. No organomegaly or masses felt. Normal bowel sounds  heard. Central nervous system: Alert and oriented. No focal neurological deficits. Extremities: 1+ pitting edema bilateral LEs. Symmetric 5 x 5 power. TED Hoses on.  Skin: chronic venous stasis changes noted in lower extremities bilaterally Psychiatry: Judgement and insight appear normal. Mood & affect appropriate.   Data Reviewed: I have personally reviewed following labs and imaging studies  CBC: Recent Labs  Lab 01/14/17 1436 01/18/17 0404  WBC 5.4 5.8  HGB 10.4* 10.4*  HCT 34.9* 36.3*  MCV 100.6* 104.0*  PLT 137* 502   Basic Metabolic Panel: Recent Labs  Lab 01/14/17 1436 01/15/17 0414 01/17/17 0459 01/18/17 0404  NA 139 140 139 139  K 4.8 4.7 3.6 3.9  CL 94* 93* 86* 85*  CO2 35* 40* 43* 45*  GLUCOSE 117* 121* 106* 101*  BUN 50* 51* 54* 58*  CREATININE 1.52* 1.39* 1.61* 2.01*  CALCIUM 8.6* 8.7* 8.3* 8.5*  MG  --   --   --  1.6*   GFR: Estimated Creatinine Clearance: 56.2 mL/min (A) (by C-G formula based on SCr of 2.01 mg/dL (H)). Liver Function Tests: Recent Labs  Lab 01/14/17 1436  AST 20  ALT 18  ALKPHOS 121  BILITOT 1.2  PROT 6.8  ALBUMIN 3.6   No results for input(s): LIPASE, AMYLASE in the last 168 hours. No results for input(s): AMMONIA in the last 168 hours. Coagulation Profile: No results for input(s): INR, PROTIME in the last 168 hours. Cardiac Enzymes: Recent Labs  Lab 01/14/17 1436  TROPONINI <0.03   BNP (last 3 results) No results for input(s): PROBNP in the last 8760 hours. HbA1C: No results for input(s): HGBA1C in the last 72 hours. CBG: No results for input(s): GLUCAP in the last 168 hours. Lipid Profile: No results for input(s): CHOL, HDL, LDLCALC, TRIG, CHOLHDL, LDLDIRECT in the last 72 hours. Thyroid Function Tests: No results for input(s): TSH, T4TOTAL, FREET4, T3FREE, THYROIDAB in the last 72 hours. Anemia Panel: No results for input(s): VITAMINB12, FOLATE, FERRITIN, TIBC, IRON, RETICCTPCT in the last 72 hours. Sepsis  Labs: No results for input(s): PROCALCITON, LATICACIDVEN in the last 168 hours.  Recent Results (from the past 240 hour(s))  MRSA PCR Screening     Status: None   Collection Time: 01/14/17 11:00 PM  Result Value Ref Range Status   MRSA by PCR NEGATIVE NEGATIVE Final    Comment:        The GeneXpert MRSA Assay (FDA approved for NASAL specimens only), is one component of a comprehensive MRSA colonization surveillance program. It is not intended to diagnose MRSA infection nor to guide or monitor treatment for MRSA infections.     Radiology Studies: Dg Chest Port 1 View  Result Date: 01/17/2017 CLINICAL DATA:  61 year old male admitted 3 days ago with shortness of Breath, decompensated CHF. EXAM: PORTABLE CHEST 1 VIEW COMPARISON:  01/16/2017 and earlier. FINDINGS: Portable AP upright view at 0504 hours. Lordotic view. Stable cardiomegaly and mediastinal contours. Stable left chest  cardiac AICD. Since 01/14/2017 pulmonary vascularity has not significantly changed. Basilar ventilation has slightly improved. No pneumothorax or large effusion. Negative visible bowel gas pattern. IMPRESSION: Minimal improved ventilation since 01/14/2017. Continued interstitial edema. Electronically Signed   By: Genevie Ann M.D.   On: 01/17/2017 06:42   Dg Chest Port 1 View  Result Date: 01/16/2017 CLINICAL DATA:  Congestive heart failure EXAM: PORTABLE CHEST 1 VIEW COMPARISON:  January 14, 2017 FINDINGS: There is cardiomegaly with pulmonary venous hypertension. Pacemaker leads are attached to the right atrium and right ventricle. There is a small right pleural effusion. There is patchy atelectasis in both lung bases. There is mild interstitial edema. There is focal airspace opacity in the right mid lung, new from 2 days prior. Patient is status post coronary artery bypass grafting. No bone lesions. IMPRESSION: Stable pulmonary vascular congestion. Small right pleural effusion. New airspace opacity in the right mid  lung, potentially representing alveolar edema but concerning for focus of pneumonia. Mild interstitial edema persists. Areas of atelectatic change in the bases remain. Electronically Signed   By: Lowella Grip III M.D.   On: 01/16/2017 09:48   Scheduled Meds: . amiodarone  200 mg Oral Daily  . apixaban  5 mg Oral BID  . atorvastatin  80 mg Oral QPM  . carvedilol  3.125 mg Oral BID WC  . docusate sodium  100 mg Oral Daily  . gabapentin  100 mg Oral BID  . nystatin   Topical BID  . potassium chloride  20 mEq Oral Daily  . rOPINIRole  1 mg Oral BID  . senna  1 tablet Oral QHS  . sodium chloride flush  3 mL Intravenous Q12H  . tiotropium  18 mcg Inhalation Daily   Continuous Infusions: . sodium chloride    . furosemide (LASIX) infusion Stopped (01/17/17 2332)     LOS: 4 days   Critical Care Time spent: 78 mins  Irwin Brakeman, MD [facility] Pager [phone]  If 7PM-7AM, please contact night-coverage [email or web address] Password [facility] 01/18/2017, 6:34 AM

## 2017-01-19 LAB — BASIC METABOLIC PANEL
Anion gap: 9 (ref 5–15)
BUN: 69 mg/dL — AB (ref 6–20)
CHLORIDE: 89 mmol/L — AB (ref 101–111)
CO2: 43 mmol/L — AB (ref 22–32)
Calcium: 8.5 mg/dL — ABNORMAL LOW (ref 8.9–10.3)
Creatinine, Ser: 2.3 mg/dL — ABNORMAL HIGH (ref 0.61–1.24)
GFR calc non Af Amer: 29 mL/min — ABNORMAL LOW (ref >60)
GFR, EST AFRICAN AMERICAN: 34 mL/min — AB (ref >60)
GLUCOSE: 95 mg/dL (ref 65–99)
POTASSIUM: 4.2 mmol/L (ref 3.5–5.1)
SODIUM: 141 mmol/L (ref 135–145)

## 2017-01-19 LAB — RENAL FUNCTION PANEL
ANION GAP: 9 (ref 5–15)
Albumin: 3.2 g/dL — ABNORMAL LOW (ref 3.5–5.0)
BUN: 67 mg/dL — ABNORMAL HIGH (ref 6–20)
CHLORIDE: 89 mmol/L — AB (ref 101–111)
CO2: 43 mmol/L — AB (ref 22–32)
Calcium: 8.4 mg/dL — ABNORMAL LOW (ref 8.9–10.3)
Creatinine, Ser: 2.3 mg/dL — ABNORMAL HIGH (ref 0.61–1.24)
GFR, EST AFRICAN AMERICAN: 34 mL/min — AB (ref >60)
GFR, EST NON AFRICAN AMERICAN: 29 mL/min — AB (ref >60)
Glucose, Bld: 96 mg/dL (ref 65–99)
Phosphorus: 4.6 mg/dL (ref 2.5–4.6)
Potassium: 4.2 mmol/L (ref 3.5–5.1)
Sodium: 141 mmol/L (ref 135–145)

## 2017-01-19 LAB — BLOOD GAS, ARTERIAL
ACID-BASE EXCESS: 18.1 mmol/L — AB (ref 0.0–2.0)
Bicarbonate: 40.2 mmol/L — ABNORMAL HIGH (ref 20.0–28.0)
DRAWN BY: 22223
Delivery systems: POSITIVE
Expiratory PAP: 8
FIO2: 45
INSPIRATORY PAP: 18
LHR: 12 {breaths}/min
O2 SAT: 90.6 %
PCO2 ART: 86.1 mmHg — AB (ref 32.0–48.0)
PH ART: 7.336 — AB (ref 7.350–7.450)
PO2 ART: 65.1 mmHg — AB (ref 83.0–108.0)

## 2017-01-19 LAB — MAGNESIUM: Magnesium: 1.7 mg/dL (ref 1.7–2.4)

## 2017-01-19 MED ORDER — METOLAZONE 5 MG PO TABS
2.5000 mg | ORAL_TABLET | Freq: Every day | ORAL | Status: DC
  Administered 2017-01-19 – 2017-01-20 (×2): 2.5 mg via ORAL
  Filled 2017-01-19 (×2): qty 1

## 2017-01-19 MED ORDER — TORSEMIDE 20 MG PO TABS
60.0000 mg | ORAL_TABLET | Freq: Every day | ORAL | Status: DC
  Administered 2017-01-20: 60 mg via ORAL
  Filled 2017-01-19: qty 3

## 2017-01-19 NOTE — Progress Notes (Signed)
PROGRESS NOTE    Darrell Black  EYC:[id number] DOB: Feb 14, 1956 DOA: 01/14/2017 PCP: Caprice Renshaw, MD   Brief Narrative:  61 year old male with a history of chronic combined systolic and diastolic heart failure, chronic respiratory failure, morbid obesity, admitted to the hospital with worsening volume overload and associated shortness of breath.  Found to be in decompensated CHF and admitted for IV diuresis.  He was started on BiPAP on admission, but had difficulty tolerating this and is currently on nasal cannula.  Assessment & Plan:   Active Problems:   Atrial fibrillation (HCC)   Hyperlipidemia   Chronic combined systolic and diastolic heart failure (HCC)   Acute on chronic respiratory failure with hypercapnia (HCC)   Acute on chronic systolic congestive heart failure (HCC)   Morbid obesity due to excess calories ([place])   ICD (implantable cardioverter-defibrillator) in place   Volume overload   Ischemic cardiomyopathy   CKD (chronic kidney disease) stage 3, GFR 30-59 ml/min (HCC)   Acute on chronic combined systolic and diastolic CHF (congestive heart failure) ([place])   1. Acute on chronic combined systolic and diastolic heart failure.  Ejection fraction of 35-40% per echo done 12/2016.  Patient takes 40 mg of Lasix daily as an outpatient with an extra 20 mg tablet as needed for excess fluid.  He presented with worsening lower extremity edema and shortness of breath.  He was started on Lasix 80 mg IV every 8 hours, but urine output was not good.  He was then started on intravenous Lasix infusion, he is having better urine output and weight slowly trending down.  His weight is down to 342#.  His BPs are holding so far.  Continue to monitor urine output closely.  He received a one-time dose of Zaroxolyn.  Continue TED hoses and fluid restriction and elevating legs.   2. Acute on chronic respiratory failure with hypercapnia.  Patient has chronic hypercapnic respiratory failure with PCO2 in the  70s.  He does have sleep apnea and likely obesity hypoventilation.  Reports using oxygen 3-4 L at all times.  Continue bipap as much as he can tolerate during the day and give him breaks for meals but then place him back on bipap. His mental status is much improved when he is on bipap.  His CO2 is slowly trending down as evidenced by the morning ABG.  He did have episode of confusion 11/8 and was markedly hypercarbic, placed on bipap with improvement. I asked for pulmonary consult and the will see him again on 11/12.   3. Chronic kidney disease stage III.  His creatinine is rising, will continue to watch closely while diuresing. appreciate nephrology consult for assistance with diuresing him.   4. Atrial fibrillation.  Currently in sinus rhythm.  Continue on amiodarone.  He is anticoagulated with Eliquis (may need to switch to warfarin in setting of rising creatinine). 5. Hyperlipidemia.  Continue statin  6. Morbid obesity - stable.   7. Obstructive sleep apnea.  Continue nightly Bipap.   DVT prophylaxis: Eliquis Code Status: Full code Family Communication: No family present Disposition Plan: Discharge back to skilled nursing facility once improved  Subjective: Pt much more alert this morning and oriented, he is on bipap.    Objective: Vitals:   01/19/17 0300 01/19/17 0400 01/19/17 0407 01/19/17 0500  BP: (!) 102/59 104/61 104/61   Pulse: 70 70 70   Resp: (!) 23 (!) 23 (!) 23   Temp:  98.2 F (36.8 C)    TempSrc:  Axillary    SpO2: 93% 94% 93%   Weight:    (!) 155.3 kg (342 lb 6 oz)  Height:        Intake/Output Summary (Last 24 hours) at 01/19/2017 0743 Last data filed at 01/19/2017 0400 Gross per 24 hour  Intake 560 ml  Output 500 ml  Net 60 ml   Filed Weights   01/17/17 0500 01/18/17 0500 01/19/17 0500  Weight: (!) 153.7 kg (338 lb 13.6 oz) (!) 154.6 kg (340 lb 13.3 oz) (!) 155.3 kg (342 lb 6 oz)   Examination:  General exam: Morbidly obese male. calm and comfortable on  bipap.    Respiratory system: Diminished breath sounds bilaterally.    Cardiovascular system: normal S1 & S2 heard.  2+ pedal edema bilateral lower extremities.  Gastrointestinal system: Abdomen is nondistended, soft and nontender. No organomegaly or masses felt. Normal bowel sounds heard. Central nervous system: Alert and oriented. No focal neurological deficits. Extremities: 1/2+ pitting edema bilateral LEs. Symmetric 5 x 5 power. TED Hoses on.  Skin: chronic venous stasis changes noted in lower extremities bilaterally Psychiatry: Judgement and insight appear normal. Mood & affect appropriate.   Data Reviewed: I have personally reviewed following labs and imaging studies  CBC: Recent Labs  Lab 01/14/17 1436 01/18/17 0404  WBC 5.4 5.8  HGB 10.4* 10.4*  HCT 34.9* 36.3*  MCV 100.6* 104.0*  PLT 137* 106   Basic Metabolic Panel: Recent Labs  Lab 01/14/17 1436 01/15/17 0414 01/17/17 0459 01/18/17 0404 01/19/17 0404  NA 139 140 139 139 141  141  K 4.8 4.7 3.6 3.9 4.2  4.2  CL 94* 93* 86* 85* 89*  89*  CO2 35* 40* 43* 45* 43*  43*  GLUCOSE 117* 121* 106* 101* 96  95  BUN 50* 51* 54* 58* 67*  69*  CREATININE 1.52* 1.39* 1.61* 2.01* 2.30*  2.30*  CALCIUM 8.6* 8.7* 8.3* 8.5* 8.4*  8.5*  MG  --   --   --  1.6* 1.7  PHOS  --   --   --   --  4.6   GFR: Estimated Creatinine Clearance: 49.2 mL/min (A) (by C-G formula based on SCr of 2.3 mg/dL (H)). Liver Function Tests: Recent Labs  Lab 01/14/17 1436 01/19/17 0404  AST 20  --   ALT 18  --   ALKPHOS 121  --   BILITOT 1.2  --   PROT 6.8  --   ALBUMIN 3.6 3.2*   No results for input(s): LIPASE, AMYLASE in the last 168 hours. No results for input(s): AMMONIA in the last 168 hours. Coagulation Profile: No results for input(s): INR, PROTIME in the last 168 hours. Cardiac Enzymes: Recent Labs  Lab 01/14/17 1436  TROPONINI <0.03   BNP (last 3 results) No results for input(s): PROBNP in the last 8760  hours. HbA1C: No results for input(s): HGBA1C in the last 72 hours. CBG: No results for input(s): GLUCAP in the last 168 hours. Lipid Profile: No results for input(s): CHOL, HDL, LDLCALC, TRIG, CHOLHDL, LDLDIRECT in the last 72 hours. Thyroid Function Tests: No results for input(s): TSH, T4TOTAL, FREET4, T3FREE, THYROIDAB in the last 72 hours. Anemia Panel: No results for input(s): VITAMINB12, FOLATE, FERRITIN, TIBC, IRON, RETICCTPCT in the last 72 hours. Sepsis Labs: No results for input(s): PROCALCITON, LATICACIDVEN in the last 168 hours.  Recent Results (from the past 240 hour(s))  MRSA PCR Screening     Status: None   Collection Time: 01/14/17 11:00 PM  Result Value Ref Range Status   MRSA by PCR NEGATIVE NEGATIVE Final    Comment:        The GeneXpert MRSA Assay (FDA approved for NASAL specimens only), is one component of a comprehensive MRSA colonization surveillance program. It is not intended to diagnose MRSA infection nor to guide or monitor treatment for MRSA infections.     Radiology Studies: No results found. Scheduled Meds: . amiodarone  200 mg Oral Daily  . apixaban  5 mg Oral BID  . atorvastatin  80 mg Oral QPM  . carvedilol  3.125 mg Oral BID WC  . docusate sodium  100 mg Oral Daily  . gabapentin  100 mg Oral BID  . nystatin   Topical BID  . potassium chloride  20 mEq Oral Daily  . rOPINIRole  1 mg Oral BID  . senna  1 tablet Oral QHS  . sodium chloride flush  3 mL Intravenous Q12H  . tiotropium  18 mcg Inhalation Daily  . [START ON 01/20/2017] torsemide  60 mg Oral Daily   Continuous Infusions: . sodium chloride       LOS: 5 days   Critical Care Time spent: 33 mins  Irwin Brakeman, MD [facility] Pager [phone]  If 7PM-7AM, please contact night-coverage [email or web address] Password [facility] 01/19/2017, 7:43 AM

## 2017-01-19 NOTE — Progress Notes (Addendum)
Subjective: Interval History: has no complaint of nausea or vomiting.  His appetite is still good.  Patient also states that his breathing is okay.  He denies any difficulty in breathing.  The patient however complains of being confused this morning..  Objective: Vital signs in last 24 hours: Temp:  [97.4 F (36.3 C)-98.2 F (36.8 C)] 97.5 F (36.4 C) (11/10 0816) Pulse Rate:  [68-74] 70 (11/10 0820) Resp:  [14-33] 20 (11/10 0816) BP: (90-157)/(52-111) 110/67 (11/10 0820) SpO2:  [90 %-100 %] 100 % (11/10 0816) Weight:  [155.3 kg (342 lb 6 oz)] 155.3 kg (342 lb 6 oz) (11/10 0500) Weight change: 0.7 kg (1 lb 8.7 oz)  Intake/Output from previous day: 11/09 0701 - 11/10 0700 In: 560 [P.O.:560] Out: 500 [Urine:500] Intake/Output this shift: No intake/output data recorded.  General appearance: alert, cooperative and no distress Resp: diminished breath sounds bilaterally and rales posterior - bilateral Cardio: irregularly irregular rhythm Extremities: edema 1+ edema bilaterally  Lab Results: Recent Labs    01/18/17 0404  WBC 5.8  HGB 10.4*  HCT 36.3*  PLT 172   BMET:  Recent Labs    01/18/17 0404 01/19/17 0404  NA 139 141  141  K 3.9 4.2  4.2  CL 85* 89*  89*  CO2 45* 43*  43*  GLUCOSE 101* 96  95  BUN 58* 67*  69*  CREATININE 2.01* 2.30*  2.30*  CALCIUM 8.5* 8.4*  8.5*   No results for input(Black): PTH in the last 72 hours. Iron Studies: No results for input(Black): IRON, TIBC, TRANSFERRIN, FERRITIN in the last 72 hours.  Studies/Results: No results found.  I have reviewed the patient'Black current medications.  Assessment/Plan: 3] chronic combined systolic and diastolic heart failure: Presently he is on Demadex.  He is nonoliguric.  Denies any difficulty in breathing.  Chest x-ray from yesterday still shows some congestion does not seem to be significantly different. 2] renal failure: Acute on chronic chronic.  Possibly secondary cardiorenal.  This is a recurrent  issue and mostly secondary to fluid removal.  His underlying chronic renal failure could be secondary to diabetes/cardiorenal/obesity related glomerulopathy.  Patient presently denies any uremic signs and symptoms. 3]morbid obesity 4]sleep apnea: Patient on CPAP 5] bone and mineral disorder: His calcium and phosphorus is range 6] diabetes: His blood sugar is reasonably controlled 7] restless leg syndrome Plan: 1]We will continue his Demadex 2] will add metolazone 2.5 mg p.o.  daily 3] we will check his renal panel in the morning  LOS: 5 days   Darrell Black 01/19/2017,8:34 AM

## 2017-01-19 NOTE — Progress Notes (Signed)
CRITICAL VALUE ALERT  Critical Value:  PCO2 86.1, HCO3 40.2  Date & Time Notied:  01/19/17 0500am  Provider Notified: J.Kim  Orders Received/Actions taken: continue on bipap, let hawkins know when he gets here this morning.  Ericka Pontiff, RN 5:05 AM 01/19/17

## 2017-01-20 LAB — RENAL FUNCTION PANEL
ALBUMIN: 3.4 g/dL — AB (ref 3.5–5.0)
Anion gap: 8 (ref 5–15)
BUN: 71 mg/dL — AB (ref 6–20)
CHLORIDE: 86 mmol/L — AB (ref 101–111)
CO2: 45 mmol/L — ABNORMAL HIGH (ref 22–32)
CREATININE: 2.31 mg/dL — AB (ref 0.61–1.24)
Calcium: 8.5 mg/dL — ABNORMAL LOW (ref 8.9–10.3)
GFR, EST AFRICAN AMERICAN: 33 mL/min — AB (ref >60)
GFR, EST NON AFRICAN AMERICAN: 29 mL/min — AB (ref >60)
Glucose, Bld: 116 mg/dL — ABNORMAL HIGH (ref 65–99)
PHOSPHORUS: 3.9 mg/dL (ref 2.5–4.6)
POTASSIUM: 4.1 mmol/L (ref 3.5–5.1)
Sodium: 139 mmol/L (ref 135–145)

## 2017-01-20 LAB — CBC
HCT: 35.5 % — ABNORMAL LOW (ref 39.0–52.0)
Hemoglobin: 10.3 g/dL — ABNORMAL LOW (ref 13.0–17.0)
MCH: 30 pg (ref 26.0–34.0)
MCHC: 29 g/dL — AB (ref 30.0–36.0)
MCV: 103.5 fL — ABNORMAL HIGH (ref 78.0–100.0)
PLATELETS: 164 10*3/uL (ref 150–400)
RBC: 3.43 MIL/uL — AB (ref 4.22–5.81)
RDW: 17 % — AB (ref 11.5–15.5)
WBC: 4.9 10*3/uL (ref 4.0–10.5)

## 2017-01-20 LAB — BLOOD GAS, ARTERIAL
Acid-Base Excess: 18.6 mmol/L — ABNORMAL HIGH (ref 0.0–2.0)
Bicarbonate: 40.7 mmol/L — ABNORMAL HIGH (ref 20.0–28.0)
DELIVERY SYSTEMS: POSITIVE
Drawn by: 22223
EXPIRATORY PAP: 8
FIO2: 45
Inspiratory PAP: 18
LHR: 12 {breaths}/min
O2 Saturation: 93 %
PH ART: 7.344 — AB (ref 7.350–7.450)
pCO2 arterial: 85.4 mmHg (ref 32.0–48.0)
pO2, Arterial: 72.5 mmHg — ABNORMAL LOW (ref 83.0–108.0)

## 2017-01-20 LAB — MAGNESIUM: Magnesium: 1.7 mg/dL (ref 1.7–2.4)

## 2017-01-20 MED ORDER — TORSEMIDE 100 MG PO TABS
100.0000 mg | ORAL_TABLET | Freq: Every day | ORAL | Status: DC
  Administered 2017-01-21 – 2017-01-23 (×3): 100 mg via ORAL
  Filled 2017-01-20 (×3): qty 1

## 2017-01-20 MED ORDER — METOLAZONE 5 MG PO TABS
5.0000 mg | ORAL_TABLET | Freq: Every day | ORAL | Status: DC
  Administered 2017-01-21 – 2017-01-23 (×3): 5 mg via ORAL
  Filled 2017-01-20 (×3): qty 1

## 2017-01-20 NOTE — Progress Notes (Signed)
**Note De-Identified  Obfuscation** Oatient removed from BIPAP and placed on 10 L HFNC; tolerating well.  RRT to continue to monitor

## 2017-01-20 NOTE — Progress Notes (Signed)
Subjective: Interval History: He is feeling slightly better but still has difficulty breathing once in a while.  Feels much better with CPAP.  Appetite is okay he does not have any nausea or vomiting.  Objective: Vital signs in last 24 hours: Temp:  [97.6 F (36.4 C)-98.1 F (36.7 C)] 97.6 F (36.4 C) (11/11 0735) Pulse Rate:  [66-75] 70 (11/11 0813) Resp:  [20-35] 28 (11/11 0735) BP: (83-112)/(48-85) 111/70 (11/11 0813) SpO2:  [85 %-99 %] 98 % (11/11 0940) Weight:  [154.4 kg (340 lb 6.2 oz)] 154.4 kg (340 lb 6.2 oz) (11/11 0400) Weight change: -0.9 kg (-15.8 oz)  Intake/Output from previous day: 11/10 0701 - 11/11 0700 In: 243 [P.O.:240; I.V.:3] Out: 250 [Urine:250] Intake/Output this shift: Total I/O In: 483 [P.O.:480; I.V.:3] Out: -   General appearance: alert, cooperative and no distress Resp: diminished breath sounds bilaterally and rales posterior - bilateral Cardio: irregularly irregular rhythm Extremities: edema 1+ edema bilaterally  Lab Results: Recent Labs    01/18/17 0404 01/20/17 0343  WBC 5.8 4.9  HGB 10.4* 10.3*  HCT 36.3* 35.5*  PLT 172 164   BMET:  Recent Labs    01/19/17 0404 01/20/17 0343  NA 141  141 139  K 4.2  4.2 4.1  CL 89*  89* 86*  CO2 43*  43* 45*  GLUCOSE 96  95 116*  BUN 67*  69* 71*  CREATININE 2.30*  2.30* 2.31*  CALCIUM 8.4*  8.5* 8.5*   No results for input(Black): PTH in the last 72 hours. Iron Studies: No results for input(Black): IRON, TIBC, TRANSFERRIN, FERRITIN in the last 72 hours.  Studies/Results: No results found.  I have reviewed the patient'Black current medications.  Assessment/Plan: 3] chronic combined systolic and diastolic heart failure: Presently he is on Demadex and metolazone was added without significant change..  H Denies any worsening of his difficulty in breathing.  Patient is still with significant sign of fluid overload. 2] renal failure: Acute on chronic chronic.  Possibly secondary cardiorenal.  This  is a recurrent issue and mostly secondary to fluid removal.  His underlying chronic renal failure could be secondary to diabetes/cardiorenal/obesity related glomerulopathy.  His appetite is okay and does not have any nausea or vomiting. 3]morbid obesity 4]sleep apnea: Patient on CPAP 5] bone and mineral disorder: His calcium and phosphorus is range 6] diabetes: His blood sugar is reasonably controlled 7] restless leg syndrome Plan: 1]We will increase Demadex to 100 mg once a day and metolazone 5 mg once a day. 2] at this moment if no improvement with his urine output possibly adding low-dose of dopamine may be reasonable if cardiology agrees it..  If that does not seem to work will use IV Lasix and if his renal function continued to decline patient may require hemodialysis. 3] we will check his renal panel in the morning  LOS: 6 days   Darrell Black 01/20/2017,10:04 AM

## 2017-01-20 NOTE — Progress Notes (Signed)
PROGRESS NOTE    Darrell Black  GUR:[id number] DOB: 01-Nov-1955 DOA: 01/14/2017 PCP: Caprice Renshaw, MD   Brief Narrative:  61 year old male with a history of chronic combined systolic and diastolic heart failure, chronic respiratory failure, morbid obesity, admitted to the hospital with worsening volume overload and associated shortness of breath.  Found to be in decompensated CHF and admitted for IV diuresis.  He was started on BiPAP on admission, but had difficulty tolerating this and is currently on nasal cannula.  Assessment & Plan:   Active Problems:   Atrial fibrillation (HCC)   Hyperlipidemia   Chronic combined systolic and diastolic heart failure (HCC)   Acute on chronic respiratory failure with hypercapnia (HCC)   Acute on chronic systolic congestive heart failure (HCC)   Morbid obesity due to excess calories ([place])   ICD (implantable cardioverter-defibrillator) in place   Volume overload   Ischemic cardiomyopathy   CKD (chronic kidney disease) stage 3, GFR 30-59 ml/min (HCC)   Acute on chronic combined systolic and diastolic CHF (congestive heart failure) ([place])   1. Acute on chronic combined systolic and diastolic heart failure.  Ejection fraction of 35-40% per echo done 12/2016.  Patient takes 40 mg of Lasix daily as an outpatient with an extra 20 mg tablet as needed for excess fluid.  He presented with worsening lower extremity edema and shortness of breath.  He was started on Lasix 80 mg IV every 8 hours, but urine output was not good.  He was then started on intravenous Lasix infusion, he is having better urine output and weight slowly trending down.  His weight is down to 340#.  Appreciate nephrology assistance. Pt now on oral demadex and zaroxylyn.   Continue TED hoses and fluid restriction and elevating legs.   2. Acute on chronic respiratory failure with hypercapnia.  Patient has chronic hypercapnic respiratory failure with PCO2 in the 70s.  He does have sleep apnea and likely  obesity hypoventilation.  Reports using oxygen 3-4 L at all times.  Continue bipap as much as he can tolerate during the day and give him breaks for meals but then place him back on bipap. His mental status is much improved when he is on bipap.  His CO2 is slowly trending down as evidenced by the morning ABG.  He did have episode of confusion 11/8 and was markedly hypercarbic, placed on bipap with improvement. I asked for pulmonary consult and the will see him again on 11/12.   3. Chronic kidney disease stage III.  His creatinine still at 2.3, will continue to watch closely while diuresing. appreciate nephrology consult for assistance with diuresing him.   4. Atrial fibrillation.  Currently in sinus rhythm.  Continue on amiodarone.  He is anticoagulated with Eliquis (may need to switch to warfarin in setting of rising creatinine). 5. Hyperlipidemia.  Continue statin  6. Morbid obesity - stable.   7. Obstructive sleep apnea.  Continue nightly Bipap.   DVT prophylaxis: Eliquis Code Status: Full code Family Communication: No family present Disposition Plan: Discharge back to skilled nursing facility once improved  Subjective: Pt mentally much improved this morning.     Objective: Vitals:   01/20/17 0400 01/20/17 0731 01/20/17 0735 01/20/17 0813  BP: 112/73   111/70  Pulse: 69  70 70  Resp: (!) 26  (!) 28   Temp: 97.6 F (36.4 C)  97.6 F (36.4 C)   TempSrc: Axillary  Oral   SpO2: 97% 94% 97%   Weight: (!) 154.4  kg (340 lb 6.2 oz)     Height:        Intake/Output Summary (Last 24 hours) at 01/20/2017 0920 Last data filed at 01/20/2017 6314 Gross per 24 hour  Intake 726 ml  Output 250 ml  Net 476 ml   Filed Weights   01/18/17 0500 01/19/17 0500 01/20/17 0400  Weight: (!) 154.6 kg (340 lb 13.3 oz) (!) 155.3 kg (342 lb 6 oz) (!) 154.4 kg (340 lb 6.2 oz)   Examination:  General exam: Morbidly obese male. calm and comfortable on bipap.    Respiratory system: Diminished breath  sounds bilaterally.    Cardiovascular system: normal S1 & S2 heard.  2+ pedal edema bilateral lower extremities.  Gastrointestinal system: Abdomen is nondistended, soft and nontender. No organomegaly or masses felt. Normal bowel sounds heard. Central nervous system: Alert and oriented. No focal neurological deficits. Extremities: 1/2+ pitting edema bilateral LEs. Symmetric 5 x 5 power. TED Hoses on.  Skin: chronic venous stasis changes noted in lower extremities bilaterally Psychiatry: Judgement and insight appear normal. Mood & affect appropriate.   Data Reviewed: I have personally reviewed following labs and imaging studies  CBC: Recent Labs  Lab 01/14/17 1436 01/18/17 0404 01/20/17 0343  WBC 5.4 5.8 4.9  HGB 10.4* 10.4* 10.3*  HCT 34.9* 36.3* 35.5*  MCV 100.6* 104.0* 103.5*  PLT 137* 172 970   Basic Metabolic Panel: Recent Labs  Lab 01/15/17 0414 01/17/17 0459 01/18/17 0404 01/19/17 0404 01/20/17 0343  NA 140 139 139 141  141 139  K 4.7 3.6 3.9 4.2  4.2 4.1  CL 93* 86* 85* 89*  89* 86*  CO2 40* 43* 45* 43*  43* 45*  GLUCOSE 121* 106* 101* 96  95 116*  BUN 51* 54* 58* 67*  69* 71*  CREATININE 1.39* 1.61* 2.01* 2.30*  2.30* 2.31*  CALCIUM 8.7* 8.3* 8.5* 8.4*  8.5* 8.5*  MG  --   --  1.6* 1.7 1.7  PHOS  --   --   --  4.6 3.9   GFR: Estimated Creatinine Clearance: 48.8 mL/min (A) (by C-G formula based on SCr of 2.31 mg/dL (H)). Liver Function Tests: Recent Labs  Lab 01/14/17 1436 01/19/17 0404 01/20/17 0343  AST 20  --   --   ALT 18  --   --   ALKPHOS 121  --   --   BILITOT 1.2  --   --   PROT 6.8  --   --   ALBUMIN 3.6 3.2* 3.4*   No results for input(s): LIPASE, AMYLASE in the last 168 hours. No results for input(s): AMMONIA in the last 168 hours. Coagulation Profile: No results for input(s): INR, PROTIME in the last 168 hours. Cardiac Enzymes: Recent Labs  Lab 01/14/17 1436  TROPONINI <0.03   BNP (last 3 results) No results for input(s):  PROBNP in the last 8760 hours. HbA1C: No results for input(s): HGBA1C in the last 72 hours. CBG: No results for input(s): GLUCAP in the last 168 hours. Lipid Profile: No results for input(s): CHOL, HDL, LDLCALC, TRIG, CHOLHDL, LDLDIRECT in the last 72 hours. Thyroid Function Tests: No results for input(s): TSH, T4TOTAL, FREET4, T3FREE, THYROIDAB in the last 72 hours. Anemia Panel: No results for input(s): VITAMINB12, FOLATE, FERRITIN, TIBC, IRON, RETICCTPCT in the last 72 hours. Sepsis Labs: No results for input(s): PROCALCITON, LATICACIDVEN in the last 168 hours.  Recent Results (from the past 240 hour(s))  MRSA PCR Screening     Status:  None   Collection Time: 01/14/17 11:00 PM  Result Value Ref Range Status   MRSA by PCR NEGATIVE NEGATIVE Final    Comment:        The GeneXpert MRSA Assay (FDA approved for NASAL specimens only), is one component of a comprehensive MRSA colonization surveillance program. It is not intended to diagnose MRSA infection nor to guide or monitor treatment for MRSA infections.     Radiology Studies: No results found. Scheduled Meds: . amiodarone  200 mg Oral Daily  . apixaban  5 mg Oral BID  . atorvastatin  80 mg Oral QPM  . carvedilol  3.125 mg Oral BID WC  . docusate sodium  100 mg Oral Daily  . gabapentin  100 mg Oral BID  . metolazone  2.5 mg Oral Daily  . nystatin   Topical BID  . potassium chloride  20 mEq Oral Daily  . rOPINIRole  1 mg Oral BID  . senna  1 tablet Oral QHS  . sodium chloride flush  3 mL Intravenous Q12H  . tiotropium  18 mcg Inhalation Daily  . torsemide  60 mg Oral Daily   Continuous Infusions: . sodium chloride       LOS: 6 days   Critical Care Time spent: 31 mins  Irwin Brakeman, MD [facility] Pager [phone]  If 7PM-7AM, please contact night-coverage [email or web address] Password [email or web address] 01/20/2017, 9:20 AM

## 2017-01-21 DIAGNOSIS — I5042 Chronic combined systolic (congestive) and diastolic (congestive) heart failure: Secondary | ICD-10-CM

## 2017-01-21 LAB — RENAL FUNCTION PANEL
ALBUMIN: 3.3 g/dL — AB (ref 3.5–5.0)
Anion gap: 9 (ref 5–15)
BUN: 70 mg/dL — AB (ref 6–20)
CHLORIDE: 85 mmol/L — AB (ref 101–111)
CO2: 45 mmol/L — ABNORMAL HIGH (ref 22–32)
Calcium: 8.4 mg/dL — ABNORMAL LOW (ref 8.9–10.3)
Creatinine, Ser: 2.16 mg/dL — ABNORMAL HIGH (ref 0.61–1.24)
GFR calc Af Amer: 36 mL/min — ABNORMAL LOW (ref >60)
GFR calc non Af Amer: 31 mL/min — ABNORMAL LOW (ref >60)
GLUCOSE: 109 mg/dL — AB (ref 65–99)
POTASSIUM: 3.7 mmol/L (ref 3.5–5.1)
Phosphorus: 3.8 mg/dL (ref 2.5–4.6)
Sodium: 139 mmol/L (ref 135–145)

## 2017-01-21 LAB — BLOOD GAS, ARTERIAL
ACID-BASE EXCESS: 21.2 mmol/L — AB (ref 0.0–2.0)
BICARBONATE: 43.6 mmol/L — AB (ref 20.0–28.0)
Delivery systems: POSITIVE
Drawn by: 22223
EXPIRATORY PAP: 8
FIO2: 45
INSPIRATORY PAP: 18
O2 SAT: 91.7 %
PCO2 ART: 85.1 mmHg — AB (ref 32.0–48.0)
PO2 ART: 67 mmHg — AB (ref 83.0–108.0)
RATE: 12 resp/min
pH, Arterial: 7.368 (ref 7.350–7.450)

## 2017-01-21 LAB — MAGNESIUM: Magnesium: 1.7 mg/dL (ref 1.7–2.4)

## 2017-01-21 MED ORDER — DOCUSATE SODIUM 100 MG PO CAPS
200.0000 mg | ORAL_CAPSULE | Freq: Every day | ORAL | Status: DC
  Administered 2017-01-22 – 2017-01-23 (×2): 200 mg via ORAL
  Filled 2017-01-21 (×2): qty 2

## 2017-01-21 MED ORDER — SENNA 8.6 MG PO TABS
2.0000 | ORAL_TABLET | Freq: Every day | ORAL | Status: DC
  Administered 2017-01-21 – 2017-01-22 (×2): 17.2 mg via ORAL
  Filled 2017-01-21 (×2): qty 2

## 2017-01-21 MED ORDER — POLYETHYLENE GLYCOL 3350 17 G PO PACK
17.0000 g | PACK | Freq: Two times a day (BID) | ORAL | Status: DC
  Administered 2017-01-21: 17 g via ORAL
  Filled 2017-01-21 (×2): qty 1

## 2017-01-21 NOTE — Progress Notes (Signed)
Subjective: He says he feels better.  He has less fluid.  He feels like his BiPAP is helping him although he is now back on high flow nasal cannula with good oxygenation.  He had trouble with restless leg syndrome and he says that is better.  He has some sort of a positive pressure device at his nursing home but he has not been using it regularly.  I suspect this is a CPAP as his nursing home at least as of several months ago was not able to manage BiPAP.  He says he will agree to use what ever his positive pressure device is more regularly now.  His breathing is better.  Objective: Vital signs in last 24 hours: Temp:  [97.6 F (36.4 C)-98.1 F (36.7 C)] 98.1 F (36.7 C) (11/12 0400) Pulse Rate:  [68-77] 68 (11/12 0600) Resp:  [16-32] 21 (11/12 0600) BP: (78-111)/(47-82) 79/59 (11/12 0600) SpO2:  [83 %-99 %] 97 % (11/12 0600) Weight:  [153.9 kg (339 lb 4.6 oz)] 153.9 kg (339 lb 4.6 oz) (11/12 0500) Weight change: -0.5 kg (-1.6 oz) Last BM Date: 01/16/17  Intake/Output from previous day: 11/11 0701 - 11/12 0700 In: 1183 [P.O.:1180; I.V.:3] Out: 1800 [Urine:1800]  PHYSICAL EXAM General appearance: alert, cooperative, no distress and morbidly obese Resp: He still has bilateral rales and he is still somewhat short of breath with conversation Cardio: irregularly irregular rhythm GI: soft, non-tender; bowel sounds normal; no masses,  no organomegaly Extremities: His edema is substantially less Throat is clear.  He still seems mildly confused  Lab Results:  Results for orders placed or performed during the hospital encounter of 01/14/17 (from the past 48 hour(s))  Magnesium     Status: None   Collection Time: 01/20/17  3:43 AM  Result Value Ref Range   Magnesium 1.7 1.7 - 2.4 mg/dL  Renal function panel     Status: Abnormal   Collection Time: 01/20/17  3:43 AM  Result Value Ref Range   Sodium 139 135 - 145 mmol/L   Potassium 4.1 3.5 - 5.1 mmol/L   Chloride 86 (L) 101 - 111 mmol/L    CO2 45 (H) 22 - 32 mmol/L   Glucose, Bld 116 (H) 65 - 99 mg/dL   BUN 71 (H) 6 - 20 mg/dL   Creatinine, Ser 2.31 (H) 0.61 - 1.24 mg/dL   Calcium 8.5 (L) 8.9 - 10.3 mg/dL   Phosphorus 3.9 2.5 - 4.6 mg/dL   Albumin 3.4 (L) 3.5 - 5.0 g/dL   GFR calc non Af Amer 29 (L) >60 mL/min   GFR calc Af Amer 33 (L) >60 mL/min    Comment: (NOTE) The eGFR has been calculated using the CKD EPI equation. This calculation has not been validated in all clinical situations. eGFR's persistently <60 mL/min signify possible Chronic Kidney Disease.    Anion gap 8 5 - 15  CBC     Status: Abnormal   Collection Time: 01/20/17  3:43 AM  Result Value Ref Range   WBC 4.9 4.0 - 10.5 K/uL   RBC 3.43 (L) 4.22 - 5.81 MIL/uL   Hemoglobin 10.3 (L) 13.0 - 17.0 g/dL   HCT 35.5 (L) 39.0 - 52.0 %   MCV 103.5 (H) 78.0 - 100.0 fL   MCH 30.0 26.0 - 34.0 pg   MCHC 29.0 (L) 30.0 - 36.0 g/dL   RDW 17.0 (H) 11.5 - 15.5 %   Platelets 164 150 - 400 K/uL  Blood gas, arterial  Status: Abnormal   Collection Time: 01/20/17  5:30 AM  Result Value Ref Range   FIO2 45.00    Delivery systems BILEVEL POSITIVE AIRWAY PRESSURE    LHR 12 resp/min   Inspiratory PAP 18    Expiratory PAP 8    pH, Arterial 7.344 (L) 7.350 - 7.450   pCO2 arterial 85.4 (HH) 32.0 - 48.0 mmHg    Comment: CRITICAL RESULT CALLED TO, READ BACK BY AND VERIFIED WITH: SHANNON,RN BY K KNICK,RRT,RCP ON 01/20/17 AT 0540    pO2, Arterial 72.5 (L) 83.0 - 108.0 mmHg   Bicarbonate 40.7 (H) 20.0 - 28.0 mmol/L   Acid-Base Excess 18.6 (H) 0.0 - 2.0 mmol/L   O2 Saturation 93.0 %   Collection site RIGHT RADIAL    Drawn by [id number]    Sample type ARTHROGRAPHIS SPECIES    Allens test (pass/fail) PASS PASS  Renal function panel     Status: Abnormal   Collection Time: 01/21/17  4:18 AM  Result Value Ref Range   Sodium 139 135 - 145 mmol/L   Potassium 3.7 3.5 - 5.1 mmol/L   Chloride 85 (L) 101 - 111 mmol/L   CO2 45 (H) 22 - 32 mmol/L   Glucose, Bld 109 (H) 65 - 99  mg/dL   BUN 70 (H) 6 - 20 mg/dL   Creatinine, Ser 2.16 (H) 0.61 - 1.24 mg/dL   Calcium 8.4 (L) 8.9 - 10.3 mg/dL   Phosphorus 3.8 2.5 - 4.6 mg/dL   Albumin 3.3 (L) 3.5 - 5.0 g/dL   GFR calc non Af Amer 31 (L) >60 mL/min   GFR calc Af Amer 36 (L) >60 mL/min    Comment: (NOTE) The eGFR has been calculated using the CKD EPI equation. This calculation has not been validated in all clinical situations. eGFR's persistently <60 mL/min signify possible Chronic Kidney Disease.    Anion gap 9 5 - 15  Magnesium     Status: None   Collection Time: 01/21/17  4:18 AM  Result Value Ref Range   Magnesium 1.7 1.7 - 2.4 mg/dL  Blood gas, arterial     Status: Abnormal   Collection Time: 01/21/17  6:00 AM  Result Value Ref Range   FIO2 45.00    Delivery systems BILEVEL POSITIVE AIRWAY PRESSURE    LHR 12.0 resp/min   Inspiratory PAP 18    Expiratory PAP 8    pH, Arterial 7.368 7.350 - 7.450   pCO2 arterial 85.1 (HH) 32.0 - 48.0 mmHg    Comment: CRITICAL RESULT CALLED TO, READ BACK BY AND VERIFIED WITH: FRED SMITH,RN BY K KNICKRRT,RCP ON 01/21/17 ON 0600    pO2, Arterial 67.0 (L) 83.0 - 108.0 mmHg   Bicarbonate 43.6 (H) 20.0 - 28.0 mmol/L   Acid-Base Excess 21.2 (H) 0.0 - 2.0 mmol/L   O2 Saturation 91.7 %   Collection site RIGHT RADIAL    Drawn by [id number]    Sample type ARTERIAL    Allens test (pass/fail) PASS PASS    ABGS Recent Labs    01/21/17 0600  PHART 7.368  PO2ART 67.0*  HCO3 43.6*   CULTURES Recent Results (from the past 240 hour(s))  MRSA PCR Screening     Status: None   Collection Time: 01/14/17 11:00 PM  Result Value Ref Range Status   MRSA by PCR NEGATIVE NEGATIVE Final    Comment:        The GeneXpert MRSA Assay (FDA approved for NASAL specimens only), is one component of a  comprehensive MRSA colonization surveillance program. It is not intended to diagnose MRSA infection nor to guide or monitor treatment for MRSA infections.    Studies/Results: No results  found.  Medications:  Prior to Admission:  Medications Prior to Admission  Medication Sig Dispense Refill Last Dose  . acetaminophen (TYLENOL) 325 MG tablet Take 650 mg by mouth every 6 (six) hours as needed.   unknown  . amiodarone (PACERONE) 200 MG tablet Take 200 mg by mouth daily.   01/14/2017 at Unknown time  . apixaban (ELIQUIS) 5 MG TABS tablet Take 1 tablet (5 mg total) by mouth 2 (two) times daily. 60 tablet  01/14/2017 at 1000  . atorvastatin (LIPITOR) 80 MG tablet Take 80 mg by mouth every evening.   01/13/2017 at Unknown time  . carvedilol (COREG) 3.125 MG tablet Take 3.125 mg by mouth 2 (two) times daily with a meal.   01/14/2017 at Unknown time  . docusate sodium (COLACE) 100 MG capsule Take 100 mg by mouth daily.   01/14/2017 at Unknown time  . furosemide (LASIX) 20 MG tablet Take 20 mg daily as needed by mouth. Take every 24 hrs for edema   unknown  . furosemide (LASIX) 40 MG tablet Take 40 mg daily by mouth.   01/14/2017 at Unknown time  . gabapentin (NEURONTIN) 100 MG capsule Take 1 capsule (100 mg total) by mouth 2 (two) times daily.   01/14/2017 at Unknown time  . guaiFENesin (ROBITUSSIN) 100 MG/5ML SOLN Take 5 mLs every 8 (eight) hours as needed by mouth for cough or to loosen phlegm.   unknown  . ipratropium-albuterol (DUONEB) 0.5-2.5 (3) MG/3ML SOLN Take 3 mLs every 6 (six) hours as needed by nebulization (shortness of breath).   unknown  . nitroGLYCERIN (NITROSTAT) 0.4 MG SL tablet Place 0.4 mg under the tongue every 5 (five) minutes as needed for chest pain.   unknown  . rOPINIRole (REQUIP) 1 MG tablet Take 1 mg by mouth at bedtime.   01/13/2017 at Unknown time  . senna (SENOKOT) 8.6 MG TABS tablet Take 1 tablet by mouth at bedtime.   01/13/2017 at Unknown time  . tiotropium (SPIRIVA) 18 MCG inhalation capsule Place 18 mcg into inhaler and inhale daily.   01/14/2017 at Unknown time   Scheduled: . amiodarone  200 mg Oral Daily  . apixaban  5 mg Oral BID  . atorvastatin  80 mg  Oral QPM  . carvedilol  3.125 mg Oral BID WC  . docusate sodium  100 mg Oral Daily  . gabapentin  100 mg Oral BID  . metolazone  5 mg Oral Daily  . nystatin   Topical BID  . potassium chloride  20 mEq Oral Daily  . rOPINIRole  1 mg Oral BID  . senna  1 tablet Oral QHS  . sodium chloride flush  3 mL Intravenous Q12H  . tiotropium  18 mcg Inhalation Daily  . torsemide  100 mg Oral Daily   Continuous: . sodium chloride     WIO:MBTDHR chloride, acetaminophen **OR** acetaminophen, bisacodyl, ondansetron **OR** ondansetron (ZOFRAN) IV, sodium chloride flush  Assesment: He was admitted with acute on chronic hypoxic and hypercapnic respiratory failure with acute on chronic combined systolic and diastolic heart failure.  He is now down almost 7 L he has chronic atrial fib which is stable.  He has morbid obesity and I think some obesity hypoventilation/sleep apnea and he has been noncompliant with his positive pressure device.  He still has PCO2 in the  80s so he would be a good candidate for BiPAP if we can arrange that. Active Problems:   Atrial fibrillation (HCC)   Hyperlipidemia   Chronic combined systolic and diastolic heart failure (HCC)   Acute on chronic respiratory failure with hypercapnia (HCC)   Acute on chronic systolic congestive heart failure (HCC)   Morbid obesity due to excess calories ([place])   ICD (implantable cardioverter-defibrillator) in place   Volume overload   Ischemic cardiomyopathy   CKD (chronic kidney disease) stage 3, GFR 30-59 ml/min (HCC)   Acute on chronic combined systolic and diastolic CHF (congestive heart failure) ([place])    Plan: Continue diuresis etc.  Investigate whether he has CPAP or BiPAP at his skilled care facility.    LOS: 7 days   Zaiya Annunziato L 01/21/2017, 7:35 AM

## 2017-01-21 NOTE — Progress Notes (Signed)
PROGRESS NOTE    Darrell Black  KKX:[id number] DOB: 08-May-1955 DOA: 01/14/2017 PCP: Caprice Renshaw, MD   Brief Narrative:  61 year old male with a history of chronic combined systolic and diastolic heart failure, chronic respiratory failure, morbid obesity, admitted to the hospital with worsening volume overload and associated shortness of breath.  Found to be in decompensated CHF and admitted for IV diuresis.  He was started on BiPAP on admission, but had difficulty tolerating this and is currently on nasal cannula.  Assessment & Plan:   Active Problems:   Atrial fibrillation (HCC)   Hyperlipidemia   Chronic combined systolic and diastolic heart failure (HCC)   Acute on chronic respiratory failure with hypercapnia (HCC)   Acute on chronic systolic congestive heart failure (HCC)   Morbid obesity due to excess calories ([place])   ICD (implantable cardioverter-defibrillator) in place   Volume overload   Ischemic cardiomyopathy   CKD (chronic kidney disease) stage 3, GFR 30-59 ml/min (HCC)   Acute on chronic combined systolic and diastolic CHF (congestive heart failure) ([place])   1. Acute on chronic combined systolic and diastolic heart failure.  Ejection fraction of 35-40% per echo done 12/2016.  Patient takes 40 mg of Lasix daily as an outpatient with an extra 20 mg tablet as needed for excess fluid.  He presented with worsening lower extremity edema and shortness of breath.  He was started on Lasix 80 mg IV every 8 hours, but urine output was not good.  He was then started on intravenous Lasix infusion, he is having better urine output and weight slowly trending down.  His weight is down to 339#.  Appreciate nephrology assistance. Pt now on oral demadex and zaroxylyn.   Continue TED hoses and fluid restriction and elevating legs.   2. Acute on chronic respiratory failure with hypercapnia.  Patient has chronic hypercapnic respiratory failure with PCO2 in the 70s.  He does have sleep apnea and likely  obesity hypoventilation.  Reports using oxygen 3-4 L at all times.  Continue bipap as much as he can tolerate during the day and give him breaks for meals but then place him back on bipap. His mental status is much improved when he is on bipap.  His CO2 is slowly trending down as evidenced by the morning ABG.  He did have episode of confusion 11/8 and was markedly hypercarbic, placed on bipap with improvement. I asked for pulmonary consult and the will see him again on 11/12.   3. Chronic kidney disease stage III.  His creatinine 2.1, will continue to watch closely while diuresing. appreciate nephrology consult for assistance with diuresing him.   4. Anemia of CKD - Hg holding stable.  5. Atrial fibrillation.  Currently in sinus rhythm.  Continue on amiodarone.  He is anticoagulated with Eliquis (may need to switch to warfarin in setting of rising creatinine). 6. Hyperlipidemia.  Continue statin  7. Morbid obesity - stable.   8. Obstructive sleep apnea.  Continue nightly Bipap.   DVT prophylaxis: Eliquis Code Status: Full code Family Communication: No family present, nieces reported as next of kin Disposition Plan: Discharge back to skilled nursing facility once improved  Subjective: Pt reports that he feels better.  He has tremors.      Objective: Vitals:   01/21/17 0300 01/21/17 0400 01/21/17 0500 01/21/17 0600  BP: 102/75 95/68 98/65  (!) 79/59  Pulse: 70 70 69 68  Resp: (!) 23 (!) 27 (!) 24 (!) 21  Temp:  98.1 F (36.7 C)  TempSrc:  Axillary    SpO2: 92% 92% 91% 97%  Weight:   (!) 153.9 kg (339 lb 4.6 oz)   Height:        Intake/Output Summary (Last 24 hours) at 01/21/2017 0706 Last data filed at 01/21/2017 0630 Gross per 24 hour  Intake 1183 ml  Output 1800 ml  Net -617 ml   Filed Weights   01/19/17 0500 01/20/17 0400 01/21/17 0500  Weight: (!) 155.3 kg (342 lb 6 oz) (!) 154.4 kg (340 lb 6.2 oz) (!) 153.9 kg (339 lb 4.6 oz)   Examination:  General exam: Morbidly obese  male. calm and comfortable on bipap.    Respiratory system: Diminished breath sounds bilaterally but overall slightly better air movement.    Cardiovascular system: normal S1 & S2 heard.  1+ pedal edema bilateral lower extremities.  Gastrointestinal system: Abdomen is nondistended, soft and nontender. No organomegaly or masses felt. Normal bowel sounds heard. Central nervous system: Alert and oriented. No focal neurological deficits. Extremities: 1+ pitting edema bilateral LEs. Symmetric 5 x 5 power. TED Hoses on.  Skin: chronic venous stasis changes noted in lower extremities bilaterally Psychiatry: Judgement and insight appear normal. Mood & affect appropriate.   Data Reviewed: I have personally reviewed following labs and imaging studies  CBC: Recent Labs  Lab 01/14/17 1436 01/18/17 0404 01/20/17 0343  WBC 5.4 5.8 4.9  HGB 10.4* 10.4* 10.3*  HCT 34.9* 36.3* 35.5*  MCV 100.6* 104.0* 103.5*  PLT 137* 172 160   Basic Metabolic Panel: Recent Labs  Lab 01/17/17 0459 01/18/17 0404 01/19/17 0404 01/20/17 0343 01/21/17 0418  NA 139 139 141  141 139 139  K 3.6 3.9 4.2  4.2 4.1 3.7  CL 86* 85* 89*  89* 86* 85*  CO2 43* 45* 43*  43* 45* 45*  GLUCOSE 106* 101* 96  95 116* 109*  BUN 54* 58* 67*  69* 71* 70*  CREATININE 1.61* 2.01* 2.30*  2.30* 2.31* 2.16*  CALCIUM 8.3* 8.5* 8.4*  8.5* 8.5* 8.4*  MG  --  1.6* 1.7 1.7 1.7  PHOS  --   --  4.6 3.9 3.8   GFR: Estimated Creatinine Clearance: 52.1 mL/min (A) (by C-G formula based on SCr of 2.16 mg/dL (H)). Liver Function Tests: Recent Labs  Lab 01/14/17 1436 01/19/17 0404 01/20/17 0343 01/21/17 0418  AST 20  --   --   --   ALT 18  --   --   --   ALKPHOS 121  --   --   --   BILITOT 1.2  --   --   --   PROT 6.8  --   --   --   ALBUMIN 3.6 3.2* 3.4* 3.3*   No results for input(s): LIPASE, AMYLASE in the last 168 hours. No results for input(s): AMMONIA in the last 168 hours. Coagulation Profile: No results for  input(s): INR, PROTIME in the last 168 hours. Cardiac Enzymes: Recent Labs  Lab 01/14/17 1436  TROPONINI <0.03   BNP (last 3 results) No results for input(s): PROBNP in the last 8760 hours. HbA1C: No results for input(s): HGBA1C in the last 72 hours. CBG: No results for input(s): GLUCAP in the last 168 hours. Lipid Profile: No results for input(s): CHOL, HDL, LDLCALC, TRIG, CHOLHDL, LDLDIRECT in the last 72 hours. Thyroid Function Tests: No results for input(s): TSH, T4TOTAL, FREET4, T3FREE, THYROIDAB in the last 72 hours. Anemia Panel: No results for input(s): VITAMINB12, FOLATE, FERRITIN, TIBC, IRON, RETICCTPCT  in the last 72 hours. Sepsis Labs: No results for input(s): PROCALCITON, LATICACIDVEN in the last 168 hours.  Recent Results (from the past 240 hour(s))  MRSA PCR Screening     Status: None   Collection Time: 01/14/17 11:00 PM  Result Value Ref Range Status   MRSA by PCR NEGATIVE NEGATIVE Final    Comment:        The GeneXpert MRSA Assay (FDA approved for NASAL specimens only), is one component of a comprehensive MRSA colonization surveillance program. It is not intended to diagnose MRSA infection nor to guide or monitor treatment for MRSA infections.     Radiology Studies: No results found. Scheduled Meds: . amiodarone  200 mg Oral Daily  . apixaban  5 mg Oral BID  . atorvastatin  80 mg Oral QPM  . carvedilol  3.125 mg Oral BID WC  . docusate sodium  100 mg Oral Daily  . gabapentin  100 mg Oral BID  . metolazone  5 mg Oral Daily  . nystatin   Topical BID  . potassium chloride  20 mEq Oral Daily  . rOPINIRole  1 mg Oral BID  . senna  1 tablet Oral QHS  . sodium chloride flush  3 mL Intravenous Q12H  . tiotropium  18 mcg Inhalation Daily  . torsemide  100 mg Oral Daily   Continuous Infusions: . sodium chloride       LOS: 7 days   Critical Care Time spent: 32 mins  Irwin Brakeman, MD [facility] Pager [phone]  If 7PM-7AM,  please contact night-coverage [email or web address] Password [email or web address] 01/21/2017, 7:06 AM

## 2017-01-21 NOTE — Progress Notes (Signed)
Pacemaker/ICD checked by Francella Solian. [employer] ([employer]) technician and found to be operating properly.  New pacemaker information card ordered.

## 2017-01-21 NOTE — Progress Notes (Signed)
Darrell Black  MRN: [id number]  DOB/AGE: Aug 05, 1955 61 y.o.  Primary Care Physician:[name], [name], MD  Admit date: 01/14/2017  Chief Complaint:  Chief Complaint  Patient presents with  . Shortness of Breath    S-Pt presented on  01/14/2017 with  Chief Complaint  Patient presents with  . Shortness of Breath  .    Pt today feels better. Pt main concern is " What is my creat? Pt later says " It was as high as 6 when I was at [facility] in [place] and I need 1 dialysis "   Meds . amiodarone  200 mg Oral Daily  . apixaban  5 mg Oral BID  . atorvastatin  80 mg Oral QPM  . carvedilol  3.125 mg Oral BID WC  . docusate sodium  100 mg Oral Daily  . gabapentin  100 mg Oral BID  . metolazone  5 mg Oral Daily  . nystatin   Topical BID  . potassium chloride  20 mEq Oral Daily  . rOPINIRole  1 mg Oral BID  . senna  1 tablet Oral QHS  . sodium chloride flush  3 mL Intravenous Q12H  . tiotropium  18 mcg Inhalation Daily  . torsemide  100 mg Oral Daily         Physical Exam: Vital signs in last 24 hours: Temp:  [97.6 F (36.4 C)-98.1 F (36.7 C)] 97.6 F (36.4 C) (11/12 0800) Pulse Rate:  [68-77] 70 (11/12 0901) Resp:  [16-32] 21 (11/12 0600) BP: (78-109)/(47-76) 109/76 (11/12 0901) SpO2:  [83 %-99 %] 97 % (11/12 0600) Weight:  [339 lb 4.6 oz (153.9 kg)] 339 lb 4.6 oz (153.9 kg) (11/12 0500) Weight change: -1.6 oz (-0.5 kg) Last BM Date: 01/16/17  Intake/Output from previous day: 11/11 0701 - 11/12 0700 In: 1183 [P.O.:1180; I.V.:3] Out: 1800 [Urine:1800] No intake/output data recorded.   Physical Exam: General- pt is awake,alert, oriented to time place and person Resp- No acute REsp distress, Rhonchi+ CVS- S1S2 regular in rate and rhythm GIT- BS+, soft, NT, ND EXT- 2+ LE Edema,no  Cyanosis,chronic venous stasis changes   Lab Results: CBC Recent Labs    01/20/17 0343  WBC 4.9  HGB 10.3*  HCT 35.5*  PLT 164    BMET Recent Labs    01/20/17 0343  01/21/17 0418  NA 139 139  K 4.1 3.7  CL 86* 85*  CO2 45* 45*  GLUCOSE 116* 109*  BUN 71* 70*  CREATININE 2.31* 2.16*  CALCIUM 8.5* 8.4*    Creat trend 2018  1.6==> 2.3=>2.1 In October admission 1.3--3.3    MICRO Recent Results (from the past 240 hour(s))  MRSA PCR Screening     Status: None   Collection Time: 01/14/17 11:00 PM  Result Value Ref Range Status   MRSA by PCR NEGATIVE NEGATIVE Final    Comment:        The GeneXpert MRSA Assay (FDA approved for NASAL specimens only), is one component of a comprehensive MRSA colonization surveillance program. It is not intended to diagnose MRSA infection nor to guide or monitor treatment for MRSA infections.       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                Nephrolithiasis Hx Absent   2)HTN  Medication- On Diuretics On Alpha and beta Blockers   3)Anemia HGb at goal (9--11)   4)CKD Mineral-Bone Disorder PTH not avail . Secondary Hyperparathyroidism w/u pending. Phosphorus at goal. Calcium when corrected for low albumin is at goal.  5)CHF-admitted with CHF Pt on diuretics Pt negative by  7 liters Primary MD following  6)Electrolytes  Normokalemic NOrmonatremic   7)Acid base Admitted with acute resp acidosis + Chronic resp acidosis + Metabolic alkalosis PH 5.63=>8.937 Now better   8) Resp-hx of sleep apnea   Pt says " I was not using my machine for past 10 years, I just started  using it"    Plan:  Will continue current care Will try to get data from previous admission at [place]. Pt recently moved to [place] S 01/21/2017, 9:02 AM

## 2017-01-22 LAB — COMPREHENSIVE METABOLIC PANEL
ALBUMIN: 3.3 g/dL — AB (ref 3.5–5.0)
ALT: 14 U/L — ABNORMAL LOW (ref 17–63)
ANION GAP: 8 (ref 5–15)
AST: 16 U/L (ref 15–41)
Alkaline Phosphatase: 113 U/L (ref 38–126)
BUN: 72 mg/dL — AB (ref 6–20)
CHLORIDE: 85 mmol/L — AB (ref 101–111)
CO2: 49 mmol/L — AB (ref 22–32)
Calcium: 8.6 mg/dL — ABNORMAL LOW (ref 8.9–10.3)
Creatinine, Ser: 1.97 mg/dL — ABNORMAL HIGH (ref 0.61–1.24)
GFR calc Af Amer: 40 mL/min — ABNORMAL LOW (ref >60)
GFR calc non Af Amer: 35 mL/min — ABNORMAL LOW (ref >60)
GLUCOSE: 100 mg/dL — AB (ref 65–99)
POTASSIUM: 3.3 mmol/L — AB (ref 3.5–5.1)
SODIUM: 142 mmol/L (ref 135–145)
Total Bilirubin: 1.8 mg/dL — ABNORMAL HIGH (ref 0.3–1.2)
Total Protein: 6.3 g/dL — ABNORMAL LOW (ref 6.5–8.1)

## 2017-01-22 LAB — MAGNESIUM: MAGNESIUM: 1.7 mg/dL (ref 1.7–2.4)

## 2017-01-22 MED ORDER — IPRATROPIUM-ALBUTEROL 0.5-2.5 (3) MG/3ML IN SOLN
3.0000 mL | RESPIRATORY_TRACT | Status: DC | PRN
  Administered 2017-01-22 – 2017-01-23 (×3): 3 mL via RESPIRATORY_TRACT
  Filled 2017-01-22 (×3): qty 3

## 2017-01-22 MED ORDER — POTASSIUM CHLORIDE CRYS ER 20 MEQ PO TBCR
40.0000 meq | EXTENDED_RELEASE_TABLET | Freq: Two times a day (BID) | ORAL | Status: DC
  Administered 2017-01-22 – 2017-01-23 (×3): 40 meq via ORAL
  Filled 2017-01-22 (×3): qty 2

## 2017-01-22 MED ORDER — POTASSIUM CHLORIDE CRYS ER 20 MEQ PO TBCR: 40.0000 meq | EXTENDED_RELEASE_TABLET | Freq: Every day | ORAL | Status: DC

## 2017-01-22 NOTE — Progress Notes (Signed)
PROGRESS NOTE    Darrell Black  DVV:[id number] DOB: Nov 16, 1955 DOA: 01/14/2017 PCP: Caprice Renshaw, MD   Brief Narrative:  61 year old male with a history of chronic combined systolic and diastolic heart failure, chronic respiratory failure, morbid obesity, admitted to the hospital with worsening volume overload and associated shortness of breath.  Found to be in decompensated CHF and admitted for IV diuresis.  He was started on BiPAP on admission, but had difficulty tolerating this and is currently on nasal cannula.  Assessment & Plan:   Active Problems:   Atrial fibrillation (HCC)   Hyperlipidemia   Chronic combined systolic and diastolic heart failure (HCC)   Acute on chronic respiratory failure with hypercapnia (HCC)   Acute on chronic systolic congestive heart failure (HCC)   Morbid obesity due to excess calories ([place])   ICD (implantable cardioverter-defibrillator) in place   Volume overload   Ischemic cardiomyopathy   CKD (chronic kidney disease) stage 3, GFR 30-59 ml/min (HCC)   Acute on chronic combined systolic and diastolic CHF (congestive heart failure) ([place])   1. Acute on chronic combined systolic and diastolic heart failure.  Ejection fraction of 35-40% per echo done 12/2016.  Patient takes 40 mg of Lasix daily as an outpatient with an extra 20 mg tablet as needed for excess fluid.  He presented with worsening lower extremity edema and shortness of breath.  He was started on Lasix 80 mg IV every 8 hours, but urine output was poor.  He was then started on intravenous Lasix infusion with good results.  He is now on oral diuretics.  His weight is down to 339#.  Appreciate nephrology assistance. Pt now on oral demadex and zaroxylyn.   Continue TED hoses and fluid restriction and elevating legs.   2. Acute on chronic respiratory failure with hypercapnia.  Patient has chronic hypercapnic respiratory failure with PCO2 in the 70s.  He does have sleep apnea and likely obesity  hypoventilation.  Reports using oxygen 3-4 L at all times.  Continue bipap as much as he can tolerate during the day and give him breaks for meals but then place him back on bipap. His mental status is much improved when he is on bipap.  His CO2 is slowly trending down.  He did have episode of severe confusion 11/8 and was markedly hypercarbic, placed on bipap with improvement. I asked for pulmonary consult. Adding nebulizers.    3. Chronic kidney disease stage III.  His creatinine is around 2, will continue to watch closely with nephrology. appreciate nephrology consult for assistance with diuresing him.   4. Anemia of CKD - Hg holding stable.  5. Atrial fibrillation.  Currently in sinus rhythm.  Continue on amiodarone.  He is anticoagulated with Eliquis (may need to switch to warfarin in setting of rising creatinine). 6. Hyperlipidemia.  Continue statin  7. Morbid obesity - stable.   8. Obstructive sleep apnea.  Continue nightly Bipap.  9. Hypokalemia - replace and recheck in AM.    DVT prophylaxis: Eliquis Code Status: Full code Family Communication: No family present, nieces reported as next of kin Disposition Plan: Hopefully can discharge to SNF in next couple of days  Subjective: Pt was seen off bipap this morning, he is requesting a nebulizer treatment, says he uses them at home quite often      Objective: Vitals:   01/22/17 0259 01/22/17 0400 01/22/17 0500 01/22/17 0600  BP:  98/83 101/83 113/64  Pulse: 70 70 70 70  Resp: (!) 21 (!)  24 20 (!) 24  Temp:      TempSrc:      SpO2: 97% 94% 98% 97%  Weight:      Height:        Intake/Output Summary (Last 24 hours) at 01/22/2017 0711 Last data filed at 01/21/2017 2100 Gross per 24 hour  Intake 1093 ml  Output 1225 ml  Net -132 ml   Filed Weights   01/19/17 0500 01/20/17 0400 01/21/17 0500  Weight: (!) 155.3 kg (342 lb 6 oz) (!) 154.4 kg (340 lb 6.2 oz) (!) 153.9 kg (339 lb 4.6 oz)   Examination:  General exam: Morbidly  obese male. calm and comfortable on bipap.    Respiratory system: Diminished breath sounds bilaterally but overall slightly better air movement.    Cardiovascular system: normal S1 & S2 heard.  1+ pedal edema bilateral lower extremities.  Gastrointestinal system: Abdomen is nondistended, soft and nontender. No organomegaly or masses felt. Normal bowel sounds heard. Central nervous system: Alert and oriented. No focal neurological deficits. Extremities: 1+ pitting edema bilateral LEs. Symmetric 5 x 5 power. TED Hoses on.  Skin: chronic venous stasis changes noted in lower extremities bilaterally Psychiatry: Judgement and insight appear normal. Mood & affect appropriate.   Data Reviewed: I have personally reviewed following labs and imaging studies  CBC: Recent Labs  Lab 01/18/17 0404 01/20/17 0343  WBC 5.8 4.9  HGB 10.4* 10.3*  HCT 36.3* 35.5*  MCV 104.0* 103.5*  PLT 172 458   Basic Metabolic Panel: Recent Labs  Lab 01/18/17 0404 01/19/17 0404 01/20/17 0343 01/21/17 0418 01/22/17 0406  NA 139 141  141 139 139 142  K 3.9 4.2  4.2 4.1 3.7 3.3*  CL 85* 89*  89* 86* 85* 85*  CO2 45* 43*  43* 45* 45* 49*  GLUCOSE 101* 96  95 116* 109* 100*  BUN 58* 67*  69* 71* 70* 72*  CREATININE 2.01* 2.30*  2.30* 2.31* 2.16* 1.97*  CALCIUM 8.5* 8.4*  8.5* 8.5* 8.4* 8.6*  MG 1.6* 1.7 1.7 1.7 1.7  PHOS  --  4.6 3.9 3.8  --    GFR: Estimated Creatinine Clearance: 57.1 mL/min (A) (by C-G formula based on SCr of 1.97 mg/dL (H)). Liver Function Tests: Recent Labs  Lab 01/19/17 0404 01/20/17 0343 01/21/17 0418 01/22/17 0406  AST  --   --   --  16  ALT  --   --   --  14*  ALKPHOS  --   --   --  113  BILITOT  --   --   --  1.8*  PROT  --   --   --  6.3*  ALBUMIN 3.2* 3.4* 3.3* 3.3*   No results for input(s): LIPASE, AMYLASE in the last 168 hours. No results for input(s): AMMONIA in the last 168 hours. Coagulation Profile: No results for input(s): INR, PROTIME in the last 168  hours. Cardiac Enzymes: No results for input(s): CKTOTAL, CKMB, CKMBINDEX, TROPONINI in the last 168 hours. BNP (last 3 results) No results for input(s): PROBNP in the last 8760 hours. HbA1C: No results for input(s): HGBA1C in the last 72 hours. CBG: No results for input(s): GLUCAP in the last 168 hours. Lipid Profile: No results for input(s): CHOL, HDL, LDLCALC, TRIG, CHOLHDL, LDLDIRECT in the last 72 hours. Thyroid Function Tests: No results for input(s): TSH, T4TOTAL, FREET4, T3FREE, THYROIDAB in the last 72 hours. Anemia Panel: No results for input(s): VITAMINB12, FOLATE, FERRITIN, TIBC, IRON, RETICCTPCT in the  last 72 hours. Sepsis Labs: No results for input(s): PROCALCITON, LATICACIDVEN in the last 168 hours.  Recent Results (from the past 240 hour(s))  MRSA PCR Screening     Status: None   Collection Time: 01/14/17 11:00 PM  Result Value Ref Range Status   MRSA by PCR NEGATIVE NEGATIVE Final    Comment:        The GeneXpert MRSA Assay (FDA approved for NASAL specimens only), is one component of a comprehensive MRSA colonization surveillance program. It is not intended to diagnose MRSA infection nor to guide or monitor treatment for MRSA infections.     Radiology Studies: No results found. Scheduled Meds: . amiodarone  200 mg Oral Daily  . apixaban  5 mg Oral BID  . atorvastatin  80 mg Oral QPM  . carvedilol  3.125 mg Oral BID WC  . docusate sodium  200 mg Oral Daily  . gabapentin  100 mg Oral BID  . metolazone  5 mg Oral Daily  . nystatin   Topical BID  . polyethylene glycol  17 g Oral BID  . potassium chloride  20 mEq Oral Daily  . rOPINIRole  1 mg Oral BID  . senna  2 tablet Oral QHS  . sodium chloride flush  3 mL Intravenous Q12H  . tiotropium  18 mcg Inhalation Daily  . torsemide  100 mg Oral Daily   Continuous Infusions: . sodium chloride       LOS: 8 days   Critical Care Time spent: 31 mins  Irwin Brakeman, MD [facility] Pager  [phone]  If 7PM-7AM, please contact night-coverage [email or web address] Password [email or web address] 01/22/2017, 7:11 AM

## 2017-01-22 NOTE — Progress Notes (Signed)
Subjective: He says he feels fairly well.  He is however having some shortness of breath/chest tightness this morning and is requesting a nebulizer treatment. he says he has those occasionally as an outpatient.  Otherwise he is much better.  He used BiPAP essentially all night last night.  He finds this very helpful.  Objective: Vital signs in last 24 hours: Temp:  [97.3 F (36.3 C)-97.8 F (36.6 C)] 97.4 F (36.3 C) (11/13 0400) Pulse Rate:  [37-70] 70 (11/13 0600) Resp:  [17-26] 24 (11/13 0600) BP: (89-113)/(55-83) 113/64 (11/13 0600) SpO2:  [90 %-100 %] 100 % (11/13 0804) Weight:  [152 kg (335 lb 1.6 oz)] 152 kg (335 lb 1.6 oz) (11/13 0500) Weight change: -1.9 kg (-3 oz) Last BM Date: 01/21/17  Intake/Output from previous day: 11/12 0701 - 11/13 0700 In: 1093 [P.O.:1090; I.V.:3] Out: 2800 [Urine:2800]  PHYSICAL EXAM General appearance: alert, cooperative, mild distress and morbidly obese Resp: He still has some rales but he is overall Cardio: regular rate and rhythm, S1, S2 normal, no murmur, click, rub or gallop GI: soft, non-tender; bowel sounds normal; no masses,  no organomegaly Extremities: His edema continues to improve Throat is clear.  Lab Results:  Results for orders placed or performed during the hospital encounter of 01/14/17 (from the past 48 hour(s))  Renal function panel     Status: Abnormal   Collection Time: 01/21/17  4:18 AM  Result Value Ref Range   Sodium 139 135 - 145 mmol/L   Potassium 3.7 3.5 - 5.1 mmol/L   Chloride 85 (L) 101 - 111 mmol/L   CO2 45 (H) 22 - 32 mmol/L   Glucose, Bld 109 (H) 65 - 99 mg/dL   BUN 70 (H) 6 - 20 mg/dL   Creatinine, Ser 2.16 (H) 0.61 - 1.24 mg/dL   Calcium 8.4 (L) 8.9 - 10.3 mg/dL   Phosphorus 3.8 2.5 - 4.6 mg/dL   Albumin 3.3 (L) 3.5 - 5.0 g/dL   GFR calc non Af Amer 31 (L) >60 mL/min   GFR calc Af Amer 36 (L) >60 mL/min    Comment: (NOTE) The eGFR has been calculated using the CKD EPI equation. This calculation  has not been validated in all clinical situations. eGFR's persistently <60 mL/min signify possible Chronic Kidney Disease.    Anion gap 9 5 - 15  Magnesium     Status: None   Collection Time: 01/21/17  4:18 AM  Result Value Ref Range   Magnesium 1.7 1.7 - 2.4 mg/dL  Blood gas, arterial     Status: Abnormal   Collection Time: 01/21/17  6:00 AM  Result Value Ref Range   FIO2 45.00    Delivery systems BILEVEL POSITIVE AIRWAY PRESSURE    LHR 12.0 resp/min   Inspiratory PAP 18    Expiratory PAP 8    pH, Arterial 7.368 7.350 - 7.450   pCO2 arterial 85.1 (HH) 32.0 - 48.0 mmHg    Comment: CRITICAL RESULT CALLED TO, READ BACK BY AND VERIFIED WITH: FRED SMITH,RN BY K KNICKRRT,RCP ON 01/21/17 ON 0600    pO2, Arterial 67.0 (L) 83.0 - 108.0 mmHg   Bicarbonate 43.6 (H) 20.0 - 28.0 mmol/L   Acid-Base Excess 21.2 (H) 0.0 - 2.0 mmol/L   O2 Saturation 91.7 %   Collection site RIGHT RADIAL    Drawn by [id number]    Sample type ARTERIAL    Allens test (pass/fail) PASS PASS  Magnesium     Status: None   Collection Time:  01/22/17  4:06 AM  Result Value Ref Range   Magnesium 1.7 1.7 - 2.4 mg/dL  Comprehensive metabolic panel     Status: Abnormal   Collection Time: 01/22/17  4:06 AM  Result Value Ref Range   Sodium 142 135 - 145 mmol/L   Potassium 3.3 (L) 3.5 - 5.1 mmol/L   Chloride 85 (L) 101 - 111 mmol/L   CO2 49 (H) 22 - 32 mmol/L   Glucose, Bld 100 (H) 65 - 99 mg/dL   BUN 72 (H) 6 - 20 mg/dL   Creatinine, Ser 1.97 (H) 0.61 - 1.24 mg/dL   Calcium 8.6 (L) 8.9 - 10.3 mg/dL   Total Protein 6.3 (L) 6.5 - 8.1 g/dL   Albumin 3.3 (L) 3.5 - 5.0 g/dL   AST 16 15 - 41 U/L   ALT 14 (L) 17 - 63 U/L   Alkaline Phosphatase 113 38 - 126 U/L   Total Bilirubin 1.8 (H) 0.3 - 1.2 mg/dL   GFR calc non Af Amer 35 (L) >60 mL/min   GFR calc Af Amer 40 (L) >60 mL/min    Comment: (NOTE) The eGFR has been calculated using the CKD EPI equation. This calculation has not been validated in all clinical  situations. eGFR's persistently <60 mL/min signify possible Chronic Kidney Disease.    Anion gap 8 5 - 15    ABGS Recent Labs    01/21/17 0600  PHART 7.368  PO2ART 67.0*  HCO3 43.6*   CULTURES Recent Results (from the past 240 hour(s))  MRSA PCR Screening     Status: None   Collection Time: 01/14/17 11:00 PM  Result Value Ref Range Status   MRSA by PCR NEGATIVE NEGATIVE Final    Comment:        The GeneXpert MRSA Assay (FDA approved for NASAL specimens only), is one component of a comprehensive MRSA colonization surveillance program. It is not intended to diagnose MRSA infection nor to guide or monitor treatment for MRSA infections.    Studies/Results: No results found.  Medications:  Prior to Admission:  Medications Prior to Admission  Medication Sig Dispense Refill Last Dose  . acetaminophen (TYLENOL) 325 MG tablet Take 650 mg by mouth every 6 (six) hours as needed.   unknown  . amiodarone (PACERONE) 200 MG tablet Take 200 mg by mouth daily.   01/14/2017 at Unknown time  . apixaban (ELIQUIS) 5 MG TABS tablet Take 1 tablet (5 mg total) by mouth 2 (two) times daily. 60 tablet  01/14/2017 at 1000  . atorvastatin (LIPITOR) 80 MG tablet Take 80 mg by mouth every evening.   01/13/2017 at Unknown time  . carvedilol (COREG) 3.125 MG tablet Take 3.125 mg by mouth 2 (two) times daily with a meal.   01/14/2017 at Unknown time  . docusate sodium (COLACE) 100 MG capsule Take 100 mg by mouth daily.   01/14/2017 at Unknown time  . furosemide (LASIX) 20 MG tablet Take 20 mg daily as needed by mouth. Take every 24 hrs for edema   unknown  . furosemide (LASIX) 40 MG tablet Take 40 mg daily by mouth.   01/14/2017 at Unknown time  . gabapentin (NEURONTIN) 100 MG capsule Take 1 capsule (100 mg total) by mouth 2 (two) times daily.   01/14/2017 at Unknown time  . guaiFENesin (ROBITUSSIN) 100 MG/5ML SOLN Take 5 mLs every 8 (eight) hours as needed by mouth for cough or to loosen phlegm.   unknown   . ipratropium-albuterol (DUONEB) 0.5-2.5 (3)  MG/3ML SOLN Take 3 mLs every 6 (six) hours as needed by nebulization (shortness of breath).   unknown  . nitroGLYCERIN (NITROSTAT) 0.4 MG SL tablet Place 0.4 mg under the tongue every 5 (five) minutes as needed for chest pain.   unknown  . rOPINIRole (REQUIP) 1 MG tablet Take 1 mg by mouth at bedtime.   01/13/2017 at Unknown time  . senna (SENOKOT) 8.6 MG TABS tablet Take 1 tablet by mouth at bedtime.   01/13/2017 at Unknown time  . tiotropium (SPIRIVA) 18 MCG inhalation capsule Place 18 mcg into inhaler and inhale daily.   01/14/2017 at Unknown time   Scheduled: . amiodarone  200 mg Oral Daily  . apixaban  5 mg Oral BID  . atorvastatin  80 mg Oral QPM  . carvedilol  3.125 mg Oral BID WC  . docusate sodium  200 mg Oral Daily  . gabapentin  100 mg Oral BID  . metolazone  5 mg Oral Daily  . nystatin   Topical BID  . polyethylene glycol  17 g Oral BID  . potassium chloride  40 mEq Oral Daily  . rOPINIRole  1 mg Oral BID  . senna  2 tablet Oral QHS  . sodium chloride flush  3 mL Intravenous Q12H  . tiotropium  18 mcg Inhalation Daily  . torsemide  100 mg Oral Daily   Continuous: . sodium chloride     TCY:ELYHTM chloride, acetaminophen **OR** acetaminophen, bisacodyl, ipratropium-albuterol, ondansetron **OR** ondansetron (ZOFRAN) IV, sodium chloride flush  Assesment: He was admitted with acute on chronic hypoxic and hypercapnic respiratory failure which is mostly due to acute on chronic combined systolic and diastolic heart failure.  He is improved.  He is down almost 10 L.  He is down about 7 kg.  His breathing is better.  He does not have a diagnosis listed of COPD but he is on Spiriva and now has some chest tightness and will receive it.  I think he may have some element of COPD in addition to his cardiac issues.  He says he uses BiPAP at the nursing home but I am going to investigate because they have not been able to provide BiPAP in the past.   They have however had a recent change in ownership and may be able to provide that now. Active Problems:   Atrial fibrillation (HCC)   Hyperlipidemia   Chronic combined systolic and diastolic heart failure (HCC)   Acute on chronic respiratory failure with hypercapnia (HCC)   Acute on chronic systolic congestive heart failure (HCC)   Morbid obesity due to excess calories ([place])   ICD (implantable cardioverter-defibrillator) in place   Volume overload   Ischemic cardiomyopathy   CKD (chronic kidney disease) stage 3, GFR 30-59 ml/min (HCC)   Acute on chronic combined systolic and diastolic CHF (congestive heart failure) ([place])    Plan: Continue current treatments.  Overall he is much improved    LOS: 8 days   Seleste Tallman L 01/22/2017, 8:07 AM

## 2017-01-22 NOTE — Progress Notes (Signed)
Subjective: Interval History: Patient claims he is feeling much better.  He slept very well.  Denies any nausea or vomiting.  Objective: Vital signs in last 24 hours: Temp:  [97.3 F (36.3 C)-97.8 F (36.6 C)] 97.4 F (36.3 C) (11/13 0400) Pulse Rate:  [37-70] 70 (11/13 0600) Resp:  [17-26] 24 (11/13 0600) BP: (89-113)/(55-83) 113/64 (11/13 0600) SpO2:  [90 %-100 %] 100 % (11/13 0804) Weight:  [152 kg (335 lb 1.6 oz)] 152 kg (335 lb 1.6 oz) (11/13 0500) Weight change: -1.9 kg (-3 oz)  Intake/Output from previous day: 11/12 0701 - 11/13 0700 In: 1093 [P.O.:1090; I.V.:3] Out: 2800 [Urine:2800] Intake/Output this shift: No intake/output data recorded.  General appearance: alert, cooperative and no distress Resp: diminished breath sounds bilaterally and rales posterior - bilateral Cardio: irregularly irregular rhythm Extremities: edema 1+ edema bilaterally  Lab Results: Recent Labs    01/20/17 0343  WBC 4.9  HGB 10.3*  HCT 35.5*  PLT 164   BMET:  Recent Labs    01/21/17 0418 01/22/17 0406  NA 139 142  K 3.7 3.3*  CL 85* 85*  CO2 45* 49*  GLUCOSE 109* 100*  BUN 70* 72*  CREATININE 2.16* 1.97*  CALCIUM 8.4* 8.6*   No results for input(Black): PTH in the last 72 hours. Iron Studies: No results for input(Black): IRON, TIBC, TRANSFERRIN, FERRITIN in the last 72 hours.  Studies/Results: No results found.  I have reviewed the patient'Black current medications.  Assessment/Plan: 3] chronic combined systolic and diastolic heart failure: Presently he is on Demadex and metolazone.  He has 2800 cc of urine output and patient is feeling much better. 2] renal failure: Acute on chronic chronic.  Possibly secondary cardiorenal.  His renal function is improving.  His creatinine is still above his baseline. 3]morbid obesity 4]sleep apnea: Patient on CPAP 5] bone and mineral disorder: His calcium and phosphorus is range 6] diabetes: His blood sugar is reasonably controlled 7] restless  leg syndrome 8] hypokalemia: Most likely from diuretics. Plan: 1]We will continue with diuretics at the present dose 2] will increase potassium to 40 mEq p.o. twice daily 3] we will check his renal panel in the morning  LOS: 8 days   Darrell Black 01/22/2017,8:08 AM

## 2017-01-22 NOTE — Clinical Social Work Note (Signed)
Late entry for 01/21/17  LCSW provided status update for Darrell Black at [facility].     Zollie Ellery, Clydene Pugh, LCSW

## 2017-01-22 NOTE — Progress Notes (Signed)
OT Cancellation Note  Patient Details Name: Darrell Black MRN: [id number] DOB: 1955/05/14   Cancelled Treatment:    Reason Eval/Treat Not Completed: Medical issues which prohibited therapy(low BP recorded at 800).  Occupational therapy will attempt evaluation again on 01/23/17. Vangie Bicker, [place], OTR/L [phone]    01/22/2017, 9:59 AM

## 2017-01-23 LAB — COMPREHENSIVE METABOLIC PANEL
ALK PHOS: 121 U/L (ref 38–126)
ALT: 15 U/L — AB (ref 17–63)
AST: 18 U/L (ref 15–41)
Albumin: 3.3 g/dL — ABNORMAL LOW (ref 3.5–5.0)
Anion gap: 8 (ref 5–15)
BUN: 70 mg/dL — AB (ref 6–20)
CALCIUM: 8.5 mg/dL — AB (ref 8.9–10.3)
CHLORIDE: 83 mmol/L — AB (ref 101–111)
CO2: 50 mmol/L — AB (ref 22–32)
CREATININE: 2.02 mg/dL — AB (ref 0.61–1.24)
GFR calc non Af Amer: 34 mL/min — ABNORMAL LOW (ref >60)
GFR, EST AFRICAN AMERICAN: 39 mL/min — AB (ref >60)
GLUCOSE: 104 mg/dL — AB (ref 65–99)
Potassium: 3.4 mmol/L — ABNORMAL LOW (ref 3.5–5.1)
SODIUM: 141 mmol/L (ref 135–145)
Total Bilirubin: 1.3 mg/dL — ABNORMAL HIGH (ref 0.3–1.2)
Total Protein: 6.3 g/dL — ABNORMAL LOW (ref 6.5–8.1)

## 2017-01-23 LAB — MAGNESIUM: MAGNESIUM: 1.6 mg/dL — AB (ref 1.7–2.4)

## 2017-01-23 LAB — PHOSPHORUS: Phosphorus: 3.2 mg/dL (ref 2.5–4.6)

## 2017-01-23 MED ORDER — METOLAZONE 5 MG PO TABS: 5.0000 mg | ORAL_TABLET | ORAL | Status: AC

## 2017-01-23 MED ORDER — TORSEMIDE 100 MG PO TABS: 100.0000 mg | ORAL_TABLET | Freq: Every day | ORAL | Status: AC

## 2017-01-23 NOTE — Progress Notes (Signed)
Darrell Black  MRN: [id number]  DOB/AGE: 1956-01-11 61 y.o.  Primary Care Physician:[name], [name], MD  Admit date: 01/14/2017  Chief Complaint:  Chief Complaint  Patient presents with  . Shortness of Breath    S-Pt presented on  01/14/2017 with  Chief Complaint  Patient presents with  . Shortness of Breath  .    Pt today feels better.        Meds . amiodarone  200 mg Oral Daily  . apixaban  5 mg Oral BID  . atorvastatin  80 mg Oral QPM  . carvedilol  3.125 mg Oral BID WC  . docusate sodium  200 mg Oral Daily  . gabapentin  100 mg Oral BID  . metolazone  5 mg Oral Daily  . nystatin   Topical BID  . polyethylene glycol  17 g Oral BID  . potassium chloride  40 mEq Oral BID  . rOPINIRole  1 mg Oral BID  . senna  2 tablet Oral QHS  . sodium chloride flush  3 mL Intravenous Q12H  . tiotropium  18 mcg Inhalation Daily  . torsemide  100 mg Oral Daily         Physical Exam: Vital signs in last 24 hours: Temp:  [97.1 F (36.2 C)-98.2 F (36.8 C)] 97.6 F (36.4 C) (11/14 0400) Pulse Rate:  [46-75] 71 (11/14 0800) Resp:  [11-28] 24 (11/14 0800) BP: (70-111)/(30-73) 95/64 (11/14 0800) SpO2:  [82 %-100 %] 95 % (11/14 0820) Weight:  [332 lb 14.3 oz (151 kg)] 332 lb 14.3 oz (151 kg) (11/14 0500) Weight change: -3.3 oz (-1 kg) Last BM Date: 01/21/17  Intake/Output from previous day: 11/13 0701 - 11/14 0700 In: -  Out: 2075 [Urine:2075] Total I/O In: -  Out: 600 [Urine:600]   Physical Exam: General- pt is awake,alert, oriented to time place and person Resp- No acute REsp distress, Rhonchi+ CVS- S1S2 regular in rate and rhythm GIT- BS+, soft, NT, ND EXT- 2+ LE Edema,no  Cyanosis,chronic venous stasis changes   Lab Results:  HGb  10.3   BMET Recent Labs    01/22/17 0406 01/23/17 0353  NA 142 141  K 3.3* 3.4*  CL 85* 83*  CO2 49* 50*  GLUCOSE 100* 104*  BUN 72* 70*  CREATININE 1.97* 2.02*  CALCIUM 8.6* 8.5*    Creat trend 2018  1.6==>  2.3=>2.1=> 2.0 In October admission 1.3--3.3    MICRO Recent Results (from the past 240 hour(s))  MRSA PCR Screening     Status: None   Collection Time: 01/14/17 11:00 PM  Result Value Ref Range Status   MRSA by PCR NEGATIVE NEGATIVE Final    Comment:        The GeneXpert MRSA Assay (FDA approved for NASAL specimens only), is one component of a comprehensive MRSA colonization surveillance program. It is not intended to diagnose MRSA infection nor to guide or monitor treatment for MRSA infections.       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                Nephrolithiasis Hx Absent   2)HTN  Medication- On Diuretics On Alpha and beta Blockers   3)Anemia HGb at goal (9--11)   4)CKD Mineral-Bone Disorder PTH not avail . Secondary Hyperparathyroidism w/u pending. Phosphorus at goal. Calcium when corrected for low albumin is at goal.  5)CHF-admitted with CHF Pt on diuretics Pt negative by  11 liters since admission Primary MD following  6)Electrolytes  Normokalemic NOrmonatremic   7)Acid base Admitted with acute resp acidosis + Chronic resp acidosis + Metabolic alkalosis PH 7.41=>2.878 Now better   8) Resp-hx of sleep apnea   Pt says " I was not using my machine for past 10 years, I just started using it"    Plan:  Will continue current care Will try to get data from previous admission at [place]. Pt recently moved to [place] 01/23/2017, 9:22 AM

## 2017-01-23 NOTE — Discharge Summary (Signed)
Physician Discharge Summary  Darrell Black VEL:[id number] DOB: Apr 09, 1955 DOA: 01/14/2017  PCP: Caprice Renshaw, MD  Admit date: 01/14/2017 Discharge date: 01/23/2017  Time spent: 45 minutes  Recommendations for Outpatient Follow-up:  -To be discharged back to SNF today. -We will continue to use BiPAP at bedtime.  Discharge Diagnoses:  Active Problems:   Atrial fibrillation (HCC)   Hyperlipidemia   Chronic combined systolic and diastolic heart failure (HCC)   Acute on chronic respiratory failure with hypercapnia (HCC)   Acute on chronic systolic congestive heart failure (HCC)   Morbid obesity due to excess calories ([place])   ICD (implantable cardioverter-defibrillator) in place   Volume overload   Ischemic cardiomyopathy   CKD (chronic kidney disease) stage 3, GFR 30-59 ml/min (HCC)   Acute on chronic combined systolic and diastolic CHF (congestive heart failure) ([place])   Discharge Condition: Stable and improved  Filed Weights   01/21/17 0500 01/22/17 0500 01/23/17 0500  Weight: (!) 153.9 kg (339 lb 4.6 oz) (!) 152 kg (335 lb 1.6 oz) (!) 151 kg (332 lb 14.3 oz)    History of present illness:  As per Dr. Jonnie Finner on 11/5: Darrell Black is a 61 y.o. male with hx of afib, CAD, hx CABG/ subsequent stents, combined s/d CHF, HL, DM, HTN, ICM, depression, morbid obesity, OSA , AICD in place and CKD went for his cardiology f/u today and they sent him to ED for severe LE edema w/ dyspnea for evaluation. Exam showed anasarca and CXR showed vasc congestion, small bilat effusions and bibasilar possilbe edema with low lung volume.  BP's are soft, 100% SpO2 on 3L [place].  RR 18, not in distress. We are asked to see for admission.    Patient falling asleep during interview.  Reports increasing LE edema and SOB w orthopnea over the last 2-4 wks.  Says he was supposed to switch over from lasix to bumex pills this week, because "they work better for me".  Has had several episodes of this before per pt.   Had CABG in 2001 and then stents placed in 2006 and then again in 2017.  ICD ST Jude placed in 2006 with generator in 2014.   Has afib . On home oxygen recently started. He was seen by cardiology during last admission and they noted he was in afib, not certain though is chornic/ parox/ persistent.  It chronic, amio could be d'c and HR controlled with coreg or toprol-XL.   He lived in [place], then in [place] , [place], then moved to [place] to live with a niece earlier this year.        Hospital Course:   1. Acute on chronic combined systolic and diastolic heart failure.  Ejection fraction of 35-40% per echo done 12/2016.  Was initially on Lasix drip and subsequently transitioned to torsemide 100 mg daily and Zaroxolyn 5 mg daily and has had good diuresis of over 10 L.  Okay for discharge back to SNF today.  Discussed with nephrology will make Zaroxolyn every other day and continue current dose of torsemide. 2. Acute on chronic respiratory failure with hypercapnia.  Patient has chronic hypercapnic respiratory failure with PCO2 in the 70s.  He does have sleep apnea and likely obesity hypoventilation.  Reports using oxygen 3-4 L at all times.  Continue bipap as much as he can tolerate during the day and give him breaks for meals but then place him back on bipap. His mental status is much improved when he is on  bipap.  His CO2 is slowly trending down.  He did have episode of severe confusion 11/8 and was markedly hypercarbic, placed on bipap with improvement.  Was seen by pulmonary this admission.  Patient has become much more comfortable with BiPAP use and states that he will continue to use it SNF. 3. Chronic kidney disease stage III.  His creatinine is around 2 remains at baseline at time of discharge despite aggressive diuresis.  4. Anemia of CKD - Hg holding stable.  5. Atrial fibrillation.  Currently in sinus rhythm.  Continue on amiodarone.  He is anticoagulated with Eliquis  6. Hyperlipidemia.  Continue statin   7. Morbid obesity - stable.   8. Obstructive sleep apnea.  Continue nightly Bipap.        Consultations:  Pulmonary  Nephrology  Discharge Instructions   Allergies as of 01/23/2017      Reactions   Ampicillin Rash   Clindamycin/lincomycin Rash   Penicillins Rash   Has patient had a PCN reaction causing immediate rash, facial/tongue/throat swelling, SOB or lightheadedness with hypotension: Yes Has patient had a PCN reaction causing severe rash involving mucus membranes or skin necrosis: No Has patient had a PCN reaction that required hospitalization: No Has patient had a PCN reaction occurring within the last 10 years: No If all of the above answers are "NO", then may proceed with Cephalosporin use.      Medication List    STOP taking these medications   furosemide 20 MG tablet Commonly known as:  LASIX   furosemide 40 MG tablet Commonly known as:  LASIX     TAKE these medications   acetaminophen 325 MG tablet Commonly known as:  TYLENOL Take 650 mg by mouth every 6 (six) hours as needed.   amiodarone 200 MG tablet Commonly known as:  PACERONE Take 200 mg by mouth daily.   apixaban 5 MG Tabs tablet Commonly known as:  ELIQUIS Take 1 tablet (5 mg total) by mouth 2 (two) times daily.   atorvastatin 80 MG tablet Commonly known as:  LIPITOR Take 80 mg by mouth every evening.   carvedilol 3.125 MG tablet Commonly known as:  COREG Take 3.125 mg by mouth 2 (two) times daily with a meal.   docusate sodium 100 MG capsule Commonly known as:  COLACE Take 100 mg by mouth daily.   gabapentin 100 MG capsule Commonly known as:  NEURONTIN Take 1 capsule (100 mg total) by mouth 2 (two) times daily.   guaiFENesin 100 MG/5ML Soln Commonly known as:  ROBITUSSIN Take 5 mLs every 8 (eight) hours as needed by mouth for cough or to loosen phlegm.   ipratropium-albuterol 0.5-2.5 (3) MG/3ML Soln Commonly known as:  DUONEB Take 3 mLs every 6 (six) hours as needed by  nebulization (shortness of breath).   metolazone 5 MG tablet Commonly known as:  ZAROXOLYN Take 1 tablet (5 mg total) every other day by mouth.   nitroGLYCERIN 0.4 MG SL tablet Commonly known as:  NITROSTAT Place 0.4 mg under the tongue every 5 (five) minutes as needed for chest pain.   rOPINIRole 1 MG tablet Commonly known as:  REQUIP Take 1 mg by mouth at bedtime.   senna 8.6 MG Tabs tablet Commonly known as:  SENOKOT Take 1 tablet by mouth at bedtime.   tiotropium 18 MCG inhalation capsule Commonly known as:  SPIRIVA Place 18 mcg into inhaler and inhale daily.   torsemide 100 MG tablet Commonly known as:  DEMADEX Take 1 tablet (  100 mg total) daily by mouth. Start taking on:  01/24/2017      Allergies  Allergen Reactions  . Ampicillin Rash  . Clindamycin/Lincomycin Rash  . Penicillins Rash    Has patient had a PCN reaction causing immediate rash, facial/tongue/throat swelling, SOB or lightheadedness with hypotension: Yes Has patient had a PCN reaction causing severe rash involving mucus membranes or skin necrosis: No Has patient had a PCN reaction that required hospitalization: No Has patient had a PCN reaction occurring within the last 10 years: No If all of the above answers are "NO", then may proceed with Cephalosporin use.       The results of significant diagnostics from this hospitalization (including imaging, microbiology, ancillary and laboratory) are listed below for reference.    Significant Diagnostic Studies: Dg Chest 2 View  Result Date: 01/14/2017 CLINICAL DATA:  Shortness of Breath EXAM: CHEST  2 VIEW COMPARISON:  December 24, 2016 FINDINGS: There are small pleural effusions bilaterally. There is patchy atelectasis in the mid lower lung zones bilaterally. There is trace interstitial edema. There is cardiomegaly with pulmonary venous hypertension. Pacemaker leads are attached to the right atrium and right ventricle. Patient is status post median  sternotomy. No adenopathy. There is degenerative change in the lower thoracic spine. IMPRESSION: Cardiomegaly with pulmonary venous hypertension consistent with a degree of pulmonary vascular congestion. Small pleural effusions and trace bibasilar edema noted. There is patchy atelectasis in both mid and lower lung zones. Pacemaker leads attached to right atrium and right ventricle. Electronically Signed   By: Lowella Grip III M.D.   On: 01/14/2017 14:18   US Renal  Result Date: 12/25/2016 CLINICAL DATA:  61 year old male with acute renal failure EXAM: RENAL / URINARY TRACT ULTRASOUND COMPLETE COMPARISON:  None. FINDINGS: Right Kidney: Length: 11.2 cm. Echogenicity within normal limits. No mass or hydronephrosis visualized. Left Kidney: Length: 10.6 cm. Echogenicity within normal limits. No mass or hydronephrosis visualized. Bladder: The bladder was decompressed. IMPRESSION: Normal renal ultrasound. Electronically Signed   By: Jacqulynn Cadet M.D.   On: 12/25/2016 11:47   Dg Chest Port 1 View  Result Date: 01/17/2017 CLINICAL DATA:  61 year old male admitted 3 days ago with shortness of Breath, decompensated CHF. EXAM: PORTABLE CHEST 1 VIEW COMPARISON:  01/16/2017 and earlier. FINDINGS: Portable AP upright view at 0504 hours. Lordotic view. Stable cardiomegaly and mediastinal contours. Stable left chest cardiac AICD. Since 01/14/2017 pulmonary vascularity has not significantly changed. Basilar ventilation has slightly improved. No pneumothorax or large effusion. Negative visible bowel gas pattern. IMPRESSION: Minimal improved ventilation since 01/14/2017. Continued interstitial edema. Electronically Signed   By: Genevie Ann M.D.   On: 01/17/2017 06:42   Dg Chest Port 1 View  Result Date: 01/16/2017 CLINICAL DATA:  Congestive heart failure EXAM: PORTABLE CHEST 1 VIEW COMPARISON:  January 14, 2017 FINDINGS: There is cardiomegaly with pulmonary venous hypertension. Pacemaker leads are attached to the  right atrium and right ventricle. There is a small right pleural effusion. There is patchy atelectasis in both lung bases. There is mild interstitial edema. There is focal airspace opacity in the right mid lung, new from 2 days prior. Patient is status post coronary artery bypass grafting. No bone lesions. IMPRESSION: Stable pulmonary vascular congestion. Small right pleural effusion. New airspace opacity in the right mid lung, potentially representing alveolar edema but concerning for focus of pneumonia. Mild interstitial edema persists. Areas of atelectatic change in the bases remain. Electronically Signed   By: Lowella Grip III M.D.  On: 01/16/2017 09:48   Dg Chest Port 1 View  Result Date: 12/24/2016 CLINICAL DATA:  Dyspnea EXAM: PORTABLE CHEST 1 VIEW COMPARISON:  Chest radiograph 12/17/2016 FINDINGS: Left chest wall AICD leads are unchanged in position. Sequelae of median sternotomy. Unchanged cardiomegaly. Small right pleural effusion with associated atelectasis, unchanged. No new region of consolidation. Unchanged left basilar atelectasis. Persistent pulmonary vascular congestion. IMPRESSION: Unchanged small right pleural effusion and associated atelectasis. Pulmonary vascular congestion and left basilar atelectasis without overt pulmonary edema. Electronically Signed   By: Ulyses Jarred M.D.   On: 12/24/2016 14:52    Microbiology: Recent Results (from the past 240 hour(s))  MRSA PCR Screening     Status: None   Collection Time: 01/14/17 11:00 PM  Result Value Ref Range Status   MRSA by PCR NEGATIVE NEGATIVE Final    Comment:        The GeneXpert MRSA Assay (FDA approved for NASAL specimens only), is one component of a comprehensive MRSA colonization surveillance program. It is not intended to diagnose MRSA infection nor to guide or monitor treatment for MRSA infections.      Labs: Basic Metabolic Panel: Recent Labs  Lab 01/19/17 0404 01/20/17 0343 01/21/17 0418  01/22/17 0406 01/23/17 0353  NA 141  141 139 139 142 141  K 4.2  4.2 4.1 3.7 3.3* 3.4*  CL 89*  89* 86* 85* 85* 83*  CO2 43*  43* 45* 45* 49* 50*  GLUCOSE 96  95 116* 109* 100* 104*  BUN 67*  69* 71* 70* 72* 70*  CREATININE 2.30*  2.30* 2.31* 2.16* 1.97* 2.02*  CALCIUM 8.4*  8.5* 8.5* 8.4* 8.6* 8.5*  MG 1.7 1.7 1.7 1.7 1.6*  PHOS 4.6 3.9 3.8  --  3.2   Liver Function Tests: Recent Labs  Lab 01/19/17 0404 01/20/17 0343 01/21/17 0418 01/22/17 0406 01/23/17 0353  AST  --   --   --  16 18  ALT  --   --   --  14* 15*  ALKPHOS  --   --   --  113 121  BILITOT  --   --   --  1.8* 1.3*  PROT  --   --   --  6.3* 6.3*  ALBUMIN 3.2* 3.4* 3.3* 3.3* 3.3*   No results for input(s): LIPASE, AMYLASE in the last 168 hours. No results for input(s): AMMONIA in the last 168 hours. CBC: Recent Labs  Lab 01/18/17 0404 01/20/17 0343  WBC 5.8 4.9  HGB 10.4* 10.3*  HCT 36.3* 35.5*  MCV 104.0* 103.5*  PLT 172 164   Cardiac Enzymes: No results for input(s): CKTOTAL, CKMB, CKMBINDEX, TROPONINI in the last 168 hours. BNP: BNP (last 3 results) Recent Labs    12/11/16 2330 01/14/17 1436  BNP 583.0* 687.0*    ProBNP (last 3 results) No results for input(s): PROBNP in the last 8760 hours.  CBG: No results for input(s): GLUCAP in the last 168 hours.     SignedLelon Frohlich  [facility] Pager: [phone] 01/23/2017, 12:06 PM

## 2017-01-23 NOTE — Progress Notes (Signed)
Pt requested to take BIPAP off

## 2017-01-23 NOTE — Clinical Social Work Note (Signed)
LCSW sent discharge clinicals to facility and notified Debbie at [facility] of patient's discharge. LCSW left a message for patient's niece, Aldona Bar, advising of patient's discharge.   LCSW signing off.   Gianella Chismar, Clydene Pugh, LCSW

## 2017-01-23 NOTE — Evaluation (Signed)
Occupational Therapy Evaluation Patient Details Name: Darrell Black MRN: [id number] DOB: [birth year] Today's Date: 01/23/2017    History of Present Illness Darrell Black is a 61 y.o. male with hx of afib, CAD, hx CABG/ subsequent stents, combined s/d CHF, HL, DM, HTN, ICM, depression, morbid obesity, OSA , AICD in place and CKD went for his cardiology f/u today and they sent him to ED for severe LE edema w/ dyspnea for evaluation. Exam showed anasarca and CXR showed vasc congestion, small bilat effusions and bibasilar possilbe edema with low lung volume.  BP's are soft, 100% SpO2 on 3L [place].  RR 18, not in distress.    Clinical Impression   Pt received in bed, agreeable to evaluation. Evaluation completed at bed level. Pt requiring increased level of assistance for all B/ADL completion due to weakness. Pt reports he was progressing nicely at [facility] and has been receiving good therapy since transferring to [facility]. Pt reports he has been completing stand-pivot transfers to [facility] while at hospital and this is all he feels able to tolerate right now. At this time pt is planning to discharge back to [facility]. Recommend resumption of rehab services to improve safety and independence in ADL completion and functional mobility tasks. No further acute care OT needs.     Follow Up Recommendations  SNF(Return to [facility])    Equipment Recommendations  None recommended by OT       Precautions / Restrictions Precautions Precautions: Fall Precaution Comments: limited mobility PTA Restrictions Weight Bearing Restrictions: No      Mobility Bed Mobility               General bed mobility comments: Not tested  Transfers                 General transfer comment: Not tested        ADL either performed or assessed with clinical judgement   ADL Overall ADL's : Needs assistance/impaired Eating/Feeding: Modified independent;Bed level   Grooming: Set up;Sitting;Bed level   Upper Body  Bathing: Moderate assistance;Sitting Upper Body Bathing Details (indicate cue type and reason): Assist for back and difficult to reach areas Lower Body Bathing: Total assistance;Bed level   Upper Body Dressing : Minimal assistance;Sitting;Bed level   Lower Body Dressing: Maximal assistance;Bed level                       Vision Baseline Vision/History: Wears glasses Wears Glasses: At all times Patient Visual Report: No change from baseline Vision Assessment?: No apparent visual deficits            Pertinent Vitals/Pain Pain Assessment: No/denies pain     Hand Dominance Right   Extremity/Trunk Assessment Upper Extremity Assessment Upper Extremity Assessment: Generalized weakness(RUE weaker than LUE)       Cervical / Trunk Assessment Cervical / Trunk Assessment: Normal   Communication Communication Communication: No difficulties   Cognition Arousal/Alertness: Awake/alert Behavior During Therapy: WFL for tasks assessed/performed;Anxious Overall Cognitive Status: Within Functional Limits for tasks assessed                                                Home Living Family/patient expects to be discharged to:: Skilled nursing facility  Additional Comments: Pt from [facility] for STR      Prior Functioning/Environment Level of Independence: Needs assistance  Gait / Transfers Assistance Needed: Pt reports transfering using walker from surface to surface, wheelchair mostly for mobility since being transferred to [facility]. Reports he was walking 150 feet at [place] [facility] [employer] Assistance Needed: Pt receiving assistance for all B/IADL tasks            OT Problem List: Decreased strength;Decreased activity tolerance;Impaired balance (sitting and/or standing);Decreased safety awareness;Cardiopulmonary status limiting activity       End of Session    Activity Tolerance: Patient tolerated  treatment well Patient left: in bed;with call bell/phone within reach;with bed alarm set  OT Visit Diagnosis: Muscle weakness (generalized) (M62.81)                Time: 4917-9150 OT Time Calculation (min): 13 min Charges:  OT General Charges $OT Visit: 1 Visit OT Evaluation $OT Eval Low Complexity: 1 Low    [name], OTR/L  [phone] 01/23/2017, 9:11 AM

## 2017-01-23 NOTE — Progress Notes (Signed)
Subjective: He says he feels better.  He is using his BiPAP about 6-8 hours and he thinks is helping significantly.  He is still having diuresis.  He says his breathing feels much better.  No new complaints.  Objective: Vital signs in last 24 hours: Temp:  [97.1 F (36.2 C)-98.2 F (36.8 C)] 97.6 F (36.4 C) (11/14 0400) Pulse Rate:  [46-75] 71 (11/14 0800) Resp:  [11-28] 24 (11/14 0800) BP: (70-111)/(30-73) 95/64 (11/14 0800) SpO2:  [82 %-100 %] 95 % (11/14 0820) Weight:  [151 kg (332 lb 14.3 oz)] 151 kg (332 lb 14.3 oz) (11/14 0500) Weight change: -1 kg (-3.3 oz) Last BM Date: 01/21/17  Intake/Output from previous day: 11/13 0701 - 11/14 0700 In: -  Out: 2075 [Urine:2075]  PHYSICAL EXAM General appearance: alert, cooperative and morbidly obese Resp: His lungs are essentially clear now Cardio: Appears to be in paced rhythm no gallop GI: soft, non-tender; bowel sounds normal; no masses,  no organomegaly Extremities: His edema is substantially better He is mildly anxious  Lab Results:  Results for orders placed or performed during the hospital encounter of 01/14/17 (from the past 48 hour(s))  Magnesium     Status: None   Collection Time: 01/22/17  4:06 AM  Result Value Ref Range   Magnesium 1.7 1.7 - 2.4 mg/dL  Comprehensive metabolic panel     Status: Abnormal   Collection Time: 01/22/17  4:06 AM  Result Value Ref Range   Sodium 142 135 - 145 mmol/L   Potassium 3.3 (L) 3.5 - 5.1 mmol/L   Chloride 85 (L) 101 - 111 mmol/L   CO2 49 (H) 22 - 32 mmol/L   Glucose, Bld 100 (H) 65 - 99 mg/dL   BUN 72 (H) 6 - 20 mg/dL   Creatinine, Ser 1.97 (H) 0.61 - 1.24 mg/dL   Calcium 8.6 (L) 8.9 - 10.3 mg/dL   Total Protein 6.3 (L) 6.5 - 8.1 g/dL   Albumin 3.3 (L) 3.5 - 5.0 g/dL   AST 16 15 - 41 U/L   ALT 14 (L) 17 - 63 U/L   Alkaline Phosphatase 113 38 - 126 U/L   Total Bilirubin 1.8 (H) 0.3 - 1.2 mg/dL   GFR calc non Af Amer 35 (L) >60 mL/min   GFR calc Af Amer 40 (L) >60 mL/min     Comment: (NOTE) The eGFR has been calculated using the CKD EPI equation. This calculation has not been validated in all clinical situations. eGFR's persistently <60 mL/min signify possible Chronic Kidney Disease.    Anion gap 8 5 - 15  Magnesium     Status: Abnormal   Collection Time: 01/23/17  3:53 AM  Result Value Ref Range   Magnesium 1.6 (L) 1.7 - 2.4 mg/dL  Comprehensive metabolic panel     Status: Abnormal   Collection Time: 01/23/17  3:53 AM  Result Value Ref Range   Sodium 141 135 - 145 mmol/L   Potassium 3.4 (L) 3.5 - 5.1 mmol/L   Chloride 83 (L) 101 - 111 mmol/L   CO2 50 (H) 22 - 32 mmol/L   Glucose, Bld 104 (H) 65 - 99 mg/dL   BUN 70 (H) 6 - 20 mg/dL   Creatinine, Ser 2.02 (H) 0.61 - 1.24 mg/dL   Calcium 8.5 (L) 8.9 - 10.3 mg/dL   Total Protein 6.3 (L) 6.5 - 8.1 g/dL   Albumin 3.3 (L) 3.5 - 5.0 g/dL   AST 18 15 - 41 U/L   ALT  15 (L) 17 - 63 U/L   Alkaline Phosphatase 121 38 - 126 U/L   Total Bilirubin 1.3 (H) 0.3 - 1.2 mg/dL   GFR calc non Af Amer 34 (L) >60 mL/min   GFR calc Af Amer 39 (L) >60 mL/min    Comment: (NOTE) The eGFR has been calculated using the CKD EPI equation. This calculation has not been validated in all clinical situations. eGFR's persistently <60 mL/min signify possible Chronic Kidney Disease.    Anion gap 8 5 - 15  Phosphorus     Status: None   Collection Time: 01/23/17  3:53 AM  Result Value Ref Range   Phosphorus 3.2 2.5 - 4.6 mg/dL    ABGS Recent Labs    01/21/17 0600  PHART 7.368  PO2ART 67.0*  HCO3 43.6*   CULTURES Recent Results (from the past 240 hour(s))  MRSA PCR Screening     Status: None   Collection Time: 01/14/17 11:00 PM  Result Value Ref Range Status   MRSA by PCR NEGATIVE NEGATIVE Final    Comment:        The GeneXpert MRSA Assay (FDA approved for NASAL specimens only), is one component of a comprehensive MRSA colonization surveillance program. It is not intended to diagnose MRSA infection nor to guide  or monitor treatment for MRSA infections.    Studies/Results: No results found.  Medications:  Prior to Admission:  Medications Prior to Admission  Medication Sig Dispense Refill Last Dose  . acetaminophen (TYLENOL) 325 MG tablet Take 650 mg by mouth every 6 (six) hours as needed.   unknown  . amiodarone (PACERONE) 200 MG tablet Take 200 mg by mouth daily.   01/14/2017 at Unknown time  . apixaban (ELIQUIS) 5 MG TABS tablet Take 1 tablet (5 mg total) by mouth 2 (two) times daily. 60 tablet  01/14/2017 at 1000  . atorvastatin (LIPITOR) 80 MG tablet Take 80 mg by mouth every evening.   01/13/2017 at Unknown time  . carvedilol (COREG) 3.125 MG tablet Take 3.125 mg by mouth 2 (two) times daily with a meal.   01/14/2017 at Unknown time  . docusate sodium (COLACE) 100 MG capsule Take 100 mg by mouth daily.   01/14/2017 at Unknown time  . furosemide (LASIX) 20 MG tablet Take 20 mg daily as needed by mouth. Take every 24 hrs for edema   unknown  . furosemide (LASIX) 40 MG tablet Take 40 mg daily by mouth.   01/14/2017 at Unknown time  . gabapentin (NEURONTIN) 100 MG capsule Take 1 capsule (100 mg total) by mouth 2 (two) times daily.   01/14/2017 at Unknown time  . guaiFENesin (ROBITUSSIN) 100 MG/5ML SOLN Take 5 mLs every 8 (eight) hours as needed by mouth for cough or to loosen phlegm.   unknown  . ipratropium-albuterol (DUONEB) 0.5-2.5 (3) MG/3ML SOLN Take 3 mLs every 6 (six) hours as needed by nebulization (shortness of breath).   unknown  . nitroGLYCERIN (NITROSTAT) 0.4 MG SL tablet Place 0.4 mg under the tongue every 5 (five) minutes as needed for chest pain.   unknown  . rOPINIRole (REQUIP) 1 MG tablet Take 1 mg by mouth at bedtime.   01/13/2017 at Unknown time  . senna (SENOKOT) 8.6 MG TABS tablet Take 1 tablet by mouth at bedtime.   01/13/2017 at Unknown time  . tiotropium (SPIRIVA) 18 MCG inhalation capsule Place 18 mcg into inhaler and inhale daily.   01/14/2017 at Unknown time   Scheduled: .  amiodarone  200 mg Oral Daily  . apixaban  5 mg Oral BID  . atorvastatin  80 mg Oral QPM  . carvedilol  3.125 mg Oral BID WC  . docusate sodium  200 mg Oral Daily  . gabapentin  100 mg Oral BID  . metolazone  5 mg Oral Daily  . nystatin   Topical BID  . polyethylene glycol  17 g Oral BID  . potassium chloride  40 mEq Oral BID  . rOPINIRole  1 mg Oral BID  . senna  2 tablet Oral QHS  . sodium chloride flush  3 mL Intravenous Q12H  . tiotropium  18 mcg Inhalation Daily  . torsemide  100 mg Oral Daily   Continuous: . sodium chloride     WPV:XYIAXK chloride, acetaminophen **OR** acetaminophen, bisacodyl, ipratropium-albuterol, ondansetron **OR** ondansetron (ZOFRAN) IV, sodium chloride flush  Assesment: He was admitted with acute on chronic hypoxic and hypercapnic respiratory failure mostly on the basis of acute on chronic combined systolic and diastolic heart failure.  He has chronic kidney disease which complicates the situation.  He has diuresed substantially over 9 L.  His breathing is much better.  At baseline he has obstructive sleep apnea and at his skilled care facility he has been noncompliant with BiPAP.  However he has learned to tolerate BiPAP now and is enthusiastic about using it when he goes back to his skilled care facility.  He is still requiring high flow oxygen at around 8 L/min. Active Problems:   Atrial fibrillation (HCC)   Hyperlipidemia   Chronic combined systolic and diastolic heart failure (HCC)   Acute on chronic respiratory failure with hypercapnia (HCC)   Acute on chronic systolic congestive heart failure (HCC)   Morbid obesity due to excess calories ([place])   ICD (implantable cardioverter-defibrillator) in place   Volume overload   Ischemic cardiomyopathy   CKD (chronic kidney disease) stage 3, GFR 30-59 ml/min (HCC)   Acute on chronic combined systolic and diastolic CHF (congestive heart failure) ([place])    Plan: Continue current treatments.  He is improving  and agrees to wear BiPAP when he goes back to his skilled care facility    LOS: 9 days   Lylliana Kitamura L 01/23/2017, 8:38 AM

## 2017-01-28 ENCOUNTER — Inpatient Hospital Stay (HOSPITAL_COMMUNITY)
Admission: EM | Admit: 2017-01-28 | Discharge: 2017-02-09 | DRG: 291 | Disposition: E | Payer: Medicaid Other | Attending: Pulmonary Disease | Admitting: Pulmonary Disease

## 2017-01-28 ENCOUNTER — Encounter (HOSPITAL_COMMUNITY): Payer: Self-pay

## 2017-01-28 ENCOUNTER — Emergency Department (HOSPITAL_COMMUNITY): Payer: Medicaid Other

## 2017-01-28 DIAGNOSIS — G2581 Restless legs syndrome: Secondary | ICD-10-CM | POA: Diagnosis present

## 2017-01-28 DIAGNOSIS — Z978 Presence of other specified devices: Secondary | ICD-10-CM

## 2017-01-28 DIAGNOSIS — G4733 Obstructive sleep apnea (adult) (pediatric): Secondary | ICD-10-CM | POA: Diagnosis not present

## 2017-01-28 DIAGNOSIS — Z9861 Coronary angioplasty status: Secondary | ICD-10-CM

## 2017-01-28 DIAGNOSIS — I361 Nonrheumatic tricuspid (valve) insufficiency: Secondary | ICD-10-CM | POA: Diagnosis not present

## 2017-01-28 DIAGNOSIS — I4901 Ventricular fibrillation: Secondary | ICD-10-CM | POA: Diagnosis not present

## 2017-01-28 DIAGNOSIS — Z7189 Other specified counseling: Secondary | ICD-10-CM

## 2017-01-28 DIAGNOSIS — J9601 Acute respiratory failure with hypoxia: Secondary | ICD-10-CM

## 2017-01-28 DIAGNOSIS — E1142 Type 2 diabetes mellitus with diabetic polyneuropathy: Secondary | ICD-10-CM | POA: Diagnosis present

## 2017-01-28 DIAGNOSIS — Z91013 Allergy to seafood: Secondary | ICD-10-CM

## 2017-01-28 DIAGNOSIS — E872 Acidosis: Secondary | ICD-10-CM | POA: Diagnosis present

## 2017-01-28 DIAGNOSIS — N172 Acute kidney failure with medullary necrosis: Secondary | ICD-10-CM | POA: Diagnosis not present

## 2017-01-28 DIAGNOSIS — J810 Acute pulmonary edema: Secondary | ICD-10-CM | POA: Diagnosis not present

## 2017-01-28 DIAGNOSIS — Z95810 Presence of automatic (implantable) cardiac defibrillator: Secondary | ICD-10-CM

## 2017-01-28 DIAGNOSIS — Z7901 Long term (current) use of anticoagulants: Secondary | ICD-10-CM

## 2017-01-28 DIAGNOSIS — N183 Chronic kidney disease, stage 3 unspecified: Secondary | ICD-10-CM | POA: Diagnosis present

## 2017-01-28 DIAGNOSIS — Z6841 Body Mass Index (BMI) 40.0 and over, adult: Secondary | ICD-10-CM

## 2017-01-28 DIAGNOSIS — I2510 Atherosclerotic heart disease of native coronary artery without angina pectoris: Secondary | ICD-10-CM | POA: Diagnosis present

## 2017-01-28 DIAGNOSIS — R6521 Severe sepsis with septic shock: Secondary | ICD-10-CM | POA: Diagnosis not present

## 2017-01-28 DIAGNOSIS — K279 Peptic ulcer, site unspecified, unspecified as acute or chronic, without hemorrhage or perforation: Secondary | ICD-10-CM | POA: Diagnosis present

## 2017-01-28 DIAGNOSIS — R0603 Acute respiratory distress: Secondary | ICD-10-CM

## 2017-01-28 DIAGNOSIS — Z9889 Other specified postprocedural states: Secondary | ICD-10-CM

## 2017-01-28 DIAGNOSIS — Z8249 Family history of ischemic heart disease and other diseases of the circulatory system: Secondary | ICD-10-CM

## 2017-01-28 DIAGNOSIS — I130 Hypertensive heart and chronic kidney disease with heart failure and stage 1 through stage 4 chronic kidney disease, or unspecified chronic kidney disease: Principal | ICD-10-CM | POA: Diagnosis present

## 2017-01-28 DIAGNOSIS — E1122 Type 2 diabetes mellitus with diabetic chronic kidney disease: Secondary | ICD-10-CM | POA: Diagnosis present

## 2017-01-28 DIAGNOSIS — D631 Anemia in chronic kidney disease: Secondary | ICD-10-CM | POA: Diagnosis present

## 2017-01-28 DIAGNOSIS — I472 Ventricular tachycardia: Secondary | ICD-10-CM | POA: Diagnosis not present

## 2017-01-28 DIAGNOSIS — I255 Ischemic cardiomyopathy: Secondary | ICD-10-CM | POA: Diagnosis present

## 2017-01-28 DIAGNOSIS — R0902 Hypoxemia: Secondary | ICD-10-CM

## 2017-01-28 DIAGNOSIS — I5043 Acute on chronic combined systolic (congestive) and diastolic (congestive) heart failure: Secondary | ICD-10-CM | POA: Diagnosis not present

## 2017-01-28 DIAGNOSIS — N179 Acute kidney failure, unspecified: Secondary | ICD-10-CM | POA: Diagnosis not present

## 2017-01-28 DIAGNOSIS — J9622 Acute and chronic respiratory failure with hypercapnia: Secondary | ICD-10-CM | POA: Diagnosis not present

## 2017-01-28 DIAGNOSIS — I480 Paroxysmal atrial fibrillation: Secondary | ICD-10-CM | POA: Diagnosis present

## 2017-01-28 DIAGNOSIS — R570 Cardiogenic shock: Secondary | ICD-10-CM

## 2017-01-28 DIAGNOSIS — Z9119 Patient's noncompliance with other medical treatment and regimen: Secondary | ICD-10-CM

## 2017-01-28 DIAGNOSIS — E662 Morbid (severe) obesity with alveolar hypoventilation: Secondary | ICD-10-CM | POA: Diagnosis present

## 2017-01-28 DIAGNOSIS — Z9911 Dependence on respirator [ventilator] status: Secondary | ICD-10-CM

## 2017-01-28 DIAGNOSIS — J9690 Respiratory failure, unspecified, unspecified whether with hypoxia or hypercapnia: Secondary | ICD-10-CM

## 2017-01-28 DIAGNOSIS — Z789 Other specified health status: Secondary | ICD-10-CM | POA: Diagnosis not present

## 2017-01-28 DIAGNOSIS — I10 Essential (primary) hypertension: Secondary | ICD-10-CM | POA: Diagnosis present

## 2017-01-28 DIAGNOSIS — I509 Heart failure, unspecified: Secondary | ICD-10-CM

## 2017-01-28 DIAGNOSIS — E785 Hyperlipidemia, unspecified: Secondary | ICD-10-CM | POA: Diagnosis present

## 2017-01-28 DIAGNOSIS — A419 Sepsis, unspecified organism: Secondary | ICD-10-CM | POA: Diagnosis not present

## 2017-01-28 DIAGNOSIS — G8929 Other chronic pain: Secondary | ICD-10-CM | POA: Diagnosis present

## 2017-01-28 DIAGNOSIS — Z66 Do not resuscitate: Secondary | ICD-10-CM | POA: Diagnosis not present

## 2017-01-28 DIAGNOSIS — Z951 Presence of aortocoronary bypass graft: Secondary | ICD-10-CM

## 2017-01-28 DIAGNOSIS — J9621 Acute and chronic respiratory failure with hypoxia: Secondary | ICD-10-CM | POA: Diagnosis not present

## 2017-01-28 DIAGNOSIS — J15211 Pneumonia due to Methicillin susceptible Staphylococcus aureus: Secondary | ICD-10-CM | POA: Diagnosis not present

## 2017-01-28 DIAGNOSIS — K219 Gastro-esophageal reflux disease without esophagitis: Secondary | ICD-10-CM | POA: Diagnosis present

## 2017-01-28 DIAGNOSIS — F339 Major depressive disorder, recurrent, unspecified: Secondary | ICD-10-CM | POA: Diagnosis not present

## 2017-01-28 DIAGNOSIS — Z515 Encounter for palliative care: Secondary | ICD-10-CM

## 2017-01-28 DIAGNOSIS — Z881 Allergy status to other antibiotic agents status: Secondary | ICD-10-CM

## 2017-01-28 DIAGNOSIS — Z79899 Other long term (current) drug therapy: Secondary | ICD-10-CM

## 2017-01-28 DIAGNOSIS — I4892 Unspecified atrial flutter: Secondary | ICD-10-CM | POA: Diagnosis not present

## 2017-01-28 DIAGNOSIS — I4891 Unspecified atrial fibrillation: Secondary | ICD-10-CM | POA: Diagnosis not present

## 2017-01-28 DIAGNOSIS — J440 Chronic obstructive pulmonary disease with acute lower respiratory infection: Secondary | ICD-10-CM | POA: Diagnosis present

## 2017-01-28 DIAGNOSIS — Z880 Allergy status to penicillin: Secondary | ICD-10-CM

## 2017-01-28 DIAGNOSIS — M899 Disorder of bone, unspecified: Secondary | ICD-10-CM | POA: Diagnosis present

## 2017-01-28 DIAGNOSIS — Z9289 Personal history of other medical treatment: Secondary | ICD-10-CM

## 2017-01-28 LAB — CBG MONITORING, ED: Glucose-Capillary: 110 mg/dL — ABNORMAL HIGH (ref 65–99)

## 2017-01-28 LAB — CBC WITH DIFFERENTIAL/PLATELET
BASOS ABS: 0 10*3/uL (ref 0.0–0.1)
BASOS PCT: 0 %
EOS PCT: 1 %
Eosinophils Absolute: 0.1 10*3/uL (ref 0.0–0.7)
HCT: 33.4 % — ABNORMAL LOW (ref 39.0–52.0)
Hemoglobin: 9.7 g/dL — ABNORMAL LOW (ref 13.0–17.0)
LYMPHS PCT: 15 %
Lymphs Abs: 0.9 10*3/uL (ref 0.7–4.0)
MCH: 29.4 pg (ref 26.0–34.0)
MCHC: 29 g/dL — ABNORMAL LOW (ref 30.0–36.0)
MCV: 101.2 fL — AB (ref 78.0–100.0)
MONO ABS: 0.2 10*3/uL (ref 0.1–1.0)
Monocytes Relative: 3 %
NEUTROS ABS: 5.1 10*3/uL (ref 1.7–7.7)
Neutrophils Relative %: 81 %
PLATELETS: 149 10*3/uL — AB (ref 150–400)
RBC: 3.3 MIL/uL — AB (ref 4.22–5.81)
RDW: 16.8 % — AB (ref 11.5–15.5)
WBC: 6.3 10*3/uL (ref 4.0–10.5)

## 2017-01-28 LAB — COMPREHENSIVE METABOLIC PANEL
ALK PHOS: 132 U/L — AB (ref 38–126)
ALT: 18 U/L (ref 17–63)
ANION GAP: 11 (ref 5–15)
AST: 22 U/L (ref 15–41)
Albumin: 3.5 g/dL (ref 3.5–5.0)
BILIRUBIN TOTAL: 1.4 mg/dL — AB (ref 0.3–1.2)
BUN: 90 mg/dL — AB (ref 6–20)
CALCIUM: 8 mg/dL — AB (ref 8.9–10.3)
CO2: 45 mmol/L — ABNORMAL HIGH (ref 22–32)
Chloride: 77 mmol/L — ABNORMAL LOW (ref 101–111)
Creatinine, Ser: 3.46 mg/dL — ABNORMAL HIGH (ref 0.61–1.24)
GFR calc Af Amer: 20 mL/min — ABNORMAL LOW (ref >60)
GFR, EST NON AFRICAN AMERICAN: 18 mL/min — AB (ref >60)
Glucose, Bld: 103 mg/dL — ABNORMAL HIGH (ref 65–99)
POTASSIUM: 3.7 mmol/L (ref 3.5–5.1)
Sodium: 133 mmol/L — ABNORMAL LOW (ref 135–145)
TOTAL PROTEIN: 6.4 g/dL — AB (ref 6.5–8.1)

## 2017-01-28 LAB — BLOOD GAS, VENOUS
Acid-Base Excess: 19.2 mmol/L — ABNORMAL HIGH (ref 0.0–2.0)
BICARBONATE: 42.1 mmol/L — AB (ref 20.0–28.0)
O2 Content: 15 L/min
O2 SAT: 98.1 %
PCO2 VEN: 66.4 mmHg — AB (ref 44.0–60.0)
PO2 VEN: 104 mmHg — AB (ref 32.0–45.0)
pH, Ven: 7.444 — ABNORMAL HIGH (ref 7.250–7.430)

## 2017-01-28 LAB — URINALYSIS, COMPLETE (UACMP) WITH MICROSCOPIC
BILIRUBIN URINE: NEGATIVE
GLUCOSE, UA: NEGATIVE mg/dL
HGB URINE DIPSTICK: NEGATIVE
KETONES UR: NEGATIVE mg/dL
LEUKOCYTES UA: NEGATIVE
NITRITE: NEGATIVE
PH: 5 (ref 5.0–8.0)
Protein, ur: NEGATIVE mg/dL
SPECIFIC GRAVITY, URINE: 1.012 (ref 1.005–1.030)

## 2017-01-28 LAB — BRAIN NATRIURETIC PEPTIDE: B Natriuretic Peptide: 674 pg/mL — ABNORMAL HIGH (ref 0.0–100.0)

## 2017-01-28 LAB — TROPONIN I: Troponin I: <0.03 ng/mL (ref <0.03)

## 2017-01-28 LAB — I-STAT CG4 LACTIC ACID, ED: Lactic Acid, Venous: 1.18 mmol/L (ref 0.5–1.9)

## 2017-01-28 MED ORDER — AMIODARONE HCL 200 MG PO TABS
200.0000 mg | ORAL_TABLET | Freq: Every day | ORAL | Status: DC
  Administered 2017-01-29: 200 mg via ORAL
  Filled 2017-01-28: qty 1

## 2017-01-28 MED ORDER — ATORVASTATIN CALCIUM 80 MG PO TABS
80.0000 mg | ORAL_TABLET | Freq: Every evening | ORAL | Status: DC
  Administered 2017-01-28: 80 mg via ORAL
  Filled 2017-01-28: qty 2

## 2017-01-28 MED ORDER — SODIUM CHLORIDE 0.9 % IV BOLUS (SEPSIS)
1000.0000 mL | Freq: Once | INTRAVENOUS | Status: AC
  Administered 2017-01-28: 1000 mL via INTRAVENOUS

## 2017-01-28 MED ORDER — SODIUM CHLORIDE 0.9% FLUSH: 3.0000 mL | INTRAVENOUS | Status: DC | PRN

## 2017-01-28 MED ORDER — ASPIRIN EC 81 MG PO TBEC
81.0000 mg | DELAYED_RELEASE_TABLET | Freq: Every day | ORAL | Status: DC
  Administered 2017-01-28 – 2017-02-02 (×5): 81 mg via ORAL
  Filled 2017-01-28 (×5): qty 1

## 2017-01-28 MED ORDER — SODIUM CHLORIDE 0.9% FLUSH
3.0000 mL | Freq: Two times a day (BID) | INTRAVENOUS | Status: DC
  Administered 2017-01-28 – 2017-02-01 (×5): 3 mL via INTRAVENOUS

## 2017-01-28 MED ORDER — IPRATROPIUM-ALBUTEROL 0.5-2.5 (3) MG/3ML IN SOLN
3.0000 mL | Freq: Four times a day (QID) | RESPIRATORY_TRACT | Status: DC
  Administered 2017-01-28 – 2017-02-01 (×16): 3 mL via RESPIRATORY_TRACT
  Filled 2017-01-28 (×16): qty 3

## 2017-01-28 MED ORDER — ONDANSETRON HCL 4 MG/2ML IJ SOLN: 4.0000 mg | Freq: Four times a day (QID) | INTRAMUSCULAR | Status: DC | PRN

## 2017-01-28 MED ORDER — APIXABAN 5 MG PO TABS
5.0000 mg | ORAL_TABLET | Freq: Two times a day (BID) | ORAL | Status: DC
  Administered 2017-01-28 – 2017-01-29 (×2): 5 mg via ORAL
  Filled 2017-01-28 (×3): qty 1

## 2017-01-28 MED ORDER — SENNA 8.6 MG PO TABS
1.0000 | ORAL_TABLET | Freq: Every day | ORAL | Status: DC
  Administered 2017-01-28 – 2017-02-01 (×4): 8.6 mg via ORAL
  Filled 2017-01-28 (×4): qty 1

## 2017-01-28 MED ORDER — FUROSEMIDE 10 MG/ML IJ SOLN
80.0000 mg | Freq: Once | INTRAMUSCULAR | Status: AC
  Administered 2017-01-28: 80 mg via INTRAVENOUS
  Filled 2017-01-28: qty 8

## 2017-01-28 MED ORDER — SODIUM CHLORIDE 0.9 % IV SOLN
250.0000 mL | INTRAVENOUS | Status: DC | PRN
  Administered 2017-01-29: 250 mL via INTRAVENOUS

## 2017-01-28 MED ORDER — CARVEDILOL 3.125 MG PO TABS
3.1250 mg | ORAL_TABLET | Freq: Two times a day (BID) | ORAL | Status: DC
  Administered 2017-01-29: 3.125 mg via ORAL
  Filled 2017-01-28: qty 1

## 2017-01-28 MED ORDER — ORAL CARE MOUTH RINSE
15.0000 mL | Freq: Two times a day (BID) | OROMUCOSAL | Status: DC
  Administered 2017-01-28 – 2017-01-30 (×3): 15 mL via OROMUCOSAL

## 2017-01-28 MED ORDER — ACETAMINOPHEN 325 MG PO TABS: 650.0000 mg | ORAL_TABLET | ORAL | Status: DC | PRN

## 2017-01-28 MED ORDER — FUROSEMIDE 10 MG/ML IJ SOLN
60.0000 mg | Freq: Four times a day (QID) | INTRAMUSCULAR | Status: DC
  Administered 2017-01-28 – 2017-01-29 (×3): 60 mg via INTRAVENOUS
  Filled 2017-01-28 (×3): qty 6

## 2017-01-28 MED ORDER — GABAPENTIN 100 MG PO CAPS
100.0000 mg | ORAL_CAPSULE | Freq: Two times a day (BID) | ORAL | Status: DC
  Administered 2017-01-28 – 2017-01-29 (×2): 100 mg via ORAL
  Filled 2017-01-28 (×3): qty 1

## 2017-01-28 NOTE — ED Notes (Addendum)
RT in to start Bipap. Settings 16/8 and 50 %

## 2017-01-28 NOTE — ED Notes (Signed)
Dr Lorin Mercy in to see. Wants to place pt on Bipap. RT notified

## 2017-01-28 NOTE — ED Notes (Signed)
Attempting to titrate down to 6 L/via [place]. Dr Dayna Barker aware of BP.

## 2017-01-28 NOTE — ED Provider Notes (Signed)
Emergency Department Provider Note   I have reviewed the triage vital signs and the nursing notes.   HISTORY  Chief Complaint Respiratory Distress   HPI Darrell Black is a 61 y.o. male with multiple medical problems as documented below but also with noncompliance.  He was just recently discharged from here to the nursing facility.  EMS was called out secondary to unresponsiveness when they got there he had shallow bradypnea and was responsive only to severe pain.  There initially unable to get oxygenation saturation however his CO2 was above 60.  He had BiPAP on when they got there but they were unsure of how long he had been on and he has reports to be noncompliant at night.  He is put on nonrebreather and brought here for further evaluation on arrival here the patient's alert to voice but it still sleepy and unable to participate much in history.   Level V Caveat Secondary to Altered Mental Status  Past Medical History:  Diagnosis Date  . Atherosclerosis   . Atrial fibrillation ([place])   . 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 Chest pain   . Chronic combined systolic and diastolic heart failure ([place])    a. 12/2016: echo showing EF of 35-40% , diffuse HK, mild TR, and PA pressure of 38 mm Hg  . Chronic pain   . Coronary artery disease    a. s/p CABG in 2001  . 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 Hyperlipidemia   . Hypertension    essential  . Ischemic cardiomyopathy   . Major depressive disorder, recurrent ([place])   . Morbid obesity due to excess calories ([place])   . Muscle weakness (generalized)   . Neuropathy   . Obstructive sleep apnea   . Presence of automatic cardioverter/defibrillator (AICD)   . Renal disorder    Chronic kidney disease  . Restless leg syndrome   . Sick-euthyroid syndrome     Patient Active Problem List   Diagnosis Date Noted  . ICD (implantable cardioverter-defibrillator) in place 01/14/2017  .  Volume overload 01/14/2017  . Ischemic cardiomyopathy 01/14/2017  . CKD (chronic kidney disease) stage 3, GFR 30-59 ml/min (HCC) 01/14/2017  . Acute on chronic combined systolic and diastolic CHF (congestive heart failure) ([place]) 01/14/2017  . Polyp of sigmoid colon   . Morbid obesity due to excess calories ([place]) 12/18/2016  . Hypernatremia 12/18/2016  . Rectal bleeding   . Acute on chronic respiratory failure with hypercapnia ([place])   . Visual hallucination   . 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 Acute respiratory insufficiency 12/12/2016  . Obstructive sleep apnea 12/12/2016  . Acute respiratory failure with hypoxia and hypercarbia ([place]) 12/12/2016  . Acute psychosis ([place]) 12/12/2016  . Atrial fibrillation ([place]) 12/12/2016  . Oral bleeding 12/12/2016  . Coronary artery disease 12/12/2016  . Restless leg syndrome 12/12/2016  . Hypertension 12/12/2016  . Hyperlipidemia 12/12/2016  . Chronic combined systolic and diastolic heart failure ([place]) 12/12/2016  . Hyperammonemia ([place]) 12/12/2016    Past Surgical History:  Procedure Laterality Date  . COLONOSCOPY N/A 12/20/2016   Performed by Mauri Pole, MD at [facility] ENDOSCOPY      Allergies Fish allergy; Ampicillin; Clindamycin/lincomycin; and Penicillins  Family History  Problem Relation Age of Onset  . Hypertension Father   . Cancer Maternal Grandmother   . Alcohol abuse Maternal Grandfather  Social History Social History   Tobacco Use  . Smoking status: Never Smoker  . Smokeless tobacco: Never Used  Substance Use Topics  . Alcohol use: No  . Drug use: No    Review of Systems  Level V Caveat Secondary to Altered Mental Status ____________________________________________   PHYSICAL EXAM:  VITAL SIGNS: ED Triage Vitals  Enc Vitals Group     BP 01/31/2017 1251 108/80     Pulse Rate 01/13/2017 1251 70     Resp 02/05/2017 1251 (!) 30      Temp --      Temp src --      SpO2 01/21/2017 1251 100 %     Weight 01/27/2017 1252 (!) 332 lb (150.6 kg)    Constitutional: Alert and oriented. Well appearing and in no acute distress. Eyes: Conjunctivae are normal. PERRL. EOMI. Head: Atraumatic. Nose: No congestion/rhinnorhea. Mouth/Throat: Mucous membranes are moist.  Oropharynx non-erythematous. Neck: No stridor.  No meningeal signs.   Cardiovascular: Normal rate, regular rhythm. Good peripheral circulation. Grossly normal heart sounds.   Respiratory: tachypneic respiratory effort.  No retractions. Lungs diminished with crackles. Gastrointestinal: Soft and nontender. No distention.  Musculoskeletal: No lower extremity tenderness but has mild edema to upper legs. No gross deformities of extremities. Neurologic:  Normal speech and language. No gross focal neurologic deficits are appreciated.  Skin:  Skin is warm, dry and intact. No rash noted.   ____________________________________________   LABS (all labs ordered are listed, but only abnormal results are displayed)  Labs Reviewed  COMPREHENSIVE METABOLIC PANEL - Abnormal; Notable for the following components:      Result Value   Sodium 133 (*)    Chloride 77 (*)    CO2 45 (*)    Glucose, Bld 103 (*)    BUN 90 (*)    Creatinine, Ser 3.46 (*)    Calcium 8.0 (*)    Total Protein 6.4 (*)    Alkaline Phosphatase 132 (*)    Total Bilirubin 1.4 (*)    GFR calc non Af Amer 18 (*)    GFR calc Af Amer 20 (*)    All other components within normal limits  CBC WITH DIFFERENTIAL/PLATELET - Abnormal; Notable for the following components:   RBC 3.30 (*)    Hemoglobin 9.7 (*)    HCT 33.4 (*)    MCV 101.2 (*)    MCHC 29.0 (*)    RDW 16.8 (*)    Platelets 149 (*)    All other components within normal limits  URINALYSIS, COMPLETE (UACMP) WITH MICROSCOPIC - Abnormal; Notable for the following components:   Bacteria, UA RARE (*)    Squamous Epithelial / LPF 0-5 (*)    All other  components within normal limits  BLOOD GAS, VENOUS - Abnormal; Notable for the following components:   pH, Ven 7.444 (*)    pCO2, Ven 66.4 (*)    pO2, Ven 104.0 (*)    Bicarbonate 42.1 (*)    Acid-Base Excess 19.2 (*)    All other components within normal limits  BRAIN NATRIURETIC PEPTIDE - Abnormal; Notable for the following components:   B Natriuretic Peptide 674.0 (*)    All other components within normal limits  CBG MONITORING, ED - Abnormal; Notable for the following components:   Glucose-Capillary 110 (*)    All other components within normal limits  TROPONIN I  CBC WITH DIFFERENTIAL/PLATELET  BASIC METABOLIC PANEL  CBC WITH DIFFERENTIAL/PLATELET  I-STAT CHEM 8, ED  I-STAT CG4  LACTIC ACID, ED   ____________________________________________  EKG   EKG Interpretation  Date/Time:  Monday January 28 2017 12:49:06 EST Ventricular Rate:  70 PR Interval:    QRS Duration: 196 QT Interval:  519 QTC Calculation: 561 R Axis:   -92 Text Interpretation:  Right and left arm electrode reversal, interpretation assumes no reversal Sinus rhythm Short PR interval Nonspecific IVCD with LAD Abnormal T, consider ischemia, lateral leads Baseline wander in lead(s) III No significant change since last tracing Confirmed by Merrily Pew [phone]) on 01/21/2017 12:55:30 PM       ____________________________________________  RADIOLOGY  Dg Chest Port 1 View  Result Date: 01/18/2017 CLINICAL DATA:  Respiratory distress. History of atrial fibrillation, CHF, coronary artery disease, nonsmoker. EXAM: PORTABLE CHEST 1 VIEW COMPARISON:  Portable chest x-ray of January 17, 2017 FINDINGS: The lungs remain hypoinflated which is accentuated by the lordotic positioning. The interstitial markings remain increased. The pulmonary vascularity remains engorged and the cardiac silhouette enlarged. The ICD is in stable position. There is a probable small right pleural effusion. IMPRESSION: CHF with pulmonary  interstitial edema. There has been slight deterioration since the previous study. Electronically Signed   By: David  [place] M.D.   On: 01/27/2017 14:11    ____________________________________________   PROCEDURES  Procedure(s) performed:   Procedures  CRITICAL CARE Performed by: Merrily Pew Total critical care time: 35 minutes Critical care time was exclusive of separately billable procedures and treating other patients. Critical care was necessary to treat or prevent imminent or life-threatening deterioration. Critical care was time spent personally by me on the following activities: development of treatment plan with patient and/or surrogate as well as nursing, discussions with consultants, evaluation of patient's response to treatment, examination of patient, obtaining history from patient or surrogate, ordering and performing treatments and interventions, ordering and review of laboratory studies, ordering and review of radiographic studies, pulse oximetry and re-evaluation of patient's condition.  ____________________________________________   INITIAL IMPRESSION / ASSESSMENT AND PLAN / ED COURSE  Pertinent labs & imaging results that were available during my care of the patient were reviewed by me and considered in my medical decision making (see chart for details).  Increasing responsiveness with appropriate oxygenation, will workup for chf vs acs vs infectious causes for symptoms with frequent reevaluations of mental status and respiratory status.  After multiple reevaluations patient persistently hypoxic when sleeping to 89-90% however it is in the mid 90s awake.  His workup seems most consistent with likely CHF exacerbation.  Discussed with the hospitalist and will admit for the same  ____________________________________________  FINAL CLINICAL IMPRESSION(S) / ED DIAGNOSES  Final diagnoses:  Acute respiratory failure with hypoxia (HCC)  Acute pulmonary edema (HCC)    Congestive heart failure, unspecified HF chronicity, unspecified heart failure type ([place])     MEDICATIONS GIVEN DURING THIS VISIT:  Medications  amiodarone (PACERONE) tablet 200 mg (not administered)  apixaban (ELIQUIS) tablet 5 mg (not administered)  atorvastatin (LIPITOR) tablet 80 mg (80 mg Oral Given 01/20/2017 2000)  carvedilol (COREG) tablet 3.125 mg (not administered)  gabapentin (NEURONTIN) capsule 100 mg (not administered)  ipratropium-albuterol (DUONEB) 0.5-2.5 (3) MG/3ML nebulizer solution 3 mL (3 mLs Nebulization Given 02/03/2017 2006)  senna (SENOKOT) tablet 8.6 mg (not administered)  sodium chloride flush (NS) 0.9 % injection 3 mL (not administered)  sodium chloride flush (NS) 0.9 % injection 3 mL (not administered)  0.9 %  sodium chloride infusion (not administered)  aspirin EC tablet 81 mg (81 mg Oral Given 01/13/2017 2000)  acetaminophen (TYLENOL) tablet 650 mg (not administered)  ondansetron (ZOFRAN) injection 4 mg (not administered)  furosemide (LASIX) injection 60 mg (not administered)  sodium chloride 0.9 % bolus 1,000 mL (0 mLs Intravenous Stopped 01/21/2017 1546)  furosemide (LASIX) injection 80 mg (80 mg Intravenous Given 01/23/2017 1540)     NEW OUTPATIENT MEDICATIONS STARTED DURING THIS VISIT:  This SmartLink is deprecated. Use AVSMEDLIST instead to display the medication list for a patient.  Note:  This document was prepared using Dragon voice recognition software and may include unintentional dictation errors.   Merrily Pew, MD 01/23/2017 2215

## 2017-01-28 NOTE — ED Notes (Signed)
sats decreasing to 88 % O2 increase to 10 L w sats 95

## 2017-01-28 NOTE — H&P (Addendum)
History and Physical    Raynell Scott MWN:[id number] DOB: 06/01/1955 DOA: 01/27/2017  PCP: Caprice Renshaw, MD  Patient coming from: [facility]  Chief Complaint: Respiratory Distress  HPI: Darrell Black is a 61 y.o. male with medical history significant of hx of afib, CAD, hx CABG/ subsequent stents, combined s/d CHF, HL, DM, HTN, ICM, depression, morbid obesity, OSA , AICD in place and CKD who was recently admitted from 11/5-14 for heart failure (EF 35-40%) and acute on chronic respiratory failure with hypercapnia.  He is back with respiratory distress and is quite somnolent.  He reports that it is "Hard to breathe".  He denies any pain.  Wears home O2 at night but was up to 10L in the ER at the time of my evaluation.  He was unable to effectively answer questions.  HPI per Dr. Dayna Barker:  [name] is a 61 y.o. male with multiple medical problems as documented below but also with noncompliance. He was just recently discharged from here to the nursing facility. EMS was called out secondary to unresponsiveness when they got there he had shallow bradypnea and was responsive only to severe pain. There initially unable to get oxygenation saturation however his CO2 was above 60. He had BiPAP on when they got there but they were unsure of how long he had been on and he has reports to be noncompliant at night. He is put on nonrebreather and brought here for further evaluation on arrival here the patient's alert to voice but it still sleepy and unable to participate much in history.   ED Course: Increasing responsiveness with appropriate oxygenation.  Evaluation indicates repeat CHF exacerbation.  Review of Systems: Unable to perform  PMH, PSH, FH, and SH reviewed in Epic.  It is unlikely to have significantly changed since his hospital discharge 5 days ago.  Past Medical History:  Diagnosis Date  . Atherosclerosis   . Atrial fibrillation ([place])   . 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 Chest pain   . Chronic combined systolic and diastolic heart failure ([place])    a. 12/2016: echo showing EF of 35-40% , diffuse HK, mild TR, and PA pressure of 38 mm Hg  . Chronic pain   . Coronary artery disease    a. s/p CABG in 2001  . 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 Hyperlipidemia   . Hypertension    essential  . Ischemic cardiomyopathy   . Major depressive disorder, recurrent ([place])   . Morbid obesity due to excess calories ([place])   . Muscle weakness (generalized)   . Neuropathy   . Obstructive sleep apnea   . Presence of automatic cardioverter/defibrillator (AICD)   . Renal disorder    Chronic kidney disease  . Restless leg syndrome   . Sick-euthyroid syndrome     Past Surgical History:  Procedure Laterality Date  . COLONOSCOPY N/A 12/20/2016   Performed by Mauri Pole, MD at [facility] History   Socioeconomic History  . Marital status: Single    Spouse name: Not on file  . Number of children: Not on file  . Years of education: Not on file  . Highest education level: Not on file  Social Needs  . Financial resource strain: Not on file  . Food insecurity - worry: Not on file  . Food insecurity - inability: Not on file  . Transportation needs - medical: Not on file  .  Transportation needs - non-medical: Not on file  Occupational History  . Not on file  Tobacco Use  . Smoking status: Never Smoker  . Smokeless tobacco: Never Used  Substance and Sexual Activity  . Alcohol use: No  . Drug use: No  . Sexual activity: No  Other Topics Concern  . Not on file  Social History Narrative  . Not on file    Allergies  Allergen Reactions  . Fish Allergy   . Ampicillin Rash  . Clindamycin/Lincomycin Rash  . Penicillins Rash    Has patient had a PCN reaction causing immediate rash, facial/tongue/throat swelling, SOB or lightheadedness with hypotension: Yes Has patient had a PCN reaction causing severe  rash involving mucus membranes or skin necrosis: No Has patient had a PCN reaction that required hospitalization: No Has patient had a PCN reaction occurring within the last 10 years: No If all of the above answers are "NO", then may proceed with Cephalosporin use.     Family History  Problem Relation Age of Onset  . Hypertension Father   . Cancer Maternal Grandmother   . Alcohol abuse Maternal Grandfather     Prior to Admission medications   Medication Sig Start Date End Date Taking? Authorizing Provider  acetaminophen (TYLENOL) 325 MG tablet Take 650 mg by mouth every 6 (six) hours as needed.   Yes [provider]  amiodarone (PACERONE) 200 MG tablet Take 200 mg by mouth daily.   Yes [provider]  apixaban (ELIQUIS) 5 MG TABS tablet Take 1 tablet (5 mg total) by mouth 2 (two) times daily. 12/31/16  Yes Isaac Bliss, Rayford Halsted, MD  atorvastatin (LIPITOR) 80 MG tablet Take 80 mg by mouth every evening.   Yes [provider]  carvedilol (COREG) 3.125 MG tablet Take 3.125 mg by mouth 2 (two) times daily with a meal.   Yes [provider]  docusate sodium (COLACE) 100 MG capsule Take 100 mg by mouth daily.   Yes [provider]  gabapentin (NEURONTIN) 100 MG capsule Take 1 capsule (100 mg total) by mouth 2 (two) times daily. 12/31/16  Yes Isaac Bliss, Rayford Halsted, MD  guaiFENesin (ROBITUSSIN) 100 MG/5ML SOLN Take 5 mLs every 8 (eight) hours as needed by mouth for cough or to loosen phlegm.   Yes [provider]  ipratropium-albuterol (DUONEB) 0.5-2.5 (3) MG/3ML SOLN Take 3 mLs every 6 (six) hours as needed by nebulization (shortness of breath).   Yes [provider]  metolazone (ZAROXOLYN) 5 MG tablet Take 1 tablet (5 mg total) every other day by mouth. 01/23/17  Yes Isaac Bliss, Rayford Halsted, MD  nitroGLYCERIN (NITROSTAT) 0.4 MG SL tablet Place 0.4 mg under the tongue every 5 (five) minutes as needed for chest pain.   Yes  [provider]  rOPINIRole (REQUIP) 1 MG tablet Take 1 mg by mouth at bedtime.   Yes [provider]  senna (SENOKOT) 8.6 MG TABS tablet Take 1 tablet by mouth at bedtime.   Yes [provider]  tiotropium (SPIRIVA) 18 MCG inhalation capsule Place 18 mcg into inhaler and inhale daily.   Yes [provider]  torsemide (DEMADEX) 100 MG tablet Take 1 tablet (100 mg total) daily by mouth. 01/24/17  Yes Isaac Bliss, Rayford Halsted, MD    Physical Exam: Vitals:   01/12/2017 1900 01/12/2017 1915 01/24/2017 2000 01/16/2017 2008  BP: (!) 76/55 (!) 91/59 (!) 92/57   Pulse:  70 70   Resp: (!) 21 (!) 26 (!)  21   Temp:   97.7 F (36.5 C)   TempSrc:   Axillary   SpO2:  97% 95% 93%  Weight:      Height:         General:  Obtunded; wakes briefly to answer questions and then lapses back into deep sleep Eyes:  PERRL, EOMI, normal lids, iris ENT:  grossly normal hearing, lips & tongue, mmm Neck:  no LAD, masses or thyromegaly; no carotid bruits Cardiovascular:  RRR, no m/r/g. 2+ LE edema.  Respiratory:   CTA bilaterally with no wheezes/rales/rhonchi.  Normal respiratory effort. Abdomen:  soft, NT, ND, NABS Skin:  no rash or induration seen on limited exam Musculoskeletal:  grossly normal tone BUE/BLE, good ROM, no bony abnormality Psychiatric: Obtunded, answers short and limited questions and then lapses back into deep sleep Neurologic: Unable to assess    Radiological Exams on Admission: Dg Chest Port 1 View  Result Date: 02/03/2017 CLINICAL DATA:  Respiratory distress. History of atrial fibrillation, CHF, coronary artery disease, nonsmoker. EXAM: PORTABLE CHEST 1 VIEW COMPARISON:  Portable chest x-ray of January 17, 2017 FINDINGS: The lungs remain hypoinflated which is accentuated by the lordotic positioning. The interstitial markings remain increased. The pulmonary vascularity remains engorged and the cardiac silhouette enlarged. The ICD is in stable position.  There is a probable small right pleural effusion. IMPRESSION: CHF with pulmonary interstitial edema. There has been slight deterioration since the previous study. Electronically Signed   By: David  Martinique M.D.   On: 01/26/2017 14:11    EKG: Independently reviewed.  NSR with rate 70; nonspecific ST changes with no evidence of acute ischemia; NSCSLT   Labs on Admission: I have personally reviewed the available labs and imaging studies at the time of the admission.  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  It showed EF 35-40% with inability to assess diastolic function due to afib. -Will start ASA -No ACE due to renal failure -Continue Coreg -CHF order set utilized -Was given IVF and then Lasix 80 mg x 1 in ER and will repeat with Lasix 60 mg IV q6h -Continue BIPAP for now - he uses this at home and appeared to be more hypercapnic than his VBG would imply -Repeat EKG in AM -Negative  troponin -Continue Amiodarone -Continue Eliquis  Renal failure -Likely associated with decreased renal perfusion in the setting of acute CHF -Will diurese and follow  HTN -Continue Coreg -Hold ACE - this may need to be discontinued due to CKD    DVT prophylaxis: Eliquis Code Status:  Full - confirmed with patient Family Communication: None present Disposition Plan:  Home once clinically improved Consults called: None - required [place] and [place] during prior hospitalization Admission status: Admit - It is my clinical opinion that admission to INPATIENT is reasonable and necessary because this patient will require at least 2 midnights in the hospital to treat this condition based on the medical complexity of the problems presented.  Given the aforementioned information, the predictability of an adverse outcome is felt to be significant.   Total critical care time: 65 minutes Critical care time was exclusive of separately billable procedures and treating other patients. Critical care was necessary to treat or prevent imminent or life-threatening deterioration. Critical care was time spent personally by me on the following activities: development of treatment plan with patient and/or surrogate as well as nursing, discussions with consultants, evaluation of patient's response to treatment, examination of patient, obtaining history from patient or surrogate, ordering and performing treatments and interventions, ordering and review of laboratory studies, ordering and review of radiographic studies, pulse oximetry and re-evaluation of patient's condition.       Karmen Bongo MD [facility]  If note is complete, please contact covering daytime or nighttime physician. [email or web address] Password [facility]  01/11/2017, 9:48 PM

## 2017-01-28 NOTE — ED Triage Notes (Signed)
Pt sent from [facility] due to resp distress. Approx 20 mins found to be decreased responsiveness. Response to pain only. O2 sat 74 % on 4 L initially. Resp very shallow per EMS. Pt is verbal at this time.

## 2017-01-29 ENCOUNTER — Inpatient Hospital Stay (HOSPITAL_COMMUNITY): Payer: Medicaid Other

## 2017-01-29 ENCOUNTER — Encounter (HOSPITAL_COMMUNITY): Payer: Self-pay

## 2017-01-29 ENCOUNTER — Other Ambulatory Visit: Payer: Self-pay

## 2017-01-29 DIAGNOSIS — Z515 Encounter for palliative care: Secondary | ICD-10-CM

## 2017-01-29 DIAGNOSIS — Z7189 Other specified counseling: Secondary | ICD-10-CM

## 2017-01-29 DIAGNOSIS — Z978 Presence of other specified devices: Secondary | ICD-10-CM

## 2017-01-29 DIAGNOSIS — J9621 Acute and chronic respiratory failure with hypoxia: Secondary | ICD-10-CM

## 2017-01-29 DIAGNOSIS — I10 Essential (primary) hypertension: Secondary | ICD-10-CM

## 2017-01-29 DIAGNOSIS — E662 Morbid (severe) obesity with alveolar hypoventilation: Secondary | ICD-10-CM

## 2017-01-29 DIAGNOSIS — G4733 Obstructive sleep apnea (adult) (pediatric): Secondary | ICD-10-CM

## 2017-01-29 DIAGNOSIS — Z789 Other specified health status: Secondary | ICD-10-CM

## 2017-01-29 LAB — BASIC METABOLIC PANEL
Anion gap: 13 (ref 5–15)
Anion gap: 15 (ref 5–15)
Anion gap: 13 (ref 5–15)
BUN: 93 mg/dL — ABNORMAL HIGH (ref 6–20)
BUN: 94 mg/dL — AB (ref 6–20)
BUN: 96 mg/dL — AB (ref 6–20)
CHLORIDE: 78 mmol/L — AB (ref 101–111)
CHLORIDE: 77 mmol/L — AB (ref 101–111)
CHLORIDE: 78 mmol/L — AB (ref 101–111)
CO2: 44 mmol/L — AB (ref 22–32)
CO2: 43 mmol/L — AB (ref 22–32)
CO2: 44 mmol/L — AB (ref 22–32)
CREATININE: 3.49 mg/dL — AB (ref 0.61–1.24)
CREATININE: 3.78 mg/dL — AB (ref 0.61–1.24)
Calcium: 8 mg/dL — ABNORMAL LOW (ref 8.9–10.3)
Calcium: 7.8 mg/dL — ABNORMAL LOW (ref 8.9–10.3)
Calcium: 8 mg/dL — ABNORMAL LOW (ref 8.9–10.3)
Creatinine, Ser: 3.65 mg/dL — ABNORMAL HIGH (ref 0.61–1.24)
GFR calc Af Amer: 20 mL/min — ABNORMAL LOW (ref >60)
GFR calc Af Amer: 18 mL/min — ABNORMAL LOW (ref >60)
GFR calc non Af Amer: 17 mL/min — ABNORMAL LOW (ref >60)
GFR calc non Af Amer: 16 mL/min — ABNORMAL LOW (ref >60)
GFR calc non Af Amer: 17 mL/min — ABNORMAL LOW (ref >60)
GFR, EST AFRICAN AMERICAN: 19 mL/min — AB (ref >60)
GLUCOSE: 188 mg/dL — AB (ref 65–99)
Glucose, Bld: 106 mg/dL — ABNORMAL HIGH (ref 65–99)
Glucose, Bld: 136 mg/dL — ABNORMAL HIGH (ref 65–99)
POTASSIUM: 3.3 mmol/L — AB (ref 3.5–5.1)
Potassium: 3.4 mmol/L — ABNORMAL LOW (ref 3.5–5.1)
Potassium: 3.7 mmol/L (ref 3.5–5.1)
SODIUM: 135 mmol/L (ref 135–145)
Sodium: 135 mmol/L (ref 135–145)
Sodium: 135 mmol/L (ref 135–145)

## 2017-01-29 LAB — BLOOD GAS, ARTERIAL
Acid-Base Excess: 14.1 mmol/L — ABNORMAL HIGH (ref 0.0–2.0)
Acid-Base Excess: 19.1 mmol/L — ABNORMAL HIGH (ref 0.0–2.0)
BICARBONATE: 42.1 mmol/L — AB (ref 20.0–28.0)
Bicarbonate: 35.9 mmol/L — ABNORMAL HIGH (ref 20.0–28.0)
DRAWN BY: 234301
Drawn by: 234301
FIO2: 100
FIO2: 100
LHR: 16 {breaths}/min
LHR: 16 {breaths}/min
MECHVT: 550 mL
O2 SAT: 92.8 %
O2 Saturation: 97.9 %
PCO2 ART: 83.6 mmHg — AB (ref 32.0–48.0)
PEEP/CPAP: 5 cmH2O
PEEP/CPAP: 5 cmH2O
PO2 ART: 75.1 mmHg — AB (ref 83.0–108.0)
PO2 ART: 104 mmHg (ref 83.0–108.0)
VT: 550 mL
pCO2 arterial: 69.8 mmHg (ref 32.0–48.0)
pH, Arterial: 7.309 — ABNORMAL LOW (ref 7.350–7.450)
pH, Arterial: 7.425 (ref 7.350–7.450)

## 2017-01-29 LAB — CBC
HEMATOCRIT: 34.7 % — AB (ref 39.0–52.0)
HEMOGLOBIN: 9.9 g/dL — AB (ref 13.0–17.0)
MCH: 29.5 pg (ref 26.0–34.0)
MCHC: 28.5 g/dL — AB (ref 30.0–36.0)
MCV: 103.3 fL — AB (ref 78.0–100.0)
Platelets: 172 10*3/uL (ref 150–400)
RBC: 3.36 MIL/uL — AB (ref 4.22–5.81)
RDW: 17 % — ABNORMAL HIGH (ref 11.5–15.5)
WBC: 9.3 10*3/uL (ref 4.0–10.5)

## 2017-01-29 LAB — GLUCOSE, CAPILLARY
GLUCOSE-CAPILLARY: 100 mg/dL — AB (ref 65–99)
GLUCOSE-CAPILLARY: 139 mg/dL — AB (ref 65–99)
GLUCOSE-CAPILLARY: 142 mg/dL — AB (ref 65–99)
Glucose-Capillary: 126 mg/dL — ABNORMAL HIGH (ref 65–99)
Glucose-Capillary: 123 mg/dL — ABNORMAL HIGH (ref 65–99)

## 2017-01-29 LAB — CBC WITH DIFFERENTIAL/PLATELET
BASOS PCT: 0 %
Basophils Absolute: 0 10*3/uL (ref 0.0–0.1)
EOS ABS: 0 10*3/uL (ref 0.0–0.7)
EOS PCT: 1 %
HCT: 35.4 % — ABNORMAL LOW (ref 39.0–52.0)
HEMOGLOBIN: 10.1 g/dL — AB (ref 13.0–17.0)
LYMPHS ABS: 0.4 10*3/uL — AB (ref 0.7–4.0)
Lymphocytes Relative: 7 %
MCH: 29.4 pg (ref 26.0–34.0)
MCHC: 28.5 g/dL — ABNORMAL LOW (ref 30.0–36.0)
MCV: 102.9 fL — ABNORMAL HIGH (ref 78.0–100.0)
Monocytes Absolute: 0.7 10*3/uL (ref 0.1–1.0)
Monocytes Relative: 12 %
NEUTROS PCT: 80 %
Neutro Abs: 4.5 10*3/uL (ref 1.7–7.7)
PLATELETS: 134 10*3/uL — AB (ref 150–400)
RBC: 3.44 MIL/uL — AB (ref 4.22–5.81)
RDW: 17.1 % — ABNORMAL HIGH (ref 11.5–15.5)
WBC: 5.6 10*3/uL (ref 4.0–10.5)

## 2017-01-29 LAB — MAGNESIUM
MAGNESIUM: 1.7 mg/dL (ref 1.7–2.4)
Magnesium: 1.6 mg/dL — ABNORMAL LOW (ref 1.7–2.4)

## 2017-01-29 LAB — PROTIME-INR
INR: 2.35
Prothrombin Time: 25.5 seconds — ABNORMAL HIGH (ref 11.4–15.2)

## 2017-01-29 LAB — LACTIC ACID, PLASMA: LACTIC ACID, VENOUS: 2 mmol/L — AB (ref 0.5–1.9)

## 2017-01-29 LAB — TROPONIN I: Troponin I: <0.03 ng/mL (ref <0.03)

## 2017-01-29 MED ORDER — FUROSEMIDE 10 MG/ML IJ SOLN: 80.0000 mg | Freq: Four times a day (QID) | INTRAMUSCULAR | Status: DC

## 2017-01-29 MED ORDER — MIDAZOLAM HCL 2 MG/2ML IJ SOLN
2.0000 mg | INTRAMUSCULAR | Status: DC | PRN
  Administered 2017-01-30: 2 mg via INTRAVENOUS

## 2017-01-29 MED ORDER — INSULIN ASPART 100 UNIT/ML ~~LOC~~ SOLN
0.0000 [IU] | Freq: Three times a day (TID) | SUBCUTANEOUS | Status: DC
  Administered 2017-01-29: 2 [IU] via SUBCUTANEOUS

## 2017-01-29 MED ORDER — INSULIN ASPART 100 UNIT/ML ~~LOC~~ SOLN: 0.0000 [IU] | Freq: Every day | SUBCUTANEOUS | Status: DC

## 2017-01-29 MED ORDER — POTASSIUM CHLORIDE CRYS ER 20 MEQ PO TBCR
40.0000 meq | EXTENDED_RELEASE_TABLET | Freq: Every day | ORAL | Status: DC
  Administered 2017-01-29: 40 meq via ORAL
  Filled 2017-01-29: qty 2

## 2017-01-29 MED ORDER — SODIUM CHLORIDE 0.9% FLUSH
10.0000 mL | Freq: Two times a day (BID) | INTRAVENOUS | Status: DC
  Administered 2017-01-30: 10 mL

## 2017-01-29 MED ORDER — NOREPINEPHRINE BITARTRATE 1 MG/ML IV SOLN
0.0000 ug/min | INTRAVENOUS | Status: DC
  Administered 2017-01-29: 1 ug/min via INTRAVENOUS
  Filled 2017-01-29: qty 4

## 2017-01-29 MED ORDER — NOREPINEPHRINE 16 MG/250ML-% IV SOLN
INTRAVENOUS | Status: AC
  Filled 2017-01-29: qty 250

## 2017-01-29 MED ORDER — FENTANYL CITRATE (PF) 100 MCG/2ML IJ SOLN
100.0000 ug | INTRAMUSCULAR | Status: DC | PRN
  Administered 2017-01-30: 100 ug via INTRAVENOUS

## 2017-01-29 MED ORDER — PANTOPRAZOLE SODIUM 40 MG IV SOLR
40.0000 mg | Freq: Every day | INTRAVENOUS | Status: DC
  Administered 2017-01-29 – 2017-02-02 (×5): 40 mg via INTRAVENOUS
  Filled 2017-01-29 (×5): qty 40

## 2017-01-29 MED ORDER — FENTANYL CITRATE (PF) 100 MCG/2ML IJ SOLN
100.0000 ug | INTRAMUSCULAR | Status: DC | PRN
  Administered 2017-01-29 – 2017-02-01 (×3): 100 ug via INTRAVENOUS
  Filled 2017-01-29 (×4): qty 2

## 2017-01-29 MED ORDER — CHLORHEXIDINE GLUCONATE CLOTH 2 % EX PADS
6.0000 | MEDICATED_PAD | Freq: Every day | CUTANEOUS | Status: DC
  Administered 2017-01-30: 6 via TOPICAL

## 2017-01-29 MED ORDER — FUROSEMIDE 10 MG/ML IJ SOLN: 80.0000 mg | Freq: Three times a day (TID) | INTRAMUSCULAR | Status: DC

## 2017-01-29 MED ORDER — NOREPINEPHRINE BITARTRATE 1 MG/ML IV SOLN
0.0000 ug/min | INTRAVENOUS | Status: DC
  Administered 2017-01-29: 15 ug/min via INTRAVENOUS
  Administered 2017-01-30: 30 ug/min via INTRAVENOUS
  Filled 2017-01-29 (×2): qty 16

## 2017-01-29 MED ORDER — MIDAZOLAM HCL 2 MG/2ML IJ SOLN
2.0000 mg | INTRAMUSCULAR | Status: DC | PRN
  Administered 2017-01-29 – 2017-01-30 (×4): 2 mg via INTRAVENOUS
  Filled 2017-01-29 (×4): qty 2

## 2017-01-29 MED ORDER — SODIUM CHLORIDE 0.9% FLUSH: 10.0000 mL | INTRAVENOUS | Status: DC | PRN

## 2017-01-29 MED ORDER — INSULIN ASPART 100 UNIT/ML ~~LOC~~ SOLN
0.0000 [IU] | SUBCUTANEOUS | Status: DC
  Administered 2017-01-29 – 2017-01-30 (×3): 3 [IU] via SUBCUTANEOUS
  Administered 2017-01-30: 4 [IU] via SUBCUTANEOUS
  Administered 2017-01-31 (×2): 3 [IU] via SUBCUTANEOUS
  Administered 2017-01-31: 4 [IU] via SUBCUTANEOUS
  Administered 2017-01-31 – 2017-02-01 (×3): 3 [IU] via SUBCUTANEOUS
  Administered 2017-02-01: 4 [IU] via SUBCUTANEOUS
  Administered 2017-02-01: 3 [IU] via SUBCUTANEOUS
  Administered 2017-02-01 – 2017-02-02 (×6): 4 [IU] via SUBCUTANEOUS
  Administered 2017-02-02: 7 [IU] via SUBCUTANEOUS

## 2017-01-29 MED ORDER — ORAL CARE MOUTH RINSE
15.0000 mL | Freq: Four times a day (QID) | OROMUCOSAL | Status: DC
  Administered 2017-01-29 (×2): 15 mL via OROMUCOSAL

## 2017-01-29 MED ORDER — CHLORHEXIDINE GLUCONATE 0.12% ORAL RINSE (MEDLINE KIT)
15.0000 mL | Freq: Two times a day (BID) | OROMUCOSAL | Status: DC
  Administered 2017-01-29 – 2017-01-30 (×2): 15 mL via OROMUCOSAL

## 2017-01-29 MED ORDER — MAGNESIUM SULFATE 2 GM/50ML IV SOLN
2.0000 g | Freq: Once | INTRAVENOUS | Status: AC
  Administered 2017-01-29: 2 g via INTRAVENOUS
  Filled 2017-01-29: qty 50

## 2017-01-29 NOTE — Evaluation (Signed)
Physical Therapy Evaluation Patient Details Name: Enmanuel Zufall MRN: [id number] DOB: [birth year] Today's Date: 01/29/2017   History of Present Illness  Darrell Black is a 61 y.o. male with medical history significant of hx of afib, CAD, hx CABG/ subsequent stents, combined s/d CHF, HL, DM, HTN, ICM, depression, morbid obesity, OSA , AICD in place and CKD who was recently admitted from 11/5-14 for heart failure (EF 35-40%) and acute on chronic respiratory failure with hypercapnia.  He is back with respiratory distress and is quite somnolent.  He reports that it is "Hard to breathe".  He denies any pain.  Wears home O2 at night but was up to 10L in the ER at the time of my evaluation.  He was unable to effectively answer questions.    Clinical Impression  Patient able to take a few side steps at bedside, limited mostly due to SOB and c/o fatigue.  Patient will benefit from continued physical therapy in hospital and recommended venue below to increase strength, balance, endurance for safe ADLs and gait.    Follow Up Recommendations SNF;Supervision/Assistance - 24 hour    Equipment Recommendations  None recommended by PT    Recommendations for Other Services       Precautions / Restrictions Precautions Precautions: Fall Restrictions Weight Bearing Restrictions: No      Mobility  Bed Mobility Overal bed mobility: Needs Assistance Bed Mobility: Supine to Sit;Sit to Supine     Supine to sit: Min guard Sit to supine: Min assist;Mod assist   General bed mobility comments: required assistance to move BLE onto bed during sit to supine  Transfers Overall transfer level: Needs assistance Equipment used: Rolling walker (2 wheeled) Transfers: Sit to/from Stand Sit to Stand: Min guard            Ambulation/Gait Ambulation/Gait assistance: Mod assist Ambulation Distance (Feet): 2 Feet Assistive device: Rolling walker (2 wheeled)     Gait velocity interpretation: Below normal  speed for age/gender General Gait Details: Patient limited to 4-5 side steps at bedside due to c/o weakness/fatigue  Stairs            Wheelchair Mobility    Modified Rankin (Stroke Patients Only)       Balance Overall balance assessment: Needs assistance Sitting-balance support: Feet supported;No upper extremity supported Sitting balance-Leahy Scale: Good     Standing balance support: Bilateral upper extremity supported;During functional activity Standing balance-Leahy Scale: Fair                               Pertinent Vitals/Pain Pain Assessment: No/denies pain    Home Living Family/patient expects to be discharged to:: Private residence Living Arrangements: Children(daughter) Available Help at Discharge: Family Type of Home: House Home Access: Stairs to enter Entrance Stairs-Rails: None(patient states rails cannot support him) Technical brewer of Steps: 3 Home Layout: Two level Home Equipment: [place] - 4 wheels;Wheelchair - manual Additional Comments: recently admitted to hospital transferred to SNF for rehab    Prior Function Level of Independence: Independent with assistive device(s)   Gait / Transfers Assistance Needed: Ambulated with Rollator   ADL's / Homemaking Assistance Needed: daughter helps        Hand Dominance   Dominant Hand: Right    Extremity/Trunk Assessment   Upper Extremity Assessment Upper Extremity Assessment: Defer to OT evaluation    Lower Extremity Assessment Lower Extremity Assessment: Generalized weakness    Cervical / Trunk Assessment Cervical /  Trunk Assessment: Normal  Communication   Communication: No difficulties  Cognition Arousal/Alertness: Awake/alert Behavior During Therapy: WFL for tasks assessed/performed Overall Cognitive Status: Within Functional Limits for tasks assessed                                        General Comments      Exercises     Assessment/Plan     PT Assessment Patient needs continued PT services  PT Problem List Decreased strength;Decreased activity tolerance;Decreased balance;Decreased mobility       PT Treatment Interventions Gait training;Functional mobility training;Therapeutic activities;Therapeutic exercise;Neuromuscular re-education    PT Goals (Current goals can be found in the Care Plan section)  Acute Rehab PT Goals Patient Stated Goal: return home after rehab PT Goal Formulation: With patient Time For Goal Achievement: 02/19/17 Potential to Achieve Goals: Good    Frequency Min 3X/week   Barriers to discharge        Co-evaluation               AM-PAC PT "6 Clicks" Daily Activity  Outcome Measure Difficulty turning over in bed (including adjusting bedclothes, sheets and blankets)?: None Difficulty moving from lying on back to sitting on the side of the bed? : A Little Difficulty sitting down on and standing up from a chair with arms (e.g., wheelchair, bedside commode, etc,.)?: A Little Help needed moving to and from a bed to chair (including a wheelchair)?: A Little Help needed walking in hospital room?: A Lot Help needed climbing 3-5 steps with a railing? : A Lot 6 Click Score: 17    End of Session   Activity Tolerance: Patient tolerated treatment well;Patient limited by fatigue Patient left: in bed;with call bell/phone within reach;with bed alarm set Nurse Communication: Mobility status PT Visit Diagnosis: Unsteadiness on feet (R26.81);Other abnormalities of gait and mobility (R26.89);Muscle weakness (generalized) (M62.81)    Time: 1610-9604 PT Time Calculation (min) (ACUTE ONLY): 25 min   Charges:   PT Evaluation $PT Eval Moderate Complexity: 1 Mod PT Treatments $Therapeutic Activity: 8-22 mins   PT G Codes:   PT G-Codes **NOT FOR INPATIENT CLASS** Functional Assessment Tool Used: AM-PAC 6 Clicks Basic Mobility Functional Limitation: Mobility: Walking and moving around Mobility:  Walking and Moving Around Current Status (V4098): At least 40 percent but less than 60 percent impaired, limited or restricted Mobility: Walking and Moving Around Goal Status [phone]): At least 40 percent but less than 60 percent impaired, limited or restricted Mobility: Walking and Moving Around Discharge Status [phone]): At least 40 percent but less than 60 percent impaired, limited or restricted    1:35 PM, 01/29/17 Lonell Grandchild, MPT Physical Therapist with [facility] 336 [phone] office [phone] mobile phone

## 2017-01-29 NOTE — Progress Notes (Signed)
OT Cancellation Note  Patient Details Name: Darrell Black MRN: [id number] DOB: February 16, 1956   Cancelled Treatment:    Reason Eval/Treat Not Completed: OT screened, no needs identified, will sign off. Pt screened for OT needs, OT is familiar with pt from most recent hospitalization. No changes in level of assistance required for ADL completion, pt continues to require increased level of assistance due to SOB, weakness, and fatigue. Pt able to complete feeding and grooming tasks at bed level and in sitting with set-up, mod to max assist required for dressing/bathing/toileting tasks. Pt has a history of being limited with mobility tasks, has been receiving rehab services at [facility] limited to stand-pivot transfers and functional mobility of a few feet per pt report. Pt is currently at his most recent baseline with ADL completion, limited by cardiopulmonary status and weakness. At this time, recommend resumption of rehab services at [facility] or transitioning to LTC dependent upon pt progression during current hospitalization.   [name], OTR/L  [phone] 01/29/2017, 9:03 AM

## 2017-01-29 NOTE — ED Provider Notes (Signed)
[place]  Department of Emergency Medicine   Code Blue CONSULT NOTE  Chief Complaint: Cardiac arrest/unresponsive   Level V Caveat: Unresponsive  History of present illness: I was contacted by the hospital for a CODE BLUE cardiac arrest upstairs and presented to the patient's bedside.   She had been admitted by myself yesterday for hypoxic restaurant failure thought to be secondary to noncompliance and not wearing BiPAP at night.  Apparently patient was doing well this morning on BiPAP had said he went to be a full code and then patient had a relatively quick decline interest with relatively normal blood pressures so CODE BLUE was called.  On my arrival patient was pulseless apneic receiving chest compressions and not received any epinephrine yet.  ROS: Unable to obtain, Level V caveat  Scheduled Meds: . amiodarone  200 mg Oral Daily  . apixaban  5 mg Oral BID  . aspirin EC  81 mg Oral Daily  . atorvastatin  80 mg Oral QPM  . carvedilol  3.125 mg Oral BID WC  . chlorhexidine gluconate (MEDLINE KIT)  15 mL Mouth Rinse BID  . furosemide  80 mg Intravenous Q8H  . gabapentin  100 mg Oral BID  . insulin aspart  0-15 Units Subcutaneous TID WC  . insulin aspart  0-5 Units Subcutaneous QHS  . ipratropium-albuterol  3 mL Nebulization Q6H  . mouth rinse  15 mL Mouth Rinse BID  . mouth rinse  15 mL Mouth Rinse QID  . pantoprazole (PROTONIX) IV  40 mg Intravenous Daily  . potassium chloride  40 mEq Oral Daily  . senna  1 tablet Oral QHS  . sodium chloride flush  3 mL Intravenous Q12H   Continuous Infusions: . sodium chloride     PRN Meds:.sodium chloride, acetaminophen, fentaNYL (SUBLIMAZE) injection, fentaNYL (SUBLIMAZE) injection, midazolam, midazolam, ondansetron (ZOFRAN) IV, sodium chloride flush Past Medical History:  Diagnosis Date  . Atherosclerosis   . Atrial fibrillation ([place])   . 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 Chest pain    . Chronic combined systolic and diastolic heart failure ([place])    a. 12/2016: echo showing EF of 35-40% , diffuse HK, mild TR, and PA pressure of 38 mm Hg  . Chronic pain   . Coronary artery disease    a. s/p CABG in 2001  . 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 Hyperlipidemia   . Hypertension    essential  . Ischemic cardiomyopathy   . Major depressive disorder, recurrent ([place])   . Morbid obesity due to excess calories ([place])   . Muscle weakness (generalized)   . Neuropathy   . Obstructive sleep apnea   . Presence of automatic cardioverter/defibrillator (AICD)   . Renal disorder    Chronic kidney disease  . Restless leg syndrome   . Sick-euthyroid syndrome    Past Surgical History:  Procedure Laterality Date  . COLONOSCOPY N/A 12/20/2016   Procedure: COLONOSCOPY;  Surgeon: Mauri Pole, MD;  Location: [facility] ENDOSCOPY;  Service: Endoscopy;  Laterality: N/A;   Social History   Socioeconomic History  . Marital status: Single    Spouse name: Not on file  . Number of children: Not on file  . Years of education: Not on file  . Highest education level: Not on file  Social Needs  . Financial resource strain: Not on file  . Food insecurity - worry: Not on file  . Food insecurity -  inability: Not on file  . Transportation needs - medical: Not on file  . Transportation needs - non-medical: Not on file  Occupational History  . Not on file  Tobacco Use  . Smoking status: Never Smoker  . Smokeless tobacco: Never Used  Substance and Sexual Activity  . Alcohol use: No  . Drug use: No  . Sexual activity: No  Other Topics Concern  . Not on file  Social History Narrative  . Not on file   Allergies  Allergen Reactions  . Fish Allergy   . Ampicillin Rash  . Clindamycin/Lincomycin Rash  . Penicillins Rash    Has patient had a PCN reaction causing immediate rash, facial/tongue/throat swelling, SOB or lightheadedness with hypotension: Yes Has patient  had a PCN reaction causing severe rash involving mucus membranes or skin necrosis: No Has patient had a PCN reaction that required hospitalization: No Has patient had a PCN reaction occurring within the last 10 years: No If all of the above answers are "NO", then may proceed with Cephalosporin use.     Last set of Vital Signs (not current) Vitals:   01/29/17 1122 01/29/17 1130  BP:  (!) 87/57  Pulse: 70 70  Resp: 20 (!) 28  Temp: 97.8 F (36.6 C)   SpO2: 95% 94%      Physical Exam  Gen: unresponsive Cardiovascular: pulseless  Resp: apneic. Breath sounds equal bilaterally with bagging  Abd: nondistended  Neuro: GCS 3, unresponsive to pain  HEENT: No blood in posterior pharynx, gag reflex absent  Neck: No crepitus  Musculoskeletal: No deformity  Skin: warm  Procedures  INTUBATION Performed by: Merrily Pew Required items: required blood products, implants, devices, and special equipment available Patient identity confirmed: provided demographic data and hospital-assigned identification number Time out: Immediately prior to procedure a "time out" was called to verify the correct patient, procedure, equipment, support staff and site/side marked as required. Indications: apneic, respiratory arrest Intubation method: glidescope Preoxygenation: BVM Sedatives: none Paralytic: none Tube Size: 7.5 cuffed Post-procedure assessment: chest rise and ETCO2 monitor Breath sounds: equal and absent over the epigastrium Tube secured by Respiratory Therapy Patient tolerated the procedure well with no immediate complications.  CRITICAL CARE Performed by: Merrily Pew Total critical care time: 35 Critical care time was exclusive of separately billable procedures and treating other patients. Critical care was necessary to treat or prevent imminent or life-threatening deterioration. Critical care was time spent personally by me on the following activities: development of treatment plan  with patient and/or surrogate as well as nursing, discussions with consultants, evaluation of patient's response to treatment, examination of patient, obtaining history from patient or surrogate, ordering and performing treatments and interventions, ordering and review of laboratory studies, ordering and review of radiographic studies, pulse oximetry and re-evaluation of patient's condition.  Cardiopulmonary Resuscitation (CPR) Procedure Note Directed/Performed by: Merrily Pew I personally directed ancillary staff and/or performed CPR in an effort to regain return of spontaneous circulation and to maintain cardiac, neuro and systemic perfusion.    Medical Decision making  Suspect likely hypoxic respiratory rest.  Patient with spontaneous return of circulation after saturations got above 90%.  Blood pressures were soft but near his baseline.  Care handed off to ICU team.   Merrily Pew, MD 01/29/17 [phone]

## 2017-01-29 NOTE — Consult Note (Signed)
Consult requested by: [facility] hospitalist,Dr. Dyann Kief Consult requested for: Respiratory failure  HPI: This is a 61 year old with multiple medical problems and who was in the hospital earlier this month with respiratory failure from congestive heart failure.  He was discharged back to his skilled care facility on the 14th of this month and then developed increasing shortness of breath.  He came to the emergency department where he was found to be very somnolent he was on 10 L and really not able to give any history.  He is still not able to give any history because he is on BiPAP.  At baseline he is known to have atrial fib coronary artery disease, coronary artery bypass grafting, combined systolic and diastolic heart failure, diabetes, hypertension, depression, morbid obesity, obstructive sleep apnea AICD in place and chronic kidney disease.  He was placed on BiPAP in the emergency department.  Chest x-ray etc. looks like recurrent heart failure.  Past Medical History:  Diagnosis Date  . Atherosclerosis   . Atrial fibrillation ([place])   . 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 Chest pain   . Chronic combined systolic and diastolic heart failure ([place])    a. 12/2016: echo showing EF of 35-40% , diffuse HK, mild TR, and PA pressure of 38 mm Hg  . Chronic pain   . Coronary artery disease    a. s/p CABG in 2001  . 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 Hyperlipidemia   . Hypertension    essential  . Ischemic cardiomyopathy   . Major depressive disorder, recurrent ([place])   . Morbid obesity due to excess calories ([place])   . Muscle weakness (generalized)   . Neuropathy   . Obstructive sleep apnea   . Presence of automatic cardioverter/defibrillator (AICD)   . Renal disorder    Chronic kidney disease  . Restless leg syndrome   . Sick-euthyroid syndrome      Family History  Problem Relation Age of Onset  . Hypertension Father   . Cancer Maternal  Grandmother   . Alcohol abuse Maternal Grandfather      Social History   Socioeconomic History  . Marital status: Single    Spouse name: None  . Number of children: None  . Years of education: None  . Highest education level: None  Social Needs  . Financial resource strain: None  . Food insecurity - worry: None  . Food insecurity - inability: None  . Transportation needs - medical: None  . Transportation needs - non-medical: None  Occupational History  . None  Tobacco Use  . Smoking status: Never Smoker  . Smokeless tobacco: Never Used  Substance and Sexual Activity  . Alcohol use: No  . Drug use: No  . Sexual activity: No  Other Topics Concern  . None  Social History Narrative  . None     ROS: Unable to obtain    Objective: Vital signs in last 24 hours: Temp:  [93.7 F (34.3 C)-98 F (36.7 C)] 93.7 F (34.3 C) (11/20 0727) Pulse Rate:  [69-70] 70 (11/20 0727) Resp:  [12-30] 25 (11/20 0727) BP: (70-120)/(39-84) 117/71 (11/20 0600) SpO2:  [86 %-100 %] 97 % (11/20 0804) FiO2 (%):  [50 %] 50 % (11/20 0804) Weight:  [150.6 kg (332 lb)-156.9 kg (345 lb 14.4 oz)] 156.9 kg (345 lb 14.4 oz) (11/20 0402) Weight change:     Intake/Output from previous day: 11/19 0701 - 11/20 0700 In: -  Out: 500 [Urine:500]  PHYSICAL EXAM Constitutional: He is obese and on BiPAP.  Eyes: Pupils react EOMI.  Ears nose mouth and throat unable to fully assess because of being on BiPAP.  His hearing is grossly normal.  Cardiovascular: His heart is regular and by monitor is paced.  He has edema of the legs.  Respiratory: He has rhonchi and some wheezing bilaterally.  Gastrointestinal: His abdomen is soft obese with no masses.  Skin: Warm and dry.  Musculoskeletal: Unable to fully assess but he seems to have normal strength.  Neurological: No focal abnormalities.  Psychiatric: Unable to fully assess.  Lab Results: Basic Metabolic Panel: Recent Labs    01/10/2017 1256 01/29/17 0454  NA  133* 135  K 3.7 3.4*  CL 77* 78*  CO2 45* 44*  GLUCOSE 103* 106*  BUN 90* 93*  CREATININE 3.46* 3.49*  CALCIUM 8.0* 8.0*   Liver Function Tests: Recent Labs    01/16/2017 1256  AST 22  ALT 18  ALKPHOS 132*  BILITOT 1.4*  PROT 6.4*  ALBUMIN 3.5   No results for input(s): LIPASE, AMYLASE in the last 72 hours. No results for input(s): AMMONIA in the last 72 hours. CBC: Recent Labs    01/14/2017 1256 01/29/17 0454  WBC 6.3 5.6  NEUTROABS 5.1 4.5  HGB 9.7* 10.1*  HCT 33.4* 35.4*  MCV 101.2* 102.9*  PLT 149* 134*   Cardiac Enzymes: Recent Labs    02/03/2017 1316  TROPONINI <0.03   BNP: No results for input(s): PROBNP in the last 72 hours. D-Dimer: No results for input(s): DDIMER in the last 72 hours. CBG: Recent Labs    01/11/2017 1305  GLUCAP 110*   Hemoglobin A1C: No results for input(s): HGBA1C in the last 72 hours. Fasting Lipid Panel: No results for input(s): CHOL, HDL, LDLCALC, TRIG, CHOLHDL, LDLDIRECT in the last 72 hours. Thyroid Function Tests: No results for input(s): TSH, T4TOTAL, FREET4, T3FREE, THYROIDAB in the last 72 hours. Anemia Panel: No results for input(s): VITAMINB12, FOLATE, FERRITIN, TIBC, IRON, RETICCTPCT in the last 72 hours. Coagulation: No results for input(s): LABPROT, INR in the last 72 hours. Urine Drug Screen: Drugs of Abuse     Component Value Date/Time   LABOPIA NONE DETECTED 12/10/2016 2120   COCAINSCRNUR NONE DETECTED 12/10/2016 2120   LABBENZ NONE DETECTED 12/10/2016 2120   AMPHETMU NONE DETECTED 12/10/2016 2120   THCU NONE DETECTED 12/10/2016 2120   LABBARB NONE DETECTED 12/10/2016 2120    Alcohol Level: No results for input(s): ETH in the last 72 hours. Urinalysis: Recent Labs    01/11/2017 1253  [place]  LABSPEC 1.012  PHURINE 5.0  GLUCOSEU NEGATIVE  HGBUR NEGATIVE  BILIRUBINUR NEGATIVE  KETONESUR NEGATIVE  PROTEINUR NEGATIVE  NITRITE NEGATIVE  LEUKOCYTESUR NEGATIVE   Misc.  Labs:   ABGS: Recent Labs    02/01/2017 1310  HCO3 42.1*     MICROBIOLOGY: No results found for this or any previous visit (from the past 240 hour(s)).  Studies/Results: Dg Chest Port 1 View  Result Date: 01/25/2017 CLINICAL DATA:  Respiratory distress. History of atrial fibrillation, CHF, coronary artery disease, nonsmoker. EXAM: PORTABLE CHEST 1 VIEW COMPARISON:  Portable chest x-ray of January 17, 2017 FINDINGS: The lungs remain hypoinflated which is accentuated by the lordotic positioning. The interstitial markings remain increased. The pulmonary vascularity remains engorged and the cardiac silhouette enlarged. The ICD is in stable position. There is a probable small right pleural effusion. IMPRESSION: CHF with pulmonary interstitial edema. There has  been slight deterioration since the previous study. Electronically Signed   By: David  Martinique M.D.   On: 01/14/2017 14:11    Medications:  Prior to Admission:  Medications Prior to Admission  Medication Sig Dispense Refill Last Dose  . acetaminophen (TYLENOL) 325 MG tablet Take 650 mg by mouth every 6 (six) hours as needed.   unknown  . amiodarone (PACERONE) 200 MG tablet Take 200 mg by mouth daily.   01/19/2017 at Unknown time  . apixaban (ELIQUIS) 5 MG TABS tablet Take 1 tablet (5 mg total) by mouth 2 (two) times daily. 60 tablet  01/22/2017 at Unknown time  . atorvastatin (LIPITOR) 80 MG tablet Take 80 mg by mouth every evening.   01/27/2017 at Unknown time  . carvedilol (COREG) 3.125 MG tablet Take 3.125 mg by mouth 2 (two) times daily with a meal.   01/29/2017 at Unknown time  . docusate sodium (COLACE) 100 MG capsule Take 100 mg by mouth daily.   01/19/2017 at Unknown time  . gabapentin (NEURONTIN) 100 MG capsule Take 1 capsule (100 mg total) by mouth 2 (two) times daily.   01/10/2017 at Unknown time  . guaiFENesin (ROBITUSSIN) 100 MG/5ML SOLN Take 5 mLs every 8 (eight) hours as needed by mouth for cough or to loosen phlegm.    unknown  . ipratropium-albuterol (DUONEB) 0.5-2.5 (3) MG/3ML SOLN Take 3 mLs every 6 (six) hours as needed by nebulization (shortness of breath).   unknown  . metolazone (ZAROXOLYN) 5 MG tablet Take 1 tablet (5 mg total) every other day by mouth.   unknown  . nitroGLYCERIN (NITROSTAT) 0.4 MG SL tablet Place 0.4 mg under the tongue every 5 (five) minutes as needed for chest pain.   unknown  . rOPINIRole (REQUIP) 1 MG tablet Take 1 mg by mouth at bedtime.   01/27/2017 at Unknown time  . senna (SENOKOT) 8.6 MG TABS tablet Take 1 tablet by mouth at bedtime.   01/27/2017 at Unknown time  . tiotropium (SPIRIVA) 18 MCG inhalation capsule Place 18 mcg into inhaler and inhale daily.   01/27/2017 at Unknown time  . torsemide (DEMADEX) 100 MG tablet Take 1 tablet (100 mg total) daily by mouth.   01/26/2017 at Unknown time   Scheduled: . amiodarone  200 mg Oral Daily  . apixaban  5 mg Oral BID  . aspirin EC  81 mg Oral Daily  . atorvastatin  80 mg Oral QPM  . carvedilol  3.125 mg Oral BID WC  . furosemide  60 mg Intravenous Q6H  . gabapentin  100 mg Oral BID  . insulin aspart  0-15 Units Subcutaneous TID WC  . insulin aspart  0-5 Units Subcutaneous QHS  . ipratropium-albuterol  3 mL Nebulization Q6H  . mouth rinse  15 mL Mouth Rinse BID  . senna  1 tablet Oral QHS  . sodium chloride flush  3 mL Intravenous Q12H   Continuous: . sodium chloride     ERX:VQMGQQ chloride, acetaminophen, ondansetron (ZOFRAN) IV, sodium chloride flush  Assesment: He has acute on chronic hypercapnic and hypoxic respiratory failure.  He was discharged from the hospital on BiPAP which he said he was going to use at the skilled care facility but I cannot get information from him now about whether he actually did that.  He has sleep apnea and obesity hypoventilation.  He has acute on chronic renal failure.  He has acute on chronic combined systolic and diastolic heart failure.  He has hypothermia and is on  the bear hugger.  He  is more alert and probably can come off of BiPAP for now.  It does not look like he has had much diuresis from his current Lasix and the last time he required Lasix continuous infusion so that may be necessary again.  He agreed it makes sense to go ahead with palliative care consultation for goal setting considering his very quick return from previous hospitalization. 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  Apply BiPAP by clinical grounds.  I do not think he needs blood gas necessarily for that.  Continue diuresis.  Consider intravenous Lasix drip.  Since I think he will be able to come off of BiPAP at least periodically I did order a diet.  Considering his deterioration he is high risk for requiring intubation and mechanical ventilation.  He may need nephrology consultation because of his acute renal failure    LOS: 1 day   Jaline Pincock L 01/29/2017, 8:10 AM

## 2017-01-29 NOTE — Progress Notes (Signed)
Informed Dr. Dyann Kief that patient's temperature was 97.66F orally. He said it was okay to take him off the bair hugger. Bair hugger was removed from patient.

## 2017-01-29 NOTE — Consult Note (Signed)
Consultation Note Date: 01/29/2017   Patient Name: Darrell Black  DOB: Jul 21, 1955  MRN: [id number]  Age / Sex: 61 y.o., male  PCP: Caprice Renshaw, MD Referring Physician: Barton Dubois, MD  Reason for Consultation: Establishing goals of care and Psychosocial/spiritual support  HPI/Patient Profile: 61 y.o. male  with past medical history of atrial fibrillation, coronary artery disease, CABG with subsequent stents, combined heart failure, diabetes, hypertension, morbid obesity, obstructive sleep apnea, AICD in place, history of chronic kidney disease, depression admitted on 01/15/2017 with acute on chronic respiratory failure, thought to be related to heart failure exacerbation.   Clinical Assessment and Goals of Care: Darrell Black "Darrell Black", is resting quietly in bed, getting his breakfast ready.  He will make an briefly keep eye contact.  There is no family at bedside at this time.  We talk about his psychosocial history.  He shares that he was an Network engineer in [place], never married no children, 72 year old grandmother who lives in [place].  He shares that his parents are both deceased, but he has a brother, Darrell Black who lives in [place].  Darrell Black states that he moved to [place] about 6 months ago with his niece, Darrell Black.  He states that they were living in [place] then, but he moved to [facility].  He is somewhat unclear about this whole process.  We talked about Bob's functional status.  He shares that he has worked with PT today, and that he was able to pivot.  I ask when was the last time he walked.  Darrell Black ask do you mean like 150 feet?  He states that that was several months ago.  I share a diagram of the chronic illness pathway, which is normal and expected.  We talked about preparing for what is next.  Darrell Black states that his goal at this point, is to return to [place].  He states that he  feels that he has "come a long way" (towards improvement) in the last 6 hours.   We talked about CODE STATUS, and the concept of treat the treatable, but no CPR no intubation.  Darrell Black states that he has been intubated in the past, but does not remember much about this.  He states that he also has an automatic defibrillator, and has been shocked in the past.  He states at this point, he wants all measures.  We talked about healthcare power of attorney.  He states that he would like for his niece, Darrell Black to be a Air traffic controller, but that "in the end" decisions would fall to his brother Darrell Black, for whom he has no phone number.  Darrell Black declines for me to call his niece, stating that she is at work, and he does not believe she has Darrell Black's phone number.  Health care power of attorney NEXT OF KIN -we discussed healthcare power of attorney paperwork.  Darrell Black states that his surrogate decision-maker would be his niece, Darrell Black, who lives in [place].  Phone # [phone].   He goes  further to tell me that his brother, Darrell Black, who lives in [place], that would be the final Media planner.  Darrell Black shares that he does not have his brother Darrell Black's phone number.  He declines for me to call Darrell Black or Darrell Black to ask for Darrell Black's phone number.   Darrell Black has never married, has no children.  Both of his parents are deceased, he has a 66 year old grandmother living in [place]   At this point, full scope of treatment. Return to Darrell Black, residential SNF.   Continue HCPOA/CODE STATUS discussions.  Code Status/Advance Care Planning:  Full code -we discussed the concept of treat the treatable, but no CPR, no intubation.  Darrell Black states that he has been intubated in the past, but does not remember this.  Symptom Management:   Per hospitalist, no additional needs at this time.  Palliative Prophylaxis:   Turn Reposition  Additional Recommendations (Limitations,  Scope, Preferences):  Full Scope Treatment  Psycho-social/Spiritual:   Desire for further Chaplaincy support:no  Additional Recommendations: None at this time, residential SNF, needs met.  Prognosis:   < 12 months, would not be surprising based on extensive cardiac history, obesity, functional decline, 3 hospitalizations in the last 6 months, residential SNF status.  Discharge Planning: Return to residential SNF, [facility].       Primary Diagnoses: Present on Admission: . Acute on chronic combined systolic and diastolic CHF (congestive heart failure) ([place]) . Acute on chronic respiratory failure with hypercapnia ([place]) . 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 Hypertension . Obstructive sleep apnea   I have reviewed the medical record, interviewed the patient and family, and examined the patient. The following aspects are pertinent.  Past Medical History:  Diagnosis Date  . Atherosclerosis   . Atrial fibrillation ([place])   . 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 Chest pain   . Chronic combined systolic and diastolic heart failure ([place])    a. 12/2016: echo showing EF of 35-40% , diffuse HK, mild TR, and PA pressure of 38 mm Hg  . Chronic pain   . Coronary artery disease    a. s/p CABG in 2001  . 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 Hyperlipidemia   . Hypertension    essential  . Ischemic cardiomyopathy   . Major depressive disorder, recurrent (Darrell Black)   . Morbid obesity due to excess calories ([place])   . Muscle weakness (generalized)   . Neuropathy   . Obstructive sleep apnea   . Presence of automatic cardioverter/defibrillator (AICD)   . Renal disorder    Chronic kidney disease  . Restless leg syndrome   . Sick-euthyroid syndrome    Social History   Socioeconomic History  . Marital status: Single    Spouse name: None  . Number of children: None  . Years  of education: None  . Highest education level: None  Social Needs  . Financial resource strain: None  . Food insecurity - worry: None  . Food insecurity - inability: None  . Transportation needs - medical: None  . Transportation needs - non-medical: None  Occupational History  . None  Tobacco Use  . Smoking status: Never Smoker  . Smokeless tobacco: Never Used  Substance and Sexual Activity  . Alcohol use: No  . Drug use: No  . Sexual activity: No  Other Topics Concern  . None  Social  History Narrative  . None   Family History  Problem Relation Age of Onset  . Hypertension Father   . Cancer Maternal Grandmother   . Alcohol abuse Maternal Grandfather    Scheduled Meds: . amiodarone  200 mg Oral Daily  . apixaban  5 mg Oral BID  . aspirin EC  81 mg Oral Daily  . atorvastatin  80 mg Oral QPM  . carvedilol  3.125 mg Oral BID WC  . furosemide  80 mg Intravenous Q6H  . gabapentin  100 mg Oral BID  . insulin aspart  0-15 Units Subcutaneous TID WC  . insulin aspart  0-5 Units Subcutaneous QHS  . ipratropium-albuterol  3 mL Nebulization Q6H  . mouth rinse  15 mL Mouth Rinse BID  . potassium chloride  40 mEq Oral Daily  . senna  1 tablet Oral QHS  . sodium chloride flush  3 mL Intravenous Q12H   Continuous Infusions: . sodium chloride     PRN Meds:.sodium chloride, acetaminophen, ondansetron (ZOFRAN) IV, sodium chloride flush Medications Prior to Admission:  Prior to Admission medications   Medication Sig Start Date End Date Taking? Authorizing Provider  acetaminophen (TYLENOL) 325 MG tablet Take 650 mg by mouth every 6 (six) hours as needed.   Yes [provider]  amiodarone (PACERONE) 200 MG tablet Take 200 mg by mouth daily.   Yes [provider]  apixaban (ELIQUIS) 5 MG TABS tablet Take 1 tablet (5 mg total) by mouth 2 (two) times daily. 12/31/16  Yes Isaac Bliss, Rayford Halsted, MD  atorvastatin (LIPITOR) 80 MG tablet Take 80 mg by mouth every  evening.   Yes [provider]  carvedilol (COREG) 3.125 MG tablet Take 3.125 mg by mouth 2 (two) times daily with a meal.   Yes [provider]  docusate sodium (COLACE) 100 MG capsule Take 100 mg by mouth daily.   Yes [provider]  gabapentin (NEURONTIN) 100 MG capsule Take 1 capsule (100 mg total) by mouth 2 (two) times daily. 12/31/16  Yes Isaac Bliss, Rayford Halsted, MD  guaiFENesin (ROBITUSSIN) 100 MG/5ML SOLN Take 5 mLs every 8 (eight) hours as needed by mouth for cough or to loosen phlegm.   Yes [provider]  ipratropium-albuterol (DUONEB) 0.5-2.5 (3) MG/3ML SOLN Take 3 mLs every 6 (six) hours as needed by nebulization (shortness of breath).   Yes [provider]  metolazone (ZAROXOLYN) 5 MG tablet Take 1 tablet (5 mg total) every other day by mouth. 01/23/17  Yes Isaac Bliss, Rayford Halsted, MD  nitroGLYCERIN (NITROSTAT) 0.4 MG SL tablet Place 0.4 mg under the tongue every 5 (five) minutes as needed for chest pain.   Yes [provider]  rOPINIRole (REQUIP) 1 MG tablet Take 1 mg by mouth at bedtime.   Yes [provider]  senna (SENOKOT) 8.6 MG TABS tablet Take 1 tablet by mouth at bedtime.   Yes [provider]  tiotropium (SPIRIVA) 18 MCG inhalation capsule Place 18 mcg into inhaler and inhale daily.   Yes [provider]  torsemide (DEMADEX) 100 MG tablet Take 1 tablet (100 mg total) daily by mouth. 01/24/17  Yes Isaac Bliss, Rayford Halsted, MD   Allergies  Allergen Reactions  . Fish Allergy   . Ampicillin Rash  . Clindamycin/Lincomycin Rash  . Penicillins Rash    Has patient had a PCN reaction causing immediate rash, facial/tongue/throat swelling, SOB or lightheadedness with hypotension: Yes Has patient had a PCN reaction causing severe rash  involving mucus membranes or skin necrosis: No Has patient had a PCN reaction that required hospitalization: No Has patient had a PCN reaction occurring within  the last 10 years: No If all of the above answers are "NO", then may proceed with Cephalosporin use.    Review of Systems  Unable to perform ROS: Mental status change    Physical Exam  Constitutional: He is oriented to person, place, and time. No distress.  Obese, makes and briefly keeps eye contact.   HENT:  Head: Atraumatic.  Cardiovascular: Normal rate.  Pulmonary/Chest: Effort normal. No respiratory distress.  Abdominal: Soft.  obese  Musculoskeletal: He exhibits edema.  Neurological: He is alert and oriented to person, place, and time.  Skin: Skin is warm and dry.  Nursing note and vitals reviewed.   Vital Signs: BP 97/76 (BP Location: Left Arm)   Pulse 70   Temp 97.8 F (36.6 C) (Oral)   Resp 20   Ht 5' 8"  (1.727 m)   Wt (!) 156.9 kg (345 lb 14.4 oz)   SpO2 95%   BMI 52.59 kg/m  Pain Assessment: 0-10   Pain Score: 6    SpO2: SpO2: 95 % O2 Device:SpO2: 95 % O2 Flow Rate: .O2 Flow Rate (L/min): 8 L/min  IO: Intake/output summary:   Intake/Output Summary (Last 24 hours) at 01/29/2017 1152 Last data filed at 01/29/2017 0500 Gross per 24 hour  Intake -  Output 500 ml  Net -500 ml    LBM: Last BM Date: 01/27/17 Baseline Weight: Weight: (!) 150.6 kg (332 lb) Most recent weight: Weight: (!) 156.9 kg (345 lb 14.4 oz)     Palliative Assessment/Data:   Flowsheet Rows     Most Recent Value  Intake Tab  Referral Department  Hospitalist  Unit at Time of Referral  ICU  Palliative Care Primary Diagnosis  Cardiac  Date Notified  01/29/17  Palliative Care Type  New Palliative care  Reason for referral  Clarify Goals of Care  Date of Admission  01/11/2017  Date first seen by Palliative Care  01/29/17  # of days Palliative referral response time  0 Day(s)  # of days IP prior to Palliative referral  1  Clinical Assessment  Palliative Performance Scale Score  40%  Pain Max last 24 hours  Not able to report  Pain Min Last 24 hours  Not able to report  Dyspnea  Max Last 24 Hours  Not able to report  Dyspnea Min Last 24 hours  Not able to report  Psychosocial & Spiritual Assessment  Palliative Care Outcomes  Patient/Family meeting held?  Yes  Who was at the meeting?  patient at bedside.   Palliative Care Outcomes  Clarified goals of care, Provided psychosocial or spiritual support, Provided advance care planning      Time In: 0930 Time Out: 1020 Time Total: 50 minutes Greater than 50%  of this time was spent counseling and coordinating care related to the above assessment and plan.  Signed by: Drue Novel, NP   Please contact Palliative Medicine Team phone at [phone] for questions and concerns.  For individual provider: See Shea Evans

## 2017-01-29 NOTE — Progress Notes (Signed)
He suffered cardiopulmonary arrest.  He has responded and he is now intubated.  Labs etc. are pending.

## 2017-01-29 NOTE — Progress Notes (Signed)
RT removed patient from BIPAP and pklaced on 7L HFNC, patient maintaining SATs 93%. RT will continue to monitor.

## 2017-01-29 NOTE — Progress Notes (Signed)
Lab called with a critical lactic acid of 2.0. I texted paged Dr. Dyann Kief to inform him.

## 2017-01-29 NOTE — Progress Notes (Signed)
Patient had a rectal temperature of 93.7. Informed Dr. Dyann Kief and he ordered a bear hugger to be applied. Received and applied bear hugger to patient. Will monitor patient's temperature.

## 2017-01-29 NOTE — Plan of Care (Signed)
  Acute Rehab PT Goals(only PT should resolve) Pt Will Go Supine/Side To Sit 01/29/2017 1337 - Progressing by Lonell Grandchild, PT Flowsheets Taken 01/29/2017 1337  Pt will go Supine/Side to Sit with supervision Pt Will Go Sit To Supine/Side 01/29/2017 1337 - Progressing by Lonell Grandchild, PT Flowsheets Taken 01/29/2017 1337  Pt will go Sit to Supine/Side with min guard assist Patient Will Transfer Sit To/From Stand 01/29/2017 1337 - Progressing by Lonell Grandchild, PT Flowsheets Taken 01/29/2017 1337  Patient will transfer sit to/from stand with supervision Pt Will Transfer Bed To Chair/Chair To Bed 01/29/2017 1337 - Progressing by Lonell Grandchild, PT Flowsheets Taken 01/29/2017 1337  Pt will Transfer Bed to Chair/Chair to Bed min guard assist Pt Will Ambulate 01/29/2017 1337 - Progressing by Lonell Grandchild, PT Flowsheets Taken 01/29/2017 1337  Pt will Ambulate 25 feet;with minimal assist;with rolling walker  1:39 PM, 01/29/17 Lonell Grandchild, MPT Physical Therapist with [facility] [phone] office [phone] mobile phone

## 2017-01-29 NOTE — Progress Notes (Addendum)
[facility] PROGRESS NOTE  Darrell Black LOV:[id number] DOB: 02-19-1956 DOA: 01/15/2017 PCP: Caprice Renshaw, MD  Interim summary and HPI 61 y.o. male with medical history significant of hx of afib, CAD,hx CABG/ subsequent stents,combined s/d CHF, HL, DM, HTN, ICM, depression, morbid obesity, OSA , AICD in place and CKD who was recently admitted from 11/5-14 for heart failure (EF 35-40%) and acute on chronic respiratory failure with hypercapnia.  He is back with respiratory distress. Found to have acute on chronic CHF, resp failure with hypercapnia/hypoxia and vascular congestion with pulmonary edema.  Despite significant improvement after multiple hours of BiPAP since admission, around 1:15pm patient developed acute resp arrest and PEA; that require resuscitation and mechanical intubation.  Assessment/Plan: 1-acute on chronic hypercapnic and hypoxic respiratory failure: Now requiring mechanical intubation. -Multifactorial, including acute on chronic combined diastolic and systolic heart failure, obesity hypoventilation syndrome and obstructive sleep apnea. -Patient with vascular congestion and pulmonary edema on chest x-ray; also with elevated BNP and fluid overload on physical exam. -Appreciate assistance from Dr. Luan Pulling (pulmonologist) in management of his ventilatory support. -Will hold Lasix for now (given low blood pressure); resume again as tolerated once BP improved. -No fever or signs of infiltrates on his x-ray.  Will hold on antibiotics at this moment. -will place picc line and once available assess CVP -holding b-blocker currently given low BP and decompensated CHF -unable to use ACE or ARB given renal failure -patient in critical condition.  2-A. Fib -continue amiodarone -continue eliquis -rate is stable and well controlled -CHADSVASC score 3-4  3-HTN: currently hypotensive -will hold antihypertensive regimen  -will check CVP once central access available  -will add  low dose pressors to maintain MAP > 65  4-HLD -continue lipitor  5-hx of CAD: -continue statins and ASA -no CP -troponin neg -EKG w/o acute ischemic changes  6-acute on chronic renal failure: stage 3 at baseline  -renal service consulted -patient might ended requiring HD -will follow recommendations.  7-GERD/Stress ulcer prophylaxis  -continue PPI  8-morbid obesity -Body mass index is 52.59 kg/m. -low calorie diet discussed with patient   Code Status: Full Family Communication: no family at bedside  Disposition Plan: Remains inpatient and in the ICU.  Continue ventilatory support; continue pressors and follow clinical response.   Consultants:  Pulmonary service  Renal service  Procedures:  Endotracheal intubation with ventilatory support.  Antibiotics:  None   HPI/Subjective: Initially improved and denied chest pain, nausea, vomiting, abdominal pain, dysuria or any other complaints.  Subsequently around 1:15 in the afternoon, patient developed cardiopulmonary arrest most likely triggered by hypoxia, he required to be intubated.  Patient currently unresponsive and under sedation.  Objective: Vitals:   01/29/17 0804 01/29/17 0900  BP:  97/76  Pulse:  70  Resp:  15  Temp:  (!) 94.7 F (34.8 C)  SpO2: 97% 95%    Intake/Output Summary (Last 24 hours) at 01/29/2017 1037 Last data filed at 01/29/2017 0500 Gross per 24 hour  Intake -  Output 500 ml  Net -500 ml   Filed Weights   01/26/2017 1252 01/23/2017 1854 01/29/17 0402  Weight: (!) 150.6 kg (332 lb) (!) 156.3 kg (344 lb 9.3 oz) (!) 156.9 kg (345 lb 14.4 oz)    Exam:   General: No fever, sedated, intubated and receiving ventilatory support.  Glascow score 3  Cardiovascular: Rate control, no rubs, no gallops, no murmurs  Respiratory: Decreased breath sounds at the bases, positive fine crackles, no wheezing.  Abdomen: Obese, soft, nontender,  nondistended, positive bowel sounds.  Musculoskeletal:  3++ edema bilaterally, open wound from stasis dermatitis in his right leg (w/o signs of infection or suppuration); no cyanosis.   Data Reviewed: Basic Metabolic Panel: Recent Labs  Lab 01/23/17 0353 01/20/2017 1256 01/29/17 0454  NA 141 133* 135  K 3.4* 3.7 3.4*  CL 83* 77* 78*  CO2 50* 45* 44*  GLUCOSE 104* 103* 106*  BUN 70* 90* 93*  CREATININE 2.02* 3.46* 3.49*  CALCIUM 8.5* 8.0* 8.0*  MG 1.6*  --   --   PHOS 3.2  --   --    Liver Function Tests: Recent Labs  Lab 01/23/17 0353 01/29/2017 1256  AST 18 22  ALT 15* 18  ALKPHOS 121 132*  BILITOT 1.3* 1.4*  PROT 6.3* 6.4*  ALBUMIN 3.3* 3.5   CBC: Recent Labs  Lab 01/25/2017 1256 01/29/17 0454  WBC 6.3 5.6  NEUTROABS 5.1 4.5  HGB 9.7* 10.1*  HCT 33.4* 35.4*  MCV 101.2* 102.9*  PLT 149* 134*   Cardiac Enzymes: Recent Labs  Lab 01/20/2017 1316  TROPONINI <0.03   BNP (last 3 results) Recent Labs    12/11/16 2330 01/14/17 1436 02/05/2017 1316  BNP 583.0* 687.0* 674.0*   CBG: Recent Labs  Lab 02/07/2017 1305 01/29/17 0927  GLUCAP 110* 100*   Studies: Dg Chest Port 1 View  Result Date: 01/27/2017 CLINICAL DATA:  Respiratory distress. History of atrial fibrillation, CHF, coronary artery disease, nonsmoker. EXAM: PORTABLE CHEST 1 VIEW COMPARISON:  Portable chest x-ray of January 17, 2017 FINDINGS: The lungs remain hypoinflated which is accentuated by the lordotic positioning. The interstitial markings remain increased. The pulmonary vascularity remains engorged and the cardiac silhouette enlarged. The ICD is in stable position. There is a probable small right pleural effusion. IMPRESSION: CHF with pulmonary interstitial edema. There has been slight deterioration since the previous study. Electronically Signed   By: David  Martinique M.D.   On: 02/04/2017 14:11    Scheduled Meds: . amiodarone  200 mg Oral Daily  . apixaban  5 mg Oral BID  . aspirin EC  81 mg Oral Daily  . atorvastatin  80 mg Oral QPM  . carvedilol   3.125 mg Oral BID WC  . furosemide  80 mg Intravenous Q6H  . gabapentin  100 mg Oral BID  . insulin aspart  0-15 Units Subcutaneous TID WC  . insulin aspart  0-5 Units Subcutaneous QHS  . ipratropium-albuterol  3 mL Nebulization Q6H  . mouth rinse  15 mL Mouth Rinse BID  . potassium chloride  40 mEq Oral Daily  . senna  1 tablet Oral QHS  . sodium chloride flush  3 mL Intravenous Q12H   Continuous Infusions: . sodium chloride      Time spent: 105 minutes total; with 65-70 minutes critical care provided, running resuscitation code/stabilizing patient, coordinating care and arranging further consultations.   Barton Dubois  [facility] Pager [phone]. If 7PM-7AM, please contact night-coverage at [email or web address], password [facility] 01/29/2017, 10:37 AM  LOS: 1 day

## 2017-01-30 ENCOUNTER — Inpatient Hospital Stay (HOSPITAL_COMMUNITY): Payer: Medicaid Other

## 2017-01-30 ENCOUNTER — Encounter (HOSPITAL_COMMUNITY): Admission: EM | Disposition: E | Payer: Self-pay | Source: Home / Self Care | Attending: Pulmonary Disease

## 2017-01-30 DIAGNOSIS — J810 Acute pulmonary edema: Secondary | ICD-10-CM

## 2017-01-30 DIAGNOSIS — Z9889 Other specified postprocedural states: Secondary | ICD-10-CM

## 2017-01-30 DIAGNOSIS — I5043 Acute on chronic combined systolic (congestive) and diastolic (congestive) heart failure: Secondary | ICD-10-CM

## 2017-01-30 DIAGNOSIS — N172 Acute kidney failure with medullary necrosis: Secondary | ICD-10-CM

## 2017-01-30 DIAGNOSIS — I4891 Unspecified atrial fibrillation: Secondary | ICD-10-CM

## 2017-01-30 DIAGNOSIS — J9622 Acute and chronic respiratory failure with hypercapnia: Secondary | ICD-10-CM

## 2017-01-30 DIAGNOSIS — I4892 Unspecified atrial flutter: Secondary | ICD-10-CM

## 2017-01-30 DIAGNOSIS — I361 Nonrheumatic tricuspid (valve) insufficiency: Secondary | ICD-10-CM

## 2017-01-30 DIAGNOSIS — N183 Chronic kidney disease, stage 3 (moderate): Secondary | ICD-10-CM

## 2017-01-30 DIAGNOSIS — R570 Cardiogenic shock: Secondary | ICD-10-CM

## 2017-01-30 DIAGNOSIS — I472 Ventricular tachycardia: Secondary | ICD-10-CM

## 2017-01-30 LAB — BASIC METABOLIC PANEL
ANION GAP: 11 (ref 5–15)
ANION GAP: 10 (ref 5–15)
Anion gap: 13 (ref 5–15)
Anion gap: 12 (ref 5–15)
BUN: 97 mg/dL — ABNORMAL HIGH (ref 6–20)
BUN: 96 mg/dL — ABNORMAL HIGH (ref 6–20)
BUN: 93 mg/dL — AB (ref 6–20)
BUN: 93 mg/dL — AB (ref 6–20)
CALCIUM: 8.2 mg/dL — AB (ref 8.9–10.3)
CALCIUM: 8 mg/dL — AB (ref 8.9–10.3)
CALCIUM: 7.6 mg/dL — AB (ref 8.9–10.3)
CALCIUM: 7.6 mg/dL — AB (ref 8.9–10.3)
CHLORIDE: 78 mmol/L — AB (ref 101–111)
CHLORIDE: 82 mmol/L — AB (ref 101–111)
CO2: 46 mmol/L — AB (ref 22–32)
CO2: 47 mmol/L — ABNORMAL HIGH (ref 22–32)
CO2: 40 mmol/L — ABNORMAL HIGH (ref 22–32)
CO2: 43 mmol/L — ABNORMAL HIGH (ref 22–32)
CREATININE: 3.57 mg/dL — AB (ref 0.61–1.24)
CREATININE: 3.52 mg/dL — AB (ref 0.61–1.24)
Chloride: 77 mmol/L — ABNORMAL LOW (ref 101–111)
Chloride: 80 mmol/L — ABNORMAL LOW (ref 101–111)
Creatinine, Ser: 3.46 mg/dL — ABNORMAL HIGH (ref 0.61–1.24)
Creatinine, Ser: 3.56 mg/dL — ABNORMAL HIGH (ref 0.61–1.24)
GFR calc non Af Amer: 18 mL/min — ABNORMAL LOW (ref >60)
GFR, EST AFRICAN AMERICAN: 20 mL/min — AB (ref >60)
GFR, EST AFRICAN AMERICAN: 20 mL/min — AB (ref >60)
GFR, EST AFRICAN AMERICAN: 20 mL/min — AB (ref >60)
GFR, EST AFRICAN AMERICAN: 20 mL/min — AB (ref >60)
GFR, EST NON AFRICAN AMERICAN: 17 mL/min — AB (ref >60)
GFR, EST NON AFRICAN AMERICAN: 17 mL/min — AB (ref >60)
GFR, EST NON AFRICAN AMERICAN: 17 mL/min — AB (ref >60)
Glucose, Bld: 126 mg/dL — ABNORMAL HIGH (ref 65–99)
Glucose, Bld: 135 mg/dL — ABNORMAL HIGH (ref 65–99)
Glucose, Bld: 119 mg/dL — ABNORMAL HIGH (ref 65–99)
Glucose, Bld: 133 mg/dL — ABNORMAL HIGH (ref 65–99)
POTASSIUM: 3.5 mmol/L (ref 3.5–5.1)
POTASSIUM: 3.5 mmol/L (ref 3.5–5.1)
Potassium: 3.5 mmol/L (ref 3.5–5.1)
Potassium: 3.7 mmol/L (ref 3.5–5.1)
SODIUM: 134 mmol/L — AB (ref 135–145)
SODIUM: 135 mmol/L (ref 135–145)
Sodium: 135 mmol/L (ref 135–145)
Sodium: 135 mmol/L (ref 135–145)

## 2017-01-30 LAB — POCT I-STAT 3, ART BLOOD GAS (G3+)
Acid-Base Excess: 26 mmol/L — ABNORMAL HIGH (ref 0.0–2.0)
Acid-Base Excess: 25 mmol/L — ABNORMAL HIGH (ref 0.0–2.0)
Bicarbonate: 51.5 mmol/L — ABNORMAL HIGH (ref 20.0–28.0)
Bicarbonate: 52 mmol/L — ABNORMAL HIGH (ref 20.0–28.0)
O2 SAT: 96 %
O2 Saturation: 94 %
PCO2 ART: 56.3 mmHg — AB (ref 32.0–48.0)
PCO2 ART: 65.3 mmHg — AB (ref 32.0–48.0)
PH ART: 7.569 — AB (ref 7.350–7.450)
PH ART: 7.509 — AB (ref 7.350–7.450)
PO2 ART: 73 mmHg — AB (ref 83.0–108.0)
Patient temperature: 37
Patient temperature: 37
TCO2: >50 mmol/L — ABNORMAL HIGH (ref 22–32)
TCO2: >50 mmol/L — ABNORMAL HIGH (ref 22–32)
pO2, Arterial: 69 mmHg — ABNORMAL LOW (ref 83.0–108.0)

## 2017-01-30 LAB — GLUCOSE, CAPILLARY
GLUCOSE-CAPILLARY: 144 mg/dL — AB (ref 65–99)
GLUCOSE-CAPILLARY: 129 mg/dL — AB (ref 65–99)
GLUCOSE-CAPILLARY: 151 mg/dL — AB (ref 65–99)
Glucose-Capillary: 126 mg/dL — ABNORMAL HIGH (ref 65–99)
Glucose-Capillary: 119 mg/dL — ABNORMAL HIGH (ref 65–99)
Glucose-Capillary: 115 mg/dL — ABNORMAL HIGH (ref 65–99)

## 2017-01-30 LAB — PROTIME-INR
INR: 2.31
Prothrombin Time: 25.2 seconds — ABNORMAL HIGH (ref 11.4–15.2)

## 2017-01-30 LAB — PROCALCITONIN: PROCALCITONIN: 0.42 ng/mL

## 2017-01-30 LAB — BLOOD GAS, ARTERIAL
ACID-BASE EXCESS: 21.9 mmol/L — AB (ref 0.0–2.0)
BICARBONATE: 44.5 mmol/L — AB (ref 20.0–28.0)
Drawn by: 213101
FIO2: 100
LHR: 16 {breaths}/min
MECHVT: 550 mL
O2 Saturation: 94.5 %
PEEP/CPAP: 5 cmH2O
PO2 ART: 77.6 mmHg — AB (ref 83.0–108.0)
Patient temperature: 37.6
pCO2 arterial: 62.7 mmHg — ABNORMAL HIGH (ref 32.0–48.0)
pH, Arterial: 7.493 — ABNORMAL HIGH (ref 7.350–7.450)

## 2017-01-30 LAB — TROPONIN I
TROPONIN I: 0.15 ng/mL — AB (ref <0.03)
TROPONIN I: 0.2 ng/mL — AB (ref <0.03)

## 2017-01-30 LAB — COOXEMETRY PANEL
CARBOXYHEMOGLOBIN: 1.9 % — AB (ref 0.5–1.5)
CARBOXYHEMOGLOBIN: 1 % (ref 0.5–1.5)
Carboxyhemoglobin: 1 % (ref 0.5–1.5)
Carboxyhemoglobin: 1.9 % — ABNORMAL HIGH (ref 0.5–1.5)
Methemoglobin: 1 % (ref 0.0–1.5)
Methemoglobin: 0.7 % (ref 0.0–1.5)
Methemoglobin: 1.2 % (ref 0.0–1.5)
Methemoglobin: 1 % (ref 0.0–1.5)
O2 SAT: 74.9 %
O2 SAT: 74.9 %
O2 Saturation: 35.3 %
O2 Saturation: 66.9 %
TOTAL HEMOGLOBIN: 9.2 g/dL — AB (ref 12.0–16.0)
Total hemoglobin: 9.2 g/dL — ABNORMAL LOW (ref 12.0–16.0)
Total hemoglobin: 10.2 g/dL — ABNORMAL LOW (ref 12.0–16.0)
Total hemoglobin: 9.8 g/dL — ABNORMAL LOW (ref 12.0–16.0)

## 2017-01-30 LAB — ECHOCARDIOGRAM COMPLETE
Height: 68 in
WEIGHTICAEL: 5523.85 [oz_av]

## 2017-01-30 LAB — LACTIC ACID, PLASMA: LACTIC ACID, VENOUS: 1.1 mmol/L (ref 0.5–1.9)

## 2017-01-30 LAB — MAGNESIUM: MAGNESIUM: 1.6 mg/dL — AB (ref 1.7–2.4)

## 2017-01-30 SURGERY — RIGHT/LEFT HEART CATH AND CORONARY ANGIOGRAPHY
Anesthesia: LOCAL

## 2017-01-30 MED ORDER — MIDAZOLAM HCL 2 MG/2ML IJ SOLN
INTRAMUSCULAR | Status: AC
  Filled 2017-01-30: qty 4

## 2017-01-30 MED ORDER — ATORVASTATIN CALCIUM 80 MG PO TABS
80.0000 mg | ORAL_TABLET | Freq: Every evening | ORAL | Status: DC
  Administered 2017-01-30 – 2017-02-02 (×4): 80 mg
  Filled 2017-01-30 (×4): qty 1

## 2017-01-30 MED ORDER — FENTANYL 2500MCG IN NS 250ML (10MCG/ML) PREMIX INFUSION
25.0000 ug/h | INTRAVENOUS | Status: DC
  Administered 2017-02-02: 50 ug/h via INTRAVENOUS
  Filled 2017-01-30: qty 250

## 2017-01-30 MED ORDER — DEXTROSE 5 % IV SOLN
0.0000 ug/min | INTRAVENOUS | Status: DC
  Administered 2017-01-30 – 2017-01-31 (×4): 40 ug/min via INTRAVENOUS
  Administered 2017-02-01: 22 ug/min via INTRAVENOUS
  Administered 2017-02-02: 20 ug/min via INTRAVENOUS
  Filled 2017-01-30 (×8): qty 16

## 2017-01-30 MED ORDER — SODIUM CHLORIDE 0.9 % IV SOLN
25.0000 ug/h | INTRAVENOUS | Status: DC
  Administered 2017-01-30: 50 ug/h via INTRAVENOUS
  Filled 2017-01-30 (×2): qty 50

## 2017-01-30 MED ORDER — CHLORHEXIDINE GLUCONATE 0.12% ORAL RINSE (MEDLINE KIT)
15.0000 mL | Freq: Two times a day (BID) | OROMUCOSAL | Status: DC
Start: 2017-01-30 — End: 2017-02-03
  Administered 2017-01-30 – 2017-02-02 (×7): 15 mL via OROMUCOSAL

## 2017-01-30 MED ORDER — SODIUM CHLORIDE 0.9% FLUSH: 10.0000 mL | Freq: Two times a day (BID) | INTRAVENOUS | Status: DC

## 2017-01-30 MED ORDER — CHLORHEXIDINE GLUCONATE CLOTH 2 % EX PADS
6.0000 | MEDICATED_PAD | Freq: Every day | CUTANEOUS | Status: DC
  Administered 2017-01-30 – 2017-02-02 (×4): 6 via TOPICAL

## 2017-01-30 MED ORDER — SODIUM CHLORIDE 0.9% FLUSH
10.0000 mL | INTRAVENOUS | Status: DC | PRN
Start: 2017-01-30 — End: 2017-02-03

## 2017-01-30 MED ORDER — FENTANYL CITRATE (PF) 100 MCG/2ML IJ SOLN
INTRAMUSCULAR | Status: AC
  Filled 2017-01-30: qty 4

## 2017-01-30 MED ORDER — ETOMIDATE 2 MG/ML IV SOLN
20.0000 mg | Freq: Once | INTRAVENOUS | Status: AC
  Administered 2017-01-30: 20 mg via INTRAVENOUS

## 2017-01-30 MED ORDER — AMIODARONE HCL IN DEXTROSE 360-4.14 MG/200ML-% IV SOLN
60.0000 mg/h | INTRAVENOUS | Status: AC
  Administered 2017-01-30: 60 mg/h via INTRAVENOUS
  Filled 2017-01-30 (×2): qty 200

## 2017-01-30 MED ORDER — ETOMIDATE 2 MG/ML IV SOLN
INTRAVENOUS | Status: AC
  Filled 2017-01-30: qty 10

## 2017-01-30 MED ORDER — SODIUM CHLORIDE 0.9 % IV SOLN
0.0000 ug/min | INTRAVENOUS | Status: DC
  Administered 2017-01-30: 20 ug/min via INTRAVENOUS
  Filled 2017-01-30 (×2): qty 1

## 2017-01-30 MED ORDER — PERFLUTREN LIPID MICROSPHERE
INTRAVENOUS | Status: AC
  Administered 2017-01-30: 17:00:00
  Filled 2017-01-30: qty 10

## 2017-01-30 MED ORDER — PROPOFOL 1000 MG/100ML IV EMUL
INTRAVENOUS | Status: AC
  Filled 2017-01-30: qty 100

## 2017-01-30 MED ORDER — ORAL CARE MOUTH RINSE
15.0000 mL | OROMUCOSAL | Status: DC
  Administered 2017-01-30 – 2017-02-02 (×30): 15 mL via OROMUCOSAL

## 2017-01-30 MED ORDER — CHLORHEXIDINE GLUCONATE CLOTH 2 % EX PADS: 6.0000 | MEDICATED_PAD | Freq: Every day | CUTANEOUS | Status: DC

## 2017-01-30 MED ORDER — PERFLUTREN LIPID MICROSPHERE
1.0000 mL | INTRAVENOUS | Status: AC | PRN
  Administered 2017-01-30: 2 mL via INTRAVENOUS
  Filled 2017-01-30: qty 10

## 2017-01-30 MED ORDER — MAGNESIUM SULFATE 2 GM/50ML IV SOLN
2.0000 g | Freq: Once | INTRAVENOUS | Status: AC
  Administered 2017-01-30: 2 g via INTRAVENOUS
  Filled 2017-01-30: qty 50

## 2017-01-30 MED ORDER — POTASSIUM CHLORIDE 10 MEQ/100ML IV SOLN
10.0000 meq | INTRAVENOUS | Status: AC
  Administered 2017-01-30 (×4): 10 meq via INTRAVENOUS
  Filled 2017-01-30 (×4): qty 100

## 2017-01-30 MED ORDER — FENTANYL 2500MCG IN NS 250ML (10MCG/ML) PREMIX INFUSION
INTRAVENOUS | Status: AC
  Filled 2017-01-30: qty 250

## 2017-01-30 MED ORDER — AMIODARONE HCL IN DEXTROSE 360-4.14 MG/200ML-% IV SOLN
30.0000 mg/h | INTRAVENOUS | Status: DC
  Administered 2017-01-30 – 2017-02-02 (×9): 30 mg/h via INTRAVENOUS
  Filled 2017-01-30 (×7): qty 200

## 2017-01-30 MED ORDER — HEPARIN BOLUS VIA INFUSION
3000.0000 [IU] | Freq: Once | INTRAVENOUS | Status: AC
  Administered 2017-01-30: 3000 [IU] via INTRAVENOUS
  Filled 2017-01-30: qty 3000

## 2017-01-30 MED ORDER — FUROSEMIDE 10 MG/ML IJ SOLN
15.0000 mg/h | INTRAMUSCULAR | Status: DC
  Administered 2017-01-30 – 2017-01-31 (×2): 12 mg/h via INTRAVENOUS
  Administered 2017-02-02 (×2): 15 mg/h via INTRAVENOUS
  Filled 2017-01-30 (×2): qty 25
  Filled 2017-01-30: qty 20
  Filled 2017-01-30 (×4): qty 25

## 2017-01-30 MED ORDER — HEPARIN (PORCINE) IN NACL 100-0.45 UNIT/ML-% IJ SOLN
1050.0000 [IU]/h | INTRAMUSCULAR | Status: DC
  Administered 2017-01-30: 1500 [IU]/h via INTRAVENOUS
  Administered 2017-01-31: 1300 [IU]/h via INTRAVENOUS
  Filled 2017-01-30 (×3): qty 250

## 2017-01-30 MED ORDER — POTASSIUM CHLORIDE 10 MEQ/100ML IV SOLN: 10.0000 meq | INTRAVENOUS | Status: AC

## 2017-01-30 MED ORDER — POTASSIUM CHLORIDE 10 MEQ/50ML IV SOLN
10.0000 meq | INTRAVENOUS | Status: AC
  Administered 2017-01-30 (×3): 10 meq via INTRAVENOUS
  Filled 2017-01-30 (×3): qty 50

## 2017-01-30 MED ORDER — SODIUM CHLORIDE 0.9% FLUSH
10.0000 mL | Freq: Two times a day (BID) | INTRAVENOUS | Status: DC
  Administered 2017-01-30 – 2017-02-01 (×4): 10 mL
  Administered 2017-02-01: 30 mL
  Administered 2017-02-02: 10 mL

## 2017-01-30 MED ORDER — NOREPINEPHRINE BITARTRATE 1 MG/ML IV SOLN: 0.0000 ug/min | INTRAVENOUS | Status: DC

## 2017-01-30 MED ORDER — ETOMIDATE 2 MG/ML IV SOLN
INTRAVENOUS | Status: AC
Start: 2017-01-30 — End: 2017-01-30
  Filled 2017-01-30: qty 10

## 2017-01-30 MED ORDER — FUROSEMIDE 10 MG/ML IJ SOLN
80.0000 mg | Freq: Once | INTRAMUSCULAR | Status: AC
  Administered 2017-01-30: 80 mg via INTRAVENOUS
  Filled 2017-01-30: qty 8

## 2017-01-30 MED ORDER — SODIUM CHLORIDE 0.9 % IV SOLN
0.0000 ug/min | INTRAVENOUS | Status: DC
  Administered 2017-01-30: 100 ug/min via INTRAVENOUS
  Administered 2017-01-31: 205 ug/min via INTRAVENOUS
  Administered 2017-01-31: 300 ug/min via INTRAVENOUS
  Filled 2017-01-30 (×4): qty 4

## 2017-01-30 MED ORDER — SODIUM CHLORIDE 0.9% FLUSH: 10.0000 mL | INTRAVENOUS | Status: DC | PRN

## 2017-01-30 NOTE — Consult Note (Signed)
Cardiology Consultation:   Patient ID: Jaquin Coy; [id number]; 01/26/1956   Admit date: 01/10/2017 Date of Consult: 01/31/2017  Primary Care Provider: Caprice Renshaw, MD Primary Cardiologist: Dr Domenic Polite Primary Electrophysiologist:  n/a   Patient Profile:   Dionis Autry is a 61 y.o. male with a hx of ICM who is being seen today for the evaluation of ventricular tachcyardia at the request of Dr Allyson Sabal.  History of Present Illness:   Mr. Leppo 61 yo male with a complex cardiac history previously managed at other facilities, his first interaction with the [facility] system was earlier this month when records were obtained. From prior admit notes history of CAD with prior CABG in 2001, history of chronic combined systolic/diastolic HF LVEF 47-42%, ICM with St Jude ICD, PAF, CKD 3, OSA, obesity hypovent, admitted with SOB. Suffered PEA arrest thought to be respiratory, intubated yesterday. Has had issues with hypotension after arrest, along with wide complex tachycardia this morning.    K 3.5, Cr 3.46, BUN 97, Mg 1.6,  ABG: 7.49/63/78 on vent 100% Fio2 12/2016 echo: LVEF 35-40% CXR pulm edema  Past Medical History:  Diagnosis Date  . Atherosclerosis   . Atrial fibrillation ([place])   . 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 Chest pain   . Chronic combined systolic and diastolic heart failure ([place])    a. 12/2016: echo showing EF of 35-40% , diffuse HK, mild TR, and PA pressure of 38 mm Hg  . Chronic pain   . Coronary artery disease    a. s/p CABG in 2001  . 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 Hyperlipidemia   . Hypertension    essential  . Ischemic cardiomyopathy   . Major depressive disorder, recurrent ([place])   . Morbid obesity due to excess calories ([place])   . Muscle weakness (generalized)   . Neuropathy   . Obstructive sleep apnea   . Presence of automatic cardioverter/defibrillator (AICD)   . Renal disorder    Chronic kidney  disease  . Restless leg syndrome   . Sick-euthyroid syndrome     Past Surgical History:  Procedure Laterality Date  . COLONOSCOPY N/A 12/20/2016   Procedure: COLONOSCOPY;  Surgeon: Mauri Pole, MD;  Location: [facility] ENDOSCOPY;  Service: Endoscopy;  Laterality: N/A;      Inpatient Medications: Scheduled Meds: . apixaban  5 mg Oral BID  . aspirin EC  81 mg Oral Daily  . atorvastatin  80 mg Oral QPM  . chlorhexidine gluconate (MEDLINE KIT)  15 mL Mouth Rinse BID  . Chlorhexidine Gluconate Cloth  6 each Topical Daily  . gabapentin  100 mg Oral BID  . insulin aspart  0-20 Units Subcutaneous Q4H  . ipratropium-albuterol  3 mL Nebulization Q6H  . mouth rinse  15 mL Mouth Rinse BID  . pantoprazole (PROTONIX) IV  40 mg Intravenous Daily  . potassium chloride  40 mEq Oral Daily  . senna  1 tablet Oral QHS  . sodium chloride flush  10-40 mL Intracatheter Q12H  . sodium chloride flush  3 mL Intravenous Q12H   Continuous Infusions: . sodium chloride 250 mL (01/29/17 1709)  . amiodarone 60 mg/hr (01/27/2017 0800)  . amiodarone    . fentaNYL infusion INTRAVENOUS 200 mcg/hr (01/27/2017 0530)  . magnesium sulfate 1 - 4 g bolus IVPB 2 g (01/13/2017 0841)  . norepinephrine (LEVOPHED) Adult infusion 28 mcg/min (01/16/2017 0530)  . potassium chloride 10 mEq (01/19/2017 0800)  PRN Meds: sodium chloride, acetaminophen, fentaNYL (SUBLIMAZE) injection, fentaNYL (SUBLIMAZE) injection, midazolam, midazolam, ondansetron (ZOFRAN) IV, sodium chloride flush, sodium chloride flush  Allergies:    Allergies  Allergen Reactions  . Fish Allergy   . Ampicillin Rash  . Clindamycin/Lincomycin Rash  . Penicillins Rash    Has patient had a PCN reaction causing immediate rash, facial/tongue/throat swelling, SOB or lightheadedness with hypotension: Yes Has patient had a PCN reaction causing severe rash involving mucus membranes or skin necrosis: No Has patient had a PCN reaction that required hospitalization:  No Has patient had a PCN reaction occurring within the last 10 years: No If all of the above answers are "NO", then may proceed with Cephalosporin use.     Social History:   Social History   Socioeconomic History  . Marital status: Single    Spouse name: Not on file  . Number of children: Not on file  . Years of education: Not on file  . Highest education level: Not on file  Social Needs  . Financial resource strain: Not on file  . Food insecurity - worry: Not on file  . Food insecurity - inability: Not on file  . Transportation needs - medical: Not on file  . Transportation needs - non-medical: Not on file  Occupational History  . Not on file  Tobacco Use  . Smoking status: Never Smoker  . Smokeless tobacco: Never Used  Substance and Sexual Activity  . Alcohol use: No  . Drug use: No  . Sexual activity: No  Other Topics Concern  . Not on file  Social History Narrative  . Not on file    Family History:    Family History  Problem Relation Age of Onset  . Hypertension Father   . Cancer Maternal Grandmother   . Alcohol abuse Maternal Grandfather      EHO:ZYYQMG to obtain, patient is sedated and intubated   Physical Exam/Data:   Vitals:   02/06/2017 0545 01/16/2017 0600 01/19/2017 0826 01/27/2017 0828  BP: (!) 71/60 99/78    Pulse: (!) 112 (!) 110    Resp: 16 20    Temp: 99.8 F (37.7 C) 99.8 F (37.7 C)    TempSrc:      SpO2: 97% 98% 96%   Weight:      Height:    5' 8"  (1.727 m)    Intake/Output Summary (Last 24 hours) at 01/25/2017 0844 Last data filed at 01/17/2017 0600 Gross per 24 hour  Intake 1200.06 ml  Output 1750 ml  Net -549.94 ml   Filed Weights   01/10/2017 1854 01/29/17 0402 01/22/2017 0424  Weight: (!) 344 lb 9.3 oz (156.3 kg) (!) 345 lb 14.4 oz (156.9 kg) (!) 345 lb 3.9 oz (156.6 kg)   Body mass index is 52.49 kg/m.  General: sedated HEENT: normal Lymph: no adenopathy Neck: elevated JVD Endocrine:  No thryomegaly Cardiac:  normal S1, S2;  RRR;  Lungs:  Coarse bilaterally Abd: soft, nontender, no hepatomegaly  Musculoskeletal:  No deformities, BUE and BLE strength normal and equal Skin: warm and dry  Neuro: sedated, cannot assess Psych:  Sedated, cannot assess  Telemetry:  Telemetry was personally reviewed and demonstrates:  Episodes of wide complex tachycardia    Laboratory Data:  Chemistry Recent Labs  Lab 01/29/17 1342 01/29/17 2115 02/03/2017 0533  NA 135 135 135  K 3.3* 3.7 3.5  CL 77* 78* 78*  CO2 43* 44* 46*  GLUCOSE 188* 136* 126*  BUN 94* 96* 97*  CREATININE 3.78* 3.65* 3.46*  CALCIUM 7.8* 8.0* 8.2*  GFRNONAA 16* 17* 18*  GFRAA 18* 19* 20*  ANIONGAP 15 13 11     Recent Labs  Lab 01/11/2017 1256  PROT 6.4*  ALBUMIN 3.5  AST 22  ALT 18  ALKPHOS 132*  BILITOT 1.4*   Hematology Recent Labs  Lab 01/10/2017 1256 01/29/17 0454 01/29/17 1342  WBC 6.3 5.6 9.3  RBC 3.30* 3.44* 3.36*  HGB 9.7* 10.1* 9.9*  HCT 33.4* 35.4* 34.7*  MCV 101.2* 102.9* 103.3*  MCH 29.4 29.4 29.5  MCHC 29.0* 28.5* 28.5*  RDW 16.8* 17.1* 17.0*  PLT 149* 134* 172   Cardiac Enzymes Recent Labs  Lab 01/23/2017 1316 01/29/17 1342  TROPONINI <0.03 <0.03   No results for input(s): TROPIPOC in the last 168 hours.  BNP Recent Labs  Lab 01/23/2017 1316  BNP 674.0*    DDimer No results for input(s): DDIMER in the last 168 hours.  Radiology/Studies:  Dg Chest Port 1 View  Result Date: 02/03/2017 CLINICAL DATA:  Ventilator patient. Atrial fibrillation, diabetes, hypertension, nonsmoker, enteric tube placement EXAM: PORTABLE CHEST 1 VIEW COMPARISON:  01/29/2017 FINDINGS: Postoperative changes in the mediastinum. Cardiac pacemaker. Endotracheal tube with tip measuring 3.5 cm above the carina. Enteric tube is only segmentally visualized due to technique but probably extends to the left upper quadrant. Cardiac enlargement with mild vascular congestion. Shallow inspiration with atelectasis in the lung bases. Probable small  bilateral pleural effusions. No pneumothorax. IMPRESSION: Appliances appear in satisfactory location although the enteric tube is incompletely visualized. Cardiac enlargement with pulmonary vascular congestion and bilateral pleural effusions. Atelectasis in the lung bases. Electronically Signed   By: Lucienne Capers M.D.   On: 01/29/2017 05:29   Dg Chest Port 1 View  Result Date: 01/29/2017 CLINICAL DATA:  Status post intubation. EXAM: PORTABLE CHEST 1 VIEW COMPARISON:  Radiograph of November 28, 2016. FINDINGS: Stable cardiomegaly with central pulmonary vascular congestion is noted. Endotracheal tube is seen projected over tracheal air shadow with distal tip 2 cm above the carina. Nasogastric tube is seen entering stomach. Left-sided pacemaker is unchanged in position. Increased bilateral lung opacities are noted concerning for worsening pulmonary edema. Minimal bilateral pleural effusions are noted. Bony thorax is unremarkable. IMPRESSION: Endotracheal tube in grossly good position. Stable cardiomegaly with central pulmonary vascular congestion and bilateral pulmonary edema. Minimal bilateral pleural effusions are noted. Electronically Signed   By: Marijo Conception, M.D.   On: 01/29/2017 14:08   Dg Chest Port 1 View  Result Date: 01/22/2017 CLINICAL DATA:  Respiratory distress. History of atrial fibrillation, CHF, coronary artery disease, nonsmoker. EXAM: PORTABLE CHEST 1 VIEW COMPARISON:  Portable chest x-ray of January 17, 2017 FINDINGS: The lungs remain hypoinflated which is accentuated by the lordotic positioning. The interstitial markings remain increased. The pulmonary vascularity remains engorged and the cardiac silhouette enlarged. The ICD is in stable position. There is a probable small right pleural effusion. IMPRESSION: CHF with pulmonary interstitial edema. There has been slight deterioration since the previous study. Electronically Signed   By: David  Martinique M.D.   On: 01/23/2017 14:11     Assessment and Plan:   1. Acute on chronic combined systolic/diastolic HF - evidence of volume overload, diuresis has been limited by hypotension and AKI - K 3.5, Mg 1.6, pO2 78,  - repeat echo pending. CO-Ox pending, CVP pending - Discharge weight 332 lbs earlier this month, admit weight 345 lbs   2. Afib - on home amio, changed to IV in  setting of VT - remains on eliquis for stroke prevention  3. History of CAD  4. AKI on CKD - likely related to cardiac arrest and hypotension - followed by nephrology  - Cr 2 on 01/23/17, on admit 11/19 up to 3.46   5. Wide complex tachycardia - probable VT, sustained and nonsustatined episodes primarily this morning around 7AM on levophed at 30 mcg/min in setting of chronic cardiomyopathy - EKG pending this morning to evaluate for ishcemic changes.  - beta blocker on hold due to hypotension - no recurrence since amio gtt started. Electrolytes being replaced, K to 4 and Mg to 2 - interrogate ICD, from our available info looks to be a St Jude device - if recurrent, would start neo or vasopressin and wean levo - at this time do not suspect acute ischemic event as etiology, suspect related to poor myocardial substrate in setting of above abnormlities. F/u EKG, enzymes, and echo.   6. CAD - remote records reviewed during last admission indicate history of CABG in 2001 and prior PCI    We will f/u up results later this morning. May consider transfer to [facility] service as cardiology will not be at [facility] over the extended holiday weekend.    For questions or updates, please contact [place] Please consult [email or web address] for contact info under Cardiology/STEMI.   Merrily Pew, MD  01/14/2017 8:44 AM

## 2017-01-30 NOTE — Clinical Social Work Note (Signed)
Pt is a 61 year old male admitted from [facility]. Pt is known to SW from recent admission. Reviewed pt's record today. Pt currently intubated. Per RN CM, pt not being extubated today. Recent SW assessment copied below. No significant changes to this assessment have occurred since it was completed. Pt is from [facility] and will return there at dc. Spoke with [name] at [facility] today to update. Per [name], pt will need a new FL2 at dc. SW will continue to follow.  Clinical Social Work Assessment  Patient Details  Name: Darrell Black MRN: [id number] Date of Birth: [birth year]  Date of referral:  01/15/17               Reason for consult:  Facility Placement                 Permission sought to share information with:    Permission granted to share information::                Name::                   Agency::  [name] and [name] at [facility]             Relationship::                Contact Information:     Housing/Transportation Living arrangements for the past 2 months:  [place] of Information:  Patient Patient Interpreter Needed:  None Criminal Activity/Legal Involvement Pertinent to Current Situation/Hospitalization:  No - Comment as needed Significant Relationships:  Other Family Members(nieces) Lives with:  Facility Resident Do you feel safe going back to the place where you live?  Yes Need for family participation in patient care:  Yes (Comment)  Care giving concerns:  None identified. Facility resident.    Social Worker assessment / plan:  Patient has been at [facility] since 12/31/16 and is currently receiving short term rehab. Patient was placed at [facility] from [facility]. Prior to being at [facility] he was at [facility].  Patient feeds himself, is able to bathe himself, and uses a wheelchair.  He is typically alert and oriented. Patient has two nieces who live out of town and out of state who eventually want to have patient transferred to one of their areas, however they  continue to work out the logistics of this.  Patient can return to the facility at discharge.  LCSW to follow up with patient once he is stable.   Employment status:  Unemployed Forensic scientist:  Medicaid In [place] PT Recommendations:  Not assessed at this time Information / Referral to community resources:  [place]  Patient/Family's Response to care: Family is agreeable for patient to return.   Patient/Family's Understanding of and Emotional Response to Diagnosis, Current Treatment, and Prognosis:  Facility communicated with patient's family his diagnosis, treatment and prognosis. LCSW will follow up with family.  Emotional Assessment Appearance:  Appears stated age Attitude/Demeanor/Rapport:    Affect (typically observed):  Pleasant Orientation:  Oriented to Self, Oriented to Place, Oriented to  Time, Oriented to Situation Alcohol / Substance use:  Not Applicable Psych involvement (Current and /or in the community):  No (Comment)  Discharge Needs  Concerns to be addressed:  No discharge needs identified Readmission within the last 30 days:  No Current discharge risk:  None Barriers to Discharge:  No Barriers Identified   Ihor Gully, LCSW 01/15/2017, 1:36 PM

## 2017-01-30 NOTE — Progress Notes (Signed)
1955: CCM contacted for pt hypotension (MAPs upper 50's-low 60's, low 90's SBP). Levo maxed at 43. Neo titrated off early today. Orders to restart Neo, obtain an ABG and to contact cardiology with resulting co-ox.  2145: Cardiology paged with co-ox 74.9, made aware of low BP's and ABG results. Dr. Mliss Sax OK with increasing Neo as needed. Wants to hold off on inotropes for now.

## 2017-01-30 NOTE — Progress Notes (Signed)
Daily Progress Note   Patient Name: Darrell Black       Date: 01/29/2017 DOB: March 15, 1955  Age: 61 y.o. MRN#: [id number] Attending Physician: Reyne Dumas, MD Primary Care Physician: Caprice Renshaw, MD Admit Date: 01/15/2017  Reason for Consultation/Follow-up: Establishing goals of care and Psychosocial/spiritual support  Subjective: Mr. Black, "Darrell Black", is receiving active CPR when I arrived to his room.  Nursing staff shares that he, again today, was suddenly found to be in what was perceived to be V. Fib/flutter.  Several minutes of chest compressions, and Mr. Rhinesmith was found to have a pulse.  Cardiology consult has requested transfer to [facility] for cardiogenic shock, cardiac cath.  He will possibly need CVVHD.  Per nursing staff, niece, Darrell Black, (who Darrell Black had requested by his [employer] POA), has declined to be a Media planner.  Niece Darrell Black, who now lives in [place],  states that she is unable to answer the phone because of her work schedule.  Brother Darrell Black has been contacted by [employer] nieces, but he has not responded (per nursing).  We do not have brother Darrell's contact information.  Per nursing, niece Darrell Black states that she is "waiting on asked to tell her that we have done everything we can".  Call to niece, Darrell Black [place] at [phone], number no longer in service.  Call to niece, Darrell Black at [phone], left voicemail message requesting her to call back with the telephone number for brother, Avelardo Black. Search for brother, [name]. Found Ron Sandpoint in [place], [place], age 71, been unable to obtain full phone number.  Length of Stay: 2  Current Medications: Scheduled Meds:  . apixaban  5 mg Oral BID  . aspirin EC  81 mg Oral Daily  . atorvastatin  80 mg Oral QPM  .  chlorhexidine gluconate (MEDLINE KIT)  15 mL Mouth Rinse BID  . Chlorhexidine Gluconate Cloth  6 each Topical Daily  . gabapentin  100 mg Oral BID  . insulin aspart  0-20 Units Subcutaneous Q4H  . ipratropium-albuterol  3 mL Nebulization Q6H  . mouth rinse  15 mL Mouth Rinse BID  . pantoprazole (PROTONIX) IV  40 mg Intravenous Daily  . senna  1 tablet Oral QHS  . sodium chloride flush  10-40 mL Intracatheter Q12H  . sodium chloride flush  3 mL  Intravenous Q12H    Continuous Infusions: . sodium chloride 250 mL (01/12/2017 0900)  . amiodarone 60 mg/hr (01/23/2017 0800)  . amiodarone    . fentaNYL infusion INTRAVENOUS 150 mcg/hr (02/08/2017 0800)  . norepinephrine (LEVOPHED) Adult infusion    . phenylephrine (NEO-SYNEPHRINE) Adult infusion 250 mcg/min (01/15/2017 1147)  . propofol      PRN Meds: sodium chloride, acetaminophen, fentaNYL (SUBLIMAZE) injection, fentaNYL (SUBLIMAZE) injection, midazolam, midazolam, ondansetron (ZOFRAN) IV, sodium chloride flush, sodium chloride flush  Physical Exam  Constitutional:  Intubated/ventilated, no sedation  HENT:  Head: Atraumatic.  Eyes:  Pupils 2, sluggish  Cardiovascular:  Pacer  Pulmonary/Chest:  Intubated/ventilated, no sedation.  Seems to have poor ventilation  Abdominal: Soft.  Obese abdomen  Musculoskeletal: He exhibits edema.  Neurological:  Intubated/ventilated, no sedation.  No meaningful response.  Skin: Skin is warm and dry.  Nursing note and vitals reviewed.           Vital Signs: BP 118/73   Pulse 86   Temp 98.7 F (37.1 C)   Resp 13   Ht 5' 8"  (1.727 m)   Wt (!) 156.6 kg (345 lb 3.9 oz)   SpO2 (!) 89%   BMI 52.49 kg/m  SpO2: SpO2: (!) 89 % O2 Device: O2 Device: Ventilator O2 Flow Rate: O2 Flow Rate (L/min): 8 L/min  Intake/output summary:   Intake/Output Summary (Last 24 hours) at 01/16/2017 1252 Last data filed at 01/31/2017 0941 Gross per 24 hour  Intake 985.16 ml  Output 1100 ml  Net -114.84 ml   LBM:  Last BM Date: 01/27/17 Baseline Weight: Weight: (!) 150.6 kg (332 lb) Most recent weight: Weight: (!) 156.6 kg (345 lb 3.9 oz)       Palliative Assessment/Data:    Flowsheet Rows     Most Recent Value  Intake Tab  Referral Department  Hospitalist  Unit at Time of Referral  ICU  Palliative Care Primary Diagnosis  Cardiac  Date Notified  01/29/17  Palliative Care Type  New Palliative care  Reason for referral  Clarify Goals of Care  Date of Admission  01/19/2017  Date first seen by Palliative Care  01/29/17  # of days Palliative referral response time  0 Day(s)  # of days IP prior to Palliative referral  1  Clinical Assessment  Palliative Performance Scale Score  40%  Pain Max last 24 hours  Not able to report  Pain Min Last 24 hours  Not able to report  Dyspnea Max Last 24 Hours  Not able to report  Dyspnea Min Last 24 hours  Not able to report  Psychosocial & Spiritual Assessment  Palliative Care Outcomes  Patient/Family meeting held?  Yes  Who was at the meeting?  patient at bedside.   Palliative Care Outcomes  Clarified goals of care, Provided psychosocial or spiritual support, Provided advance care planning      Patient Active Problem List   Diagnosis Date Noted  . Acute pulmonary edema (HCC)   . Goals of care, counseling/discussion   . Palliative care encounter   . DNR (do not resuscitate) discussion   . ICD (implantable cardioverter-defibrillator) in place 01/14/2017  . Volume overload 01/14/2017  . Ischemic cardiomyopathy 01/14/2017  . CKD (chronic kidney disease) stage 3, GFR 30-59 ml/min (HCC) 01/14/2017  . Acute on chronic combined systolic and diastolic CHF (congestive heart failure) ([place]) 01/14/2017  . Polyp of sigmoid colon   . Morbid obesity due to excess calories ([place]) 12/18/2016  . Hypernatremia 12/18/2016  .  Rectal bleeding   . Acute on chronic respiratory failure with hypercapnia ([place])   . Visual hallucination   . 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 Acute respiratory insufficiency 12/12/2016  . Obstructive sleep apnea 12/12/2016  . Acute respiratory failure with hypoxia and hypercarbia ([place]) 12/12/2016  . Acute psychosis ([place]) 12/12/2016  . Atrial fibrillation ([place]) 12/12/2016  . Oral bleeding 12/12/2016  . Coronary artery disease 12/12/2016  . Restless leg syndrome 12/12/2016  . Hypertension 12/12/2016  . Hyperlipidemia 12/12/2016  . Chronic combined systolic and diastolic heart failure ([place]) 12/12/2016  . Hyperammonemia ([place]) 12/12/2016    Palliative Care Assessment & Plan   Patient Profile: 61 y.o. male  with past medical history of atrial fibrillation, coronary artery disease, CABG with subsequent stents, combined heart failure, diabetes, hypertension, morbid obesity, obstructive sleep apnea, AICD in place, history of chronic kidney disease, depression admitted on 01/21/2017 with acute on chronic respiratory failure, thought to be related to heart failure exacerbation  Assessment: acute on chronic respiratory failure, thought to be related to heart failure exacerbation: Sudden cardiac/pulmonary arrest 11/20 with CPR and intubation.  Again 11/21 sudden cardiac/pulmonary arrest.  Transfer to account for cardiac cath, cardiogenic shock.  Recommendations/Plan:  At this point, continue all scope treatment  Goals of Care and Additional Recommendations:  Limitations on Scope of Treatment: Full Scope Treatment  Code Status:    Code Status Orders  (From admission, onward)        Start     Ordered   01/15/2017 1922  Full code  Continuous     02/06/2017 1921    Code Status History    Date Active Date Inactive Code Status Order ID Comments User Context   01/14/2017 23:15 01/23/2017 18:34 Full Code [id number]  Roney Jaffe, MD Inpatient   12/12/2016 07:00 12/31/2016 18:42 Full Code [id number]  Reubin Milan, MD Inpatient   12/10/2016 21:25 12/12/2016  07:00 Full Code [id number]  Blanchie Dessert, MD ED       Prognosis:   < 2 weeks, would not be surprising based on cardiopulmonary arrest twice in less than 24 hours, acuity of condition, previous functional decline, worsening kidney function.  Discharge Planning:  In hospital death would not be surprising.  Care plan was discussed with nursing staff, case management, social worker, and Dr. Allyson Sabal.   Thank you for allowing the Palliative Medicine Team to assist in the care of this patient.   Time In: 1005 Time Out: 1050 Total Time 45 minutes Prolonged Time Billed  no       Greater than 50%  of this time was spent counseling and coordinating care related to the above assessment and plan.  Drue Novel, NP  Please contact Palliative Medicine Team phone at [phone] for questions and concerns.

## 2017-01-30 NOTE — Progress Notes (Signed)
Interventional Cardiology Note:   Pt arrived in 2H17. Plan from [facility] team was for cardiac cath today. The patient arrived and was hypotensive on Neosynephrine. Levophed was added and Neosynephrine resumed. Pt is intubated. PCCM is assuming care. PEA arrest yesterday in setting of respiratory arrest. Reported VT arrest this am. Creatinine is 3.5.   I do not think he is a good candidate for cardiac cath today. I will ask the CHF team to see him and optimize his medical therapy.   Lauree Chandler 01/23/2017 3:23 PM

## 2017-01-30 NOTE — Consult Note (Signed)
ELECTROPHYSIOLOGY CONSULT NOTE  Patient ID: Darrell Black, MRN: [id number], DOB/AGE: [birth year] 61 y.o. Admit date: 02/05/2017 Date of Consult: 01/17/2017  Primary Physician: Caprice Renshaw, MD Primary Cardiologist:out of system Darrell Black is a 61 y.o. male who is being seen today for the evaluation of Atrial Flutter  at the request of dr Reinaldo Berber.   Chief Complaint: Atrial flutter and Ventricular tachycardia   HPI Darrell Black is a 61 y.o. male  Transferred in shock and respiratory failure with VT this Am and persistent Aflutter, by device interrogation going on since July  Rate has been reasonably controlled since 12/17/16  w 92% < 90 bpm, although 75% paced Only 1.3% > 110 bpm   Admitted 11/20 with progressive SOB suffered PEA arrest   Has required Neo and Levophed and is on amio  Hx of CAD with CABG 2001, EF 10/18  35-40%   Acute/chronic Kidney injury   Date Cr CO2  10/22 1.31 39  11/08 1.61 43  11/14 2.02 50  11/21 3.56 47     ICD St Jude  Past Medical History:  Diagnosis Date  . Atherosclerosis   . Atrial fibrillation ([place])   . 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 Chest pain   . Chronic combined systolic and diastolic heart failure ([place])    a. 12/2016: echo showing EF of 35-40% , diffuse HK, mild TR, and PA pressure of 38 mm Hg  . Chronic pain   . Coronary artery disease    a. s/p CABG in 2001  . 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 Hyperlipidemia   . Hypertension    essential  . Ischemic cardiomyopathy   . Major depressive disorder, recurrent ([place])   . Morbid obesity due to excess calories ([place])   . Muscle weakness (generalized)   . Neuropathy   . Obstructive sleep apnea   . Presence of automatic cardioverter/defibrillator (AICD)   . Renal disorder    Chronic kidney disease  . Restless leg syndrome   . Sick-euthyroid syndrome       Surgical History:  Past Surgical History:  Procedure  Laterality Date  . COLONOSCOPY N/A 12/20/2016   Procedure: COLONOSCOPY;  Surgeon: Mauri Pole, MD;  Location[facility] ENDOSCOPY;  Service: Endoscopy;  Laterality: N/A;     Home Meds: Prior to Admission medications   Medication Sig Start Date End Date Taking? Authorizing Provider  acetaminophen (TYLENOL) 325 MG tablet Take 650 mg by mouth every 6 (six) hours as needed.   Yes [provider]  amiodarone (PACERONE) 200 MG tablet Take 200 mg by mouth daily.   Yes [provider]  apixaban (ELIQUIS) 5 MG TABS tablet Take 1 tablet (5 mg total) by mouth 2 (two) times daily. 12/31/16  Yes Isaac Bliss, Rayford Halsted, MD  atorvastatin (LIPITOR) 80 MG tablet Take 80 mg by mouth every evening.   Yes [provider]  carvedilol (COREG) 3.125 MG tablet Take 3.125 mg by mouth 2 (two) times daily with a meal.   Yes [provider]  docusate sodium (COLACE) 100 MG capsule Take 100 mg by mouth daily.   Yes [provider]  gabapentin (NEURONTIN) 100 MG capsule Take 1 capsule (100 mg total) by mouth 2 (two) times daily. 12/31/16  Yes Isaac Bliss, Rayford Halsted, MD  guaiFENesin (ROBITUSSIN) 100 MG/5ML SOLN Take 5 mLs every 8 (eight) hours as needed by mouth for cough or to loosen  phlegm.   Yes [provider]  ipratropium-albuterol (DUONEB) 0.5-2.5 (3) MG/3ML SOLN Take 3 mLs every 6 (six) hours as needed by nebulization (shortness of breath).   Yes [provider]  metolazone (ZAROXOLYN) 5 MG tablet Take 1 tablet (5 mg total) every other day by mouth. 01/23/17  Yes Isaac Bliss, Rayford Halsted, MD  nitroGLYCERIN (NITROSTAT) 0.4 MG SL tablet Place 0.4 mg under the tongue every 5 (five) minutes as needed for chest pain.   Yes [provider]  rOPINIRole (REQUIP) 1 MG tablet Take 1 mg by mouth at bedtime.   Yes [provider]  senna (SENOKOT) 8.6 MG TABS tablet Take 1 tablet by mouth at bedtime.   Yes [provider]    tiotropium (SPIRIVA) 18 MCG inhalation capsule Place 18 mcg into inhaler and inhale daily.   Yes [provider]  torsemide (DEMADEX) 100 MG tablet Take 1 tablet (100 mg total) daily by mouth. 01/24/17  Yes Isaac Bliss, Rayford Halsted, MD    Inpatient Medications:  . aspirin EC  81 mg Oral Daily  . atorvastatin  80 mg Per Tube QPM  . chlorhexidine gluconate (MEDLINE KIT)  15 mL Mouth Rinse BID  . Chlorhexidine Gluconate Cloth  6 each Topical Daily  . Chlorhexidine Gluconate Cloth  6 each Topical Daily  . heparin  3,000 Units Intravenous Once  . insulin aspart  0-20 Units Subcutaneous Q4H  . ipratropium-albuterol  3 mL Nebulization Q6H  . mouth rinse  15 mL Mouth Rinse 10 times per day  . pantoprazole (PROTONIX) IV  40 mg Intravenous Daily  . senna  1 tablet Oral QHS  . sodium chloride flush  10-40 mL Intracatheter Q12H  . sodium chloride flush  10-40 mL Intracatheter Q12H  . sodium chloride flush  10-40 mL Intracatheter Q12H  . sodium chloride flush  3 mL Intravenous Q12H     Allergies:  Allergies  Allergen Reactions  . Fish Allergy   . Ampicillin Rash  . Clindamycin/Lincomycin Rash  . Penicillins Rash    Has patient had a PCN reaction causing immediate rash, facial/tongue/throat swelling, SOB or lightheadedness with hypotension: Yes Has patient had a PCN reaction causing severe rash involving mucus membranes or skin necrosis: No Has patient had a PCN reaction that required hospitalization: No Has patient had a PCN reaction occurring within the last 10 years: No If all of the above answers are "NO", then may proceed with Cephalosporin use.     Social History   Socioeconomic History  . Marital status: Single    Spouse name: Not on file  . Number of children: Not on file  . Years of education: Not on file  . Highest education level: Not on file  Social Needs  . Financial resource strain: Not on file  . Food insecurity - worry: Not on file  . Food insecurity -  inability: Not on file  . Transportation needs - medical: Not on file  . Transportation needs - non-medical: Not on file  Occupational History  . Not on file  Tobacco Use  . Smoking status: Never Smoker  . Smokeless tobacco: Never Used  Substance and Sexual Activity  . Alcohol use: No  . Drug use: No  . Sexual activity: No  Other Topics Concern  . Not on file  Social History Narrative  . Not on file     Family History  Problem Relation Age of Onset  . Hypertension Father   . Cancer Maternal  Grandmother   . Alcohol abuse Maternal Grandfather      ROS:  Please see the history of present illness.   Intubated and not able to verbalize   All other systems reviewed and negative.    Physical Exam:  Blood pressure 118/73, pulse 86, temperature 98.7 F (37.1 C), resp. rate 13, height _0  (1.727 m), weight (!) 345 lb 3.9 oz (156.6 kg), SpO2 94 %. General: Well developed, well nourished male intubated but responds to voice Head: Normocephalic, atraumatic, sclera non-icteric, no xanthomas, nares are without discharge. EENT: normal Lymph Nodes:  none Back: not examined  Neck: Negative for carotid bruits.   Lungs: Clear bilaterally to auscultation without wheezes, rales, or rhonchi. Breathing is unlabored. Heart: irregularly irregular rate  2/6 systolic murmur , rubs, or gallops appreciated. Abdomen: Soft, non-tender, non-distended with normoactive bowel sounds. No hepatomegaly. No rebound/guarding. No obvious abdominal masses. Msk:  Strength and tone appear normal for age. Extremities: No clubbing or cyanosis.  1+ edema.  Distal pedal pulses are 2+ and equal bilaterally. Skin: Warm and Dry underneath mitts but toes cold  Neuro: Alert and oriented X 3. CN III-XII intact Grossly normal sensory and motor function . Psych:  Responds to questions appropriately with a normal affect.      Labs: Cardiac Enzymes Recent Labs    01/21/2017 1316 01/29/17 1342 02/03/2017 0742  TROPONINI  <0.03 <0.03 0.15*   CBC Lab Results  Component Value Date   WBC 9.3 01/29/2017   HGB 9.9 (L) 01/29/2017   HCT 34.7 (L) 01/29/2017   MCV 103.3 (H) 01/29/2017   PLT 172 01/29/2017   PROTIME: Recent Labs    01/29/17 2115 01/13/2017 0533  LABPROT 25.5* 25.2*  INR 2.35 2.31   Chemistry  Recent Labs  Lab 01/23/2017 1256  02/07/2017 1134  NA 133*   < > 134*  K 3.7   < > 3.5  CL 77*   < > 77*  CO2 45*   < > 47*  BUN 90*   < > 96*  CREATININE 3.46*   < > 3.56*  CALCIUM 8.0*   < > 8.0*  PROT 6.4*  --   --   BILITOT 1.4*  --   --   ALKPHOS 132*  --   --   ALT 18  --   --   AST 22  --   --   GLUCOSE 103*   < > 135*   < > = values in this interval not displayed.   Lipids No results found for: CHOL, HDL, LDLCALC, TRIG BNP No results found for: PROBNP Thyroid Function Tests: No results for input(s): TSH, T4TOTAL, T3FREE, THYROIDAB in the last 72 hours.  Invalid input(s): FREET3    Miscellaneous No results found for: DDIMER  Radiology/Studies:  Dg Chest 2 View  Result Date: 01/14/2017 CLINICAL DATA:  Shortness of Breath EXAM: CHEST  2 VIEW COMPARISON:  December 24, 2016 FINDINGS: There are small pleural effusions bilaterally. There is patchy atelectasis in the mid lower lung zones bilaterally. There is trace interstitial edema. There is cardiomegaly with pulmonary venous hypertension. Pacemaker leads are attached to the right atrium and right ventricle. Patient is status post median sternotomy. No adenopathy. There is degenerative change in the lower thoracic spine. IMPRESSION: Cardiomegaly with pulmonary venous hypertension consistent with a degree of pulmonary vascular congestion. Small pleural effusions and trace bibasilar edema noted. There is patchy atelectasis in both mid and lower lung zones. Pacemaker leads attached to right  atrium and right ventricle. Electronically Signed   By: Lowella Grip III M.D.   On: 01/14/2017 14:18   Dg Chest Port 1 View  Result Date:  01/21/2017 CLINICAL DATA:  Hypoxia. EXAM: PORTABLE CHEST 1 VIEW COMPARISON:  02/04/2017 and 01/29/2017. FINDINGS: 1240 hour. Endotracheal tube appears unchanged at the mid trachea. Left subclavian pacemaker leads in right upper arm PICC appear unchanged. Nasogastric tube is in place, tip not visualized. There is stable cardiomegaly status post median sternotomy. There is vascular congestion with probable mild edema, small bilateral pleural effusions and bibasilar atelectasis. No pneumothorax. IMPRESSION: Similar appearance of the chest with suspected congestive heart failure manifesting as pulmonary edema and bilateral pleural effusions. Endotracheal tube appears satisfactorily positioned. Tip of the enteric tube is not visualized. Electronically Signed   By: Richardean Sale M.D.   On: 02/03/2017 12:59   Dg Chest Port 1 View  Result Date: 02/05/2017 CLINICAL DATA:  Ventilator patient. Atrial fibrillation, diabetes, hypertension, nonsmoker, enteric tube placement EXAM: PORTABLE CHEST 1 VIEW COMPARISON:  01/29/2017 FINDINGS: Postoperative changes in the mediastinum. Cardiac pacemaker. Endotracheal tube with tip measuring 3.5 cm above the carina. Enteric tube is only segmentally visualized due to technique but probably extends to the left upper quadrant. Cardiac enlargement with mild vascular congestion. Shallow inspiration with atelectasis in the lung bases. Probable small bilateral pleural effusions. No pneumothorax. IMPRESSION: Appliances appear in satisfactory location although the enteric tube is incompletely visualized. Cardiac enlargement with pulmonary vascular congestion and bilateral pleural effusions. Atelectasis in the lung bases. Electronically Signed   By: Lucienne Capers M.D.   On: 01/21/2017 05:29   Dg Chest Port 1 View  Result Date: 01/29/2017 CLINICAL DATA:  Status post intubation. EXAM: PORTABLE CHEST 1 VIEW COMPARISON:  Radiograph of November 28, 2016. FINDINGS: Stable cardiomegaly with  central pulmonary vascular congestion is noted. Endotracheal tube is seen projected over tracheal air shadow with distal tip 2 cm above the carina. Nasogastric tube is seen entering stomach. Left-sided pacemaker is unchanged in position. Increased bilateral lung opacities are noted concerning for worsening pulmonary edema. Minimal bilateral pleural effusions are noted. Bony thorax is unremarkable. IMPRESSION: Endotracheal tube in grossly good position. Stable cardiomegaly with central pulmonary vascular congestion and bilateral pulmonary edema. Minimal bilateral pleural effusions are noted. Electronically Signed   By: Marijo Conception, M.D.   On: 01/29/2017 14:08   Dg Chest Port 1 View  Result Date: 01/27/2017 CLINICAL DATA:  Respiratory distress. History of atrial fibrillation, CHF, coronary artery disease, nonsmoker. EXAM: PORTABLE CHEST 1 VIEW COMPARISON:  Portable chest x-ray of January 17, 2017 FINDINGS: The lungs remain hypoinflated which is accentuated by the lordotic positioning. The interstitial markings remain increased. The pulmonary vascularity remains engorged and the cardiac silhouette enlarged. The ICD is in stable position. There is a probable small right pleural effusion. IMPRESSION: CHF with pulmonary interstitial edema. There has been slight deterioration since the previous study. Electronically Signed   By: David  Martinique M.D.   On: 01/12/2017 14:11   Dg Chest Port 1 View  Result Date: 01/17/2017 CLINICAL DATA:  61 year old male admitted 3 days ago with shortness of Breath, decompensated CHF. EXAM: PORTABLE CHEST 1 VIEW COMPARISON:  01/16/2017 and earlier. FINDINGS: Portable AP upright view at 0504 hours. Lordotic view. Stable cardiomegaly and mediastinal contours. Stable left chest cardiac AICD. Since 01/14/2017 pulmonary vascularity has not significantly changed. Basilar ventilation has slightly improved. No pneumothorax or large effusion. Negative visible bowel gas pattern. IMPRESSION:  Minimal improved ventilation since 01/14/2017.  Continued interstitial edema. Electronically Signed   By: Genevie Ann M.D.   On: 01/17/2017 06:42   Dg Chest Port 1 View  Result Date: 01/16/2017 CLINICAL DATA:  Congestive heart failure EXAM: PORTABLE CHEST 1 VIEW COMPARISON:  January 14, 2017 FINDINGS: There is cardiomegaly with pulmonary venous hypertension. Pacemaker leads are attached to the right atrium and right ventricle. There is a small right pleural effusion. There is patchy atelectasis in both lung bases. There is mild interstitial edema. There is focal airspace opacity in the right mid lung, new from 2 days prior. Patient is status post coronary artery bypass grafting. No bone lesions. IMPRESSION: Stable pulmonary vascular congestion. Small right pleural effusion. New airspace opacity in the right mid lung, potentially representing alveolar edema but concerning for focus of pneumonia. Mild interstitial edema persists. Areas of atelectatic change in the bases remain. Electronically Signed   By: Lowella Grip III M.D.   On: 01/16/2017 09:48   Telemetry Personally reviewed  Aflutter with variable conduction and then VT at 440 msec ( below detection rate of VT  EKG: atrial flutter with variable conduction  Chest Xray personally reviewed  CHF with dual chamber ICD Assessment and Plan:  Ventricular tachycardia]  Atrial flutter persistent but mostly controlled VR  Shock- low MVO2, but warm hands  Ischemic cardiomyopathy  Acute/chronic renal failure  Respiratory failure  ICD-St Jude   Device interrogation showed unusual programming with one zone 150-240   Have change VT detect to 180 BUT WILL NEED REPROGRAMMING PRIOR TO DISCHARGE AS HAS    VT CL 440 msec, which will be untreated  Would consider cardioversion, as best as we can tell, he has been off anticoagulation < 24 hrs ( last dose yday am  And has renal insufficiency)  Agree with heparin-- restoration of AV synchrony would be  expected to improve hemodynamics, although higher probability  of recurrence 2/2 catecholamines   His HR has been reasonably well controlled until just very recently  His hand cap refill is surprising given pressors and coox ? Mixed picture  Agree with need for repeat echo     Virl Axe

## 2017-01-30 NOTE — Progress Notes (Signed)
PT Cancellation Note  Patient Details Name: Darrell Black MRN: [id number] DOB: [birth year]   Cancelled Treatment:    Reason Eval/Treat Not Completed: Medical issues which prohibited therapy.  Patient s/p code blue with intubation.  RN informed that physical therapy will be discharged and to have MD put in new orders when patient is medically stable to tolerate physical activity.  Thank you.   Lonell Grandchild 01/27/2017, 9:27 AM

## 2017-01-30 NOTE — Consult Note (Addendum)
Advanced Heart Failure Team Consult Note   Primary Physician:Joel [name], MD Primary Cardiologist:  Dr. Domenic Polite  Reason for Consultation: Cardiac Arrest / A/C systolic CHF  HPI:    Darrell Black is seen today for evaluation of A/C systolic CHF and cardiac arrest at the request of Dr. Julianne Handler.   Darrell Black is a 61 y.o. male with h/o CAD, s/p CABG 0086, Systolic CHF due to ICM, S/p St Jude ICD, Paroxysmal Afib/AFL, Chronic Eliquis, HTN, HLD, Depression, DM2, Morbid obesity, CKD stage III, and OSA non-compliant with CPAP. Moved recently from [place].  Last seen in [facility] clinic 01/14/2017. Admitted to [facility] for IV diuresis with massive volume overload. Discharged 01/23/17 back to SNF. Discharge weight 332 lbs.  Pt presented to [facility] 01/10/2017 with respiratory distress. Admit weight 345 lbs. Found to be somnolent and hypoxic despite [place] O2 at 10 lpm.  Placed on BiPAP. Palliative care consult placed with rapid re-hospitilization. Pertinent labs on admision include K 3.7, Creatinine 3.46 (up from 2.02 on discharge 11/14), BNP 674, Troponin negative initially, Hgb 9.7, and WBC 6.3. CXR with pulmonary edema.   Pt developed PEA arrest ~ 1:15 pm on 01/29/17. Resuscitated and intubated. Thought to be respiratory related.  Cardiology consulted on 01/20/2017 for wide complex tachycardia following his arrest. Developed VT and had recurrent arrest, so transferred to [facility] for further eval and treatment.   Pt intubated and sedated. Pressures in 90s via ART line on levophed 30 and Neo at 50.  Coox 35% at 1100. Repeat pending. CVP being connected.  Troponin this am 0.15.   Spoke to [facility] personally. Multiple episodes of VT. Pt has been in AFL since end of July. VT cut off was set to 140. Now set to 180.   Echo 12/12/2016 LVEF 35-40%, Mild LAE, Mildly reduced RV, Mild TR, PA peak pressure 38 mm Hg.   PT is intubated and sedated. ROS obtained from chart and previous notes.   Review of Systems: [y] =  yes, _0  = no   General: Weight gain [y]; Weight loss _1 ; Anorexia _2 ; Fatigue _3 ; Fever _4 ; Chills _5 ; Weakness _6   Cardiac: Chest pain/pressure _7 ; Resting SOB [y]; Exertional SOB [y]; Orthopnea _8 ; Pedal Edema [y]; Palpitations _9 ; Syncope _10 ; Presyncope _11 ; Paroxysmal nocturnal dyspnea_12   Pulmonary: Cough _13 ; Wheezing_14 ; Hemoptysis_15 ; Sputum _16 ; Snoring _17   GI: Vomiting_18 ; Dysphagia_19 ; Melena_20 ; Hematochezia _21 ; Heartburn_22 ; Abdominal pain _23 ; Constipation _24 ; Diarrhea _25 ; BRBPR _26   GU: Hematuria_27 ; Dysuria _28 ; Nocturia_29   Vascular: Pain in legs with walking _30 ; Pain in feet with lying flat _31 ; Non-healing sores _32 ; Stroke _33 ; TIA _34 ; Slurred speech _35 ;  Neuro: Headaches_36 ; Vertigo_37 ; Seizures_38 ; Paresthesias_39 ;Blurred vision _40 ; Diplopia _41 ; Vision changes _42   Ortho/Skin: Arthritis [y]; Joint pain [y]; Muscle pain _43 ; Joint swelling _44 ; Back Pain _45 ; Rash _46   Psych: Depression_47 ; Anxiety_48   Heme: Bleeding problems _49 ; Clotting disorders _50 ; Anemia _51   Endocrine: Diabetes [y]; Thyroid dysfunction_52   Home Medications Prior to Admission medications   Medication Sig Start Date End Date Taking? Authorizing Provider  acetaminophen (TYLENOL) 325 MG tablet Take 650 mg by mouth every 6 (six) hours as needed.   Yes [provider]  amiodarone (PACERONE) 200 MG  tablet Take 200 mg by mouth daily.   Yes [provider]  apixaban (ELIQUIS) 5 MG TABS tablet Take 1 tablet (5 mg total) by mouth 2 (two) times daily. 12/31/16  Yes Isaac Bliss, Rayford Halsted, MD  atorvastatin (LIPITOR) 80 MG tablet Take 80 mg by mouth every evening.   Yes [provider]  carvedilol (COREG) 3.125 MG tablet Take 3.125 mg by mouth 2 (two) times daily with a meal.   Yes [provider]  docusate sodium (COLACE) 100 MG capsule Take 100 mg by mouth daily.   Yes [provider]  gabapentin (NEURONTIN) 100 MG capsule Take 1 capsule (100  mg total) by mouth 2 (two) times daily. 12/31/16  Yes Isaac Bliss, Rayford Halsted, MD  guaiFENesin (ROBITUSSIN) 100 MG/5ML SOLN Take 5 mLs every 8 (eight) hours as needed by mouth for cough or to loosen phlegm.   Yes [provider]  ipratropium-albuterol (DUONEB) 0.5-2.5 (3) MG/3ML SOLN Take 3 mLs every 6 (six) hours as needed by nebulization (shortness of breath).   Yes [provider]  metolazone (ZAROXOLYN) 5 MG tablet Take 1 tablet (5 mg total) every other day by mouth. 01/23/17  Yes Isaac Bliss, Rayford Halsted, MD  nitroGLYCERIN (NITROSTAT) 0.4 MG SL tablet Place 0.4 mg under the tongue every 5 (five) minutes as needed for chest pain.   Yes [provider]  rOPINIRole (REQUIP) 1 MG tablet Take 1 mg by mouth at bedtime.   Yes [provider]  senna (SENOKOT) 8.6 MG TABS tablet Take 1 tablet by mouth at bedtime.   Yes [provider]  tiotropium (SPIRIVA) 18 MCG inhalation capsule Place 18 mcg into inhaler and inhale daily.   Yes [provider]  torsemide (DEMADEX) 100 MG tablet Take 1 tablet (100 mg total) daily by mouth. 01/24/17  Yes Isaac Bliss, Rayford Halsted, MD    Past Medical History: Past Medical History:  Diagnosis Date  . Atherosclerosis   . Atrial fibrillation ([place])   . 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 Chest pain   . Chronic combined systolic and diastolic heart failure ([place])    a. 12/2016: echo showing EF of 35-40% , diffuse HK, mild TR, and PA pressure of 38 mm Hg  . Chronic pain   . Coronary artery disease    a. s/p CABG in 2001  . Diabetes mellitus without complication ([place])    type two  . Hyperlipidemia   . Hypertension    essential  . Ischemic cardiomyopathy   . Major depressive disorder, recurrent ([place])   . Morbid obesity due to excess calories ([place])   . Muscle weakness (generalized)   . Neuropathy   . Obstructive sleep apnea   . Presence of automatic cardioverter/defibrillator  (AICD)   . Renal disorder    Chronic kidney disease  . Restless leg syndrome   . Sick-euthyroid syndrome     Past Surgical History: Past Surgical History:  Procedure Laterality Date  . COLONOSCOPY N/A 12/20/2016   Procedure: COLONOSCOPY;  Surgeon: Mauri Pole, MD;  Location: [facility] ENDOSCOPY;  Service: Endoscopy;  Laterality: N/A;    Family History: Family History  Problem Relation Age of Onset  . Hypertension Father   . Cancer Maternal Grandmother   . Alcohol abuse Maternal Grandfather     Social History: Social History   Socioeconomic History  . Marital status: Single    Spouse name: None  . Number of children: None  .  Years of education: None  . Highest education level: None  Social Needs  . Financial resource strain: None  . Food insecurity - worry: None  . Food insecurity - inability: None  . Transportation needs - medical: None  . Transportation needs - non-medical: None  Occupational History  . None  Tobacco Use  . Smoking status: Never Smoker  . Smokeless tobacco: Never Used  Substance and Sexual Activity  . Alcohol use: No  . Drug use: No  . Sexual activity: No  Other Topics Concern  . None  Social History Narrative  . None    Allergies:  Allergies  Allergen Reactions  . Fish Allergy   . Ampicillin Rash  . Clindamycin/Lincomycin Rash  . Penicillins Rash    Has patient had a PCN reaction causing immediate rash, facial/tongue/throat swelling, SOB or lightheadedness with hypotension: Yes Has patient had a PCN reaction causing severe rash involving mucus membranes or skin necrosis: No Has patient had a PCN reaction that required hospitalization: No Has patient had a PCN reaction occurring within the last 10 years: No If all of the above answers are "NO", then may proceed with Cephalosporin use.     Objective:    Vital Signs:   Temp:  [98.7 F (37.1 C)-100.1 F (37.8 C)] 98.7 F (37.1 C) (11/21 1152) Pulse Rate:  [68-135] 86 (11/21  1152) Resp:  [0-26] 13 (11/21 0800) BP: (57-155)/(23-116) 118/73 (11/21 1152) SpO2:  [87 %-100 %] 94 % (11/21 1541) Arterial Line BP: (119)/(50) 119/50 (11/21 1500) FiO2 (%):  [100 %] 100 % (11/21 1541) Weight:  [345 lb 3.9 oz (156.6 kg)] 345 lb 3.9 oz (156.6 kg) (11/21 0424) Last BM Date: 01/27/17  Weight change: Filed Weights   01/29/2017 1854 01/29/17 0402 01/25/2017 0424  Weight: (!) 344 lb 9.3 oz (156.3 kg) (!) 345 lb 14.4 oz (156.9 kg) (!) 345 lb 3.9 oz (156.6 kg)    Intake/Output:   Intake/Output Summary (Last 24 hours) at 01/21/2017 1622 Last data filed at 02/08/2017 1445 Gross per 24 hour  Intake 1183.91 ml  Output 1100 ml  Net 83.91 ml      Physical Exam    General: Intubated/sedated.  HEENT: + ETT Neck: Supple. JVP ~8. Carotids 2+ bilat; no bruits. No thyromegaly or nodule noted. Cor: PMI nondisplaced. Tachy, irregularly irregular.  Lungs: Diminished basilar sounds + Mechanical breathing sounds.  Abdomen: Soft, non-tender, non-distended, no HSM. No bruits or masses. +BS  Extremities: No cyanosis, clubbing, or rash. Trace ankle edema.  Neuro: Intubated/sedated   Telemetry   AFL 90-100s, Personally reviewed  EKG    Aflutter 110s, personally reviewed  Labs   Basic Metabolic Panel: Recent Labs  Lab 01/29/17 0454 01/29/17 1342 01/29/17 2115 01/21/2017 0533 01/12/2017 0740 01/25/2017 1134  NA 135 135 135 135  --  134*  K 3.4* 3.3* 3.7 3.5  --  3.5  CL 78* 77* 78* 78*  --  77*  CO2 44* 43* 44* 46*  --  47*  GLUCOSE 106* 188* 136* 126*  --  135*  BUN 93* 94* 96* 97*  --  96*  CREATININE 3.49* 3.78* 3.65* 3.46*  --  3.56*  CALCIUM 8.0* 7.8* 8.0* 8.2*  --  8.0*  MG  --  1.7 1.6*  --  1.6*  --     Liver Function Tests: Recent Labs  Lab 01/14/2017 1256  AST 22  ALT 18  ALKPHOS 132*  BILITOT 1.4*  PROT 6.4*  ALBUMIN 3.5  No results for input(s): LIPASE, AMYLASE in the last 168 hours. No results for input(s): AMMONIA in the last 168  hours.  CBC: Recent Labs  Lab 01/31/2017 1256 01/29/17 0454 01/29/17 1342  WBC 6.3 5.6 9.3  NEUTROABS 5.1 4.5  --   HGB 9.7* 10.1* 9.9*  HCT 33.4* 35.4* 34.7*  MCV 101.2* 102.9* 103.3*  PLT 149* 134* 172    Cardiac Enzymes: Recent Labs  Lab 02/03/2017 1316 01/29/17 1342 01/26/2017 0742  TROPONINI <0.03 <0.03 0.15*    BNP: BNP (last 3 results) Recent Labs    12/11/16 2330 01/14/17 1436 01/18/2017 1316  BNP 583.0* 687.0* 674.0*    ProBNP (last 3 results) No results for input(s): PROBNP in the last 8760 hours.   CBG: Recent Labs  Lab 01/29/17 2350 02/06/2017 0337 01/15/2017 0740 01/16/2017 1119 01/11/2017 1522  GLUCAP 142* 144* 126* 129* 119*    Coagulation Studies: Recent Labs    01/29/17 2113/05/31 01/29/2017 0533  LABPROT 25.5* 25.2*  INR 2.35 2.31     Imaging   Dg Chest Port 1 View  Result Date: 01/19/2017 CLINICAL DATA:  Hypoxia. EXAM: PORTABLE CHEST 1 VIEW COMPARISON:  01/20/2017 and 01/29/2017. FINDINGS: 1240 hour. Endotracheal tube appears unchanged at the mid trachea. Left subclavian pacemaker leads in right upper arm PICC appear unchanged. Nasogastric tube is in place, tip not visualized. There is stable cardiomegaly status post median sternotomy. There is vascular congestion with probable mild edema, small bilateral pleural effusions and bibasilar atelectasis. No pneumothorax. IMPRESSION: Similar appearance of the chest with suspected congestive heart failure manifesting as pulmonary edema and bilateral pleural effusions. Endotracheal tube appears satisfactorily positioned. Tip of the enteric tube is not visualized. Electronically Signed   By: Richardean Sale M.D.   On: 01/18/2017 12:59   Dg Chest Port 1 View  Result Date: 02/03/2017 CLINICAL DATA:  Ventilator patient. Atrial fibrillation, diabetes, hypertension, nonsmoker, enteric tube placement EXAM: PORTABLE CHEST 1 VIEW COMPARISON:  01/29/2017 FINDINGS: Postoperative changes in the mediastinum. Cardiac  pacemaker. Endotracheal tube with tip measuring 3.5 cm above the carina. Enteric tube is only segmentally visualized due to technique but probably extends to the left upper quadrant. Cardiac enlargement with mild vascular congestion. Shallow inspiration with atelectasis in the lung bases. Probable small bilateral pleural effusions. No pneumothorax. IMPRESSION: Appliances appear in satisfactory location although the enteric tube is incompletely visualized. Cardiac enlargement with pulmonary vascular congestion and bilateral pleural effusions. Atelectasis in the lung bases. Electronically Signed   By: Lucienne Capers M.D.   On: 01/31/2017 05:29     Medications:     Current Medications: . etomidate      . fentaNYL      . midazolam      . aspirin EC  81 mg Oral Daily  . atorvastatin  80 mg Per Tube QPM  . chlorhexidine gluconate (MEDLINE KIT)  15 mL Mouth Rinse BID  . Chlorhexidine Gluconate Cloth  6 each Topical Daily  . insulin aspart  0-20 Units Subcutaneous Q4H  . ipratropium-albuterol  3 mL Nebulization Q6H  . mouth rinse  15 mL Mouth Rinse 10 times per day  . pantoprazole (PROTONIX) IV  40 mg Intravenous Daily  . senna  1 tablet Oral QHS  . sodium chloride flush  10-40 mL Intracatheter Q12H  . sodium chloride flush  3 mL Intravenous Q12H    Infusions: . sodium chloride 250 mL (01/24/2017 0900)  . amiodarone    . fentaNYL infusion INTRAVENOUS Stopped (01/21/2017 1445)  .  heparin 1,500 Units/hr (01/29/2017 1558)  . magnesium sulfate 1 - 4 g bolus IVPB 2 g (01/19/2017 1559)  . phenylephrine (NEO-SYNEPHRINE) Adult infusion 50 mcg/min (01/27/2017 1512)  . propofol Stopped (01/25/2017 1445)      Patient Profile   Darrell Black is a 61 y.o. male with h/o CAD, s/p CABG 8366, Systolic CHF due to ICM, S/p St Jude ICD, Paroxysmal Afib/AFL, Chronic Eliquis, HTN, HLD, Depression, DM2, Morbid obesity, CKD stage III, and OSA non-compliant with CPAP. Moved recently from [place].  Pt admitted to [facility]  01/27/2017 with respiratory distress. PEA arrest 01/29/17. Transferred to [facility] 01/13/2017 with VT.   Assessment/Plan   1. PEA arrest  - in setting of respiratory arrest/hypoxia despite BiPAP support. No clear notes how long resuscitation efforts lasted. Started on levophed after with hypotension.  - No further arrest.  - Remains on amiodarone - Keep K > 4.0 and Mg > 2.0. Will supp both  2. Acute on chronic systolic CHF -> Cardiogenic shock - Echo 12/12/2016 LVEF 35-40%, Mild LAE, Mildly reduced RV, Mild TR, PA peak pressure 38 mm Hg.  - Repeat echo pending.  - Known ICM. Coox 35% this am on levophed 30 and neosynephrine 50. Attempting wean of levophed in setting of VT.  - May need inotrope support, but could worsen his arrythmias. Will discuss with MD.  - Markedly volume overloaded. Awaiting CVP connection.  - He is not good candidate for cath at this point.  - Will likely need to try and diurese as tolerated. May need lasix gtt for slow and steady diuresis.  - He would be a poor candidate for advanced therapies with his co-morbidities and social.   3. Acute on chronic respiratory failure - Intubated and sedated. CCM attending.  4. Aflutter with RVR - Rate has average in the 140s. He has been anticoagulated as far as we know on Eliquis at SNF, though has missed 2 doses s/p arrest.  - May benefit from attempts to cardiovert.  Would likely require TEE at this point.  - Continue amiodarone gtt.   5. CAD s/p CABG 2001 - No clear s/s ischemia prior to admit.   - Troponin 0.15 this am in setting of arrest. Repeat pending.  - Would defer ischemic work up for now with AKI and instability.   6. 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   7. GOC - Pt had been referred to palliative care at [facility] trying to reach family to discuss   Patient is critically ill and remains in multiple system organ failure. Prognosis extremely guarded.   Medication concerns reviewed with patient  and pharmacy team. Barriers identified: Not at this time. Admitted from SNF.   Length of Stay: 2  Annamaria Helling  01/31/2017, 4:22 PM  Advanced Heart Failure Team Pager [phone] (M-F; 7a - 4p)  Please contact [facility] for night-coverage after hours (4p -7a ) and weekends on [email or web address]  Patient seen with PA, agree with the above note.    Discussed with Dr. Caryl Comes: Patient has had minimal time off anticoagulation and is now on heparin gtt.  I cardioverted him out of atrial flutter back to NSR with 200 J synchronized shock after sedation with Versed, Fentanyl, and etomidate.   1. Cardiogenic shock: St Jude ICD.  EF 35-40% (ischemic cardiomyopathy) in 10/18.  Admitted to [facility] with respiratory distress/volume overload.  PEA arrest at [facility] 11/20, intubated.  Runs of VT at [facility].  Transferred  to [facility].  Of note, he has been in atrial flutter, at times with RVR, since 7/18.  Co-ox 35% this morning.  Currently on norepinephrine 30 and phenylephrine 50 with co-ox now up to 67% and CVP is 19 (volume overloaded).  Bedside echo with EF in the 20% range.  SBP up to 110s after DCCV back to NSR. There is some question of possible component of septic shock with low grade fever but WBCs not elevated.   - Will get formal echo this evening.  - Wean phenylephrine as able, continue norepinephrine for now.  - Needs diuresis => Lasix 80 mg IV x 1 then 12 mg/hr gtt.  - Unlikely to be candidate for advanced therapies with limited social support and AKI on CKD stage 3.  - Check procalcitonin (?of septic shock).  - For now, would hold off on Swan as suspect cardiogenic shock and patient doing better in NSR. Can follow CVP and co-ox off PICC.  2. Atrial flutter: Persistent since 7/18, RVR at times.  He has missed only 2 doses Eliquis recently and has been started on heparin gtt (so off anticoagulation for < 24 hrs).  We decided that it would be reasonable to proceed with DCCV given cardiogenic shock in the  absence of TEE.  He was sedated and successfully cardioverted to NSR.  BP immediately increased, suggesting that atrial kick is very important for him.  - Continue amiodarone gtt.  - Continue heparin gtt for now.  3. CAD: s/p CABG 2001.  No chest pain prior to PEA arrest, TnI 0.15.  Worsening EF and shock, would ideally eventually have his coronaries evaluated.  However, with AKI and no STEMI, will need to wait until he clinically stabilizes.  - Continue ASA 81 and statin.  4. PEA arrest: Suspect respiratory driven, pulmonary edema.  5. VT: Patient has had runs of VT.  Rate around 135, has not triggered ICD therapies.  - Now on amiodarone gtt.  6. ID: See discussion above.  Low grade fever around 100 last night, now afebrile.  WBCs not significantly elevated.  Suspect cardiogenic shock, cannot totally rule out component of septic shock but less likely.  CXR with pulmonary edema.  - Send procalcitonin.  7. AKI on CKD stage 3: Suspect AKI is hemodynamically mediated due to shock.  Hopefully will improve with diuresis/inotropes/NSR.   Loralie Champagne 02/06/2017 5:05 PM

## 2017-01-30 NOTE — Progress Notes (Signed)
Subjective: He had trouble overnight with cardiac arrhythmias.  His magnesium level was low late last night and he did receive magnesium replacement.  This morning his potassium is 3.5.  His defibrillator did not fire during his cardiac arrhythmias.  Objective: Vital signs in last 24 hours: Temp:  [94.7 F (34.8 C)-100.1 F (37.8 C)] 99.8 F (37.7 C) (11/21 0600) Pulse Rate:  [68-115] 110 (11/21 0600) Resp:  [0-28] 20 (11/21 0600) BP: (57-155)/(23-116) 99/78 (11/21 0600) SpO2:  [87 %-100 %] 98 % (11/21 0600) FiO2 (%):  [50 %-100 %] 100 % (11/21 0433) Weight:  [156.6 kg (345 lb 3.9 oz)] 156.6 kg (345 lb 3.9 oz) (11/21 0424) Weight change: 6.006 kg (13 lb 3.9 oz) Last BM Date: 01/27/17  Intake/Output from previous day: 11/20 0701 - 11/21 0700 In: 1200.1 [P.O.:600; I.V.:550.1; IV Piggyback:50] Out: 1750 [Urine:1750]  PHYSICAL EXAM General appearance: Intubated sedated morbidly obese on mechanical ventilation Resp: rales bilaterally and rhonchi bilaterally Cardio: He has tachycardia to about 110.  Questionable S3 gallop. GI: Obese soft faint bowel sounds heard Extremities: His edema is essentially unchanged Cannot assess neurology exam  Lab Results:  Results for orders placed or performed during the hospital encounter of 01/16/2017 (from the past 48 hour(s))  Urinalysis, Complete w Microscopic     Status: Abnormal   Collection Time: 02/01/2017 12:53 PM  Result Value Ref Range   Color, Urine YELLOW YELLOW   APPearance CLEAR CLEAR   Specific Gravity, Urine 1.012 1.005 - 1.030   pH 5.0 5.0 - 8.0   Glucose, UA NEGATIVE NEGATIVE mg/dL   Hgb urine dipstick NEGATIVE NEGATIVE   Bilirubin Urine NEGATIVE NEGATIVE   Ketones, ur NEGATIVE NEGATIVE mg/dL   Protein, ur NEGATIVE NEGATIVE mg/dL   Nitrite NEGATIVE NEGATIVE   Leukocytes, UA NEGATIVE NEGATIVE   RBC / HPF 6-30 0 - 5 RBC/hpf   WBC, UA 0-5 0 - 5 WBC/hpf   Bacteria, UA RARE (A) NONE SEEN   Squamous Epithelial / LPF 0-5 (A) NONE  SEEN   Hyaline Casts, UA PRESENT   Comprehensive metabolic panel     Status: Abnormal   Collection Time: 02/06/2017 12:56 PM  Result Value Ref Range   Sodium 133 (L) 135 - 145 mmol/L   Potassium 3.7 3.5 - 5.1 mmol/L   Chloride 77 (L) 101 - 111 mmol/L   CO2 45 (H) 22 - 32 mmol/L   Glucose, Bld 103 (H) 65 - 99 mg/dL   BUN 90 (H) 6 - 20 mg/dL   Creatinine, Ser 3.46 (H) 0.61 - 1.24 mg/dL   Calcium 8.0 (L) 8.9 - 10.3 mg/dL   Total Protein 6.4 (L) 6.5 - 8.1 g/dL   Albumin 3.5 3.5 - 5.0 g/dL   AST 22 15 - 41 U/L   ALT 18 17 - 63 U/L   Alkaline Phosphatase 132 (H) 38 - 126 U/L   Total Bilirubin 1.4 (H) 0.3 - 1.2 mg/dL   GFR calc non Af Amer 18 (L) >60 mL/min   GFR calc Af Amer 20 (L) >60 mL/min    Comment: (NOTE) The eGFR has been calculated using the CKD EPI equation. This calculation has not been validated in all clinical situations. eGFR's persistently <60 mL/min signify possible Chronic Kidney Disease.    Anion gap 11 5 - 15  CBC WITH DIFFERENTIAL     Status: Abnormal   Collection Time: 01/15/2017 12:56 PM  Result Value Ref Range   WBC 6.3 4.0 - 10.5 K/uL   RBC  3.30 (L) 4.22 - 5.81 MIL/uL   Hemoglobin 9.7 (L) 13.0 - 17.0 g/dL   HCT 33.4 (L) 39.0 - 52.0 %   MCV 101.2 (H) 78.0 - 100.0 fL   MCH 29.4 26.0 - 34.0 pg   MCHC 29.0 (L) 30.0 - 36.0 g/dL   RDW 16.8 (H) 11.5 - 15.5 %   Platelets 149 (L) 150 - 400 K/uL   Neutrophils Relative % 81 %   Neutro Abs 5.1 1.7 - 7.7 K/uL   Lymphocytes Relative 15 %   Lymphs Abs 0.9 0.7 - 4.0 K/uL   Monocytes Relative 3 %   Monocytes Absolute 0.2 0.1 - 1.0 K/uL   Eosinophils Relative 1 %   Eosinophils Absolute 0.1 0.0 - 0.7 K/uL   Basophils Relative 0 %   Basophils Absolute 0.0 0.0 - 0.1 K/uL  CBG monitoring, ED     Status: Abnormal   Collection Time: 02/05/2017  1:05 PM  Result Value Ref Range   Glucose-Capillary 110 (H) 65 - 99 mg/dL  I-Stat CG4 Lactic Acid, ED     Status: None   Collection Time: 01/21/2017  1:06 PM  Result Value Ref Range    Lactic Acid, Venous 1.18 0.5 - 1.9 mmol/L  Blood gas, venous     Status: Abnormal   Collection Time: 02/06/2017  1:10 PM  Result Value Ref Range   O2 Content 15.0 L/min   Delivery systems NASAL CANNULA    pH, Ven 7.444 (H) 7.250 - 7.430   pCO2, Ven 66.4 (H) 44.0 - 60.0 mmHg   pO2, Ven 104.0 (H) 32.0 - 45.0 mmHg   Bicarbonate 42.1 (H) 20.0 - 28.0 mmol/L   Acid-Base Excess 19.2 (H) 0.0 - 2.0 mmol/L   O2 Saturation 98.1 %   Collection site NOT INDICATED    Drawn by DRAWN BY RN    Sample type VENOUS   Troponin I     Status: None   Collection Time: 01/22/2017  1:16 PM  Result Value Ref Range   Troponin I <0.03 <0.03 ng/mL  Brain natriuretic peptide     Status: Abnormal   Collection Time: 01/16/2017  1:16 PM  Result Value Ref Range   B Natriuretic Peptide 674.0 (H) 0.0 - 100.0 pg/mL  Basic metabolic panel     Status: Abnormal   Collection Time: 01/29/17  4:54 AM  Result Value Ref Range   Sodium 135 135 - 145 mmol/L   Potassium 3.4 (L) 3.5 - 5.1 mmol/L   Chloride 78 (L) 101 - 111 mmol/L   CO2 44 (H) 22 - 32 mmol/L   Glucose, Bld 106 (H) 65 - 99 mg/dL   BUN 93 (H) 6 - 20 mg/dL   Creatinine, Ser 3.49 (H) 0.61 - 1.24 mg/dL   Calcium 8.0 (L) 8.9 - 10.3 mg/dL   GFR calc non Af Amer 17 (L) >60 mL/min   GFR calc Af Amer 20 (L) >60 mL/min    Comment: (NOTE) The eGFR has been calculated using the CKD EPI equation. This calculation has not been validated in all clinical situations. eGFR's persistently <60 mL/min signify possible Chronic Kidney Disease.    Anion gap 13 5 - 15  CBC with Differential/Platelet     Status: Abnormal   Collection Time: 01/29/17  4:54 AM  Result Value Ref Range   WBC 5.6 4.0 - 10.5 K/uL   RBC 3.44 (L) 4.22 - 5.81 MIL/uL   Hemoglobin 10.1 (L) 13.0 - 17.0 g/dL   HCT 35.4 (L)  39.0 - 52.0 %   MCV 102.9 (H) 78.0 - 100.0 fL   MCH 29.4 26.0 - 34.0 pg   MCHC 28.5 (L) 30.0 - 36.0 g/dL   RDW 17.1 (H) 11.5 - 15.5 %   Platelets 134 (L) 150 - 400 K/uL   Neutrophils  Relative % 80 %   Neutro Abs 4.5 1.7 - 7.7 K/uL   Lymphocytes Relative 7 %   Lymphs Abs 0.4 (L) 0.7 - 4.0 K/uL   Monocytes Relative 12 %   Monocytes Absolute 0.7 0.1 - 1.0 K/uL   Eosinophils Relative 1 %   Eosinophils Absolute 0.0 0.0 - 0.7 K/uL   Basophils Relative 0 %   Basophils Absolute 0.0 0.0 - 0.1 K/uL  Glucose, capillary     Status: Abnormal   Collection Time: 01/29/17  9:27 AM  Result Value Ref Range   Glucose-Capillary 100 (H) 65 - 99 mg/dL  Glucose, capillary     Status: Abnormal   Collection Time: 01/29/17 11:21 AM  Result Value Ref Range   Glucose-Capillary 139 (H) 65 - 99 mg/dL  Troponin I     Status: None   Collection Time: 01/29/17  1:42 PM  Result Value Ref Range   Troponin I <0.03 <0.03 ng/mL  CBC     Status: Abnormal   Collection Time: 01/29/17  1:42 PM  Result Value Ref Range   WBC 9.3 4.0 - 10.5 K/uL   RBC 3.36 (L) 4.22 - 5.81 MIL/uL   Hemoglobin 9.9 (L) 13.0 - 17.0 g/dL   HCT 34.7 (L) 39.0 - 52.0 %   MCV 103.3 (H) 78.0 - 100.0 fL   MCH 29.5 26.0 - 34.0 pg   MCHC 28.5 (L) 30.0 - 36.0 g/dL   RDW 17.0 (H) 11.5 - 15.5 %   Platelets 172 150 - 400 K/uL  Basic metabolic panel     Status: Abnormal   Collection Time: 01/29/17  1:42 PM  Result Value Ref Range   Sodium 135 135 - 145 mmol/L   Potassium 3.3 (L) 3.5 - 5.1 mmol/L   Chloride 77 (L) 101 - 111 mmol/L   CO2 43 (H) 22 - 32 mmol/L   Glucose, Bld 188 (H) 65 - 99 mg/dL   BUN 94 (H) 6 - 20 mg/dL   Creatinine, Ser 3.78 (H) 0.61 - 1.24 mg/dL   Calcium 7.8 (L) 8.9 - 10.3 mg/dL   GFR calc non Af Amer 16 (L) >60 mL/min   GFR calc Af Amer 18 (L) >60 mL/min    Comment: (NOTE) The eGFR has been calculated using the CKD EPI equation. This calculation has not been validated in all clinical situations. eGFR's persistently <60 mL/min signify possible Chronic Kidney Disease.    Anion gap 15 5 - 15  Magnesium     Status: None   Collection Time: 01/29/17  1:42 PM  Result Value Ref Range   Magnesium 1.7 1.7 -  2.4 mg/dL  Blood gas, arterial     Status: Abnormal   Collection Time: 01/29/17  2:00 PM  Result Value Ref Range   FIO2 100.00    Delivery systems VENTILATOR    Mode PRESSURE REGULATED VOLUME CONTROL    VT 550 mL   LHR 16 resp/min   Peep/cpap 5.0 cm H20   pH, Arterial 7.309 (L) 7.350 - 7.450   pCO2 arterial 83.6 (HH) 32.0 - 48.0 mmHg    Comment: CRITICAL RESULT CALLED TO, READ BACK BY AND VERIFIED WITH: REYNOLDS,W.RN AT 1410 01/29/17  BY BROADNAX,L.RRT    pO2, Arterial 75.1 (L) 83.0 - 108.0 mmHg   Bicarbonate 35.9 (H) 20.0 - 28.0 mmol/L   Acid-Base Excess 14.1 (H) 0.0 - 2.0 mmol/L   O2 Saturation 92.8 %   Collection site LEFT RADIAL    Drawn by [id number]    Sample type ARTERIAL DRAW    Allens test (pass/fail) PASS PASS  Draw ABG 1 hour after initiation of ventilator     Status: Abnormal   Collection Time: 01/29/17  3:40 PM  Result Value Ref Range   FIO2 100.00    Delivery systems VENTILATOR    Mode PRESSURE REGULATED VOLUME CONTROL    VT 550 mL   LHR 16 resp/min   Peep/cpap 5.0 cm H20   pH, Arterial 7.425 7.350 - 7.450   pCO2 arterial 69.8 (HH) 32.0 - 48.0 mmHg    Comment: CRITICAL RESULT CALLED TO, READ BACK BY AND VERIFIED WITH: MOTLEY,J.RN AT 1600 01/29/17 BY BROADNAX,L.RRT    pO2, Arterial 104.00 83.0 - 108.0 mmHg   Bicarbonate 42.1 (H) 20.0 - 28.0 mmol/L   Acid-Base Excess 19.1 (H) 0.0 - 2.0 mmol/L   O2 Saturation 97.9 %   Collection site LEFT RADIAL    Drawn by [id number]    Sample type ARTERIAL DRAW    Allens test (pass/fail) PASS PASS  Glucose, capillary     Status: Abnormal   Collection Time: 01/29/17  4:27 PM  Result Value Ref Range   Glucose-Capillary 126 (H) 65 - 99 mg/dL  Lactic acid, plasma     Status: Abnormal   Collection Time: 01/29/17  5:11 PM  Result Value Ref Range   Lactic Acid, Venous 2.0 (HH) 0.5 - 1.9 mmol/L    Comment: CRITICAL RESULT CALLED TO, READ BACK BY AND VERIFIED WITH: MARTIN,K ON 01/29/17 AT 1815 BY LOY,C   Glucose, capillary      Status: Abnormal   Collection Time: 01/29/17  7:42 PM  Result Value Ref Range   Glucose-Capillary 123 (H) 65 - 99 mg/dL   Comment 1 Notify RN   Basic metabolic panel     Status: Abnormal   Collection Time: 01/29/17  9:15 PM  Result Value Ref Range   Sodium 135 135 - 145 mmol/L   Potassium 3.7 3.5 - 5.1 mmol/L   Chloride 78 (L) 101 - 111 mmol/L   CO2 44 (H) 22 - 32 mmol/L   Glucose, Bld 136 (H) 65 - 99 mg/dL   BUN 96 (H) 6 - 20 mg/dL   Creatinine, Ser 3.65 (H) 0.61 - 1.24 mg/dL   Calcium 8.0 (L) 8.9 - 10.3 mg/dL   GFR calc non Af Amer 17 (L) >60 mL/min   GFR calc Af Amer 19 (L) >60 mL/min    Comment: (NOTE) The eGFR has been calculated using the CKD EPI equation. This calculation has not been validated in all clinical situations. eGFR's persistently <60 mL/min signify possible Chronic Kidney Disease.    Anion gap 13 5 - 15  Magnesium     Status: Abnormal   Collection Time: 01/29/17  9:15 PM  Result Value Ref Range   Magnesium 1.6 (L) 1.7 - 2.4 mg/dL  Protime-INR     Status: Abnormal   Collection Time: 01/29/17  9:15 PM  Result Value Ref Range   Prothrombin Time 25.5 (H) 11.4 - 15.2 seconds   INR 2.35   Glucose, capillary     Status: Abnormal   Collection Time: 01/29/17 11:50 PM  Result Value Ref Range  Glucose-Capillary 142 (H) 65 - 99 mg/dL   Comment 1 Notify RN   Glucose, capillary     Status: Abnormal   Collection Time: 01/13/2017  3:37 AM  Result Value Ref Range   Glucose-Capillary 144 (H) 65 - 99 mg/dL   Comment 1 Notify RN   Blood gas, arterial     Status: Abnormal   Collection Time: 02/04/2017  4:35 AM  Result Value Ref Range   FIO2 100.00    Delivery systems VENTILATOR    Mode PRESSURE REGULATED VOLUME CONTROL    VT 550 mL   LHR 16.0 resp/min   Peep/cpap 5.0 cm H20   pH, Arterial 7.493 (H) 7.350 - 7.450   pCO2 arterial 62.7 (H) 32.0 - 48.0 mmHg    Comment: CRITICAL RESULT CALLED TO, READ BACK BY AND VERIFIED WITH: Bethann Humble RN AT 9381 01/26/2017 PITTMAN,S RRT     pO2, Arterial 77.6 (L) 83.0 - 108.0 mmHg   Bicarbonate 44.5 (H) 20.0 - 28.0 mmol/L   Acid-Base Excess 21.9 (H) 0.0 - 2.0 mmol/L   O2 Saturation 94.5 %   Patient temperature 37.6    Collection site LEFT RADIAL    Drawn by [id number]    Sample type ARTERIAL    Allens test (pass/fail) PASS PASS  Basic metabolic panel     Status: Abnormal   Collection Time: 01/15/2017  5:33 AM  Result Value Ref Range   Sodium 135 135 - 145 mmol/L   Potassium 3.5 3.5 - 5.1 mmol/L   Chloride 78 (L) 101 - 111 mmol/L   CO2 46 (H) 22 - 32 mmol/L   Glucose, Bld 126 (H) 65 - 99 mg/dL   BUN 97 (H) 6 - 20 mg/dL   Creatinine, Ser 3.46 (H) 0.61 - 1.24 mg/dL   Calcium 8.2 (L) 8.9 - 10.3 mg/dL   GFR calc non Af Amer 18 (L) >60 mL/min   GFR calc Af Amer 20 (L) >60 mL/min    Comment: (NOTE) The eGFR has been calculated using the CKD EPI equation. This calculation has not been validated in all clinical situations. eGFR's persistently <60 mL/min signify possible Chronic Kidney Disease.    Anion gap 11 5 - 15  Protime-INR     Status: Abnormal   Collection Time: 01/10/2017  5:33 AM  Result Value Ref Range   Prothrombin Time 25.2 (H) 11.4 - 15.2 seconds   INR 2.31   Glucose, capillary     Status: Abnormal   Collection Time: 02/01/2017  7:40 AM  Result Value Ref Range   Glucose-Capillary 126 (H) 65 - 99 mg/dL    ABGS Recent Labs    01/10/2017 0435  PHART 7.493*  PO2ART 77.6*  HCO3 44.5*   CULTURES No results found for this or any previous visit (from the past 240 hour(s)). Studies/Results: Dg Chest Port 1 View  Result Date: 01/27/2017 CLINICAL DATA:  Ventilator patient. Atrial fibrillation, diabetes, hypertension, nonsmoker, enteric tube placement EXAM: PORTABLE CHEST 1 VIEW COMPARISON:  01/29/2017 FINDINGS: Postoperative changes in the mediastinum. Cardiac pacemaker. Endotracheal tube with tip measuring 3.5 cm above the carina. Enteric tube is only segmentally visualized due to technique but probably extends to  the left upper quadrant. Cardiac enlargement with mild vascular congestion. Shallow inspiration with atelectasis in the lung bases. Probable small bilateral pleural effusions. No pneumothorax. IMPRESSION: Appliances appear in satisfactory location although the enteric tube is incompletely visualized. Cardiac enlargement with pulmonary vascular congestion and bilateral pleural effusions. Atelectasis in the lung bases. Electronically  Signed   By: Lucienne Capers M.D.   On: 01/20/2017 05:29   Dg Chest Port 1 View  Result Date: 01/29/2017 CLINICAL DATA:  Status post intubation. EXAM: PORTABLE CHEST 1 VIEW COMPARISON:  Radiograph of November 28, 2016. FINDINGS: Stable cardiomegaly with central pulmonary vascular congestion is noted. Endotracheal tube is seen projected over tracheal air shadow with distal tip 2 cm above the carina. Nasogastric tube is seen entering stomach. Left-sided pacemaker is unchanged in position. Increased bilateral lung opacities are noted concerning for worsening pulmonary edema. Minimal bilateral pleural effusions are noted. Bony thorax is unremarkable. IMPRESSION: Endotracheal tube in grossly good position. Stable cardiomegaly with central pulmonary vascular congestion and bilateral pulmonary edema. Minimal bilateral pleural effusions are noted. Electronically Signed   By: Marijo Conception, M.D.   On: 01/29/2017 14:08   Dg Chest Port 1 View  Result Date: 01/17/2017 CLINICAL DATA:  Respiratory distress. History of atrial fibrillation, CHF, coronary artery disease, nonsmoker. EXAM: PORTABLE CHEST 1 VIEW COMPARISON:  Portable chest x-ray of January 17, 2017 FINDINGS: The lungs remain hypoinflated which is accentuated by the lordotic positioning. The interstitial markings remain increased. The pulmonary vascularity remains engorged and the cardiac silhouette enlarged. The ICD is in stable position. There is a probable small right pleural effusion. IMPRESSION: CHF with pulmonary  interstitial edema. There has been slight deterioration since the previous study. Electronically Signed   By: David  Martinique M.D.   On: 01/14/2017 14:11    Medications:  Prior to Admission:  Medications Prior to Admission  Medication Sig Dispense Refill Last Dose  . acetaminophen (TYLENOL) 325 MG tablet Take 650 mg by mouth every 6 (six) hours as needed.   unknown  . amiodarone (PACERONE) 200 MG tablet Take 200 mg by mouth daily.   01/26/2017 at Unknown time  . apixaban (ELIQUIS) 5 MG TABS tablet Take 1 tablet (5 mg total) by mouth 2 (two) times daily. 60 tablet  01/26/2017 at Unknown time  . atorvastatin (LIPITOR) 80 MG tablet Take 80 mg by mouth every evening.   01/27/2017 at Unknown time  . carvedilol (COREG) 3.125 MG tablet Take 3.125 mg by mouth 2 (two) times daily with a meal.   01/14/2017 at Unknown time  . docusate sodium (COLACE) 100 MG capsule Take 100 mg by mouth daily.   01/20/2017 at Unknown time  . gabapentin (NEURONTIN) 100 MG capsule Take 1 capsule (100 mg total) by mouth 2 (two) times daily.   01/15/2017 at Unknown time  . guaiFENesin (ROBITUSSIN) 100 MG/5ML SOLN Take 5 mLs every 8 (eight) hours as needed by mouth for cough or to loosen phlegm.   unknown  . ipratropium-albuterol (DUONEB) 0.5-2.5 (3) MG/3ML SOLN Take 3 mLs every 6 (six) hours as needed by nebulization (shortness of breath).   unknown  . metolazone (ZAROXOLYN) 5 MG tablet Take 1 tablet (5 mg total) every other day by mouth.   unknown  . nitroGLYCERIN (NITROSTAT) 0.4 MG SL tablet Place 0.4 mg under the tongue every 5 (five) minutes as needed for chest pain.   unknown  . rOPINIRole (REQUIP) 1 MG tablet Take 1 mg by mouth at bedtime.   01/27/2017 at Unknown time  . senna (SENOKOT) 8.6 MG TABS tablet Take 1 tablet by mouth at bedtime.   01/27/2017 at Unknown time  . tiotropium (SPIRIVA) 18 MCG inhalation capsule Place 18 mcg into inhaler and inhale daily.   01/24/2017 at Unknown time  . torsemide (DEMADEX) 100 MG  tablet Take 1  tablet (100 mg total) daily by mouth.   01/11/2017 at Unknown time   Scheduled: . apixaban  5 mg Oral BID  . aspirin EC  81 mg Oral Daily  . atorvastatin  80 mg Oral QPM  . chlorhexidine gluconate (MEDLINE KIT)  15 mL Mouth Rinse BID  . Chlorhexidine Gluconate Cloth  6 each Topical Daily  . gabapentin  100 mg Oral BID  . insulin aspart  0-20 Units Subcutaneous Q4H  . ipratropium-albuterol  3 mL Nebulization Q6H  . mouth rinse  15 mL Mouth Rinse BID  . pantoprazole (PROTONIX) IV  40 mg Intravenous Daily  . potassium chloride  40 mEq Oral Daily  . senna  1 tablet Oral QHS  . sodium chloride flush  10-40 mL Intracatheter Q12H  . sodium chloride flush  3 mL Intravenous Q12H   Continuous: . sodium chloride 250 mL (01/29/17 1709)  . amiodarone    . amiodarone    . fentaNYL infusion INTRAVENOUS 200 mcg/hr (01/15/2017 0530)  . norepinephrine (LEVOPHED) Adult infusion 28 mcg/min (02/04/2017 0530)  . potassium chloride     XYI:AXKPVV chloride, acetaminophen, fentaNYL (SUBLIMAZE) injection, fentaNYL (SUBLIMAZE) injection, midazolam, midazolam, ondansetron (ZOFRAN) IV, sodium chloride flush, sodium chloride flush  Assesment: He was admitted with acute on chronic hypoxic and hypercapnic respiratory failure related to acute on chronic combined systolic and diastolic heart failure.  He had apparently been noncompliant with CPAP and apparently with some medications at the skilled care facility.  Yesterday he had been doing well able to have conversations when he suddenly had cardiopulmonary arrest.  He did respond to CPR and resuscitation and now is intubated and on mechanical ventilation.  He has been hypotensive and now is on norepinephrine at 32 mcg.  He is not received Lasix because of his blood pressure but he may need to get some to improve his respiratory status.  He is still on 100% oxygen.  He has been having cardiac arrhythmias and is on oral amiodarone and I have taken the liberty of  changing that to IV.  He has a marginal potassium and I have taken the liberty of ordering potassium replacement.  I have also requested magnesium level since his magnesium was low earlier particularly in the face of cardiac arrhythmias. 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  Continue ventilator support.    LOS: 2 days   Welby Montminy L 02/08/2017, 7:42 AM

## 2017-01-30 NOTE — Progress Notes (Signed)
Moderate Sedation  Conscious sedation for cardioversion.  The anesthesia plan was to use moderate sedation / analgesia (conscious sedation). Immediately prior to administration of medications, the patient was re-assessed for adequacy to receive sedatives. The heart rate, respiratory rate, oxygen saturations, blood pressure, adequacy of pulmonary ventilation, and response to care were monitored throughout the procedure. The physical status of the patient was re-assessed after the procedure.  Used 2 mg versed, 100 mcg fentanyl and 20 mg etomidate.  Patient woke up post procedure.  Rush Farmer, M.D. [facility] Pulmonary/Critical Care Medicine. Pager: [phone]. After hours pager: [phone].

## 2017-01-30 NOTE — Progress Notes (Signed)
eLink Physician-Brief Progress Note Patient Name: Darrell Black DOB: [birth year] MRN: [id number]   Date of Service  01/12/2017  HPI/Events of Note  hypotensive on levo @ 40  eICU Interventions  RPt ABG & co-ox to ensure no acidosis & to see if inotropes indicated     Intervention Category Major Interventions: Hypotension - evaluation and management  Romulo Okray V. Antonisha Waskey 01/29/2017, 8:00 PM

## 2017-01-30 NOTE — Progress Notes (Signed)
Patient with VT arrest this afternoon. Received just a few minutes of CPR, self terminated did not require defibrillation. His ICD did not deliver therapy, it is unclear the malfunction, we have contacted the rep for interrrogation. We have rebolused amiodarone 150mg , remains out of VT. He has been receiving K and Mg this morning. Levophed weaning, starting neosynephrine for less arrhythmia drive. Given his CAD history, cardiomyopathy, and electrical and hemodynamic instability/cardiogenic shock (mixed venous O2 per verbal report 35%) he requires a cath. His he has AKI on CKD, despite his renal dysfunction given his significant electrical and hemodynamic instability I would recommend going for RHC/LHC. Will need to have his ICD evaluted at [facility]. He is being transferred to ICU service at [facility], we will ask for RHC/LHC. Awaiting confirmation of mixed venous O2, would not start inotrope at this time given runs of VT.     Carlyle Dolly MD

## 2017-01-30 NOTE — Progress Notes (Addendum)
[facility] PROGRESS NOTE  Darrell Black GBT:[id number] DOB: 1955-06-17 DOA: 01/20/2017 PCP: Caprice Renshaw, MD  Interim summary and HPI 61 y.o. male with medical history significant of hx of afib, CAD,hx CABG/ subsequent stents,combined s/d CHF, HL, DM, HTN, ICM, depression, morbid obesity, OSA , AICD in place and CKD who was recently admitted from 11/5-14 for heart failure (EF 35-40%) and acute on chronic respiratory failure with hypercapnia.  He is back with respiratory distress. Found to have acute on chronic CHF, resp failure with hypercapnia/hypoxia and vascular congestion with pulmonary edema.  Despite significant improvement after multiple hours of BiPAP since admission, around 1:15pm patient developed acute resp arrest and PEA; that require resuscitation and mechanical intubation. Patient has an ICD in place . His defibrillator did not fire during his cardiac arrhythmias. Cardiology and pulmonary following .  Assessment/Plan: 1-acute on chronic hypercapnic and hypoxic respiratory failure: Now requiring mechanical ventilation on 100% fio2 -Multifactorial, including acute on chronic combined diastolic and systolic heart failure, obesity hypoventilation syndrome and obstructive sleep apnea. -Patient with vascular congestion and pulmonary edema on chest x-ray; also with elevated BNP and fluid overload on physical exam. -Appreciate assistance from Dr. Luan Pulling (pulmonologist) in management of his ventilatory support. -Will hold Lasix for now (given low blood pressure); resume again as tolerated once BP improved. Currently on vasopressors due to low BP  -No fever or signs of infiltrates on his x-ray.  Will hold on antibiotics at this moment. -will place picc line and once available assess CVP Cardiology consulted , they have ordered serial troponins, VBG , may need inotropic support  -holding b-blocker currently given low BP and decompensated CHF -unable to use ACE or ARB given renal  failure -patient in critical condition.  2-A. Fib -continue amiodarone iv  -continue eliquis -rate is stable and well controlled -CHADSVASC score 3-4 Replete magnesium and K  Interrogate ICD   3-shock : currently hypotensive -will hold antihypertensive regimen    CVP  Monitoring  - on levophed maintain MAP > 65  4-HLD -continue lipitor  5-hx of CAD: -continue statins and ASA -no CP -troponin neg pre cardiac arrest  -EKG w/o acute ischemic changes Repeat echo pending   6-acute on chronic renal failure: stage 3 at baseline  -renal service consulted -patient might ended requiring HD, will transfer to [facility] , has a hx of requiring  renal replacement therapy in 2016 -may need CRRT   7-GERD/Stress ulcer prophylaxis  -continue PPI  8-morbid obesity -Body mass index is 52.49 kg/m. -low calorie diet discussed with patient   Code Status: Full Family Communication: no family at bedside  Disposition Plan: Remains inpatient and in the ICU.  Continue ventilatory support; continue pressors and follow clinical response. Transfer to [facility] , discussed with son in law about the transfer    Consultants:  Pulmonary service  Renal service  cardiology  Procedures:  Endotracheal intubation with ventilatory support.  Antibiotics:  None   HPI/Subjective: Patient remains unresponsive on the ventilator Moving all 4 extremities spontaneously Currently on 100% FiO2 Hypotensive with systolic in the 71G  Objective: Vitals:   01/31/2017 0915 01/16/2017 0930  BP: 100/70 (!) 82/38  Pulse: 88 (!) 135  Resp:    Temp: 99.3 F (37.4 C) 99.2 F (37.3 C)  SpO2: 97% 95%    Intake/Output Summary (Last 24 hours) at 02/06/2017 0951 Last data filed at 01/17/2017 0941 Gross per 24 hour  Intake 1585.16 ml  Output 1750 ml  Net -164.84 ml   Danley Danker  Weights   02/06/2017 1854 01/29/17 0402 01/20/2017 0424  Weight: (!) 156.3 kg (344 lb 9.3 oz) (!) 156.9 kg (345 lb 14.4 oz) (!) 156.6 kg (345  lb 3.9 oz)    Exam:   General: No fever, sedated, intubated and receiving ventilatory support.  Glascow score 3  Cardiovascular: Rate control, no rubs, no gallops, no murmurs  Respiratory: Decreased breath sounds at the bases, positive fine crackles, no wheezing.  Abdomen: Obese, soft, nontender, nondistended, positive bowel sounds.  Musculoskeletal: 3++ edema bilaterally, open wound from stasis dermatitis in his right leg (w/o signs of infection or suppuration); no cyanosis.   Data Reviewed: Basic Metabolic Panel: Recent Labs  Lab 01/27/2017 1256 01/29/17 0454 01/29/17 1342 01/29/17 2115 01/29/2017 0533 02/03/2017 0740  NA 133* 135 135 135 135  --   K 3.7 3.4* 3.3* 3.7 3.5  --   CL 77* 78* 77* 78* 78*  --   CO2 45* 44* 43* 44* 46*  --   GLUCOSE 103* 106* 188* 136* 126*  --   BUN 90* 93* 94* 96* 97*  --   CREATININE 3.46* 3.49* 3.78* 3.65* 3.46*  --   CALCIUM 8.0* 8.0* 7.8* 8.0* 8.2*  --   MG  --   --  1.7 1.6*  --  1.6*   Liver Function Tests: Recent Labs  Lab 01/11/2017 1256  AST 22  ALT 18  ALKPHOS 132*  BILITOT 1.4*  PROT 6.4*  ALBUMIN 3.5   CBC: Recent Labs  Lab 02/04/2017 1256 01/29/17 0454 01/29/17 1342  WBC 6.3 5.6 9.3  NEUTROABS 5.1 4.5  --   HGB 9.7* 10.1* 9.9*  HCT 33.4* 35.4* 34.7*  MCV 101.2* 102.9* 103.3*  PLT 149* 134* 172   Cardiac Enzymes: Recent Labs  Lab 01/20/2017 1316 01/29/17 1342  TROPONINI <0.03 <0.03   BNP (last 3 results) Recent Labs    12/11/16 2330 01/14/17 1436 01/11/2017 1316  BNP 583.0* 687.0* 674.0*   CBG: Recent Labs  Lab 01/29/17 1627 01/29/17 1942 01/29/17 2350 02/07/2017 0337 01/19/2017 0740  GLUCAP 126* 123* 142* 144* 126*   Studies: Dg Chest Port 1 View  Result Date: 01/10/2017 CLINICAL DATA:  Ventilator patient. Atrial fibrillation, diabetes, hypertension, nonsmoker, enteric tube placement EXAM: PORTABLE CHEST 1 VIEW COMPARISON:  01/29/2017 FINDINGS: Postoperative changes in the mediastinum. Cardiac  pacemaker. Endotracheal tube with tip measuring 3.5 cm above the carina. Enteric tube is only segmentally visualized due to technique but probably extends to the left upper quadrant. Cardiac enlargement with mild vascular congestion. Shallow inspiration with atelectasis in the lung bases. Probable small bilateral pleural effusions. No pneumothorax. IMPRESSION: Appliances appear in satisfactory location although the enteric tube is incompletely visualized. Cardiac enlargement with pulmonary vascular congestion and bilateral pleural effusions. Atelectasis in the lung bases. Electronically Signed   By: Lucienne Capers M.D.   On: 01/24/2017 05:29   Dg Chest Port 1 View  Result Date: 01/29/2017 CLINICAL DATA:  Status post intubation. EXAM: PORTABLE CHEST 1 VIEW COMPARISON:  Radiograph of November 28, 2016. FINDINGS: Stable cardiomegaly with central pulmonary vascular congestion is noted. Endotracheal tube is seen projected over tracheal air shadow with distal tip 2 cm above the carina. Nasogastric tube is seen entering stomach. Left-sided pacemaker is unchanged in position. Increased bilateral lung opacities are noted concerning for worsening pulmonary edema. Minimal bilateral pleural effusions are noted. Bony thorax is unremarkable. IMPRESSION: Endotracheal tube in grossly good position. Stable cardiomegaly with central pulmonary vascular congestion and bilateral pulmonary edema. Minimal bilateral  pleural effusions are noted. Electronically Signed   By: Marijo Conception, M.D.   On: 01/29/2017 14:08   Dg Chest Port 1 View  Result Date: 01/20/2017 CLINICAL DATA:  Respiratory distress. History of atrial fibrillation, CHF, coronary artery disease, nonsmoker. EXAM: PORTABLE CHEST 1 VIEW COMPARISON:  Portable chest x-ray of January 17, 2017 FINDINGS: The lungs remain hypoinflated which is accentuated by the lordotic positioning. The interstitial markings remain increased. The pulmonary vascularity remains engorged  and the cardiac silhouette enlarged. The ICD is in stable position. There is a probable small right pleural effusion. IMPRESSION: CHF with pulmonary interstitial edema. There has been slight deterioration since the previous study. Electronically Signed   By: David  Martinique M.D.   On: 01/20/2017 14:11    Scheduled Meds: . apixaban  5 mg Oral BID  . aspirin EC  81 mg Oral Daily  . atorvastatin  80 mg Oral QPM  . chlorhexidine gluconate (MEDLINE KIT)  15 mL Mouth Rinse BID  . Chlorhexidine Gluconate Cloth  6 each Topical Daily  . gabapentin  100 mg Oral BID  . insulin aspart  0-20 Units Subcutaneous Q4H  . ipratropium-albuterol  3 mL Nebulization Q6H  . mouth rinse  15 mL Mouth Rinse BID  . pantoprazole (PROTONIX) IV  40 mg Intravenous Daily  . potassium chloride  40 mEq Oral Daily  . senna  1 tablet Oral QHS  . sodium chloride flush  10-40 mL Intracatheter Q12H  . sodium chloride flush  3 mL Intravenous Q12H   Continuous Infusions: . sodium chloride 250 mL (01/14/2017 0900)  . amiodarone 60 mg/hr (01/12/2017 0800)  . amiodarone    . fentaNYL infusion INTRAVENOUS 150 mcg/hr (01/24/2017 0800)  . norepinephrine (LEVOPHED) Adult infusion 30 mcg/min (01/31/2017 0913)  . potassium chloride 10 mEq (02/05/2017 0854)    Time spent: 105 minutes total; with 65-70 minutes critical care provided, running resuscitation code/stabilizing patient, coordinating care and arranging further consultations.   Reyne Dumas  [facility] Pager [phone]. If 7PM-7AM, please contact night-coverage at [email or web address], password [facility] 01/23/2017, 9:51 AM  LOS: 2 days

## 2017-01-30 NOTE — Consult Note (Signed)
Darrell Black MRN: [id number] DOB/AGE: 61-Oct-1957 61 y.o. Primary Care Physician:[name], [name], MD Admit date: 02/01/2017 Chief Complaint:  Chief Complaint  Patient presents with  . Respiratory Distress   HPI:  Pt is  a 61 year old caucasian male  With past medical hx of congestive heart failure who was sent to ER with c/o Respiratory distress.  HPI dates back to November 14 when he was discharged to his skilled care facility after being admitted for CHF and being diuresed for 15-18 liters. Pt later starting to  develop increasing shortness of breath.  Pt was found to be in resp distress and somnolent yesterday .EMS was called and pt was sent to the emergency department. Pt was found to be in hypercapnia/hypoxic respiratory failure and placed on   10 L and later he was placed on BiPAP. Upon further evaluation pt Chest x-ray showed recurrent heart failure. Pt was admitted for further care.  Pt then had acute resp arresst and PEA. Code blue was called. Pt was intubated and resuscitated. Pt seen today in ICU-Pt remains intubated and unable to offer any complaints      Past Medical History:  Diagnosis Date  . Atherosclerosis   . Atrial fibrillation (Darrell Black)   . 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 Chest pain   . Chronic combined systolic and diastolic heart failure (Darrell Black)    a. 12/2016: echo showing EF of 35-40% , diffuse HK, mild TR, and PA pressure of 38 mm Hg  . Chronic pain   . Coronary artery disease    a. s/p CABG in 2001  . 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 Hyperlipidemia   . Hypertension    essential  . Ischemic cardiomyopathy   . Major depressive disorder, recurrent (Darrell Black)   . Morbid obesity due to excess calories (Darrell Black)   . Muscle weakness (generalized)   . Neuropathy   . Obstructive sleep apnea   . Presence of automatic cardioverter/defibrillator (AICD)   . Renal disorder    Chronic kidney disease  . Restless leg  syndrome   . Sick-euthyroid syndrome   . Required renal replacement therapy in 2016     Family History  Problem Relation Age of Onset  . Hypertension Father   . Cancer Maternal Grandmother   . Alcohol abuse Maternal Grandfather     Social History:  reports that  has never smoked. he has never used smokeless tobacco. He reports that he does not drink alcohol or use drugs.   Allergies:  Allergies  Allergen Reactions  . Fish Allergy   . Ampicillin Rash  . Clindamycin/Lincomycin Rash  . Penicillins Rash    Has patient had a PCN reaction causing immediate rash, facial/tongue/throat swelling, SOB or lightheadedness with hypotension: Yes Has patient had a PCN reaction causing severe rash involving mucus membranes or skin necrosis: No Has patient had a PCN reaction that required hospitalization: No Has patient had a PCN reaction occurring within the last 10 years: No If all of the above answers are "NO", then may proceed with Cephalosporin use.     Medications Prior to Admission  Medication Sig Dispense Refill  . acetaminophen (TYLENOL) 325 MG tablet Take 650 mg by mouth every 6 (six) hours as needed.    [name] amiodarone (PACERONE) 200 MG tablet Take 200 mg by mouth daily.    [name] apixaban (ELIQUIS) 5 MG TABS tablet Take 1 tablet (5 mg total) by mouth 2 (two)  times daily. 60 tablet   . atorvastatin (LIPITOR) 80 MG tablet Take 80 mg by mouth every evening.    . carvedilol (COREG) 3.125 MG tablet Take 3.125 mg by mouth 2 (two) times daily with a meal.    . docusate sodium (COLACE) 100 MG capsule Take 100 mg by mouth daily.    [name] gabapentin (NEURONTIN) 100 MG capsule Take 1 capsule (100 mg total) by mouth 2 (two) times daily.    [name] guaiFENesin (ROBITUSSIN) 100 MG/5ML SOLN Take 5 mLs every 8 (eight) hours as needed by mouth for cough or to loosen phlegm.    [name] ipratropium-albuterol (DUONEB) 0.5-2.5 (3) MG/3ML SOLN Take 3 mLs every 6 (six) hours as needed by nebulization (shortness of breath).     . metolazone (ZAROXOLYN) 5 MG tablet Take 1 tablet (5 mg total) every other day by mouth.    . nitroGLYCERIN (NITROSTAT) 0.4 MG SL tablet Place 0.4 mg under the tongue every 5 (five) minutes as needed for chest pain.    [name] rOPINIRole (REQUIP) 1 MG tablet Take 1 mg by mouth at bedtime.    . senna (SENOKOT) 8.6 MG TABS tablet Take 1 tablet by mouth at bedtime.    [name] tiotropium (SPIRIVA) 18 MCG inhalation capsule Place 18 mcg into inhaler and inhale daily.    [name] torsemide (DEMADEX) 100 MG tablet Take 1 tablet (100 mg total) daily by mouth.         KCL:EXNTZ from the symptoms mentioned above,there are no other symptoms referable to all systems reviewed.  [name] apixaban  5 mg Oral BID  . aspirin EC  81 mg Oral Daily  . atorvastatin  80 mg Oral QPM  . chlorhexidine gluconate (MEDLINE KIT)  15 mL Mouth Rinse BID  . Chlorhexidine Gluconate Cloth  6 each Topical Daily  . gabapentin  100 mg Oral BID  . insulin aspart  0-20 Units Subcutaneous Q4H  . ipratropium-albuterol  3 mL Nebulization Q6H  . mouth rinse  15 mL Mouth Rinse BID  . pantoprazole (PROTONIX) IV  40 mg Intravenous Daily  . potassium chloride  40 mEq Oral Daily  . senna  1 tablet Oral QHS  . sodium chloride flush  10-40 mL Intracatheter Q12H  . sodium chloride flush  3 mL Intravenous Q12H      Physical Exam: Vital signs in last 24 hours: Temp:  [97.2 F (36.2 C)-100.1 F (37.8 C)] 99.8 F (37.7 C) (11/21 0600) Pulse Rate:  [68-115] 110 (11/21 0600) Resp:  [0-28] 20 (11/21 0600) BP: (57-155)/(23-116) 99/78 (11/21 0600) SpO2:  [87 %-100 %] 96 % (11/21 0826) FiO2 (%):  [99 %-100 %] 100 % (11/21 0828) Weight:  [345 lb 3.9 oz (156.6 kg)] 345 lb 3.9 oz (156.6 kg) (11/21 0424) Weight change: 13 lb 3.9 oz (6.006 kg) Last BM Date: 01/27/17  Intake/Output from previous day: 11/20 0701 - 11/21 0700 In: 1200.1 [P.O.:600; I.V.:550.1; IV Piggyback:50] Out: 0017 [Urine:1750] No intake/output data recorded.   Physical  Exam: General- pt is sedated HEENT-ET tube in situ Resp- No acute REsp distress,  Rhonchi+ CVS- S1S2 irregular in rate and rhythm GIT- BS+, soft, NT, ND EXT- 1+ LE Edema, NO Cyanosis     Lab Results: CBC Recent Labs    01/29/17 0454 01/29/17 1342  WBC 5.6 9.3  HGB 10.1* 9.9*  HCT 35.4* 34.7*  PLT 134* 172    BMET Recent Labs    01/29/17 2115 02/08/2017 0533  NA 135 135  K 3.7  3.5  CL 78* 78*  CO2 44* 46*  GLUCOSE 136* 126*  BUN 96* 97*  CREATININE 3.65* 3.46*  CALCIUM 8.0* 8.2*   Creat trend 2018  2.1=> 3.65=>3.46 In Early November admission 1.6==> 2.3=>2.1=> 2.0 In October admission 1.3--3.3     MICRO No results found for this or any previous visit (from the past 240 hour(s)).    Lab Results  Component Value Date   CALCIUM 8.2 (L) 02/08/2017   PHOS 3.2 01/23/2017      Impression: 1)Renal  AKI secondary to  ATN               AKI sec to  Hypotension( Code blue)                                  Cardiorenal                AKI on CKD               CKD stage 3/4.               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                Nephrolithiasis Hx Absent   2)CVS- admitted after code blue          On vasopressors          On Amiodarone           Hx of CHF          Pt currently diuresing          500 ml out in past 5 hours          Negative by approx 2 liters since admission     3)Anemia HGb at goal (9--11)   4)CKD Mineral-Bone Disorder  Phosphorus at goal. Calcium when corrected for low albumin is at goal.  5)CHF-admitted with CHF Pt on diuretics Primary MD following  6)Electrolytes  Normokalemic NOrmonatremic   7)Acid base Admitted with acute resp acidosis + Chronic  resp acidosis . pH 7.30=> 7.4  Pt later had code blue- had Lactic acidosis PCO2 now better        Plan:  Agree with cuirrent tx and plan. Pt currently  Diuresing. Pt creat at plateau. In case urine output drops, will consider diuretics/ dial;ysis depending upon the clinical needs.       Salimatou Simone S 01/17/2017, 9:07 AM

## 2017-01-30 NOTE — Progress Notes (Signed)
CRITICAL VALUE ALERT  Critical Value: CO2 62.7 Date & Time Notied:02/06/2017 0545  Provider Notified:Lama  Orders Received/Actions taken: no

## 2017-01-30 NOTE — Progress Notes (Signed)
ANTICOAGULATION CONSULT NOTE - Initial Consult  Pharmacy Consult for heparin Indication: atrial fibrillation  Allergies  Allergen Reactions  . Fish Allergy   . Ampicillin Rash  . Clindamycin/Lincomycin Rash  . Penicillins Rash    Has patient had a PCN reaction causing immediate rash, facial/tongue/throat swelling, SOB or lightheadedness with hypotension: Yes Has patient had a PCN reaction causing severe rash involving mucus membranes or skin necrosis: No Has patient had a PCN reaction that required hospitalization: No Has patient had a PCN reaction occurring within the last 10 years: No If all of the above answers are "NO", then may proceed with Cephalosporin use.     Patient Measurements: Height: 5\' 8"  (172.7 cm) Weight: (!) 345 lb 3.9 oz (156.6 kg) IBW/kg (Calculated) : 68.4 Heparin Dosing Weight: 108kg  Vital Signs: Temp: 98.7 F (37.1 C) (11/21 1152) BP: 118/73 (11/21 1152) Pulse Rate: 86 (11/21 1152)  Labs: Recent Labs    01/12/2017 1256 02/01/2017 1316 01/29/17 0454 01/29/17 1342 01/29/17 2115 01/13/2017 0533 01/27/2017 0742 02/04/2017 1134  HGB 9.7*  --  10.1* 9.9*  --   --   --   --   HCT 33.4*  --  35.4* 34.7*  --   --   --   --   PLT 149*  --  134* 172  --   --   --   --   LABPROT  --   --   --   --  25.5* 25.2*  --   --   INR  --   --   --   --  2.35 2.31  --   --   CREATININE 3.46*  --  3.49* 3.78* 3.65* 3.46*  --  3.56*  TROPONINI  --  <0.03  --  <0.03  --   --  0.15*  --     Estimated Creatinine Clearance: 32 mL/min (A) (by C-G formula based on SCr of 3.56 mg/dL (H)).   Assessment: 43 YOM s/p PEA arrest on 11/20 and VFib arrest today- transferred to [facility]. On Eliquis PTA for AFib- was resumed at [facility] with last dose given 11/20 at ~1000. Now to transition to IV heparin.  Hgb 9.9, plts 172- no bleeding noted.  Goal of Therapy:  Heparin level 0.3-0.7 units/ml Monitor platelets by anticoagulation protocol: Yes   Plan:  Heparin bolus with 3000 units  IV x1, then start infusion at 1500units/hr Heparin level in 8h d/t renal function Daily heparin level and CBC  Jenifer Struve D. Maelie Chriswell, PharmD, BCPS Clinical Pharmacist [phone] 01/25/2017 3:39 PM

## 2017-01-30 NOTE — Procedures (Signed)
Arterial Catheter Insertion Procedure Note Darrell Black [id number] 1955/09/02  Procedure: Insertion of Arterial Catheter  Indications: Blood pressure monitoring and Frequent blood sampling  Procedure Details Consent: Risks of procedure as well as the alternatives and risks of each were explained to the (patient/caregiver).  Consent for procedure obtained. Time Out: Verified patient identification, verified procedure, site/side was marked, verified correct patient position, special equipment/implants available, medications/allergies/relevent history reviewed, required imaging and test results available.  Performed  Maximum sterile technique was used including antiseptics, cap, gloves, gown, hand hygiene, mask and sheet. Skin prep: Chlorhexidine; local anesthetic administered 20 gauge catheter was inserted into left radial artery using the Seldinger technique.  Evaluation Blood flow good; BP tracing good. Complications: No apparent complications.   Darrell Black 01/17/2017

## 2017-01-30 NOTE — Progress Notes (Signed)
RT note-RR decreased post ABG. No other changes.

## 2017-01-30 NOTE — Consult Note (Signed)
error 

## 2017-01-30 NOTE — Procedures (Signed)
Electrical Cardioversion Procedure Note Lovelace Cerveny [id number] [birth year]  Procedure: Electrical Cardioversion Indications:  Atrial Flutter.  Emergent consent with aflutter/RVR and cardiogenic shock.  No family could be contacted.  Patient awake on vent and can follow commands.  Procedure explained to him and he nodded to give consent.    Procedure Details Consent: Risks of procedure as well as the alternatives and risks of each were explained to the (patient/caregiver).  Consent for procedure obtained. => emergent consent, see above.  Time Out: Verified patient identification, verified procedure, site/side was marked, verified correct patient position, special equipment/implants available, medications/allergies/relevent history reviewed, required imaging and test results available.  Performed  Patient placed on cardiac monitor, pulse oximetry, supplemental oxygen as necessary.  Sedation given: Etomidate, Versed, Fentanyl per CCM Pacer pads placed anterior and posterior chest.  Cardioverted 1 time(s).  Cardioverted at [place].  Evaluation Findings: Post procedure EKG shows: NSR Complications: None Patient did tolerate procedure well.   Loralie Champagne 01/17/2017, 5:06 PM

## 2017-01-30 NOTE — Consult Note (Signed)
PULMONARY / CRITICAL CARE MEDICINE   Name: Darrell Black MRN: [id number] DOB: 1955/12/31    ADMISSION DATE:  01/10/2017 CONSULTATION DATE: 01/14/2017  REFERRING MD:  Allyson Sabal  CHIEF COMPLAINT: Cardiac arrest  HISTORY OF PRESENT ILLNESS:  Pt is encephelopathic; therefore, this HPI is obtained from chart review. Darrell Black is a 61 y.o. male with PMH as outlined below.  He was admitted to [facility] 11/19 with AMS, minimally responsive state, bradypnea.  He was found to have acute hypercapnia with hypoxia.  He was also found to have pulmonary edema with presumed CHF exacerbation.  He was started on BiPAP as well as Lasix drip.  On 11/20, despite being on BiPAP for multiple hours, he developed PEA and respiratory arrest.  He had been seen by palliative care prior to this and stated that he wished for full code status.  It is unclear of how long his resuscitation lasted.  Postarrest post arrest, he had hypotension and was subsequently started on Levophed. Overnight, he had some cardiac arrhythmias and on 11/21, had VT.  He was evaluated by cardiology who did not feel that this represented an acute ischemic event, but rather related to electrolyte abnormalities.  11/21, he was transferred to [facility] for further management.  Prior to transfer, had recurrent VT with a few minutes of CPR.  AICD did not fire, unclear of reason.  Device rep being contacted for interrogation.  Amio rebolused.  Cards feels he needs cardiac cath.   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  PAST SURGICAL HISTORY: He  has a past surgical history that includes Colonoscopy (N/A, 12/20/2016).  Allergies  Allergen Reactions  . Fish Allergy   . Ampicillin Rash  . Clindamycin/Lincomycin Rash  . Penicillins Rash    Has patient had a PCN reaction causing immediate rash, facial/tongue/throat swelling, SOB or lightheadedness with hypotension: Yes Has patient had a PCN reaction causing severe rash involving mucus membranes or skin necrosis: No Has patient had a PCN reaction that required hospitalization: No Has patient had a PCN reaction occurring within the last 10 years: No If all of the above answers are "NO", then may proceed with Cephalosporin use.     No current facility-administered medications on file prior to encounter.    Current Outpatient Medications on File Prior to Encounter  Medication Sig  . acetaminophen (TYLENOL) 325 MG tablet Take 650 mg by mouth every 6 (six) hours as needed.  [name] amiodarone (PACERONE) 200 MG tablet Take 200 mg by mouth daily.  [name] apixaban (ELIQUIS) 5 MG TABS tablet Take 1 tablet (5 mg total) by mouth 2 (two) times daily.  [name] atorvastatin (LIPITOR) 80 MG tablet Take 80 mg by mouth every evening.  . carvedilol (COREG) 3.125 MG tablet Take 3.125 mg by mouth 2 (two) times daily with a meal.  . docusate sodium (COLACE) 100 MG capsule Take 100 mg by mouth daily.  [name] gabapentin (NEURONTIN) 100 MG capsule Take 1 capsule (100 mg total) by mouth 2 (two) times daily.  [name] guaiFENesin (ROBITUSSIN) 100 MG/5ML SOLN Take 5  mLs every 8 (eight) hours as needed by mouth for cough or to loosen phlegm.  [name] ipratropium-albuterol (DUONEB) 0.5-2.5 (3) MG/3ML SOLN Take 3 mLs every 6 (six) hours as needed by nebulization (shortness of breath).  . metolazone (ZAROXOLYN) 5 MG tablet Take 1 tablet (5 mg  total) every other day by mouth.  . nitroGLYCERIN (NITROSTAT) 0.4 MG SL tablet Place 0.4 mg under the tongue every 5 (five) minutes as needed for chest pain.  [name] rOPINIRole (REQUIP) 1 MG tablet Take 1 mg by mouth at bedtime.  . senna (SENOKOT) 8.6 MG TABS tablet Take 1 tablet by mouth at bedtime.  [name] tiotropium (SPIRIVA) 18 MCG inhalation capsule Place 18 mcg into inhaler and inhale daily.  [name] torsemide (DEMADEX) 100 MG tablet Take 1 tablet (100 mg total) daily by mouth.    FAMILY HISTORY:  His indicated that his mother is deceased. He indicated that his father is deceased. He indicated that his brother is alive. He indicated that his maternal grandmother is deceased. He indicated that his maternal grandfather is deceased. He indicated that his paternal grandmother is deceased. He indicated that his paternal grandfather is deceased.   SOCIAL HISTORY: He  reports that  has never smoked. he has never used smokeless tobacco. He reports that he does not drink alcohol or use drugs.  REVIEW OF SYSTEMS:   Unable to obtain as pt is encephalopathic.  SUBJECTIVE:  On vent, unresponsive.  VITAL SIGNS: BP 120/61   Pulse (!) 113   Temp 99 F (37.2 C)   Resp 13   Ht 5\' 8"  (1.727 m)   Wt (!) 156.6 kg (345 lb 3.9 oz)   SpO2 96%   BMI 52.49 kg/m   HEMODYNAMICS:    VENTILATOR SETTINGS: Vent Mode: PRVC FiO2 (%):  [99 %-100 %] 100 % Set Rate:  [16 bmp] 16 bmp Vt Set:  [550 mL] 550 mL PEEP:  [5 cmH20] 5 cmH20 Plateau Pressure:  [24 cmH20-30 cmH20] 30 cmH20  INTAKE / OUTPUT: I/O last 3 completed shifts: In: 1200.1 [P.O.:600; I.V.:550.1; IV Piggyback:50] Out: 2150 [Urine:2150]   PHYSICAL EXAMINATION: General: Adult male, obese, resting in bed, critically ill. Neuro: Sedated, non-responsive. HEENT: [place]/AT. PERRL, sclerae anicteric. Cardiovascular: RRR, no M/R/G.  Lungs: Respirations even and unlabored.  BS diminished but clear. Abdomen: Obese, BS x 4, soft, NT/ND.  Musculoskeletal: No  gross deformities, no edema.  Skin: Intact, warm, no rashes.   LABS:  BMET Recent Labs  Lab 01/29/17 1342 01/29/17 2115 01/17/2017 0533  NA 135 135 135  K 3.3* 3.7 3.5  CL 77* 78* 78*  CO2 43* 44* 46*  BUN 94* 96* 97*  CREATININE 3.78* 3.65* 3.46*  GLUCOSE 188* 136* 126*    Electrolytes Recent Labs  Lab 01/29/17 1342 01/29/17 2115 01/23/2017 0533 01/23/2017 0740  CALCIUM 7.8* 8.0* 8.2*  --   MG 1.7 1.6*  --  1.6*    CBC Recent Labs  Lab 02/08/2017 1256 01/29/17 0454 01/29/17 1342  WBC 6.3 5.6 9.3  HGB 9.7* 10.1* 9.9*  HCT 33.4* 35.4* 34.7*  PLT 149* 134* 172    Coag's Recent Labs  Lab 01/29/17 2115 01/20/2017 0533  INR 2.35 2.31    Sepsis Markers Recent Labs  Lab 01/23/2017 1306 01/29/17 1711 01/17/2017 0740  LATICACIDVEN 1.18 2.0* 1.1    ABG Recent Labs  Lab 01/29/17 1400 01/29/17 1540 01/21/2017 0435  PHART 7.309* 7.425 7.493*  PCO2ART 83.6* 69.8* 62.7*  PO2ART 75.1* 104.00 77.6*  Liver Enzymes Recent Labs  Lab 02/01/2017 1256  AST 22  ALT 18  ALKPHOS 132*  BILITOT 1.4*  ALBUMIN 3.5    Cardiac Enzymes Recent Labs  Lab 01/14/2017 1316 01/29/17 1342 01/23/2017 0742  TROPONINI <0.03 <0.03 0.15*    Glucose Recent Labs  Lab 01/29/17 1121 01/29/17 1627 01/29/17 1942 01/29/17 2350 02/01/2017 0337 01/13/2017 0740  GLUCAP 139* 126* 123* 142* 144* 126*    Imaging Dg Chest Port 1 View  Result Date: 01/24/2017 CLINICAL DATA:  Ventilator patient. Atrial fibrillation, diabetes, hypertension, nonsmoker, enteric tube placement EXAM: PORTABLE CHEST 1 VIEW COMPARISON:  01/29/2017 FINDINGS: Postoperative changes in the mediastinum. Cardiac pacemaker. Endotracheal tube with tip measuring 3.5 cm above the carina. Enteric tube is only segmentally visualized due to technique but probably extends to the left upper quadrant. Cardiac enlargement with mild vascular congestion. Shallow inspiration with atelectasis in the lung bases. Probable small bilateral  pleural effusions. No pneumothorax. IMPRESSION: Appliances appear in satisfactory location although the enteric tube is incompletely visualized. Cardiac enlargement with pulmonary vascular congestion and bilateral pleural effusions. Atelectasis in the lung bases. Electronically Signed   By: Lucienne Capers M.D.   On: 01/11/2017 05:29   Dg Chest Port 1 View  Result Date: 01/29/2017 CLINICAL DATA:  Status post intubation. EXAM: PORTABLE CHEST 1 VIEW COMPARISON:  Radiograph of November 28, 2016. FINDINGS: Stable cardiomegaly with central pulmonary vascular congestion is noted. Endotracheal tube is seen projected over tracheal air shadow with distal tip 2 cm above the carina. Nasogastric tube is seen entering stomach. Left-sided pacemaker is unchanged in position. Increased bilateral lung opacities are noted concerning for worsening pulmonary edema. Minimal bilateral pleural effusions are noted. Bony thorax is unremarkable. IMPRESSION: Endotracheal tube in grossly good position. Stable cardiomegaly with central pulmonary vascular congestion and bilateral pulmonary edema. Minimal bilateral pleural effusions are noted. Electronically Signed   By: Marijo Conception, M.D.   On: 01/29/2017 14:08     STUDIES:  CXR 11/21 > vascular congestion, small effusions. Echo 11/21 > Cath 11/21 >   CULTURES: None.  ANTIBIOTICS: None.  SIGNIFICANT EVENTS: 11/19 > admit. 11/20 > PEA arrest, intubated, started on levophed 11/21 > episode of VT felt to be due to electrolyte abnormalities, transferred to [facility]  LINES/TUBES: ETT 11/20 >  R PICC 11/20 >  L radial a line 11/21 >   DISCUSSION: 61 y.o. male with hx sCHF (EF 35-50% on echo Oct 2018) and chronic hypercapnic respiratory failure, admitted 11/19 with AoC hypercapnia and hypoxia, pulmonary edema.  11/20 he suffered PEA arrest and was subsequently intubated.  11/21 he was transferred to [facility] for further management.  ASSESSMENT /  PLAN:  PULMONARY A: Respiratory insufficiency - s/p intubation after PEA arrest. Hx chronic hypercapnic and hypoxic respiratory failure. Hx OSA. P:   Full vent support. Wean as able. VAP prevention measures. Hold SBT until hemodynamics improved. CXR in AM. Nocturnal CPAP once extubated.  CARDIOVASCULAR A:  PEA arrest - presumed due to hypoxia.  Not a candidate for hypothermia as PEA occurred 1 day ago. Cardiogenic shock - co-ox noted to be 35. Troponin bump - due to above. VT - felt to be due to electrolyte abnormalities. 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 P:  Cardiology notified of transfer. Cardiology consult advanced heart failure team. Switch from levo to neo given VT recurrence. Continue amiodarone. Likely needs inotropic support, held so far due to  recurrent VT.  Will defer to cardiology on timing of starting this. F/u troponins, CVP, co-ox, echo. Ensure electrolytes corrected, maintain K > 4 and Mg > 2. Heparin gtt in lieu of preadmission apixaban. Continue preadmission ASA, atorvastatin. Hold preadmission carvedilol, metolazone, torsemide.  RENAL A:   AoCKD. P:   Might need renal replacement therapy. Holding nephrology consult for now. BMP q8hrs - maintain K > 4, Mg > 2.  GASTROINTESTINAL A:   GI prophylaxis. Nutrition. P:   SUP: Pantoprazole. NPO.  HEMATOLOGIC A:   Anemia - chronic. VTE Prophylaxis. Chronic anticoagulation - apixaban. P:  Transfuse for Hgb < 7. SCD's / heparin gtt. CBC in AM.  INFECTIOUS A:   No indication of infection. P:   Monitor clinically.  ENDOCRINE A:   Hx DM (med controlled), sick-euthyroid. P:   SSI.  NEUROLOGIC A:   Sedation due to mechanical ventilation. P:   Sedation:  Fentanyl gtt / Midazolam PRN. RASS goal: 0 to -1. Daily WUA. Hold preadmission gabapentin, ropinirole.  Family updated: Niece, Donia Guiles called over the phone  but no answer (she had been the person that nursing had been in communication with).  Interdisciplinary Family Meeting v Palliative Care Meeting:  Due by: 02/05/17.  CC time: 40 min.  Montey Hora, [facility] Pager: [phone]  or [phone] 01/26/2017, 3:03 PM  Attending Note:  61 year old male with extensive cardiac history who had 2 cardiac arrests in [facility] who was transferred to [facility] for critical care to manage.  Patient had a cardiac arrest on 11/20 and another on 11/21 and is now in cardiogenic shock.  On exam, diffuse crackles.  CXR that I reviewed myself with pulmonary edema and ETT is in good position.  Discussed with PCCM-NP and Cards-MD.  Will adjust vent for ABG.  Continue circulatory support until cardiology finalize plan.  Begin TF per nutrition.  Cards to interrogate pacer.  Will need diureses once shock and renal function improve.  Making urine so will hold off calling renal for now.  PCCM will admit.  The patient is critically ill with multiple organ systems failure and requires high complexity decision making for assessment and support, frequent evaluation and titration of therapies, application of advanced monitoring technologies and extensive interpretation of multiple databases.   Critical Care Time devoted to patient care services described in this note is  35  Minutes. This time reflects time of care of this signee Dr [name]. This critical care time does not reflect procedure time, or teaching time or supervisory time of PA/NP/Med student/Med Resident etc but could involve care discussion time.  Rush Farmer, M.D. [facility] Pulmonary/Critical Care Medicine. Pager: [phone]. After hours pager: [phone].

## 2017-01-30 NOTE — Progress Notes (Signed)
  Echocardiogram 2D Echocardiogram with Definity has been performed.  Tresa Res 01/22/2017, 5:31 PM

## 2017-01-31 ENCOUNTER — Inpatient Hospital Stay (HOSPITAL_COMMUNITY): Payer: Medicaid Other

## 2017-01-31 LAB — BASIC METABOLIC PANEL
Anion gap: 13 (ref 5–15)
Anion gap: 11 (ref 5–15)
Anion gap: 13 (ref 5–15)
BUN: 93 mg/dL — AB (ref 6–20)
BUN: 89 mg/dL — AB (ref 6–20)
BUN: 90 mg/dL — AB (ref 6–20)
CALCIUM: 7.2 mg/dL — AB (ref 8.9–10.3)
CALCIUM: 7.2 mg/dL — AB (ref 8.9–10.3)
CALCIUM: 7.3 mg/dL — AB (ref 8.9–10.3)
CHLORIDE: 81 mmol/L — AB (ref 101–111)
CHLORIDE: 82 mmol/L — AB (ref 101–111)
CO2: 40 mmol/L — ABNORMAL HIGH (ref 22–32)
CO2: 41 mmol/L — ABNORMAL HIGH (ref 22–32)
CO2: 40 mmol/L — ABNORMAL HIGH (ref 22–32)
CREATININE: 3.45 mg/dL — AB (ref 0.61–1.24)
CREATININE: 3.4 mg/dL — AB (ref 0.61–1.24)
CREATININE: 3.34 mg/dL — AB (ref 0.61–1.24)
Chloride: 83 mmol/L — ABNORMAL LOW (ref 101–111)
GFR calc non Af Amer: 18 mL/min — ABNORMAL LOW (ref >60)
GFR, EST AFRICAN AMERICAN: 21 mL/min — AB (ref >60)
GFR, EST AFRICAN AMERICAN: 21 mL/min — AB (ref >60)
GFR, EST AFRICAN AMERICAN: 21 mL/min — AB (ref >60)
GFR, EST NON AFRICAN AMERICAN: 18 mL/min — AB (ref >60)
GFR, EST NON AFRICAN AMERICAN: 18 mL/min — AB (ref >60)
Glucose, Bld: 150 mg/dL — ABNORMAL HIGH (ref 65–99)
Glucose, Bld: 151 mg/dL — ABNORMAL HIGH (ref 65–99)
Glucose, Bld: 146 mg/dL — ABNORMAL HIGH (ref 65–99)
Potassium: 4 mmol/L (ref 3.5–5.1)
Potassium: 3.7 mmol/L (ref 3.5–5.1)
Potassium: 3.7 mmol/L (ref 3.5–5.1)
SODIUM: 134 mmol/L — AB (ref 135–145)
SODIUM: 135 mmol/L (ref 135–145)
SODIUM: 135 mmol/L (ref 135–145)

## 2017-01-31 LAB — CBC
HCT: 24.1 % — ABNORMAL LOW (ref 39.0–52.0)
Hemoglobin: 7.6 g/dL — ABNORMAL LOW (ref 13.0–17.0)
MCH: 29.6 pg (ref 26.0–34.0)
MCHC: 31.5 g/dL (ref 30.0–36.0)
MCV: 93.8 fL (ref 78.0–100.0)
PLATELETS: 193 10*3/uL (ref 150–400)
RBC: 2.57 MIL/uL — AB (ref 4.22–5.81)
RDW: 17.2 % — AB (ref 11.5–15.5)
WBC: 16 10*3/uL — AB (ref 4.0–10.5)

## 2017-01-31 LAB — COMPREHENSIVE METABOLIC PANEL
ALT: 16 U/L — AB (ref 17–63)
AST: 23 U/L (ref 15–41)
Albumin: 2.4 g/dL — ABNORMAL LOW (ref 3.5–5.0)
Alkaline Phosphatase: 106 U/L (ref 38–126)
Anion gap: 13 (ref 5–15)
BILIRUBIN TOTAL: 3.6 mg/dL — AB (ref 0.3–1.2)
BUN: 93 mg/dL — AB (ref 6–20)
CALCIUM: 7.4 mg/dL — AB (ref 8.9–10.3)
CO2: 42 mmol/L — ABNORMAL HIGH (ref 22–32)
CREATININE: 3.57 mg/dL — AB (ref 0.61–1.24)
Chloride: 80 mmol/L — ABNORMAL LOW (ref 101–111)
GFR calc non Af Amer: 17 mL/min — ABNORMAL LOW (ref >60)
GFR, EST AFRICAN AMERICAN: 20 mL/min — AB (ref >60)
Glucose, Bld: 145 mg/dL — ABNORMAL HIGH (ref 65–99)
Potassium: 3.6 mmol/L (ref 3.5–5.1)
Sodium: 135 mmol/L (ref 135–145)
TOTAL PROTEIN: 5 g/dL — AB (ref 6.5–8.1)

## 2017-01-31 LAB — STREP PNEUMONIAE URINARY ANTIGEN: Strep Pneumo Urinary Antigen: NEGATIVE

## 2017-01-31 LAB — APTT
APTT: 130 s — AB (ref 24–36)
aPTT: 157 seconds — ABNORMAL HIGH (ref 24–36)
aPTT: 81 seconds — ABNORMAL HIGH (ref 24–36)

## 2017-01-31 LAB — GLUCOSE, CAPILLARY
GLUCOSE-CAPILLARY: 144 mg/dL — AB (ref 65–99)
GLUCOSE-CAPILLARY: 143 mg/dL — AB (ref 65–99)
GLUCOSE-CAPILLARY: 143 mg/dL — AB (ref 65–99)
Glucose-Capillary: 147 mg/dL — ABNORMAL HIGH (ref 65–99)

## 2017-01-31 LAB — BLOOD GAS, ARTERIAL
Acid-Base Excess: 19.7 mmol/L — ABNORMAL HIGH (ref 0.0–2.0)
BICARBONATE: 44.9 mmol/L — AB (ref 20.0–28.0)
Drawn by: 39897
FIO2: 70
O2 Saturation: 94.7 %
PATIENT TEMPERATURE: 98.6
PEEP/CPAP: 10 cmH2O
PO2 ART: 71.9 mmHg — AB (ref 83.0–108.0)
PRESSURE CONTROL: 22 cmH2O
RATE: 20 resp/min
pCO2 arterial: 58.5 mmHg — ABNORMAL HIGH (ref 32.0–48.0)
pH, Arterial: 7.497 — ABNORMAL HIGH (ref 7.350–7.450)

## 2017-01-31 LAB — PHOSPHORUS: PHOSPHORUS: 3.7 mg/dL (ref 2.5–4.6)

## 2017-01-31 LAB — HEPARIN LEVEL (UNFRACTIONATED): Heparin Unfractionated: >2.2 IU/mL — ABNORMAL HIGH (ref 0.30–0.70)

## 2017-01-31 LAB — PREPARE RBC (CROSSMATCH)

## 2017-01-31 LAB — COOXEMETRY PANEL
Carboxyhemoglobin: 2 % — ABNORMAL HIGH (ref 0.5–1.5)
Methemoglobin: 0.9 % (ref 0.0–1.5)
O2 SAT: 79.9 %
TOTAL HEMOGLOBIN: 7.5 g/dL — AB (ref 12.0–16.0)

## 2017-01-31 LAB — MAGNESIUM: MAGNESIUM: 2 mg/dL (ref 1.7–2.4)

## 2017-01-31 LAB — PROCALCITONIN: PROCALCITONIN: 0.61 ng/mL

## 2017-01-31 MED ORDER — DEXTROSE 5 % IV SOLN
1.0000 g | Freq: Two times a day (BID) | INTRAVENOUS | Status: DC
  Administered 2017-01-31 – 2017-02-02 (×5): 1 g via INTRAVENOUS
  Filled 2017-01-31 (×6): qty 1

## 2017-01-31 MED ORDER — SODIUM CHLORIDE 0.9 % IV SOLN
0.0000 ug/min | INTRAVENOUS | Status: DC
  Administered 2017-01-31: 360 ug/min via INTRAVENOUS
  Administered 2017-01-31: 330 ug/min via INTRAVENOUS
  Administered 2017-01-31: 270 ug/min via INTRAVENOUS
  Administered 2017-01-31: 340 ug/min via INTRAVENOUS
  Administered 2017-02-01: 230 ug/min via INTRAVENOUS
  Administered 2017-02-01: 240 ug/min via INTRAVENOUS
  Administered 2017-02-01: 200 ug/min via INTRAVENOUS
  Administered 2017-02-01: 220 ug/min via INTRAVENOUS
  Administered 2017-02-02 (×2): 360 ug/min via INTRAVENOUS
  Administered 2017-02-02: 370 ug/min via INTRAVENOUS
  Filled 2017-01-31 (×2): qty 8
  Filled 2017-01-31: qty 5
  Filled 2017-01-31 (×9): qty 8
  Filled 2017-01-31: qty 5
  Filled 2017-01-31: qty 8

## 2017-01-31 MED ORDER — SODIUM CHLORIDE 0.9 % IV SOLN
Freq: Once | INTRAVENOUS | Status: AC
  Administered 2017-01-31: 12:00:00 via INTRAVENOUS

## 2017-01-31 MED ORDER — VANCOMYCIN HCL 10 G IV SOLR
1750.0000 mg | INTRAVENOUS | Status: DC
  Administered 2017-02-01 – 2017-02-02 (×2): 1750 mg via INTRAVENOUS
  Filled 2017-01-31 (×2): qty 1750

## 2017-01-31 MED ORDER — ROPINIROLE HCL 1 MG PO TABS
1.0000 mg | ORAL_TABLET | Freq: Once | ORAL | Status: AC
  Administered 2017-01-31: 1 mg
  Filled 2017-01-31: qty 1

## 2017-01-31 MED ORDER — VASOPRESSIN 20 UNIT/ML IV SOLN
0.0300 [IU]/min | INTRAVENOUS | Status: DC
  Administered 2017-01-31 – 2017-02-01 (×3): 0.03 [IU]/min via INTRAVENOUS
  Filled 2017-01-31 (×5): qty 2

## 2017-01-31 MED ORDER — POTASSIUM CHLORIDE 10 MEQ/50ML IV SOLN
10.0000 meq | INTRAVENOUS | Status: AC
  Administered 2017-01-31 (×4): 10 meq via INTRAVENOUS
  Filled 2017-01-31 (×4): qty 50

## 2017-01-31 MED ORDER — VANCOMYCIN HCL 10 G IV SOLR
2500.0000 mg | Freq: Once | INTRAVENOUS | Status: AC
  Administered 2017-01-31: 2500 mg via INTRAVENOUS
  Filled 2017-01-31: qty 2500

## 2017-01-31 NOTE — Progress Notes (Signed)
Dr. Dewaine Oats on the floor. Heard of pt on 310 of Neo and maxed on Levo at 40. Orders given to start vasopressin. Will continue to monitor.

## 2017-01-31 NOTE — Consult Note (Signed)
PULMONARY / CRITICAL CARE MEDICINE   Name: Darrell Black MRN: [id number] DOB: 20-Apr-1955    ADMISSION DATE:  01/21/2017 CONSULTATION DATE: 01/31/2017  REFERRING MD:  Allyson Sabal  CHIEF COMPLAINT: Cardiac arrest  HISTORY OF PRESENT ILLNESS:  Pt is encephelopathic; therefore, this HPI is obtained from chart review. Darrell Black is a 61 y.o. male with PMH as outlined below.  He was admitted to [facility] 11/19 with AMS, minimally responsive state, bradypnea.  He was found to have acute hypercapnia with hypoxia.  He was also found to have pulmonary edema with presumed CHF exacerbation.  He was started on BiPAP as well as Lasix drip.  On 11/20, despite being on BiPAP for multiple hours, he developed PEA and respiratory arrest.  He had been seen by palliative care prior to this and stated that he wished for full code status.  It is unclear of how long his resuscitation lasted.  Postarrest post arrest, he had hypotension and was subsequently started on Levophed. Overnight, he had some cardiac arrhythmias and on 11/21, had VT.  He was evaluated by cardiology who did not feel that this represented an acute ischemic event, but rather related to electrolyte abnormalities.  11/21, he was transferred to [facility] for further management.  Prior to transfer, had recurrent VT with a few minutes of CPR.  AICD did not fire, unclear of reason.  Device rep being contacted for interrogation.  Amio rebolused.  Cards feels he needs cardiac cath.  01/25/2017 cardiology decline to catheter due to worsening renal function and hypotensive state requiring 3 pressor support.   SUBJECTIVE:  On full mechanical ventilatory support, awake and alert.  Currently on 3 pressors.  VITAL SIGNS: BP (!) 85/40   Pulse 77   Temp 99.5 F (37.5 C)   Resp (!) 25   Ht 5\' 8"  (1.727 m)   Wt (!) 420 lb (190.5 kg)   SpO2 93%   BMI 63.86 kg/m   HEMODYNAMICS: CVP:  [10 mmHg-19 mmHg] 13 mmHg  VENTILATOR SETTINGS: Vent Mode: PCV FiO2 (%):   [70 %-100 %] 70 % Set Rate:  [20 bmp-24 bmp] 20 bmp Vt Set:  [550 mL] 550 mL PEEP:  [10 cmH20-12 cmH20] 10 cmH20 Plateau Pressure:  [19 cmH20-35 cmH20] 20 cmH20  INTAKE / OUTPUT: I/O last 3 completed shifts: In: 5550.5 [I.V.:5150.5; IV Piggyback:400] Out: 2951 [Urine:3425]   PHYSICAL EXAMINATION: General: Morbidly obese male awake alert in no acute distress at rest HEENT: Short neck positive JVD pupils equal reactive light PSY: Normal affect Neuro: Follows commands, moves all extremities x4 appears intact CV: Heart sounds are regular ventricular rate is 76, no murmurs appreciated PULM: Decreased air movement, bibasilar crackles noted, FiO2 is currently at 70% weaning  OA:CZYS, non-tender, bsx4 active, Extremities: 2+ edema, lower extremities was mild venous stasis, right ankle has dressing Skin: no rashes or lesions    LABS:  BMET Recent Labs  Lab 02/06/2017 1547 01/27/2017 2228 01/31/17 0426  NA 135 135 135  K 3.5 3.7 3.6  CL 82* 80* 80*  CO2 40* 43* 42*  BUN 93* 93* 93*  CREATININE 3.57* 3.52* 3.57*  GLUCOSE 119* 133* 145*    Electrolytes Recent Labs  Lab 01/29/17 2115  01/18/2017 0740  01/14/2017 1547 01/31/2017 2228 01/31/17 0426  CALCIUM 8.0*   < >  --    < > 7.6* 7.6* 7.4*  MG 1.6*  --  1.6*  --   --   --  2.0  PHOS  --   --   --   --   --   --  3.7   < > = values in this interval not displayed.    CBC Recent Labs  Lab 01/29/17 0454 01/29/17 1342 01/31/17 0426  WBC 5.6 9.3 16.0*  HGB 10.1* 9.9* 7.6*  HCT 35.4* 34.7* 24.1*  PLT 134* 172 193    Coag's Recent Labs  Lab 01/29/17 2115 02/03/2017 0533 01/31/17 0030  APTT  --   --  157*  INR 2.35 2.31  --     Sepsis Markers Recent Labs  Lab 01/22/2017 1306 01/29/17 1711 01/26/2017 0740 01/29/2017 1655 01/31/17 0426  LATICACIDVEN 1.18 2.0* 1.1  --   --   PROCALCITON  --   --   --  0.42 0.61    ABG Recent Labs  Lab 02/08/2017 0435 01/17/2017 1609 02/04/2017 2012  PHART 7.493* 7.569* 7.509*   PCO2ART 62.7* 56.3* 65.3*  PO2ART 77.6* 73.0* 69.0*    Liver Enzymes Recent Labs  Lab 01/25/2017 1256 01/31/17 0426  AST 22 23  ALT 18 16*  ALKPHOS 132* 106  BILITOT 1.4* 3.6*  ALBUMIN 3.5 2.4*    Cardiac Enzymes Recent Labs  Lab 01/29/17 1342 01/14/2017 0742 01/26/2017 1547  TROPONINI <0.03 0.15* 0.20*    Glucose Recent Labs  Lab 01/11/2017 1119 02/04/2017 1522 02/01/2017 2012 01/25/2017 2324 01/31/17 0314 01/31/17 0758  GLUCAP 129* 119* 151* 115* 144* 143*    Imaging Dg Chest Port 1 View  Result Date: 01/31/2017 CLINICAL DATA:  61 year old male with acute on chronic respiratory failure EXAM: PORTABLE CHEST 1 VIEW COMPARISON:  Prior chest x-ray 01/29/2017 FINDINGS: Patient is intubated. The tip of the endotracheal tube is 3.9 cm above the carina. A right upper extremity PICC is present. The tip overlies the distal SVC. A gastric tube is present. The tip lies inferior to the diaphragm, presumably within the stomach. Stable cardiomegaly. Slightly improved pulmonary vascular congestion. Bilateral veil like opacities project over the lung bases likely reflecting a combination of layering pleural effusions and atelectasis. Patient is status post median sternotomy with evidence of prior CABG. Left subclavian approach cardiac rhythm maintenance device remains in unchanged position with leads projecting over the right atrium and right ventricle. No acute osseous abnormality. IMPRESSION: 1. Slightly improved pulmonary vascular congestion. 2. Otherwise, no significant interval change with stable cardiomegaly and small to moderate bilateral layering effusions with associated bibasilar atelectasis. 3. Stable and satisfactory support apparatus. Electronically Signed   By: Jacqulynn Cadet M.D.   On: 01/31/2017 09:18   Dg Chest Port 1 View  Result Date: 01/20/2017 CLINICAL DATA:  Hypoxia. EXAM: PORTABLE CHEST 1 VIEW COMPARISON:  01/10/2017 and 01/29/2017. FINDINGS: 1240 hour. Endotracheal tube  appears unchanged at the mid trachea. Left subclavian pacemaker leads in right upper arm PICC appear unchanged. Nasogastric tube is in place, tip not visualized. There is stable cardiomegaly status post median sternotomy. There is vascular congestion with probable mild edema, small bilateral pleural effusions and bibasilar atelectasis. No pneumothorax. IMPRESSION: Similar appearance of the chest with suspected congestive heart failure manifesting as pulmonary edema and bilateral pleural effusions. Endotracheal tube appears satisfactorily positioned. Tip of the enteric tube is not visualized. Electronically Signed   By: Richardean Sale M.D.   On: 02/07/2017 12:59     STUDIES:  CXR 11/21 > vascular congestion, small effusions. Echo 11/21 > EF 20-25%, mild left ventricular hypertrophy, mitral valve was severely calcified annulus, left atrium was dilated, cath 11/21 > canceled  CULTURES: 01/31/2017 blood cultures x2>> 01/31/2017 tracheal aspirate>>  ANTIBIOTICS: 11/22 fortaz>> 11/22 vanc>>  SIGNIFICANT EVENTS:  11/19 > admit. 11/20 > PEA arrest, intubated, started on levophed 11/21 > episode of VT felt to be due to electrolyte abnormalities, transferred to [facility].  LINES/TUBES: ETT 11/20 >  R PICC 11/20 >  L radial a line 11/21 >   DISCUSSION: 61 y.o. male with hx sCHF (EF 35-50% on echo Oct 2018) and chronic hypercapnic respiratory failure, admitted 11/19 with AoC hypercapnia and hypoxia, pulmonary edema.  11/20 he suffered PEA arrest and was subsequently intubated.  11/21 he was transferred to [facility] for further management.  Currently on 3 vasopressors, pulmonary wise is stable.  May need renal consult in [facility] for volume overload as he remains in a positive fluid balance despite Lasix drip.  ASSESSMENT / PLAN:  PULMONARY A: Respiratory insufficiency - s/p intubation after PEA arrest. Hx chronic hypercapnic and hypoxic respiratory failure. Hx OSA. P:   Full vent support. Wean as able. VAP  prevention measures. Hold SBT until hemodynamics improved. CXR daily Nocturnal CPAP once extubated.  CARDIOVASCULAR A:  PEA arrest - presumed due to hypoxia.  Not a candidate for hypothermia as PEA occurred 1 day ago. Cardiogenic shock - co-ox noted to be 35. Troponin bump - due to above. VT - felt to be due to electrolyte abnormalities. 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 P:  Cardiology notified of transfer and is following the patient daily Cardiology consult advanced heart failure team. Currently requiring 3 pressors Levophed, Neo-Synephrine, vasopressin Continue amiodarone per cardiology Likely needs inotropic support, held so far due to recurrent VT.  Will defer to cardiology on timing of starting this.  Suspect unit of blood will help with blood pressure. F/u troponins, CVP, co-ox, echo. Ensure electrolytes corrected, maintain K > 4 and Mg > 2. Heparin gtt in lieu of preadmission apixaban. Continue preadmission ASA, atorvastatin. Hold preadmission carvedilol, metolazone, torsemide.  RENAL Lab Results  Component Value Date   CREATININE 3.57 (H) 01/31/2017   CREATININE 3.52 (H) 01/16/2017   CREATININE 3.57 (H) 01/10/2017   Recent Labs  Lab 01/22/2017 1547 02/06/2017 2228 01/31/17 0426  K 3.5 3.7 3.6     Intake/Output Summary (Last 24 hours) at 01/31/2017 0957 Last data filed at 01/31/2017 0800 Gross per 24 hour  Intake 4571.76 ml  Output 2525 ml  Net 2046.76 ml     A:   AoCKD. P:   Might need renal replacement therapy. Consider nephrology consult BMP Currently on Lasix drip with positive I and O   GASTROINTESTINAL A:   GI prophylaxis. Nutrition. P:   SUP: Pantoprazole. NPO. Will need tube feedings  HEMATOLOGIC Recent Labs    01/29/17 1342 01/31/17 0426  HGB 9.9* 7.6*   A:   Anemia - chronic. VTE Prophylaxis. Chronic anticoagulation - apixaban. P:  Transfuse per  protocol and in the setting of coronary artery disease he is being transfused 1 unit of packed cells per cardiology SCD's / heparin gtt. close monitoring with decreased hemoglobin CBC daily  INFECTIOUS A:   Pro-calcitonin 0.61 White count is 16 Overnight T-max 99.5 P:   Blood cultures x2 Sputum culture Trend WBC, temperature curve, other indicators of infection 11/22 vanc and fortaz empirically   ENDOCRINE CBG (last 3)  Recent Labs    01/11/2017 2324 01/31/17 0314 01/31/17 0758  GLUCAP 115* 144* 143*    A:   Hx DM (med controlled), sick-euthyroid. P:   SSI.  NEUROLOGIC A:   Sedation due to  mechanical ventilation. P:   Sedation:  Fentanyl gtt / Midazolam PRN. RASS goal: 0 to -1. Daily WUA. Hold preadmission gabapentin, ropinirole. Currently on full mechanical ventilatory support and only required as needed sedation  Family updated: Niece, Donia Guiles called over the phone but no answer (she had been the person that nursing had been in communication with). 01/31/2017 no family at the bedside currently.  Interdisciplinary Family Meeting v Palliative Care Meeting:  Due by: 02/05/17.  CC time: 40 min.  Richardson Landry Alfreda Hammad ACNP Maryanna Shape PCCM Pager [phone] till 1 pm If no answer page [phone] 01/31/2017, 9:51 AM  [place]. Pager: [phone]. After hours pager: [phone].

## 2017-01-31 NOTE — Progress Notes (Signed)
RT note: Sputum sample obtained and sent down to main lab without complications.   

## 2017-01-31 NOTE — Progress Notes (Signed)
[facility] for heparin Indication: atrial fibrillation  Allergies  Allergen Reactions  . Fish Allergy   . Ampicillin Rash  . Clindamycin/Lincomycin Rash  . Penicillins Rash    Has patient had a PCN reaction causing immediate rash, facial/tongue/throat swelling, SOB or lightheadedness with hypotension: Yes Has patient had a PCN reaction causing severe rash involving mucus membranes or skin necrosis: No Has patient had a PCN reaction that required hospitalization: No Has patient had a PCN reaction occurring within the last 10 years: No If all of the above answers are "NO", then may proceed with Cephalosporin use.     Patient Measurements: Height: 5\' 8"  (172.7 cm) Weight: (!) 420 lb (190.5 kg) IBW/kg (Calculated) : 68.4 Heparin Dosing Weight: 108kg  Vital Signs: Temp: 99.5 F (37.5 C) (11/22 1845) Temp Source: Core (Comment) (11/22 1434) BP: 100/46 (11/22 1555) Pulse Rate: 73 (11/22 1845)  Labs: Recent Labs    01/29/17 0454 01/29/17 1342 01/29/17 2115 01/11/2017 0533 02/08/2017 0742  02/08/2017 1547 01/29/2017 2228 01/31/17 0030 01/31/17 0426 01/31/17 1053 01/31/17 1544 01/31/17 1846  HGB 10.1* 9.9*  --   --   --   --   --   --   --  7.6*  --   --   --   HCT 35.4* 34.7*  --   --   --   --   --   --   --  24.1*  --   --   --   PLT 134* 172  --   --   --   --   --   --   --  193  --   --   --   APTT  --   --   --   --   --   --   --   --  157*  --  130*  --  81*  LABPROT  --   --  25.5* 25.2*  --   --   --   --   --   --   --   --   --   INR  --   --  2.35 2.31  --   --   --   --   --   --   --   --   --   HEPARINUNFRC  --   --   --   --   --   --   --   --  >2.20*  --   --   --   --   CREATININE 3.49* 3.78* 3.65* 3.46*  --    < > 3.57* 3.52*  --  3.57*  --  3.45*  --   TROPONINI  --  <0.03  --   --  0.15*  --  0.20*  --   --   --   --   --   --    < > = values in this interval not displayed.    Estimated Creatinine Clearance: 37.3  mL/min (A) (by C-G formula based on SCr of 3.45 mg/dL (H)).   Assessment: 40 yoM s/p PEA arrest on 11/20 and VFib arrest 11/21 - transferred to [facility]. Pt on Eliquis PTA for AFib- was resumed at [facility] with last dose given 11/20 at ~1000. Now to transition to IV heparin.  APTT therapeutic at 81 after rate adjustment. Hgb down this morning, now s/p 1 units pRBC.  Goal of Therapy:  Heparin level  0.3-0.7 units/ml aPTT 66-102 seconds Monitor platelets by anticoagulation protocol: Yes   Plan:  -Continue heparin infusion at 1050 units/hr -Daily heparin level, aPTT, and CBC -Monitor for s/sx of bleeding   Thank you for allowing [place] to participate in this patients care.  Jens Som, PharmD If after 3:30p, please call main pharmacy at: [phone] 01/31/2017 7:19 PM

## 2017-01-31 NOTE — Progress Notes (Signed)
Pharmacy Antibiotic Note  Darrell Black is a 61 y.o. male admitted on 02/06/2017 s/p cardiac arrest with cardiogenic shock. WBC and PCT elevated so pharmacy has been consulted for vancomycin and ceftazidime dosing for possible septic shock. Pt has noted PCN allergy with rash, but appears to have tolerated cephalosporins in the past.  Plan: -Vancomycin 2500mg  IV x1 then 1750mg  IV q24h -Ceftazidime 1g IV q12h  Height: 5\' 8"  (172.7 cm) Weight: (!) 420 lb (190.5 kg) IBW/kg (Calculated) : 68.4  Temp (24hrs), Avg:99.2 F (37.3 C), Min:98.4 F (36.9 C), Max:99.7 F (37.6 C)  Recent Labs  Lab 01/17/2017 1256 01/13/2017 1306 01/29/17 0454 01/29/17 1342 01/29/17 1711  01/12/2017 0533 02/05/2017 0740 01/22/2017 1134 01/15/2017 1547 01/31/2017 2228 01/31/17 0426  WBC 6.3  --  5.6 9.3  --   --   --   --   --   --   --  16.0*  CREATININE 3.46*  --  3.49* 3.78*  --    < > 3.46*  --  3.56* 3.57* 3.52* 3.57*  LATICACIDVEN  --  1.18  --   --  2.0*  --   --  1.1  --   --   --   --    < > = values in this interval not displayed.    Estimated Creatinine Clearance: 36 mL/min (A) (by C-G formula based on SCr of 3.57 mg/dL (H)).    Allergies  Allergen Reactions  . Fish Allergy   . Ampicillin Rash  . Clindamycin/Lincomycin Rash  . Penicillins Rash    Has patient had a PCN reaction causing immediate rash, facial/tongue/throat swelling, SOB or lightheadedness with hypotension: Yes Has patient had a PCN reaction causing severe rash involving mucus membranes or skin necrosis: No Has patient had a PCN reaction that required hospitalization: No Has patient had a PCN reaction occurring within the last 10 years: No If all of the above answers are "NO", then may proceed with Cephalosporin use.     Antimicrobials this admission: 11/22 Vancomycin >>  11/22 Ceftazidime >>   Dose adjustments this admission: none  Microbiology results: 11/22 BCx: px 11/22 Sputum: px  Thank you for allowing pharmacy to be a  part of this patient's care.  Arrie Senate, PharmD, BCPS PGY-2 Cardiology Pharmacy Resident Pager: [phone] 01/31/2017

## 2017-01-31 NOTE — Progress Notes (Signed)
[facility] for heparin Indication: atrial fibrillation  Allergies  Allergen Reactions  . Fish Allergy   . Ampicillin Rash  . Clindamycin/Lincomycin Rash  . Penicillins Rash    Has patient had a PCN reaction causing immediate rash, facial/tongue/throat swelling, SOB or lightheadedness with hypotension: Yes Has patient had a PCN reaction causing severe rash involving mucus membranes or skin necrosis: No Has patient had a PCN reaction that required hospitalization: No Has patient had a PCN reaction occurring within the last 10 years: No If all of the above answers are "NO", then may proceed with Cephalosporin use.     Patient Measurements: Height: 5\' 8"  (172.7 cm) Weight: (!) 345 lb 3.9 oz (156.6 kg) IBW/kg (Calculated) : 68.4 Heparin Dosing Weight: 108kg  Vital Signs: Temp: 99.3 F (37.4 C) (11/22 0200) Temp Source: Core (Comment) (11/21 2000) BP: 119/44 (11/22 0200) Pulse Rate: 83 (11/22 0200)  Labs: Recent Labs    01/29/2017 1256  01/29/17 0454 01/29/17 1342 01/29/17 2115 02/03/2017 0533 02/06/2017 0742 01/10/2017 1134 01/22/2017 1547 01/21/2017 2228 01/31/17 0030  HGB 9.7*  --  10.1* 9.9*  --   --   --   --   --   --   --   HCT 33.4*  --  35.4* 34.7*  --   --   --   --   --   --   --   PLT 149*  --  134* 172  --   --   --   --   --   --   --   APTT  --   --   --   --   --   --   --   --   --   --  157*  LABPROT  --   --   --   --  25.5* 25.2*  --   --   --   --   --   INR  --   --   --   --  2.35 2.31  --   --   --   --   --   HEPARINUNFRC  --   --   --   --   --   --   --   --   --   --  >2.20*  CREATININE 3.46*  --  3.49* 3.78* 3.65* 3.46*  --  3.56* 3.57* 3.52*  --   TROPONINI  --    < >  --  <0.03  --   --  0.15*  --  0.20*  --   --    < > = values in this interval not displayed.    Estimated Creatinine Clearance: 32.3 mL/min (A) (by C-G formula based on SCr of 3.52 mg/dL (H)).   Assessment: 23 YOM s/p PEA arrest on 11/20 and  VFib arrest today- transferred to [facility] from [facility]. On Eliquis PTA for AFib- was resumed at [facility] with last dose given 11/20 at ~1000. Now to transition to IV heparin.  Heparin level is >2.2 and likely related to recent Eliquis dose, aPTT is also elevated at 156 sec. No bleeding noted, Hgb 9.9, platelets 172.  Goal of Therapy:  Heparin level 0.3-0.7 units/ml aPTT 66-102 seconds Monitor platelets by anticoagulation protocol: Yes   Plan:  Decrease heparin infusion to 1300 units/hr 8 hr aPTT  Daily heparin level, aPTT, and CBC Monitor for s/sx of bleeding   Renold Genta, PharmD, BCPS Clinical Pharmacist Main  pharmacy - [phone] 01/31/2017 2:33 AM

## 2017-01-31 NOTE — Progress Notes (Signed)
Patient ID: Darrell Black, male   DOB: Apr 06, 1955, 61 y.o.   MRN: [id number]     Advanced Heart Failure Rounding Note   Subjective:    Patient intubated, awake this morning and follows commands.    Pressor requirement increased overnight, he is now on norepinephrine 40, phenylephrine 340, and vasopressin 0.03.  Co-ox is good this morning at 79%.  Good UOP on Lasix gtt with CVP 17 this morning. Creatinine stable at 3.5.   PCT 0.42 => 0.61, Tm 99, WBCs 16.  Not on abx currently. CXR has shown pulmonary edema.   Hgb down to 7.6, no overt bleeding.   DCCV to NSR 11/21.  He remains in NSR on heparin gtt.   Echo: EF 20-25%, severe LV dilation, septal/apical AK.    Objective:   Weight Range: (!) 420 lb (190.5 kg) Body mass index is 63.86 kg/m.   Vital Signs:   Temp:  [98.4 F (36.9 C)-99.7 F (37.6 C)] 99.5 F (37.5 C) (11/22 0700) Pulse Rate:  [72-135] 76 (11/22 0700) Resp:  [0-25] 20 (11/22 0700) BP: (43-134)/(29-96) 121/29 (11/22 0700) SpO2:  [70 %-100 %] 94 % (11/22 0700) Arterial Line BP: (74-246)/(40-196) 100/46 (11/22 0700) FiO2 (%):  [80 %-100 %] 80 % (11/22 0334) Weight:  [420 lb (190.5 kg)] 420 lb (190.5 kg) (11/22 0500) Last BM Date: 01/27/17  Weight change: Filed Weights   01/29/17 0402 01/26/2017 0424 01/31/17 0500  Weight: (!) 345 lb 14.4 oz (156.9 kg) (!) 345 lb 3.9 oz (156.6 kg) (!) 420 lb (190.5 kg)    Intake/Output:   Intake/Output Summary (Last 24 hours) at 01/31/2017 0731 Last data filed at 01/31/2017 0600 Gross per 24 hour  Intake 4956.86 ml  Output 2325 ml  Net 2631.86 ml      Physical Exam    General:  Intubated, awake.  HEENT: Normal Neck: Thick. JVP 14+. Carotids 2+ bilat; no bruits. No lymphadenopathy or thyromegaly appreciated. Cor: PMI nonpalpable. Regular rate & rhythm. No rubs, gallops or murmurs. Lungs: Dependent crackles Abdomen: Soft, nontender, nondistended. No hepatosplenomegaly. No bruits or masses. Good bowel  sounds. Extremities: No cyanosis, clubbing, rash, edema Neuro: Alert, follows commands, cranial nerves grossly intact. moves all 4 extremities w/o difficulty.   Telemetry   NSR (personally reviewed)  Labs    CBC Recent Labs    01/19/2017 1256 01/29/17 0454 01/29/17 1342 01/31/17 0426  WBC 6.3 5.6 9.3 16.0*  NEUTROABS 5.1 4.5  --   --   HGB 9.7* 10.1* 9.9* 7.6*  HCT 33.4* 35.4* 34.7* 24.1*  MCV 101.2* 102.9* 103.3* 93.8  PLT 149* 134* 172 341   Basic Metabolic Panel Recent Labs    01/17/2017 0740  01/27/2017 2228 01/31/17 0426  NA  --    < > 135 135  K  --    < > 3.7 3.6  CL  --    < > 80* 80*  CO2  --    < > 43* 42*  GLUCOSE  --    < > 133* 145*  BUN  --    < > 93* 93*  CREATININE  --    < > 3.52* 3.57*  CALCIUM  --    < > 7.6* 7.4*  MG 1.6*  --   --  2.0  PHOS  --   --   --  3.7   < > = values in this interval not displayed.   Liver Function Tests Recent Labs    01/29/2017 1256 01/31/17  0426  AST 22 23  ALT 18 16*  ALKPHOS 132* 106  BILITOT 1.4* 3.6*  PROT 6.4* 5.0*  ALBUMIN 3.5 2.4*   No results for input(s): LIPASE, AMYLASE in the last 72 hours. Cardiac Enzymes Recent Labs    01/29/17 1342 01/23/2017 0742 01/29/2017 1547  TROPONINI <0.03 0.15* 0.20*    BNP: BNP (last 3 results) Recent Labs    12/11/16 2330 01/14/17 1436 01/23/2017 1316  BNP 583.0* 687.0* 674.0*    ProBNP (last 3 results) No results for input(s): PROBNP in the last 8760 hours.   D-Dimer No results for input(s): DDIMER in the last 72 hours. Hemoglobin A1C No results for input(s): HGBA1C in the last 72 hours. Fasting Lipid Panel No results for input(s): CHOL, HDL, LDLCALC, TRIG, CHOLHDL, LDLDIRECT in the last 72 hours. Thyroid Function Tests No results for input(s): TSH, T4TOTAL, T3FREE, THYROIDAB in the last 72 hours.  Invalid input(s): FREET3  Other results:   Imaging    Dg Chest Port 1 View  Result Date: 01/17/2017 CLINICAL DATA:  Hypoxia. EXAM: PORTABLE CHEST 1  VIEW COMPARISON:  01/16/2017 and 01/29/2017. FINDINGS: 1240 hour. Endotracheal tube appears unchanged at the mid trachea. Left subclavian pacemaker leads in right upper arm PICC appear unchanged. Nasogastric tube is in place, tip not visualized. There is stable cardiomegaly status post median sternotomy. There is vascular congestion with probable mild edema, small bilateral pleural effusions and bibasilar atelectasis. No pneumothorax. IMPRESSION: Similar appearance of the chest with suspected congestive heart failure manifesting as pulmonary edema and bilateral pleural effusions. Endotracheal tube appears satisfactorily positioned. Tip of the enteric tube is not visualized. Electronically Signed   By: Richardean Sale M.D.   On: 02/01/2017 12:59      Medications:     Scheduled Medications: . aspirin EC  81 mg Oral Daily  . atorvastatin  80 mg Per Tube QPM  . chlorhexidine gluconate (MEDLINE KIT)  15 mL Mouth Rinse BID  . Chlorhexidine Gluconate Cloth  6 each Topical Daily  . insulin aspart  0-20 Units Subcutaneous Q4H  . ipratropium-albuterol  3 mL Nebulization Q6H  . mouth rinse  15 mL Mouth Rinse 10 times per day  . pantoprazole (PROTONIX) IV  40 mg Intravenous Daily  . senna  1 tablet Oral QHS  . sodium chloride flush  10-40 mL Intracatheter Q12H  . sodium chloride flush  3 mL Intravenous Q12H     Infusions: . sodium chloride 250 mL (02/03/2017 2000)  . sodium chloride    . amiodarone 30 mg/hr (01/31/17 0311)  . fentaNYL infusion INTRAVENOUS    . furosemide (LASIX) infusion 12 mg/hr (01/29/2017 2000)  . heparin 1,300 Units/hr (01/31/17 0310)  . norepinephrine (LEVOPHED) Adult infusion 40 mcg/min (01/31/17 0054)  . phenylephrine (NEO-SYNEPHRINE) Adult infusion 340 mcg/min (01/31/17 0634)  . vasopressin (PITRESSIN) infusion - *FOR SHOCK* 0.03 Units/min (01/31/17 0412)     PRN Medications:  sodium chloride, acetaminophen, fentaNYL (SUBLIMAZE) injection, fentaNYL (SUBLIMAZE)  injection, midazolam, midazolam, ondansetron (ZOFRAN) IV, sodium chloride flush, sodium chloride flush    Patient Profile   Darrell Black is a 61 y.o. male with h/o CAD, s/p CABG 5621, Systolic CHF due to ICM, S/p St Jude ICD, Paroxysmal Afib/AFL, Chronic Eliquis, HTN, HLD, Depression, DM2, Morbid obesity, CKD stage III, and OSA non-compliant with CPAP. Moved recently from [place].  Pt admitted to [facility] 01/25/2017 with respiratory distress. PEA arrest 01/29/17. Transferred to [facility] 01/13/2017 with VT.   Assessment/Plan   1. Cardiogenic shock: St Jude  ICD.  EF 35-40% (ischemic cardiomyopathy) in 10/18.  Admitted to [facility] with respiratory distress/volume overload.  PEA arrest at [facility] 11/20, intubated.  Runs of VT at [facility].  Transferred to [facility].  Of note, he had been in atrial flutter, at times with RVR, since 7/18 => DCCV to NSR 7/21.  Echo (11/21) with EF 20-25%, septal and apical AK. Currently on norepinephrine 40, vasopressin 0.03, and phenylephrine 340 with Co-ox 79% and CVP 17.  I would question a component of septic/vasogenic shock with elevated WBCs, PCT elevated (though not markedly) and low grade fever in setting of wide pulse pressure.  - Continue pressure support to keep MAP > 60 or SBP > 95.   - On Lasix gtt at 12 mg/hr with good UOP.  Given elevated CVP 17 and ++fluid balance, would keep this going for now. Creatinine stably elevated.   - Unlikely to be candidate for advanced therapies with limited social support and AKI on CKD stage 3.  2. Atrial flutter: Persistent since 7/18, RVR at times.  He had missed only 2 doses Eliquis recently and has been started on heparin gtt (so off anticoagulation for < 24 hrs).  We decided that it would be reasonable to proceed with DCCV given cardiogenic shock in the absence of TEE.  He was sedated and successfully cardioverted to NSR on 11/21, he has been in NSR since that time.  - Continue amiodarone gtt.  - Continue heparin gtt for now.  3. CAD: s/p CABG 2001.   No chest pain prior to PEA arrest, TnI 0.15 => 0.2.  Worsening EF and shock, would ideally eventually have his coronaries evaluated.  However, with AKI and no STEMI, will need to wait until he clinically stabilizes.  - Continue ASA 81 and statin.  4. PEA arrest: Suspect respiratory driven, pulmonary edema.  5. VT: Patient has had runs of VT.  Rate around 135, has not triggered ICD therapies. No VT overnight.  - Now on amiodarone gtt.  6. ID: See discussion above. Temp 99, PCT somewhat elevated, WBCs 16, wide pulse pressure in absence of aortic insufficiency.  Suspect cardiogenic shock, but cannot rule out component of septic shock .  CXR with pulmonary edema.  - Send blood cultures.  - Question to CCM => empiric abx in this situation?  7. AKI on CKD stage 3: Suspect AKI is hemodynamically mediated due to shock.  Stable overnight with creatinine 3.5.  Good UOP.  8. Anemia: No overt blood loss but hemoglobin down to 7.6.  Will need to follow closely, he is on heparin gtt. Will give 1 unit PRBCs today.  9. Neuro: He is awake/alert on vent.   CRITICAL CARE Performed by: Loralie Champagne  Total critical care time: 40 minutes  Critical care time was exclusive of separately billable procedures and treating other patients.  Critical care was necessary to treat or prevent imminent or life-threatening deterioration.  Critical care was time spent personally by me on the following activities: development of treatment plan with patient and/or surrogate as well as nursing, discussions with consultants, evaluation of patient's response to treatment, examination of patient, obtaining history from patient or surrogate, ordering and performing treatments and interventions, ordering and review of laboratory studies, ordering and review of radiographic studies, pulse oximetry and re-evaluation of patient's condition.  Length of Stay: 3  Loralie Champagne, MD  01/31/2017, 7:31 AM  Advanced Heart Failure Team Pager  [phone] (M-F; 7a - 4p)  Please contact [facility] for night-coverage after  hours (4p -7a ) and weekends on [email or web address]

## 2017-01-31 NOTE — Progress Notes (Signed)
[place] for heparin Indication: atrial fibrillation  Allergies  Allergen Reactions  . Fish Allergy   . Ampicillin Rash  . Clindamycin/Lincomycin Rash  . Penicillins Rash    Has patient had a PCN reaction causing immediate rash, facial/tongue/throat swelling, SOB or lightheadedness with hypotension: Yes Has patient had a PCN reaction causing severe rash involving mucus membranes or skin necrosis: No Has patient had a PCN reaction that required hospitalization: No Has patient had a PCN reaction occurring within the last 10 years: No If all of the above answers are "NO", then may proceed with Cephalosporin use.     Patient Measurements: Height: 5\' 8"  (172.7 cm) Weight: (!) 420 lb (190.5 kg) IBW/kg (Calculated) : 68.4 Heparin Dosing Weight: 108kg  Vital Signs: Temp: 99.3 F (37.4 C) (11/22 1145) Temp Source: Core (11/22 1145) BP: 100/42 (11/22 1126) Pulse Rate: 75 (11/22 1145)  Labs: Recent Labs    01/29/17 0454 01/29/17 1342 01/29/17 2115 01/27/2017 0533 01/26/2017 0742  01/14/2017 1547 01/26/2017 2228 01/31/17 0030 01/31/17 0426 01/31/17 1053  HGB 10.1* 9.9*  --   --   --   --   --   --   --  7.6*  --   HCT 35.4* 34.7*  --   --   --   --   --   --   --  24.1*  --   PLT 134* 172  --   --   --   --   --   --   --  193  --   APTT  --   --   --   --   --   --   --   --  157*  --  130*  LABPROT  --   --  25.5* 25.2*  --   --   --   --   --   --   --   INR  --   --  2.35 2.31  --   --   --   --   --   --   --   HEPARINUNFRC  --   --   --   --   --   --   --   --  >2.20*  --   --   CREATININE 3.49* 3.78* 3.65* 3.46*  --    < > 3.57* 3.52*  --  3.57*  --   TROPONINI  --  <0.03  --   --  0.15*  --  0.20*  --   --   --   --    < > = values in this interval not displayed.    Estimated Creatinine Clearance: 36 mL/min (A) (by C-G formula based on SCr of 3.57 mg/dL (H)).   Assessment: 32 yoM s/p PEA arrest on 11/20 and VFib arrest 11/21 -  [facility]. Pt on Eliquis PTA for AFib- was resumed at [facility] with last dose given 11/20 at ~1000. Now to transition to IV heparin.  APTT remains elevated this morning but trending down after rate adjustment. Hgb down, 1 units pRBC ordered.  Goal of Therapy:  Heparin level 0.3-0.7 units/ml aPTT 66-102 seconds Monitor platelets by anticoagulation protocol: Yes   Plan:  -Decrease heparin infusion to 1050 units/hr -8 hr aPTT  -Daily heparin level, aPTT, and CBC -Monitor for s/sx of bleeding  Arrie Senate, PharmD, BCPS PGY-2 Cardiology Pharmacy Resident Pager: [phone] 01/31/2017

## 2017-02-01 ENCOUNTER — Inpatient Hospital Stay (HOSPITAL_COMMUNITY): Payer: Medicaid Other

## 2017-02-01 DIAGNOSIS — N179 Acute kidney failure, unspecified: Secondary | ICD-10-CM

## 2017-02-01 LAB — CBC WITH DIFFERENTIAL/PLATELET
Basophils Absolute: 0 10*3/uL (ref 0.0–0.1)
Basophils Relative: 0 %
EOS PCT: 0 %
Eosinophils Absolute: 0 10*3/uL (ref 0.0–0.7)
HEMATOCRIT: 22 % — AB (ref 39.0–52.0)
HEMOGLOBIN: 7.2 g/dL — AB (ref 13.0–17.0)
LYMPHS PCT: 9 %
Lymphs Abs: 1.5 10*3/uL (ref 0.7–4.0)
MCH: 29.9 pg (ref 26.0–34.0)
MCHC: 32.7 g/dL (ref 30.0–36.0)
MCV: 91.3 fL (ref 78.0–100.0)
MONOS PCT: 7 %
Monocytes Absolute: 1.2 10*3/uL — ABNORMAL HIGH (ref 0.1–1.0)
NEUTROS ABS: 13.8 10*3/uL — AB (ref 1.7–7.7)
Neutrophils Relative %: 84 %
Platelets: 166 10*3/uL (ref 150–400)
RBC: 2.41 MIL/uL — AB (ref 4.22–5.81)
RDW: 17.1 % — ABNORMAL HIGH (ref 11.5–15.5)
WBC: 16.5 10*3/uL — ABNORMAL HIGH (ref 4.0–10.5)

## 2017-02-01 LAB — CBC
HCT: 20.2 % — ABNORMAL LOW (ref 39.0–52.0)
Hemoglobin: 6.4 g/dL — CL (ref 13.0–17.0)
MCH: 29.4 pg (ref 26.0–34.0)
MCHC: 32.2 g/dL (ref 30.0–36.0)
MCV: 91.4 fL (ref 78.0–100.0)
PLATELETS: 170 10*3/uL (ref 150–400)
RBC: 2.21 MIL/uL — AB (ref 4.22–5.81)
RDW: 17.9 % — ABNORMAL HIGH (ref 11.5–15.5)
WBC: 18 10*3/uL — AB (ref 4.0–10.5)

## 2017-02-01 LAB — MAGNESIUM
MAGNESIUM: 1.7 mg/dL (ref 1.7–2.4)
Magnesium: 1.8 mg/dL (ref 1.7–2.4)

## 2017-02-01 LAB — GLUCOSE, CAPILLARY
GLUCOSE-CAPILLARY: 182 mg/dL — AB (ref 65–99)
GLUCOSE-CAPILLARY: 174 mg/dL — AB (ref 65–99)
Glucose-Capillary: 146 mg/dL — ABNORMAL HIGH (ref 65–99)
Glucose-Capillary: 147 mg/dL — ABNORMAL HIGH (ref 65–99)
Glucose-Capillary: 156 mg/dL — ABNORMAL HIGH (ref 65–99)
Glucose-Capillary: 166 mg/dL — ABNORMAL HIGH (ref 65–99)
Glucose-Capillary: 185 mg/dL — ABNORMAL HIGH (ref 65–99)

## 2017-02-01 LAB — PHOSPHORUS
PHOSPHORUS: 4.3 mg/dL (ref 2.5–4.6)
PHOSPHORUS: 4.3 mg/dL (ref 2.5–4.6)

## 2017-02-01 LAB — BLOOD GAS, ARTERIAL
Acid-Base Excess: 17.4 mmol/L — ABNORMAL HIGH (ref 0.0–2.0)
Bicarbonate: 42.9 mmol/L — ABNORMAL HIGH (ref 20.0–28.0)
FIO2: 70
O2 SAT: 97.4 %
PATIENT TEMPERATURE: 98.6
PCO2 ART: 65.3 mmHg — AB (ref 32.0–48.0)
PEEP: 10 cmH2O
PO2 ART: 90.6 mmHg (ref 83.0–108.0)
Pressure control: 22 cmH2O
RATE: 16 resp/min
pH, Arterial: 7.433 (ref 7.350–7.450)

## 2017-02-01 LAB — LEGIONELLA PNEUMOPHILA SEROGP 1 UR AG: L. PNEUMOPHILA SEROGP 1 UR AG: NEGATIVE

## 2017-02-01 LAB — BASIC METABOLIC PANEL
Anion gap: 16 — ABNORMAL HIGH (ref 5–15)
BUN: 90 mg/dL — ABNORMAL HIGH (ref 6–20)
CHLORIDE: 81 mmol/L — AB (ref 101–111)
CO2: 37 mmol/L — ABNORMAL HIGH (ref 22–32)
Calcium: 7.4 mg/dL — ABNORMAL LOW (ref 8.9–10.3)
Creatinine, Ser: 3.3 mg/dL — ABNORMAL HIGH (ref 0.61–1.24)
GFR, EST AFRICAN AMERICAN: 22 mL/min — AB (ref >60)
GFR, EST NON AFRICAN AMERICAN: 19 mL/min — AB (ref >60)
Glucose, Bld: 157 mg/dL — ABNORMAL HIGH (ref 65–99)
Potassium: 3.5 mmol/L (ref 3.5–5.1)
SODIUM: 134 mmol/L — AB (ref 135–145)

## 2017-02-01 LAB — COOXEMETRY PANEL
Carboxyhemoglobin: 1.5 % (ref 0.5–1.5)
METHEMOGLOBIN: 1.1 % (ref 0.0–1.5)
O2 SAT: 62.2 %
Total hemoglobin: 5.6 g/dL — CL (ref 12.0–16.0)

## 2017-02-01 LAB — APTT: APTT: 60 s — AB (ref 24–36)

## 2017-02-01 LAB — PREPARE RBC (CROSSMATCH)

## 2017-02-01 LAB — HEPARIN LEVEL (UNFRACTIONATED): Heparin Unfractionated: >2.2 IU/mL — ABNORMAL HIGH (ref 0.30–0.70)

## 2017-02-01 LAB — PROCALCITONIN: PROCALCITONIN: 0.96 ng/mL

## 2017-02-01 LAB — POTASSIUM: Potassium: 3.5 mmol/L (ref 3.5–5.1)

## 2017-02-01 MED ORDER — POTASSIUM CHLORIDE 10 MEQ/50ML IV SOLN
10.0000 meq | INTRAVENOUS | Status: AC
  Administered 2017-02-01 (×4): 10 meq via INTRAVENOUS
  Filled 2017-02-01 (×4): qty 50

## 2017-02-01 MED ORDER — MAGNESIUM SULFATE 2 GM/50ML IV SOLN
INTRAVENOUS | Status: AC
  Administered 2017-02-01: 2 g via INTRAVENOUS
  Filled 2017-02-01: qty 50

## 2017-02-01 MED ORDER — AMIODARONE LOAD VIA INFUSION
150.0000 mg | Freq: Once | INTRAVENOUS | Status: AC
  Administered 2017-02-01: 150 mg via INTRAVENOUS
  Filled 2017-02-01: qty 83.34

## 2017-02-01 MED ORDER — GABAPENTIN 100 MG PO CAPS: 100.0000 mg | ORAL_CAPSULE | Freq: Two times a day (BID) | ORAL | Status: DC

## 2017-02-01 MED ORDER — SODIUM CHLORIDE 0.9 % IV SOLN
Freq: Once | INTRAVENOUS | Status: AC
  Administered 2017-02-01: 05:00:00 via INTRAVENOUS

## 2017-02-01 MED ORDER — MAGNESIUM SULFATE 2 GM/50ML IV SOLN
2.0000 g | Freq: Once | INTRAVENOUS | Status: AC
  Administered 2017-02-01: 2 g via INTRAVENOUS

## 2017-02-01 MED ORDER — GABAPENTIN 250 MG/5ML PO SOLN
100.0000 mg | Freq: Two times a day (BID) | ORAL | Status: DC
  Administered 2017-02-01 – 2017-02-02 (×3): 100 mg
  Filled 2017-02-01 (×4): qty 2

## 2017-02-01 MED ORDER — PRO-STAT SUGAR FREE PO LIQD
60.0000 mL | Freq: Three times a day (TID) | ORAL | Status: DC
  Administered 2017-02-01 – 2017-02-02 (×4): 60 mL
  Filled 2017-02-01 (×5): qty 60

## 2017-02-01 MED ORDER — POTASSIUM CHLORIDE 10 MEQ/50ML IV SOLN
10.0000 meq | INTRAVENOUS | Status: AC
  Administered 2017-02-01 (×3): 10 meq via INTRAVENOUS
  Filled 2017-02-01 (×3): qty 50

## 2017-02-01 MED ORDER — MAGNESIUM SULFATE IN D5W 1-5 GM/100ML-% IV SOLN
1.0000 g | Freq: Once | INTRAVENOUS | Status: AC
  Administered 2017-02-01: 1 g via INTRAVENOUS
  Filled 2017-02-01: qty 100

## 2017-02-01 MED ORDER — VITAL HIGH PROTEIN PO LIQD
1000.0000 mL | ORAL | Status: DC
  Administered 2017-02-01: 1000 mL

## 2017-02-01 MED ORDER — POTASSIUM CHLORIDE 10 MEQ/100ML IV SOLN: 10.0000 meq | INTRAVENOUS | Status: DC

## 2017-02-01 NOTE — Progress Notes (Signed)
Patient ID: Darrell [name], male   DOB: [birth year], 61 y.o.   MRN: [id number]     Advanced Heart Failure Rounding Note   Subjective:    Patient intubated, awake this morning and follows commands.    Slowly weaning pressor requirement, he is now on norepinephrine 40, phenylephrine 200 (was on 340 yesterday), and vasopressin 0.03.  Co-ox is adequate this morning at 62%.  UOP 2400 cc with Lasix gtt but I/O very positive.  CVP 19 this morning. Creatinine slightly lower at 3.3.   PCT 0.42 => 0.61 => 0.96, Tm 99, WBCs 18.  Started on empiric vancomycin/ceftazidime 11/22.  Sputum culture with GPCs in pairs. CXR has shown pulmonary edema.   Hgb down to 6.4, no overt bleeding but heparin gtt held. Getting an addition unit PRBCs today.  DCCV to NSR 11/21.  ECG this am shows NSR with PACs on amiodarone gtt.   Echo: EF 20-25%, severe LV dilation, septal/apical AK.    Objective:   Weight Range: (!) 974 lb 6.9 oz (442 kg) Body mass index is 148.16 kg/m.   Vital Signs:   Temp:  [98.1 F (36.7 C)-99.7 F (37.6 C)] 99.3 F (37.4 C) (11/23 0530) Pulse Rate:  [72-85] 78 (11/23 0530) Resp:  [0-29] 11 (11/23 0530) BP: (71-134)/(23-63) 92/35 (11/23 0306) SpO2:  [86 %-98 %] 91 % (11/23 0530) Arterial Line BP: (77-134)/(31-63) 121/54 (11/23 0530) FiO2 (%):  [60 %-80 %] 70 % (11/23 0530) Weight:  [974 lb 6.9 oz (442 kg)] 974 lb 6.9 oz (442 kg) (11/23 0500) Last BM Date: 01/27/17  Weight change: Filed Weights   01/26/2017 0424 01/31/17 0500 02/01/17 0500  Weight: (!) 345 lb 3.9 oz (156.6 kg) (!) 420 lb (190.5 kg) (!) 974 lb 6.9 oz (442 kg)    Intake/Output:   Intake/Output Summary (Last 24 hours) at 02/01/2017 0732 Last data filed at 02/01/2017 0200 Gross per 24 hour  Intake 5053.7 ml  Output 2450 ml  Net 2603.7 ml      Physical Exam    General: Intubated, awake.   Neck: Thick, JVP 12+, no thyromegaly or thyroid nodule.  Lungs: Decreased breath sounds dependently. CV: nonpalpable PMI.   Heart regular S1/S2, no S3/S4, no murmur.  1+ edema 1/2 to knees bilaterally.   Abdomen: Soft, nontender, no hepatosplenomegaly, no distention.  Skin: Intact without lesions or rashes.  Neurologic: Alert, follow commands.  Extremities: No clubbing or cyanosis.  HEENT: Normal.    Telemetry   NSR with PACs (personally reviewed)  Labs    CBC Recent Labs    01/31/17 0426 02/01/17 0338  WBC 16.0* 18.0*  HGB 7.6* 6.4*  HCT 24.1* 20.2*  MCV 93.8 91.4  PLT 193 132   Basic Metabolic Panel Recent Labs    01/31/17 0426  01/31/17 2202 02/01/17 0338  NA 135   < > 135 134*  K 3.6   < > 3.7 3.5  CL 80*   < > 82* 81*  CO2 42*   < > 40* 37*  GLUCOSE 145*   < > 146* 157*  BUN 93*   < > 90* 90*  CREATININE 3.57*   < > 3.34* 3.30*  CALCIUM 7.4*   < > 7.3* 7.4*  MG 2.0  --   --  1.7  PHOS 3.7  --   --  4.3   < > = values in this interval not displayed.   Liver Function Tests Recent Labs    01/31/17 0426  AST 23  ALT 16*  ALKPHOS 106  BILITOT 3.6*  PROT 5.0*  ALBUMIN 2.4*   No results for input(s): LIPASE, AMYLASE in the last 72 hours. Cardiac Enzymes Recent Labs    01/29/17 1342 02/05/2017 0742 01/23/2017 1547  TROPONINI <0.03 0.15* 0.20*    BNP: BNP (last 3 results) Recent Labs    12/11/16 2330 01/14/17 1436 01/19/2017 1316  BNP 583.0* 687.0* 674.0*    ProBNP (last 3 results) No results for input(s): PROBNP in the last 8760 hours.   D-Dimer No results for input(s): DDIMER in the last 72 hours. Hemoglobin A1C No results for input(s): HGBA1C in the last 72 hours. Fasting Lipid Panel No results for input(s): CHOL, HDL, LDLCALC, TRIG, CHOLHDL, LDLDIRECT in the last 72 hours. Thyroid Function Tests No results for input(s): TSH, T4TOTAL, T3FREE, THYROIDAB in the last 72 hours.  Invalid input(s): FREET3  Other results:   Imaging    Dg Chest Port 1 View  Result Date: 01/31/2017 CLINICAL DATA:  61 year old male with acute on chronic respiratory failure  EXAM: PORTABLE CHEST 1 VIEW COMPARISON:  Prior chest x-ray 01/17/2017 FINDINGS: Patient is intubated. The tip of the endotracheal tube is 3.9 cm above the carina. A right upper extremity PICC is present. The tip overlies the distal SVC. A gastric tube is present. The tip lies inferior to the diaphragm, presumably within the stomach. Stable cardiomegaly. Slightly improved pulmonary vascular congestion. Bilateral veil like opacities project over the lung bases likely reflecting a combination of layering pleural effusions and atelectasis. Patient is status post median sternotomy with evidence of prior CABG. Left subclavian approach cardiac rhythm maintenance device remains in unchanged position with leads projecting over the right atrium and right ventricle. No acute osseous abnormality. IMPRESSION: 1. Slightly improved pulmonary vascular congestion. 2. Otherwise, no significant interval change with stable cardiomegaly and small to moderate bilateral layering effusions with associated bibasilar atelectasis. 3. Stable and satisfactory support apparatus. Electronically Signed   By: Jacqulynn Cadet M.D.   On: 01/31/2017 09:18     Medications:     Scheduled Medications: . aspirin EC  81 mg Oral Daily  . atorvastatin  80 mg Per Tube QPM  . chlorhexidine gluconate (MEDLINE KIT)  15 mL Mouth Rinse BID  . Chlorhexidine Gluconate Cloth  6 each Topical Daily  . insulin aspart  0-20 Units Subcutaneous Q4H  . ipratropium-albuterol  3 mL Nebulization Q6H  . mouth rinse  15 mL Mouth Rinse 10 times per day  . pantoprazole (PROTONIX) IV  40 mg Intravenous Daily  . senna  1 tablet Oral QHS  . sodium chloride flush  10-40 mL Intracatheter Q12H  . sodium chloride flush  3 mL Intravenous Q12H    Infusions: . sodium chloride 250 mL (01/25/2017 2000)  . amiodarone 30 mg/hr (02/01/17 0620)  . cefTAZidime (FORTAZ)  IV Stopped (02/01/17 9798)  . fentaNYL infusion INTRAVENOUS    . furosemide (LASIX) infusion 12 mg/hr  (01/31/17 1430)  . heparin 1,050 Units/hr (01/31/17 1213)  . magnesium sulfate 1 - 4 g bolus IVPB    . norepinephrine (LEVOPHED) Adult infusion 40 mcg/min (01/31/17 2013)  . phenylephrine (NEO-SYNEPHRINE) Adult infusion 200 mcg/min (02/01/17 0131)  . potassium chloride    . vancomycin    . vasopressin (PITRESSIN) infusion - *FOR SHOCK* 0.03 Units/min (02/01/17 0133)    PRN Medications: sodium chloride, acetaminophen, fentaNYL (SUBLIMAZE) injection, fentaNYL (SUBLIMAZE) injection, midazolam, midazolam, ondansetron (ZOFRAN) IV, sodium chloride flush, sodium chloride flush    Patient Profile  Darrell [name] is a 61 y.o. male with h/o CAD, s/p CABG 8182, Systolic CHF due to ICM, S/p St Jude ICD, Paroxysmal Afib/AFL, Chronic Eliquis, HTN, HLD, Depression, DM2, Morbid obesity, CKD stage III, and OSA non-compliant with CPAP. Moved recently from [place].  Pt admitted to [facility] 01/10/2017 with respiratory distress. PEA arrest 01/29/17. Transferred to [facility] 01/13/2017 with VT.   Assessment/Plan   1. Cardiogenic shock: St Jude ICD.  EF 35-40% (ischemic cardiomyopathy) in 10/18.  Admitted to [facility] with respiratory distress/volume overload.  PEA arrest at [facility] 11/20, intubated.  Runs of VT at [facility].  Transferred to [facility].  Of note, he had been in atrial flutter, at times with RVR, since 7/18 => DCCV to NSR 7/21.  Echo (11/21) with EF 20-25%, septal and apical AK. Currently on norepinephrine 40, vasopressin 0.03, and phenylephrine 200 with Co-ox 62% and CVP 19.  Possible component of septic/vasogenic shock with elevated WBCs, PCT elevated (though not markedly) and low grade fever in setting of wide pulse pressure.  - Continue pressor support to keep MAP > 65 or SBP > 95.  Hopefully can continue to wean phenylephrine today.  - On Lasix gtt at 12 mg/hr with good UOP but very positive fluid balance.  Given elevated CVP 19 and ++fluid balance, would increase Lasix gtt to 15 mg/hr. Creatinine marginally lower than  yesterday.    - Unlikely to be candidate for advanced therapies with limited social support and AKI on CKD stage 3.  2. Atrial flutter: Persistent since 7/18, RVR at times.  He had missed only 2 doses Eliquis recently and has been started on heparin gtt (so off anticoagulation for < 24 hrs).  We decided that it would be reasonable to proceed with DCCV given cardiogenic shock in the absence of TEE.  He was sedated and successfully cardioverted to NSR on 11/21, he has been in NSR since that time.  - Continue amiodarone gtt.  - Heparin now on hold with marked anemia.  No overt GI bleeding noted, would restart heparin when hemoglobin stabilizes if no obvious GI bleeding.  3. CAD: s/p CABG 2001.  No chest pain prior to PEA arrest, TnI 0.15 => 0.2.  Worsening EF and shock, would ideally eventually have his coronaries evaluated.  However, with AKI and no STEMI, will need to wait until he clinically stabilizes.  - Continue ASA 81 and statin.  4. PEA arrest: Suspect respiratory driven, pulmonary edema.  5. VT: Patient has had runs of VT.  Rate around 135, has not triggered ICD therapies. No VT overnight.  - Now on amiodarone gtt.  6. ID: See discussion above. Temp 99, PCT elevated, WBCs 19, wide pulse pressure in absence of aortic insufficiency. Suspect cardiogenic shock but there also may be a component of septic shock.  CXR with pulmonary edema.  - Awaiting blood and sputum cultures, sputum does show GPCs with pairs.   - He was strated on empiric vancomycin/ceftrazidime on 11/22.   7. AKI on CKD stage 3: Suspect AKI is hemodynamically mediated due to shock.  Creatinine marginally lower at 3.3.  Good UOP.  8. Anemia: No overt blood loss but hemoglobin down to 6.4.  Heparin gtt held.  Got 1 unit 11/22, getting another unit PRBCs today.  9. Neuro: He is awake/alert on vent.   CRITICAL CARE Performed by: Loralie Champagne  Total critical care time: 40 minutes  Critical care time was exclusive of separately  billable procedures and treating other patients.  Critical care was  necessary to treat or prevent imminent or life-threatening deterioration.  Critical care was time spent personally by me on the following activities: development of treatment plan with patient and/or surrogate as well as nursing, discussions with consultants, evaluation of patient's response to treatment, examination of patient, obtaining history from patient or surrogate, ordering and performing treatments and interventions, ordering and review of laboratory studies, ordering and review of radiographic studies, pulse oximetry and re-evaluation of patient's condition.  Length of Stay: 4  Loralie Champagne, MD  02/01/2017, 7:32 AM  Advanced Heart Failure Team Pager [phone] (M-F; 7a - 4p)  Please contact [facility] for night-coverage after hours (4p -7a ) and weekends on [email or web address]

## 2017-02-01 NOTE — Progress Notes (Signed)
eLink Physician-Brief Progress Note Patient Name: Darrell [name] DOB: March 03, 1956 MRN: [id number]   Date of Service  02/01/2017  HPI/Events of Note  Hgb drop from 7.6 to 6.4  eICU Interventions  Transfuse 1 unit pRBC Post-transfusion CBC     Intervention Category Major Interventions: Other:  [name],Darrell 02/01/2017, 4:21 AM

## 2017-02-01 NOTE — Care Management Note (Signed)
Case Management Note Marvetta Gibbons RN, BSN Unit 4E-Case Manager-- [place] coverage [phone]  Patient Details  Name: Darrell [name] MRN: [id number] Date of Birth: 1955/03/25  Subjective/Objective:   Pt admitted with Acute on chronic respiratory failure, thought to be related to CHF exacerbation -intubated                 Action/Plan: PTA pt was from [facility]- CSW following for return to SNF when medically stable for discharge.   Expected Discharge Date:                  Expected Discharge Plan:  Skilled Nursing Facility  In-House Referral:  Clinical Social Work  Discharge planning Services  CM Consult  Post Acute Care Choice:    Choice offered to:     DME Arranged:    DME Agency:     HH Arranged:    [place] Agency:     Status of Service:  In process, will continue to follow  If discussed at Long Length of Stay Meetings, dates discussed:    Discharge Disposition:   Additional Comments:  Dawayne Patricia, RN 02/01/2017, 9:52 AM

## 2017-02-01 NOTE — Progress Notes (Signed)
Recruitment maneuver was preformed on patient at 0357 for a minute and 30 seconds, due to a drop in sats. The patient was returned to normal settings and will continue to be monitored.

## 2017-02-01 NOTE — Progress Notes (Signed)
PULMONARY / CRITICAL CARE MEDICINE   Name: Darrell [name] MRN: [id number] DOB: 11-05-1955    ADMISSION DATE:  01/20/2017 CONSULTATION DATE: 01/12/2017  REFERRING MD:  Allyson Sabal  CHIEF COMPLAINT: Cardiac arrest  HISTORY OF PRESENT ILLNESS:  Pt is encephelopathic; therefore, this HPI is obtained from chart review. Darrell [name] is a 61 y.o. male with PMH as outlined below.  He was admitted to [facility] 11/19 with AMS, minimally responsive state, bradypnea.  He was found to have acute hypercapnia with hypoxia.  He was also found to have pulmonary edema with presumed CHF exacerbation.  He was started on BiPAP as well as Lasix drip.  On 11/20, despite being on BiPAP for multiple hours, he developed PEA and respiratory arrest.  He had been seen by palliative care prior to this and stated that he wished for full code status.  It is unclear of how long his resuscitation lasted.  Postarrest post arrest, he had hypotension and was subsequently started on Levophed. Overnight, he had some cardiac arrhythmias and on 11/21, had VT.  He was evaluated by cardiology who did not feel that this represented an acute ischemic event, but rather related to electrolyte abnormalities.  11/21, he was transferred to [facility] for further management.  Prior to transfer, had recurrent VT with a few minutes of CPR.  AICD did not fire, unclear of reason.  Device rep being contacted for interrogation.  Amio rebolused.  Cards feels he needs cardiac cath.  01/31/2017 cardiology decline to catheter due to worsening renal function and hypotensive state requiring 3 pressor support.  02/01/2017.  Slow improvement.  Pressors are slowly weaning.  Remains on high FiO2 days of 70%.   SUBJECTIVE:  On full mechanical ventilatory support, awake and alert.  Currently on 3 pressors.  VITAL SIGNS: BP (!) 116/55   Pulse 76   Temp 99.3 F (37.4 C)   Resp 18   Ht 5\' 8"  (1.727 m)   Wt (!) 974 lb 6.9 oz (442 kg)   SpO2 (!) 70%   BMI 148.16  kg/m   HEMODYNAMICS: CVP:  [16 mmHg-19 mmHg] 19 mmHg  VENTILATOR SETTINGS: Vent Mode: PCV FiO2 (%):  [60 %-80 %] 70 % Set Rate:  [16 bmp-20 bmp] 16 bmp PEEP:  [10 cmH20] 10 cmH20 Plateau Pressure:  [27 cmH20-29 cmH20] 29 cmH20  INTAKE / OUTPUT: I/O last 3 completed shifts: In: 8493.3 [I.V.:7148.3; Blood:415; NG/GT:130; IV [id number]:400] Out: 4700 [Urine:4700]   PHYSICAL EXAMINATION: General: Morbidly obese male currently on fullmechanical ventilatory support and in no acute distress HEENT: No neck no JVD PSY: Normal affect Neuro: Neurologically intact.  He is all extremities x4 CV: Heart sounds are regular.  Regular rate and rhythm.  No murmur appreciated. PULM: Mild rhonchi, bibasilar crackles, menaced breath sounds at the base QQ:PYPP, non-tender, bsx4 active  Extremities: warm/dry, 1+ edema.  Venous stasis of the lower extremities noted. Skin: no rashes or lesions    LABS:  BMET Recent Labs  Lab 01/31/17 1846 01/31/17 2202 02/01/17 0338  NA 135 135 134*  K 3.7 3.7 3.5  CL 83* 82* 81*  CO2 41* 40* 37*  BUN 89* 90* 90*  CREATININE 3.40* 3.34* 3.30*  GLUCOSE 151* 146* 157*    Electrolytes Recent Labs  Lab 02/04/2017 0740  01/31/17 0426  01/31/17 1846 01/31/17 2202 02/01/17 0338  CALCIUM  --    < > 7.4*   < > 7.2* 7.3* 7.4*  MG 1.6*  --  2.0  --   --   --  1.7  PHOS  --   --  3.7  --   --   --  4.3   < > = values in this interval not displayed.    CBC Recent Labs  Lab 01/29/17 1342 01/31/17 0426 02/01/17 0338  WBC 9.3 16.0* 18.0*  HGB 9.9* 7.6* 6.4*  HCT 34.7* 24.1* 20.2*  PLT 172 193 170    Coag's Recent Labs  Lab 01/29/17 2115 02/03/2017 0533  01/31/17 1053 01/31/17 1846 02/01/17 0338  APTT  --   --    < > 130* 81* 60*  INR 2.35 2.31  --   --   --   --    < > = values in this interval not displayed.    Sepsis Markers Recent Labs  Lab 02/01/2017 1306 01/29/17 1711 01/21/2017 0740 01/20/2017 1655 01/31/17 0426 02/01/17 0338   LATICACIDVEN 1.18 2.0* 1.1  --   --   --   PROCALCITON  --   --   --  0.42 0.61 0.96    ABG Recent Labs  Lab 01/23/2017 2012 01/31/17 1130 02/01/17 0350  PHART 7.509* 7.497* 7.433  PCO2ART 65.3* 58.5* 65.3*  PO2ART 69.0* 71.9* 90.6    Liver Enzymes Recent Labs  Lab 01/19/2017 1256 01/31/17 0426  AST 22 23  ALT 18 16*  ALKPHOS 132* 106  BILITOT 1.4* 3.6*  ALBUMIN 3.5 2.4*    Cardiac Enzymes Recent Labs  Lab 01/29/17 1342 01/19/2017 0742 02/05/2017 1547  TROPONINI <0.03 0.15* 0.20*    Glucose Recent Labs  Lab 01/31/17 1235 01/31/17 1655 01/31/17 2048 02/01/17 0002 02/01/17 0403 02/01/17 0812  GLUCAP 156* 147* 143* 146* 147* 182*    Imaging Dg Chest Port 1 View  Result Date: 02/01/2017 CLINICAL DATA:  Respiratory failure EXAM: PORTABLE CHEST 1 VIEW COMPARISON:  01/31/2017 FINDINGS: Cardiac shadow is enlarged. Postsurgical changes are again seen. Defibrillator is again noted. Right-sided PICC line, endotracheal tube and nasogastric catheter are seen. The nasogastric catheter has been withdrawn significantly and now lies in the mid esophagus. This should be re- advanced as clinically indicated. Central vascular congestion is again identified with small right effusion. Patient positioning is different than the prior exam. IMPRESSION: Persistent vascular congestion. Nasogastric catheter has been withdrawn and now lies in the mid esophagus. This should be re- advanced as clinically necessary. Electronically Signed   By: Inez Catalina M.D.   On: 02/01/2017 07:38     STUDIES:  CXR 11/21 > vascular congestion, small effusions. Echo 11/21 > EF 20-25%, mild left ventricular hypertrophy, mitral valve was severely calcified annulus, left atrium was dilated, cath 11/21 > canceled  CULTURES: 01/31/2017 blood cultures x2>> 01/31/2017 tracheal aspirate>>  ANTIBIOTICS: 11/22 fortaz>> 11/22 vanc>>  SIGNIFICANT EVENTS: 11/19 > admit. 11/20 > PEA arrest, intubated, started on  levophed 11/21 > episode of VT felt to be due to electrolyte abnormalities, transferred to [facility].  LINES/TUBES: ETT 11/20 >  R PICC 11/20 >  L radial a line 11/21 >   DISCUSSION: 61 y.o. male with hx sCHF (EF 35-50% on echo Oct 2018) and chronic hypercapnic respiratory failure, admitted 11/19 with AoC hypercapnia and hypoxia, pulmonary edema.  11/20 he suffered PEA arrest and was subsequently intubated.  11/21 he was transferred to [facility] for further management.  Currently on 3 vasopressors, pulmonary wise is stable.  May need renal consult in CVVH for volume overload as he remains in a positive fluid balance despite Lasix drip.  Lasix drip increased to 50 mg an hour  02/01/2017.  ASSESSMENT / PLAN:  PULMONARY A: Respiratory insufficiency - s/p intubation after PEA arrest. Hx chronic hypercapnic and hypoxic respiratory failure. Hx OSA. P:   Full vent support. Wean as able.  02/01/2017 remains on 70% FiO2 with a PO2 of 65 VAP prevention measures. Hold SBT until hemodynamics improved. CXR daily Nocturnal CPAP once extubated.  CARDIOVASCULAR A:  PEA arrest - presumed due to hypoxia.  Not a candidate for hypothermia as PEA occurred 1 day ago. Cardiogenic shock - co-ox noted to be 35. Troponin bump - due to above. VT - felt to be due to electrolyte abnormalities. 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 P:  Cardiology notified of transfer and is following the patient daily Cardiology consult advanced heart failure team. Currently requiring 3 pressors Levophed, Neo-Synephrine, vasopressin Continue amiodarone per cardiology Likely needs inotropic support, held so far due to recurrent VT.  Will defer to cardiology on timing of starting this.  Suspect unit of blood will help with blood pressure. F/u troponins, CVP, co-ox, echo. Ensure electrolytes corrected, maintain K > 4 and Mg > 2. Heparin gtt in lieu of preadmission  apixaban.  02/01/2017 heparin currently on hold for anemia of hemoglobin 6.7.  Will reassess timing of resuming anticoagulation. Continue preadmission ASA, atorvastatin. Hold preadmission carvedilol, metolazone, torsemide.  RENAL Lab Results  Component Value Date   CREATININE 3.30 (H) 02/01/2017   CREATININE 3.34 (H) 01/31/2017   CREATININE 3.40 (H) 01/31/2017   Recent Labs  Lab 01/31/17 1846 01/31/17 2202 02/01/17 0338  K 3.7 3.7 3.5     Intake/Output Summary (Last 24 hours) at 02/01/2017 0943 Last data filed at 02/01/2017 0900 Gross per 24 hour  Intake 6304.8 ml  Output 2925 ml  Net 3379.8 ml     A:   AoCKD. P:   Might need renal replacement therapy. Consider nephrology consult BMP Currently on Lasix drip with positive I and O   GASTROINTESTINAL A:   GI prophylaxis. Nutrition. P:   SUP: Pantoprazole. NPO. Will need tube feedings, dietary consult ordered 01/31/2017  HEMATOLOGIC Recent Labs    01/31/17 0426 02/01/17 0338  HGB 7.6* 6.4*   A:   Anemia - chronic. VTE Prophylaxis. Chronic anticoagulation - apixaban. P:  Transfuse per protocol and in the setting of coronary artery disease he is being transfused 1 unit of packed cells per cardiology 02/01/2017 Overt source of blood loss. Heparin drip will be held 02/02/2012 due to anemia, platelets are 170 CBC daily  INFECTIOUS A:   Pro-calcitonin 0.61 White count is 16->18 Overnight T-max 99.5 P:   Blood cultures x2>> Sputum culture>> mod sa>> Trend WBC, temperature curve, other indicators of infection 11/22 vanc and fortaz empirically   ENDOCRINE CBG (last 3)  Recent Labs    02/01/17 0002 02/01/17 0403 02/01/17 0812  GLUCAP 146* 147* 182*    A:   Hx DM (med controlled), sick-euthyroid. P:   SSI.   NEUROLOGIC A:   Sedation due to mechanical ventilation. He is awake and comfortable on full mechanical ventilatory support P:   Sedation:  Fentanyl gtt / Midazolam PRN. RASS goal:  0 to -1. Daily WUA. Hold preadmission gabapentin, ropinirole. Currently on full mechanical ventilatory support and only required as needed sedation  Family updated: Niece, Donia Guiles called over the phone but no answer (she had been the person that nursing had been in communication with). 01/31/2017 no family at the bedside currently.  02/01/2017 no family at the bedside currently. Interdisciplinary Family Meeting v Palliative Care Meeting:  Due by: 02/05/17.  CC time: 40 min.  Richardson Landry Luretha Eberly ACNP Maryanna Shape PCCM Pager [phone] till 1 pm If no answer page [phone] 02/01/2017, 9:43 AM

## 2017-02-01 NOTE — Progress Notes (Signed)
[place] for heparin Indication: atrial fibrillation  Allergies  Allergen Reactions  . Fish Allergy   . Ampicillin Rash  . Clindamycin/Lincomycin Rash  . Penicillins Rash    Has patient had a PCN reaction causing immediate rash, facial/tongue/throat swelling, SOB or lightheadedness with hypotension: Yes Has patient had a PCN reaction causing severe rash involving mucus membranes or skin necrosis: No Has patient had a PCN reaction that required hospitalization: No Has patient had a PCN reaction occurring within the last 10 years: No If all of the above answers are "NO", then may proceed with Cephalosporin use.     Patient Measurements: Height: 5\' 8"  (172.7 cm) Weight: (!) 974 lb 6.9 oz (442 kg) IBW/kg (Calculated) : 68.4 Heparin Dosing Weight: 108kg  Vital Signs: Temp: 99.3 F (37.4 C) (11/23 1000) Temp Source: Core (11/23 0830) BP: 116/55 (11/23 0858) Pulse Rate: 74 (11/23 1000)  Labs: Recent Labs    01/29/17 1342 01/29/17 2115 01/29/2017 0533 01/21/2017 0742  01/24/2017 1547  01/31/17 0030 01/31/17 0426 01/31/17 1053  01/31/17 1846 01/31/17 2202 02/01/17 0338 02/01/17 1017  HGB 9.9*  --   --   --   --   --   --   --  7.6*  --   --   --   --  6.4* 7.2*  HCT 34.7*  --   --   --   --   --   --   --  24.1*  --   --   --   --  20.2* 22.0*  PLT 172  --   --   --   --   --   --   --  193  --   --   --   --  170 166  APTT  --   --   --   --   --   --    < > 157*  --  130*  --  81*  --  60*  --   LABPROT  --  25.5* 25.2*  --   --   --   --   --   --   --   --   --   --   --   --   INR  --  2.35 2.31  --   --   --   --   --   --   --   --   --   --   --   --   HEPARINUNFRC  --   --   --   --   --   --   --  >2.20*  --   --   --   --   --  >2.20*  --   CREATININE 3.78* 3.65* 3.46*  --    < > 3.57*   < >  --  3.57*  --    < > 3.40* 3.34* 3.30*  --   TROPONINI <0.03  --   --  0.15*  --  0.20*  --   --   --   --   --   --   --   --   --    <  > = values in this interval not displayed.    Estimated Creatinine Clearance: 72.4 mL/min (A) (by C-G formula based on SCr of 3.3 mg/dL (H)).   Assessment: 56 yoM s/p PEA arrest on 11/20 and VFib arrest 11/21 - [facility] to  [facility]. Pt on Eliquis PTA for AFib- was resumed at [facility] with last dose given 11/20 at ~1000. Now to transition to IV heparin.  Labs this morning showed therapeutic aPTT but Hgb dropped to 6.4. No overt source or S/Sx bleeding noted. Holding heparin infusion for now per CCM, 1 unit pRBC ordered.  Goal of Therapy:  Heparin level 0.3-0.7 units/ml aPTT 66-102 seconds Monitor platelets by anticoagulation protocol: Yes   Plan:  -Hold heparin today -Watch CBC closely -Monitor for s/sx of bleeding  Arrie Senate, PharmD, BCPS PGY-2 Cardiology Pharmacy Resident Pager: [phone] 02/01/2017

## 2017-02-01 NOTE — Progress Notes (Signed)
Initial Nutrition Assessment  DOCUMENTATION CODES:   Morbid obesity  INTERVENTION:   Vital High Protein @ 40 ml/hr (960 ml/day) 60 ml Prostat TID Provides: 1560 kcal, 174 grams protein, and 802 ml free water.    NUTRITION DIAGNOSIS:   Inadequate oral intake related to inability to eat as evidenced by NPO status.  GOAL:   Provide needs based on ASPEN/SCCM guidelines  MONITOR:   TF tolerance, Vent status, I & O's  REASON FOR ASSESSMENT:   Consult, Ventilator    ASSESSMENT:   Pt with PMH of sCHF (EF 35-50% on echo Oct 2018) and chronic hypercapnic respiratory failure, admitted 11/19 with AoC hypercapnia and hypoxia, pulmonary edema.  11/20 he suffered PEA arrest and was subsequently intubated.  11/21 he was transferred to [facility] for further management.     Pt awake and alert on vent. Spoke with RN at bedside.   Currently on 3 vasopressors, pulmonary wise is stable.  May need renal consult in CVVH for volume overload as he remains in a positive fluid balance despite Lasix drip.  Lasix drip increased to 50 mg an hour 02/01/2017.  Medications reviewed and include: senokot, lasix, KCl Labs reviewed: K+, PO4, and magnesium are WNL, BUN/Cr 90/3.3 (H) Vasopressin, levo, neo, amiodarone, lasix drips    NUTRITION - FOCUSED PHYSICAL EXAM:    Most Recent Value  Orbital Region  No depletion  Upper Arm Region  No depletion  Thoracic and Lumbar Region  No depletion  Buccal Region  No depletion  Temple Region  No depletion  Clavicle Bone Region  No depletion  Clavicle and Acromion Bone Region  No depletion  Scapular Bone Region  Unable to assess  Dorsal Hand  No depletion  Patellar Region  No depletion  Anterior Thigh Region  No depletion  Posterior Calf Region  No depletion  Edema (RD Assessment)  Mild  Hair  Reviewed  Eyes  Reviewed  Mouth  Unable to assess  Skin  Reviewed  Nails  Reviewed       Diet Order:  Diet NPO time specified  EDUCATION NEEDS:   No education  needs have been identified at this time  Skin:  Skin Assessment: Skin Integrity Issues: Skin Integrity Issues:: (cellulitis: BLE, MASD: abd, groin, scrotum, perineum, skin tear: perineum and arm)  Last BM:  11/18  Height:   Ht Readings from Last 1 Encounters:  02/06/2017 5\' 8"  (1.727 m)    Weight:   Wt Readings from Last 1 Encounters:  02/01/17 (!) 974 lb 6.9 oz (442 kg)  150 kg on admission   Ideal Body Weight:  70 kg  BMI:  Body mass index is 148.16 kg/m.  Estimated Nutritional Needs:   Kcal:  1540-1750  Protein:  140-175 grams  Fluid:  > 1.5 L/day  Maylon Peppers RD, LDN, CNSC [phone] Pager [phone] After Hours Pager

## 2017-02-01 NOTE — Progress Notes (Signed)
NG tube was advance due to Chest xray reading prior to starting Tube feeding. Verified placement by auscultation and measurement. Will continue to monitor.

## 2017-02-01 NOTE — Progress Notes (Signed)
PCCM INTERVAL PROGRESS NOTE   Called by RN for patient having runs of vtach up to 21 beats. On amio drip and several pressors at high doses. Unable to wean off levo in favor less arrhythmogenic drug. Mag 1.8. K 3.6. Will bolus 150mg  amio via infusion. 2g mag and 74meq K.    Georgann Housekeeper, AGACNP-BC [facility] Pulmonology/Critical Care Pager [phone] or [phone]  02/01/2017 5:31 PM

## 2017-02-01 NOTE — Progress Notes (Signed)
Patient is from [place] will continue to follow and assist with DC planning when pt is medically stable  Jorge Ny, [place] Social Worker [phone]

## 2017-02-02 ENCOUNTER — Inpatient Hospital Stay (HOSPITAL_COMMUNITY): Payer: Medicaid Other

## 2017-02-02 DIAGNOSIS — A419 Sepsis, unspecified organism: Secondary | ICD-10-CM

## 2017-02-02 DIAGNOSIS — R6521 Severe sepsis with septic shock: Secondary | ICD-10-CM

## 2017-02-02 LAB — GLUCOSE, CAPILLARY
GLUCOSE-CAPILLARY: 175 mg/dL — AB (ref 65–99)
GLUCOSE-CAPILLARY: 186 mg/dL — AB (ref 65–99)
GLUCOSE-CAPILLARY: 166 mg/dL — AB (ref 65–99)
Glucose-Capillary: 149 mg/dL — ABNORMAL HIGH (ref 65–99)
Glucose-Capillary: 168 mg/dL — ABNORMAL HIGH (ref 65–99)
Glucose-Capillary: 225 mg/dL — ABNORMAL HIGH (ref 65–99)

## 2017-02-02 LAB — POCT I-STAT 3, ART BLOOD GAS (G3+)
ACID-BASE EXCESS: 12 mmol/L — AB (ref 0.0–2.0)
Acid-Base Excess: 14 mmol/L — ABNORMAL HIGH (ref 0.0–2.0)
BICARBONATE: 41.7 mmol/L — AB (ref 20.0–28.0)
BICARBONATE: 40.3 mmol/L — AB (ref 20.0–28.0)
O2 SAT: 84 %
O2 Saturation: 90 %
PCO2 ART: 77.3 mmHg — AB (ref 32.0–48.0)
Patient temperature: 99.3
TCO2: 44 mmol/L — AB (ref 22–32)
TCO2: 43 mmol/L — AB (ref 22–32)
pCO2 arterial: 76.2 mmHg (ref 32.0–48.0)
pH, Arterial: 7.342 — ABNORMAL LOW (ref 7.350–7.450)
pH, Arterial: 7.331 — ABNORMAL LOW (ref 7.350–7.450)
pO2, Arterial: 56 mmHg — ABNORMAL LOW (ref 83.0–108.0)
pO2, Arterial: 67 mmHg — ABNORMAL LOW (ref 83.0–108.0)

## 2017-02-02 LAB — BASIC METABOLIC PANEL
Anion gap: 15 (ref 5–15)
BUN: 92 mg/dL — AB (ref 6–20)
CALCIUM: 7.4 mg/dL — AB (ref 8.9–10.3)
CO2: 34 mmol/L — ABNORMAL HIGH (ref 22–32)
CREATININE: 3.08 mg/dL — AB (ref 0.61–1.24)
Chloride: 84 mmol/L — ABNORMAL LOW (ref 101–111)
GFR calc Af Amer: 24 mL/min — ABNORMAL LOW (ref >60)
GFR, EST NON AFRICAN AMERICAN: 20 mL/min — AB (ref >60)
GLUCOSE: 221 mg/dL — AB (ref 65–99)
Potassium: 4 mmol/L (ref 3.5–5.1)
Sodium: 133 mmol/L — ABNORMAL LOW (ref 135–145)

## 2017-02-02 LAB — CBC WITH DIFFERENTIAL/PLATELET
BASOS PCT: 0 %
Basophils Absolute: 0 10*3/uL (ref 0.0–0.1)
EOS PCT: 0 %
Eosinophils Absolute: 0 10*3/uL (ref 0.0–0.7)
HCT: 20.8 % — ABNORMAL LOW (ref 39.0–52.0)
Hemoglobin: 6.7 g/dL — CL (ref 13.0–17.0)
LYMPHS ABS: 1.2 10*3/uL (ref 0.7–4.0)
Lymphocytes Relative: 7 %
MCH: 30.2 pg (ref 26.0–34.0)
MCHC: 32.2 g/dL (ref 30.0–36.0)
MCV: 93.7 fL (ref 78.0–100.0)
MONO ABS: 2.5 10*3/uL — AB (ref 0.1–1.0)
Monocytes Relative: 15 %
NEUTROS ABS: 13.2 10*3/uL — AB (ref 1.7–7.7)
Neutrophils Relative %: 78 %
PLATELETS: 180 10*3/uL (ref 150–400)
RBC: 2.22 MIL/uL — AB (ref 4.22–5.81)
RDW: 17.5 % — AB (ref 11.5–15.5)
WBC: 16.9 10*3/uL — AB (ref 4.0–10.5)

## 2017-02-02 LAB — CBC
HCT: 21.5 % — ABNORMAL LOW (ref 39.0–52.0)
HEMOGLOBIN: 7 g/dL — AB (ref 13.0–17.0)
MCH: 30.3 pg (ref 26.0–34.0)
MCHC: 32.6 g/dL (ref 30.0–36.0)
MCV: 93.1 fL (ref 78.0–100.0)
Platelets: 158 10*3/uL (ref 150–400)
RBC: 2.31 MIL/uL — AB (ref 4.22–5.81)
RDW: 17.8 % — ABNORMAL HIGH (ref 11.5–15.5)
WBC: 16.8 10*3/uL — AB (ref 4.0–10.5)

## 2017-02-02 LAB — COOXEMETRY PANEL
CARBOXYHEMOGLOBIN: 1.4 % (ref 0.5–1.5)
METHEMOGLOBIN: 1.3 % (ref 0.0–1.5)
O2 Saturation: 69.2 %
Total hemoglobin: 6.2 g/dL — CL (ref 12.0–16.0)

## 2017-02-02 LAB — PREPARE RBC (CROSSMATCH)

## 2017-02-02 LAB — MAGNESIUM
Magnesium: 2.2 mg/dL (ref 1.7–2.4)
Magnesium: 2 mg/dL (ref 1.7–2.4)

## 2017-02-02 LAB — APTT: aPTT: 35 seconds (ref 24–36)

## 2017-02-02 LAB — PHOSPHORUS
Phosphorus: 5.1 mg/dL — ABNORMAL HIGH (ref 2.5–4.6)
Phosphorus: 5.3 mg/dL — ABNORMAL HIGH (ref 2.5–4.6)

## 2017-02-02 MED ORDER — IPRATROPIUM-ALBUTEROL 0.5-2.5 (3) MG/3ML IN SOLN: 3.0000 mL | Freq: Three times a day (TID) | RESPIRATORY_TRACT | Status: DC

## 2017-02-02 MED ORDER — EPINEPHRINE PF 1 MG/ML IJ SOLN
0.5000 ug/min | INTRAMUSCULAR | Status: DC
  Administered 2017-02-02: 10 ug/min via INTRAVENOUS
  Administered 2017-02-02: 5 ug/min via INTRAVENOUS
  Administered 2017-02-02: 10 ug/min via INTRAVENOUS
  Filled 2017-02-02 (×2): qty 4

## 2017-02-02 MED ORDER — MIDAZOLAM HCL 2 MG/2ML IJ SOLN: 1.0000 mg | INTRAMUSCULAR | Status: DC | PRN

## 2017-02-02 MED ORDER — SODIUM CHLORIDE 0.9 % IV SOLN
Freq: Once | INTRAVENOUS | Status: AC
  Administered 2017-02-02: 05:00:00 via INTRAVENOUS

## 2017-02-02 MED ORDER — FENTANYL CITRATE (PF) 100 MCG/2ML IJ SOLN: 25.0000 ug | INTRAMUSCULAR | Status: DC | PRN

## 2017-02-02 MED FILL — Medication: Qty: 1 | Status: AC

## 2017-02-03 LAB — BPAM RBC
BLOOD PRODUCT EXPIRATION DATE: 201812112359
Blood Product Expiration Date: 201812102359
Blood Product Expiration Date: 201812132359
ISSUE DATE / TIME: 201811221116
ISSUE DATE / TIME: 201811230448
ISSUE DATE / TIME: 201811240455
UNIT TYPE AND RH: 7300
UNIT TYPE AND RH: 7300
Unit Type and Rh: 7300

## 2017-02-03 LAB — TYPE AND SCREEN
ABO/RH(D): B POS
Antibody Screen: NEGATIVE
UNIT DIVISION: 0
UNIT DIVISION: 0
Unit division: 0

## 2017-02-03 MED FILL — Medication: Qty: 1 | Status: AC

## 2017-02-04 LAB — CULTURE, RESPIRATORY

## 2017-02-04 LAB — CULTURE, RESPIRATORY W GRAM STAIN: Special Requests: NORMAL

## 2017-02-05 LAB — CULTURE, BLOOD (ROUTINE X 2)
Culture: NO GROWTH
Culture: NO GROWTH
Special Requests: ADEQUATE

## 2017-02-05 LAB — POCT I-STAT 3, ART BLOOD GAS (G3+)
ACID-BASE EXCESS: 24 mmol/L — AB (ref 0.0–2.0)
BICARBONATE: 50.3 mmol/L — AB (ref 20.0–28.0)
O2 Saturation: 93 %
PCO2 ART: 65.4 mmHg — AB (ref 32.0–48.0)
PO2 ART: 66 mmHg — AB (ref 83.0–108.0)
Patient temperature: 37.5
TCO2: >50 mmol/L — ABNORMAL HIGH (ref 22–32)
pH, Arterial: 7.496 — ABNORMAL HIGH (ref 7.350–7.450)

## 2017-02-05 MED FILL — Medication: Qty: 1 | Status: AC

## 2017-02-06 ENCOUNTER — Telehealth: Payer: Self-pay

## 2017-02-06 NOTE — Telephone Encounter (Signed)
On 02/06/17 I received a d/c from [facility] (faxed). The d/c is for cremation. The patient is a patient of Educational [facility]. The d/c will be taken to Pulmonary Unit @ [facility] this am for signature.  On 02/07/17 I received the d/c back from [place]. I got the d/c ready and faxed the d/c to the funeral home per the funeral home request.

## 2017-02-09 NOTE — Progress Notes (Signed)
Patient ID: Darrell Black, male   DOB: 1955-09-23, 61 y.o.   MRN: [id number]     Advanced Heart Failure Rounding Note   Subjective:    Had VT arrest x 2 overnight after NE increased. NE turned down. Now on NE 20, epi 10, vasopressin  and neo 320   Family has made DNR  Currently sedated on vent but RNs say he will awaken and follow commands. Co-ox 69%. CVP 22. Creatinine 3.3-> 3.1  Now back in AFL despite IV amio   DCCV to NSR 11/21.   Echo: EF 20-25%, severe LV dilation, septal/apical AK.    Objective:   Weight Range: (!) 200.5 kg (442 lb) Body mass index is 67.21 kg/m.   Vital Signs:   Temp:  [98.1 F (36.7 C)-99.7 F (37.6 C)] 98.4 F (36.9 C) (11/24 1100) Pulse Rate:  [85-136] 86 (11/24 1100) Resp:  [0-56] 19 (11/24 1100) BP: (85-119)/(27-57) 87/57 (11/23 2013) SpO2:  [71 %-100 %] 90 % (11/24 1100) Arterial Line BP: (59-135)/(14-57) 131/49 (11/24 1100) FiO2 (%):  [85 %-100 %] 100 % (11/24 0805) Weight:  [200.5 kg (442 lb)] 200.5 kg (442 lb) (11/24 0500) Last BM Date: 01/27/17  Weight change: Filed Weights   01/31/17 0500 02/01/17 0500 2017/02/04 0500  Weight: (!) 190.5 kg (420 lb) (!) 442 kg (974 lb 6.9 oz) (!) 200.5 kg (442 lb)    Intake/Output:   Intake/Output Summary (Last 24 hours) at 02-04-17 1139 Last data filed at 02/04/17 1100 Gross per 24 hour  Intake 5994.79 ml  Output 953 ml  Net 5041.79 ml      Physical Exam    General:  Critically ill appearing. Sedated on vent HEENT: normal x for ETT and poor dentition  Neck: supple. JVP to ear . Carotids 2+ bilat; no bruits. No lymphadenopathy or thryomegaly appreciated. Cor: PMI nonpalpable  Regular rate & rhythm. distant Lungs: +rhocnhi Abdomen: markedly obese soft, nontender, nondistended. No hepatosplenomegaly. No bruits or masses. Hypoactive bowel sounds. Extremities: no cyanosis, clubbing, rash, 2+ edema Neuro: intubated sedated   Telemetry   AFL with v pacing Personally reviewed   Labs     CBC Recent Labs    02/01/17 1017 02/04/2017 0337 02/04/2017 1103  WBC 16.5* 16.9* 16.8*  NEUTROABS 13.8* 13.2*  --   HGB 7.2* 6.7* 7.0*  HCT 22.0* 20.8* 21.5*  MCV 91.3 93.7 93.1  PLT 166 180 500   Basic Metabolic Panel Recent Labs    02/01/17 0338 02/01/17 1637 2017/02/04 0337  NA 134*  --  133*  K 3.5 3.5 4.0  CL 81*  --  84*  CO2 37*  --  34*  GLUCOSE 157*  --  221*  BUN 90*  --  92*  CREATININE 3.30*  --  3.08*  CALCIUM 7.4*  --  7.4*  MG 1.7 1.8 2.2  PHOS 4.3 4.3 5.1*   Liver Function Tests Recent Labs    01/31/17 0426  AST 23  ALT 16*  ALKPHOS 106  BILITOT 3.6*  PROT 5.0*  ALBUMIN 2.4*   No results for input(s): LIPASE, AMYLASE in the last 72 hours. Cardiac Enzymes Recent Labs    01/29/2017 1547  TROPONINI 0.20*    BNP: BNP (last 3 results) Recent Labs    12/11/16 2330 01/14/17 1436 01/13/2017 1316  BNP 583.0* 687.0* 674.0*    ProBNP (last 3 results) No results for input(s): PROBNP in the last 8760 hours.   D-Dimer No results for input(s): DDIMER in the last  72 hours. Hemoglobin A1C No results for input(s): HGBA1C in the last 72 hours. Fasting Lipid Panel No results for input(s): CHOL, HDL, LDLCALC, TRIG, CHOLHDL, LDLDIRECT in the last 72 hours. Thyroid Function Tests No results for input(s): TSH, T4TOTAL, T3FREE, THYROIDAB in the last 72 hours.  Invalid input(s): FREET3  Other results:   Imaging    Dg Chest Port 1 View  Result Date: 02/21/2017 CLINICAL DATA:  Post CPR. EXAM: PORTABLE CHEST 1 VIEW COMPARISON:  Chest radiograph yesterday. FINDINGS: Endotracheal tube 2.8 cm from the carina. Enteric tube has been advanced, tip now below the diaphragm, side-port not well visualized. Right upper extremity PICC tip in the region of the SVC, obscured by overlying artifact. Post median sternotomy. Cardiomegaly again seen. Increasing right pleural effusion. Worsening pulmonary vascular congestion. No demonstrated pneumothorax. IMPRESSION: 1.  Increasing right pleural effusion and worsening vascular congestion. Cardiomegaly grossly stable. 2. Tip of the enteric tube below the diaphragm, likely in the stomach. Endotracheal tube in right upper extremity PICC remain in place. Electronically Signed   By: Jeb Levering M.D.   On: February 21, 2017 04:03     Medications:     Scheduled Medications: . aspirin EC  81 mg Oral Daily  . atorvastatin  80 mg Per Tube QPM  . chlorhexidine gluconate (MEDLINE KIT)  15 mL Mouth Rinse BID  . Chlorhexidine Gluconate Cloth  6 each Topical Daily  . feeding supplement (PRO-STAT SUGAR FREE 64)  60 mL Per Tube TID  . feeding supplement (VITAL HIGH PROTEIN)  1,000 mL Per Tube Q24H  . insulin aspart  0-20 Units Subcutaneous Q4H  . mouth rinse  15 mL Mouth Rinse 10 times per day  . pantoprazole (PROTONIX) IV  40 mg Intravenous Daily  . senna  1 tablet Oral QHS  . sodium chloride flush  10-40 mL Intracatheter Q12H  . sodium chloride flush  3 mL Intravenous Q12H    Infusions: . sodium chloride Stopped (2017-02-21 0200)  . amiodarone 30 mg/hr (02/21/2017 1123)  . cefTAZidime (FORTAZ)  IV Stopped (02/21/2017 0501)  . epinephrine 10 mcg/min (2017-02-21 1123)  . fentaNYL infusion INTRAVENOUS 50 mcg/hr (Feb 21, 2017 0630)  . furosemide (LASIX) infusion 15 mg/hr (2017-02-21 0543)  . norepinephrine (LEVOPHED) Adult infusion 20 mcg/min (Feb 21, 2017 0522)  . phenylephrine (NEO-SYNEPHRINE) Adult infusion 360 mcg/min (2017/02/21 1130)  . vancomycin 1,750 mg (02/01/17 1630)  . vasopressin (PITRESSIN) infusion - *FOR SHOCK* 0.03 Units/min (2017-02-21 0522)    PRN Medications: sodium chloride, acetaminophen, fentaNYL (SUBLIMAZE) injection, fentaNYL (SUBLIMAZE) injection, midazolam, midazolam, ondansetron (ZOFRAN) IV, sodium chloride flush, sodium chloride flush    Patient Profile   Darrell Black is a 61 y.o. male with h/o CAD, s/p CABG 0175, Systolic CHF due to ICM, S/p St Jude ICD, Paroxysmal Afib/AFL, Chronic Eliquis, HTN, HLD,  Depression, DM2, Morbid obesity, CKD stage III, and OSA non-compliant with CPAP. Moved recently from [place].  Pt admitted to [facility] 01/23/2017 with respiratory distress. PEA arrest 01/29/17. Transferred to [facility] 02/04/2017 with VT.   Assessment/Plan   1. Cardiogenic shock: St Jude ICD.  EF 35-40% (ischemic cardiomyopathy) in 10/18.  Admitted to [facility] with respiratory distress/volume overload.  PEA arrest at [facility] 11/20, intubated.  Runs of VT at [facility].  Transferred to [facility].  Of note, he had been in atrial flutter, at times with RVR, since 7/18 => DCCV to NSR 7/21.  Echo (11/21) with EF 20-25%, septal and apical AK. Currently on norepinephrine 40, vasopressin 0.03, and phenylephrine 200 with Co-ox 62% and CVP 19.  Possible component of septic/vasogenic shock with elevated WBCs, PCT elevated (though not markedly) and low grade fever in setting of wide pulse pressure.  - He has VT arrest again x 2 last night. Family has now made DNR - On very high-dose pressors support - CVP climbing with poor urine output - Poor candidate for CRRT with high-dose pressor requirement - Agree that comfort care may be best option at this point. CCM planning to d/w family later today 2. Atrial flutter: Persistent since 7/18, RVR at times.  He had missed only 2 doses Eliquis recently and has been started on heparin gtt (so off anticoagulation for < 24 hrs).  We decided that it would be reasonable to proceed with DCCV given cardiogenic shock in the absence of TEE.  He was sedated and successfully cardioverted to NSR on 11/21 - Reverted back to AFL overnight. Rate controlled. Can consider repeat DC-CV though I don't think it will change his prognosis much  - Continue amiodarone gtt.  - Heparin now on hold with marked anemia.  No overt GI bleeding noted, would restart heparin when hemoglobin stabilizes if no obvious GI bleeding.  3. CAD: s/p CABG 2001.  No chest pain prior to PEA arrest, TnI 0.15 => 0.2.  Worsening EF and shock, would  ideally eventually have his coronaries evaluated.  However, with AKI and no STEMI, has not been cath candidate  - Continue ASA 81 and statin.  4. PEA arrest: Suspect respiratory driven, pulmonary edema.  5. VT: Patient has had runs of VT.  - Recurrent VT x 2 overnight. Remains on amio. Will continue. Supp electrolytes as needed. - Now on amiodarone gtt.  - Now DNR 6. ID: See discussion above. Temp 99, PCT elevated, WBCs 19, wide pulse pressure in absence of aortic insufficiency. Suspect cardiogenic shock but there also may be a component of septic shock.  CXR with pulmonary edema.  - Awaiting blood and sputum cultures, sputum does show GPCs with pairs.   - He was strated on empiric vancomycin/ceftrazidime on 11/22.   7. AKI on CKD stage 3: Suspect AKI is hemodynamically mediated due to shock.   - Creatinine stable. Urine output poor  - Poor candidate for CRRT with high-dose pressor requirement 8. Anemia: No overt blood loss but hemoglobin down to 6.4.  Heparin gtt held.  Got 1 unit 11/22 and 11/23 9. Neuro: He is awake/alert on vent.   As above. Prognosis increasingly grim with MSOF and worsening pressor requirements. Suspect comfort care best option.   CRITICAL CARE Performed by: Glori Bickers  Total critical care time: 35 minutes  Critical care time was exclusive of separately billable procedures and treating other patients.  Critical care was necessary to treat or prevent imminent or life-threatening deterioration.  Critical care was time spent personally by me on the following activities: development of treatment plan with patient and/or surrogate as well as nursing, discussions with consultants, evaluation of patient's response to treatment, examination of patient, obtaining history from patient or surrogate, ordering and performing treatments and interventions, ordering and review of laboratory studies, ordering and review of radiographic studies, pulse oximetry and re-evaluation  of patient's condition.  Length of Stay: [place], MD  02-15-2017, 11:39 AM  Advanced Heart Failure Team Pager [phone] (M-F; 7a - 4p)  Please contact [facility] for night-coverage after hours (4p -7a ) and weekends on [email or web address]

## 2017-02-09 NOTE — Progress Notes (Signed)
CODE BLUE NOTE  Patient Name: Darrell Black   MRN: [id number]   Date of Birth/ Sex: 08/29/1955 , male      Admission Date: 01/21/2017  Attending Provider: Javier Glazier, MD  Primary Diagnosis: Acute on chronic respiratory failure with hypercapnia [facility])    Indication: Pt was in his usual state of health until this AM, when he was noted to be PEA. Code blue was subsequently called. At the time of arrival on scene, ACLS protocol was underway.    Technical Description:  - CPR performance duration:  0 minute  - Was defibrillation or cardioversion used? No   - Was external pacer placed? No  - Was patient intubated pre/post CPR? Yes    Medications Administered: Y = Yes; Blank = No Amiodarone    Atropine    Calcium    Epinephrine  Y  Lidocaine    Magnesium    Norepinephrine    Phenylephrine    Sodium bicarbonate    Vasopressin      Post CPR evaluation:  - Final Status - Was patient successfully resuscitated ? Yes - What is current rhythm? tachycardia - What is current hemodynamic status? Stable on epi drip.  Miscellaneous Information:  - Labs sent, including: ABG  - Primary team notified?  Yes  - Family Notified? Yes but no answer  - Additional notes/ transfer status: Critical Care is Primary and is at bedside        Nuala Alpha, DO  February 10, 2017, 4:05 AM

## 2017-02-09 NOTE — Progress Notes (Signed)
   02-26-2017 0500  Clinical Encounter Type  Visited With Patient  Visit Type Code  Consult/Referral To Chaplain  Spiritual Encounters  Spiritual Needs Other (Comment)  Stress Factors  Patient Stress Factors Health changes  Chaplain was paged to code 2x, contacted brother with no answer.  Brother of PT called back to speak with doctor and nurse.

## 2017-02-09 NOTE — Code Documentation (Signed)
CODE BLUE NOTE  Patient Name: Darrell Black   MRN: [id number]   Date of Birth/ Sex: [birth year] , male      Admission Date: 01/20/2017  Attending Provider: Javier Glazier, MD  Primary Diagnosis: Acute pulmonary edema ([place]) [J81.0] Respiratory distress [R06.03] Acute respiratory failure with hypoxia ([place]) [J96.01]    Indication: Pt was in his usual state of health until this PM, when he was noted to be in [place], shock given and code blue was subsequently called. ACLS protocol had ended just prior to arrival.   Technical Description:  - CPR performance duration:  3 minutes  - Was defibrillation or cardioversion used? Yes   - Was external pacer placed? No  - Was patient intubated pre/post CPR? No    Medications Administered: Y = Yes; Blank = No Amiodarone    Atropine    Calcium    Epinephrine  Y  Lidocaine    Magnesium    Norepinephrine    Phenylephrine    Sodium bicarbonate    Vasopressin    Other     Post CPR evaluation:  - Final Status - Was patient successfully resuscitated ? No   Miscellaneous Information:  - Time of death:  06/10/08 PM  - Primary team notified?  Yes  - Family Notified? Yes        Rory Percy, DO   05-Feb-2017, 8:20 PM

## 2017-02-09 NOTE — Progress Notes (Signed)
PULMONARY / CRITICAL CARE MEDICINE   Name: Darrell Black MRN: [id number] DOB: 01-07-1956    ADMISSION DATE:  02/05/2017 CONSULTATION DATE: 01/25/2017  REFERRING MD:  Allyson Sabal  CHIEF COMPLAINT: Cardiac arrest  HISTORY OF PRESENT ILLNESS:  Pt is encephelopathic; therefore, this HPI is obtained from chart review. Darrell Black is a 61 y.o. male with PMH as outlined below.  He was admitted to [facility] 11/19 with AMS, minimally responsive state, bradypnea.  He was found to have acute hypercapnia with hypoxia.  He was also found to have pulmonary edema with presumed CHF exacerbation.  He was started on BiPAP as well as Lasix drip.  On 11/20, despite being on BiPAP for multiple hours, he developed PEA and respiratory arrest.  He had been seen by palliative care prior to this and stated that he wished for full code status.  It is unclear of how long his resuscitation lasted.  Postarrest post arrest, he had hypotension and was subsequently started on Levophed. Overnight, he had some cardiac arrhythmias and on 11/21, had VT.  He was evaluated by cardiology who did not feel that this represented an acute ischemic event, but rather related to electrolyte abnormalities.  11/21, he was transferred to [facility] for further management.  Prior to transfer, had recurrent VT with a few minutes of CPR.  AICD did not fire, unclear of reason.  Device rep being contacted for interrogation.  Amio rebolused.  Cards feels he needs cardiac cath.  01/29/2017 cardiology decline to catheter due to worsening renal function and hypotensive state requiring 3 pressor support.  02/01/2017.  Slow improvement.  Pressors are slowly weaning.  Remains on high FiO2 days of 70%.   SUBJECTIVE:  He has been made a DNR.  He is on maximum oxygen and vasopressor support.  His prognosis is grim.  His interventions been To the current state.  Further discussion with family will be entertained.  VITAL SIGNS: BP (!) 87/57   Pulse 95   Temp 98.2  F (36.8 C)   Resp 20   Ht 5\' 8"  (1.727 m)   Wt (!) 442 lb (200.5 kg)   SpO2 (!) 88%   BMI 67.21 kg/m   HEMODYNAMICS: CVP:  [14 mmHg-29 mmHg] 28 mmHg  VENTILATOR SETTINGS: Vent Mode: PCV FiO2 (%):  [85 %-100 %] 100 % Set Rate:  [16 bmp] 16 bmp PEEP:  [10 cmH20] 10 cmH20 Plateau Pressure:  [25 cmH20-32 cmH20] 30 cmH20  INTAKE / OUTPUT: I/O last 3 completed shifts: In: 8567.7 [I.V.:6334.4; Blood:480; NG/GT:1003.3; IV Piggyback:750] Out: 3180 [Urine:3180]   PHYSICAL EXAMINATION: General: Ill-appearing male, maximum vasopressor support 100% FiO2. HEENT: Endotracheal tube to ventilator. PSY: Sedated  neuro: Arouses at times and is all extremities CV: Heart sounds are regular  PULM:  decreased breath sounds in the bases EP:PIRJ, without bowel sounds Extremities: warm/dry, 2+ edema with venous stasis changes lower extremities Skin: Cool to touch bandage to right ankle.     LABS:  BMET Recent Labs  Lab 01/31/17 2202 02/01/17 0338 02/01/17 1637 2017-02-10 0337  NA 135 134*  --  133*  K 3.7 3.5 3.5 4.0  CL 82* 81*  --  84*  CO2 40* 37*  --  34*  BUN 90* 90*  --  92*  CREATININE 3.34* 3.30*  --  3.08*  GLUCOSE 146* 157*  --  221*    Electrolytes Recent Labs  Lab 01/31/17 2202 02/01/17 0338 02/01/17 1637 February 10, 2017 0337  CALCIUM 7.3* 7.4*  --  7.4*  MG  --  1.7 1.8 2.2  PHOS  --  4.3 4.3 5.1*    CBC Recent Labs  Lab 02/01/17 0338 02/01/17 1017 02/20/17 0337  WBC 18.0* 16.5* 16.9*  HGB 6.4* 7.2* 6.7*  HCT 20.2* 22.0* 20.8*  PLT 170 166 180    Coag's Recent Labs  Lab 01/29/17 2115 01/23/2017 0533  01/31/17 1846 02/01/17 0338 02-20-2017 0337  APTT  --   --    < > 81* 60* 35  INR 2.35 2.31  --   --   --   --    < > = values in this interval not displayed.    Sepsis Markers Recent Labs  Lab 01/14/2017 1306 01/29/17 1711 02/03/2017 0740 01/25/2017 1655 01/31/17 0426 02/01/17 0338  LATICACIDVEN 1.18 2.0* 1.1  --   --   --   PROCALCITON  --   --    --  0.42 0.61 0.96    ABG Recent Labs  Lab 01/31/17 1130 02/01/17 0350 20-Feb-2017 0343  PHART 7.497* 7.433 7.342*  PCO2ART 58.5* 65.3* 77.3*  PO2ART 71.9* 90.6 56.0*    Liver Enzymes Recent Labs  Lab 01/19/2017 1256 01/31/17 0426  AST 22 23  ALT 18 16*  ALKPHOS 132* 106  BILITOT 1.4* 3.6*  ALBUMIN 3.5 2.4*    Cardiac Enzymes Recent Labs  Lab 01/29/17 1342 01/18/2017 0742 01/31/2017 1547  TROPONINI <0.03 0.15* 0.20*    Glucose Recent Labs  Lab 02/01/17 1255 02/01/17 1627 02/01/17 1953 20-Feb-2017 0004 02-20-17 0420 02-20-17 0822  GLUCAP 174* 185* 166* 168* 225* 175*    Imaging Dg Chest Port 1 View  Result Date: 2017/02/20 CLINICAL DATA:  Post CPR. EXAM: PORTABLE CHEST 1 VIEW COMPARISON:  Chest radiograph yesterday. FINDINGS: Endotracheal tube 2.8 cm from the carina. Enteric tube has been advanced, tip now below the diaphragm, side-port not well visualized. Right upper extremity PICC tip in the region of the SVC, obscured by overlying artifact. Post median sternotomy. Cardiomegaly again seen. Increasing right pleural effusion. Worsening pulmonary vascular congestion. No demonstrated pneumothorax. IMPRESSION: 1. Increasing right pleural effusion and worsening vascular congestion. Cardiomegaly grossly stable. 2. Tip of the enteric tube below the diaphragm, likely in the stomach. Endotracheal tube in right upper extremity PICC remain in place. Electronically Signed   By: Jeb Levering M.D.   On: 02/20/17 04:03     STUDIES:  CXR 11/21 > vascular congestion, small effusions. Echo 11/21 > EF 20-25%, mild left ventricular hypertrophy, mitral valve was severely calcified annulus, left atrium was dilated, cath 11/21 > canceled  CULTURES: 01/31/2017 blood cultures x2>> 01/31/2017 tracheal aspirate>>  ANTIBIOTICS: 11/22 fortaz>> 11/22 vanc>>  SIGNIFICANT EVENTS: 11/19 > admit. 11/20 > PEA arrest, intubated, started on levophed 11/21 > episode of VT felt to be due  to electrolyte abnormalities, transferred to [facility].  LINES/TUBES: ETT 11/20 >  R PICC 11/20 >  L radial a line 11/21 >   DISCUSSION: 61 y.o. male with hx sCHF (EF 35-50% on echo Oct 2018) and chronic hypercapnic respiratory failure, admitted 11/19 with AoC hypercapnia and hypoxia, pulmonary edema.  11/20 he suffered PEA arrest and was subsequently intubated.  11/21 he was transferred to [facility] for further management.  Currently on 3 vasopressors, pulmonary wise is stable.  May need renal consult in [facility] for volume overload as he remains in a positive fluid balance despite Lasix drip.  Lasix drip increased to 50 mg an hour 02/01/2017. 02-20-17 he has had multiple codes during the night ventricular tachycardia  and is refractory to all treatments.  He is maxed out on vasopressors and is on 100% FiO2.  Critical care physician spoke with family during the night currently is a DNR no escalation of care.  ASSESSMENT / PLAN:  PULMONARY A: Respiratory insufficiency - s/p intubation after PEA arrest. Hx chronic hypercapnic and hypoxic respiratory failure. Hx OSA. P:   Full vent support.  Currently 100 100% FiO2 He is not weanable  VAP prevention measures. Hold SBT until hemodynamics improved.   CARDIOVASCULAR A:  PEA arrest - presumed due to hypoxia.  Not a candidate for hypothermia as PEA occurred 1 day ago. Cardiogenic shock - co-ox noted to be 35. Troponin bump - due to above. VT - felt to be due to electrolyte abnormalities. 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 2017-02-22 multiple VT arrest during the night. 1124 2016-08-08 he has been made a DNR. P:  Cardiology notified of transfer and is following the patient daily Cardiology consult advanced heart failure team. Currently requiring 3 pressors Levophed, Neo-Synephrine, vasopressin Continue amiodarone per cardiology this will be To current levels on 2017-02-22 and  she is now a DNR. Likely needs inotropic support, held so far due to recurrent VT.  Will defer to cardiology on timing of starting this.  Suspect unit of blood will help with blood pressure. F/u troponins, CVP, co-ox, echo. Ensure electrolytes corrected, maintain K > 4 and Mg > 2. Heparin gtt in lieu of preadmission apixaban.  02/01/2017 heparin currently on hold for anemia of hemoglobin 6.7.  No further anticoagulants.   RENAL Lab Results  Component Value Date   CREATININE 3.08 (H) 02/22/17   CREATININE 3.30 (H) 02/01/2017   CREATININE 3.34 (H) 01/31/2017   Recent Labs  Lab 02/01/17 0338 02/01/17 1637 2017-02-22 0337  K 3.5 3.5 4.0     Intake/Output Summary (Last 24 hours) at 22-Feb-2017 1021 Last data filed at 02/22/17 0900 Gross per 24 hour  Intake 5432.19 ml  Output 1510 ml  Net 3922.19 ml     A:   AoCKD. 22-Feb-2017 continue to have renal failure P:   Might need renal replacement therapy.  Due to multiple codes on Feb 22, 2017 renal replacement therapy option. Consider nephrology consult BMP Currently on Lasix drip with positive I and O   GASTROINTESTINAL A:   GI prophylaxis. Nutrition. P:   SUP: Pantoprazole. NPO. Will need tube feedings, dietary consult ordered 01/31/2017  HEMATOLOGIC Recent Labs    02/01/17 1017 Feb 22, 2017 0337  HGB 7.2* 6.7*   A:   Anemia - chronic. VTE Prophylaxis. Chronic anticoagulation - apixaban. P:  Transfuse per protocol and in the setting of coronary artery disease he is being transfused 1 unit of packed cells per cardiology 02/01/2017 Overt source of blood loss. Heparin drip will be held 02/02/2012 due to anemia, platelets are 170 CBC daily 02/22/2017 his hemoglobin 6.7 he is now full DNR.  INFECTIOUS A:   Pro-calcitonin 0.61 White count is 16->18 Overnight T-max 99.5 P:   Blood cultures x2>> Sputum culture>> mod sa>> Trend WBC, temperature curve, other indicators of infection 11/22 vanc and fortaz  empirically   ENDOCRINE CBG (last 3)  Recent Labs    2017-02-22 0004 02-22-2017 0420 22-Feb-2017 0822  GLUCAP 168* 225* 175*    A:   Hx DM (med controlled), sick-euthyroid. P:   SSI.   NEUROLOGIC A:   Sedation due to mechanical ventilation.  P:  Sedation:  Fentanyl gtt / Midazolam PRN. RASS goal: 0 to -1. Daily WUA. Hold preadmission gabapentin, ropinirole. Currently on full mechanical ventilatory support and only required as needed sedation  Family updated: Niece, Donia Guiles called over the phone but no answer (she had been the person that nursing had been in communication with). 01/31/2017 no family at the bedside currently. 02/01/2017 no family at the bedside currently. Interdisciplinary Family Meeting v Palliative Care Meeting:  Due by: 02/05/17. February 26, 2017 at 0 530 family was contacted by critical care physician and informed him of the multiple codes.  The family at that time expressed a DNR wish.  We will not escalate his care normally shock to CPR.  We will continue current therapy for now.  CC time: 40 min.  Richardson Landry Minor ACNP Maryanna Shape PCCM Pager [phone] till 1 pm If no answer page [phone] 2017-02-26, 10:21 AM

## 2017-02-09 NOTE — Progress Notes (Signed)
Chaplain offered [facility], and end of life Prayers with family at bed side. Demonstrative expressed grief with the the passing. The head of the family Mr. Leonette Most [phone] asked for guidance and help with how to access resources for cremation and memorial.  Sena Hitch (pgr [phone]) will brief this unit's chaplain and we will work with CSW to present family options. This is a dear family mom, dad and 5 girls.   Wife Perlie Gold [phone]

## 2017-02-09 NOTE — Progress Notes (Signed)
Patient has coded again. He is maxed on multiple pressors and being bagged with pox in the 70s. I did speak with his brother and agreed to continue care but he would want him to be DNR. Bedside nurse witnessed the call and conformed it with the brother. Prognosis is grim.

## 2017-02-09 NOTE — Progress Notes (Signed)
No audible heart tones, palpable pulse, or evidence of respiration, time of death verified by 2nd RN, Annitta Jersey.

## 2017-02-09 NOTE — Progress Notes (Signed)
Second phone call to patients brother Ron, discussed poor prognosis with him. Verified per W. Elwyn Reach, MD that brother Ron wished to make patient a DNR status. RN will continue to monitor.

## 2017-02-09 NOTE — Progress Notes (Signed)
2000 family talking with patient, patient nodding and responding 2003 pt noted in VT, shocked x1, CPR started and Code called 2008-2010 niece asked [place] to stop saying [name] would not want this, family close at bedside,  CCM elink MD called and agreed to stopping and rounding MD on the wayj 2012 Chaplain in room to pray with patient 2018 MD speaking with family

## 2017-02-09 NOTE — Progress Notes (Signed)
eLink Physician-Brief Progress Note Patient Name: Darrell Black DOB: [birth year] MRN: [id number]   Date of Service  2017/02/06  HPI/Events of Note    Patient had an episode of recurrent VT, underwent brief CPR and cardioversion. His niece was present and in light of his overall status and prognosis has asked that no further CPR be done. I agree with this plan, feel that it is appropriate based on my chart review and discussions with drs Ashok Cordia and Aljishi.   Dr Elwyn Reach going to bedside to discuss further with her.      eICU Interventions       Intervention Category Major Interventions: End of life / care limitation discussion  [name] February 06, 2017, 8:33 PM

## 2017-02-09 NOTE — Progress Notes (Signed)
RN noted non-pulsatile wave on left radial arterial line. Pt's internal AICD firing at set low rate of 70. Tim, RRT at bedside and suctioned large amount of thick tan secretions from EET. Pt noted to be ashen in color, no femoral pulse per doppler. Code called and ACLS performed per policy, see code sheet. Pt responded to I amp of epi, gtt started per CCM Dr. Estill Batten at bedside. Pt's brother reached Chriss Czar) per Nira Conn, RN and updated on patients declining status. Ron continues to want full support and advanced measures taken. RN will continue to monitor.

## 2017-02-09 NOTE — Death Summary Note (Signed)
DEATH NOTE: For a complete accounting of the patient's history and physical exam on presentation please refer to the H&P dictated on 01/12/2017. Patient was a 61 year old male with past medical history of multiple medical problems. Patient was admitted on 11/19 at outside hospital with a history of recent admission from 11/5 through 11/14 with heart failure found to have an ejection fraction of 35-40 percent. Patient also was found to have acute on chronic hypercarbic respiratory failure at that time. Patient returned on 11/19 to hospital somnolent and in respiratory distress. Patient denied any pain and had a significant oxygen requirement. Patient recently had been discharged from hospital to a local nursing facility. Patient was on BiPAP support at time of EMS arrival for unclear duration. Patient was admitted to outside hospital and started on treatment for presumed acute on chronic systolic congestive heart failure exacerbation with Lasix and continued on noninvasive positive pressure ventilation. On 11/20 the patient suffered a PEA arrest and underwent resuscitation for an unclear length of time. Post arrest the patient was in a state of shock and required vasopressor support. On 11/21 the patient had ventricular tachycardia and was subsequently evaluated by cardiology. With worsening acute on chronic renal failure and his multisystem organ failure he was transferred to our facility for further treatment by critical care specialist and cardiology. Patient was thought to be in a state of cardiogenic shock and was evaluated by the heart failure team as well as critical care medicine. Patient was subsequently started on empiric antibiotics for possible sepsis and pneumonia. Patient suffered cardiac arrest it can early in the morning on Feb 28, 2023 with worsening shock and hypoxia. Family were updated by covering intensivist via phone. The evening of 02/28/2023 patient had an episode of recurrent ventricular tachycardia with  brief CPR and cardioversion. The patient's headaches located was at bedside. The decision was made to make the patient a DO NOT RESUSCITATE at that point. Per documentation patient was pronounced at 8:45 PM on 2017-02-27.  DIAGNOSES AT DEATH: 1. Cardiogenic and septic shock 2. Acute hypoxic respiratory failure 3. Acute on chronic renal failure unknown stage 4. Acute on chronic combined systolic and diastolic congestive heart failure 5. Recurrent cardiac arrest 6. Recurrent ventricular tachycardia 7. Atrial fibrillation with rapid ventricular response 8. MSSA pneumonia 9. Coronary artery disease 10. Major depressive disorder 11. Morbid obesity 12. Obstructive sleep apnea 13. Neuropathy 14. Restless leg syndrome 15. Essential hypertension 16. Hyperlipidemia 17. Diabetes mellitus

## 2017-02-09 NOTE — Progress Notes (Signed)
eLink Physician-Brief Progress Note Patient Name: Darrell Black DOB: August 19, 1955 MRN: [id number]   Date of Service  02/14/17  HPI/Events of Note  Drop in Hgb from 7.2 to 6.7.  Had been on heparin but this was d/ced 11/23.  eICU Interventions  Transfuse 1 unit pRBC Post-transfusion CBC     Intervention Category Intermediate Interventions: Other:  Jobina Maita 02/14/17, 4:30 AM

## 2017-02-09 NOTE — Progress Notes (Signed)
179ml of Fentanyl rinsed down sink, witnessed by Annitta Jersey RN

## 2017-02-09 NOTE — Progress Notes (Signed)
PCCM Attending Phone Discussion:  Discussed patient's multi-system organ failure with his brother Asiah Befort. He wishes to reverse his code status to full code which is at the request of his significant other Ms. Tacy Learn. She is currently improved from [place]. I have placed that order. We will continue maximal supportive the patient and plan for face-to-face update with her and bedside intensivist after her arrival.  Sonia Baller. Ashok Cordia, M.D. [facility] Pager:  [phone] After 7pm or if no response, call [phone] 4:41 PM 02-16-17

## 2017-02-09 NOTE — Progress Notes (Signed)
I was called to see this patient post cardiac arrest and ROSC. It was thought that he had a mucus plug and then arrested. Patient is on levophed, phenylephrine and vasopressin. Patient was seen and examined.  Labs and radiology reviewed. Most recent labs pending. SBP70-80s. Pox 87%. Patient is mving both upper extremities and reaching to the ETT. I will increase PEEP after adding epinephrine (patient went to [facility] after increasing levophed as per report?). Continue Abx. Follow labs.  Tried to contact brother with no answer code status will need to be addressed again. Overall poor prognosis.

## 2017-02-09 NOTE — Progress Notes (Signed)
handprints made and provided to family, questions answered

## 2017-02-09 DEATH — deceased

## 2017-02-11 ENCOUNTER — Telehealth: Payer: Self-pay

## 2017-02-13 NOTE — Telephone Encounter (Signed)
On 02/11/17 I received a dc/ from [facility] (original). The d/c is for cremation. The patient is a patient of [facility]. The d/c will be taken to [facility]-Link for signature. On 02/13/17 I received the d/c back from [place]. I got the d/c ready and called the funeral home to let them know the d/c was mailed to vital records per the funeral home request.

## 2019-04-23 IMAGING — CT CT HEAD W/O CM
3 of 4 series · 13 of 47 positions shown, 15 images · non-contrast
Comparison: 11/22/2016

CLINICAL DATA: 61-year-old male status post fall. Headache. A fib.

EXAM:
CT HEAD WITHOUT CONTRAST
TECHNIQUE: Contiguous axial images were obtained from the base of the skull
through the vertex without intravenous contrast.

[Series 3: head wo · axial · 0.48mm/px · z∈[-280,-160]mm · 7 of 32 slices shown, 9 images]
[im 4/32  brain]
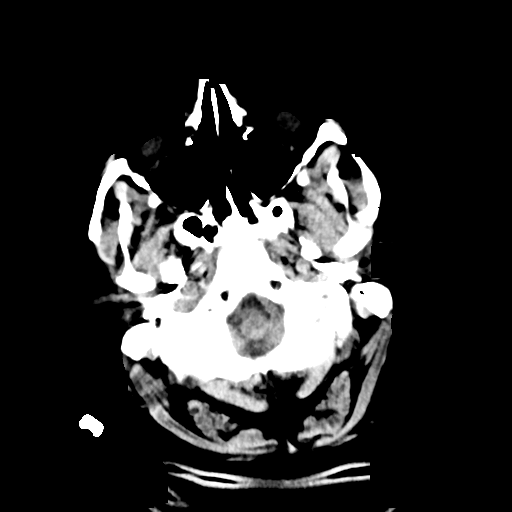
[im 4/32  bone]
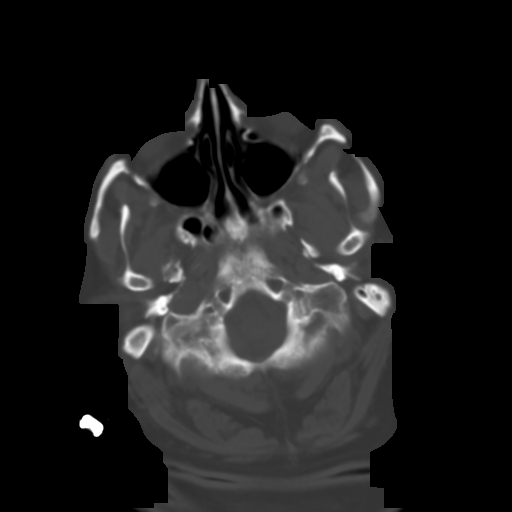
[im 8/32  brain]
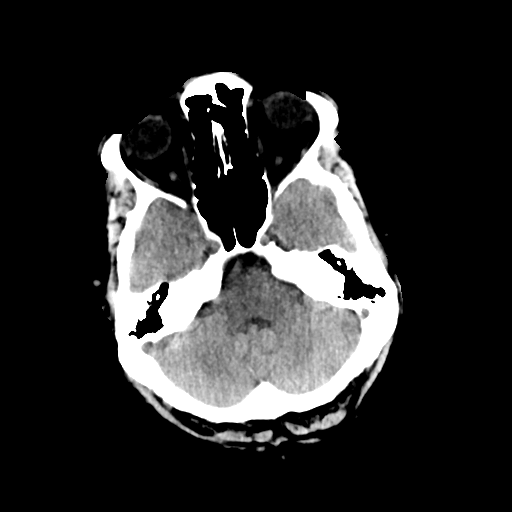
[im 12/32  brain]
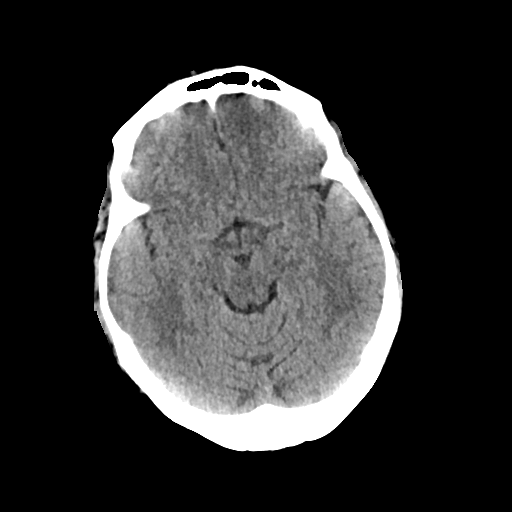
[im 16/32  brain]
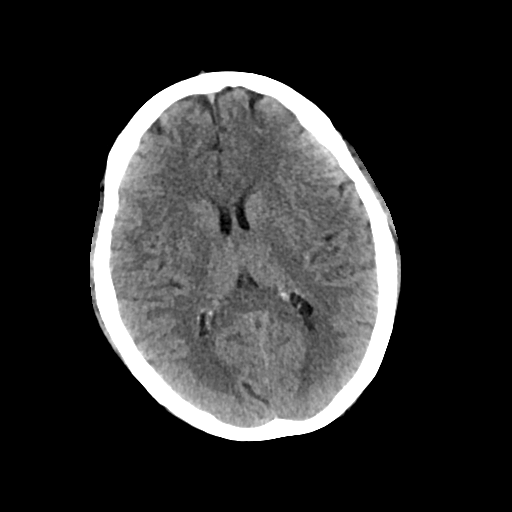
[im 20/32  brain]
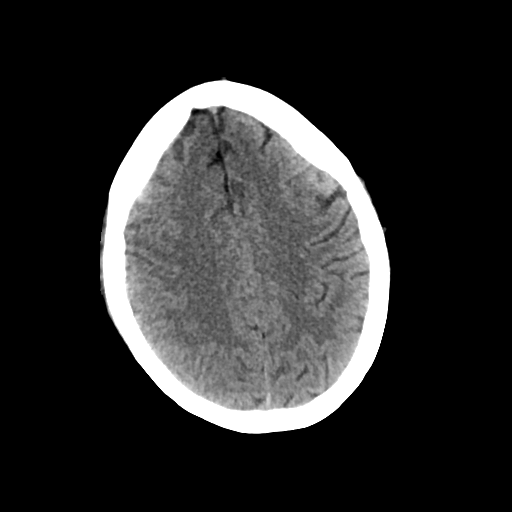
[im 20/32  bone]
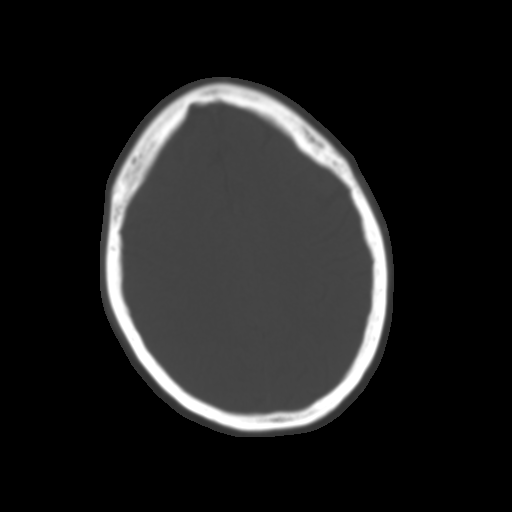
[im 24/32  brain]
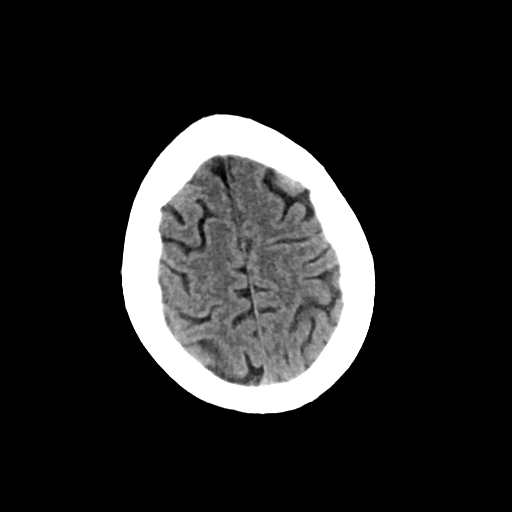
[im 28/32  brain]
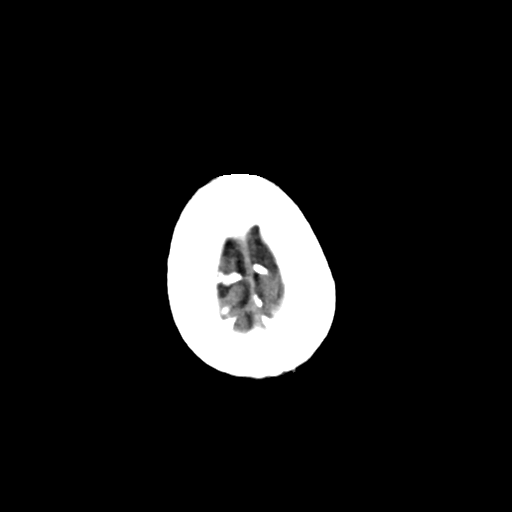

[Series 5: cor soft · coronal · 0.31mm/px · 3 of 67 slices shown]
[im 23/67  brain]
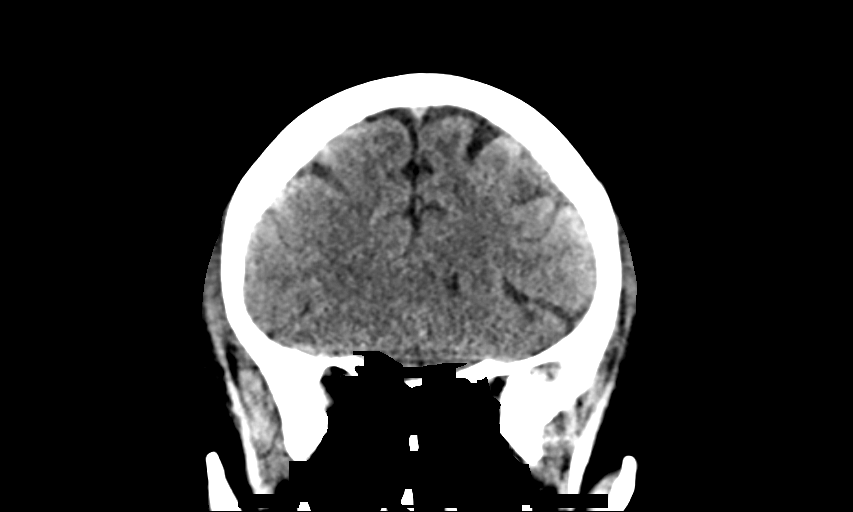
[im 30/67  brain]
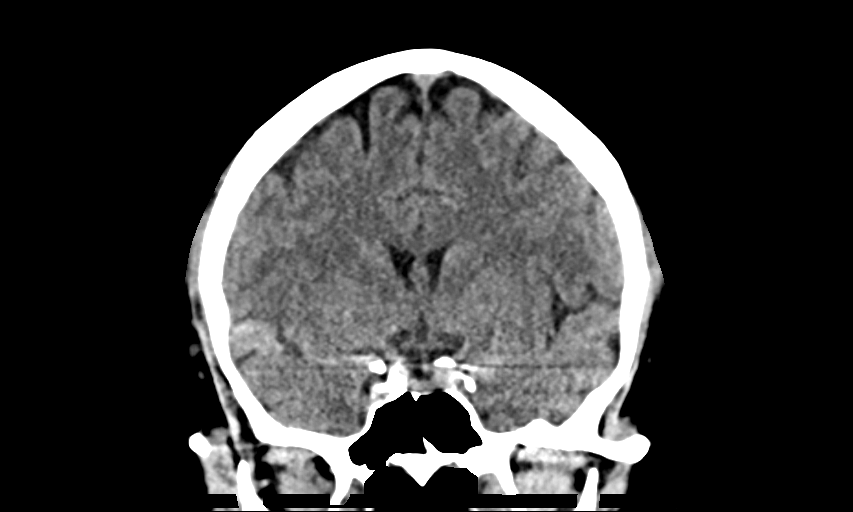
[im 37/67  brain]
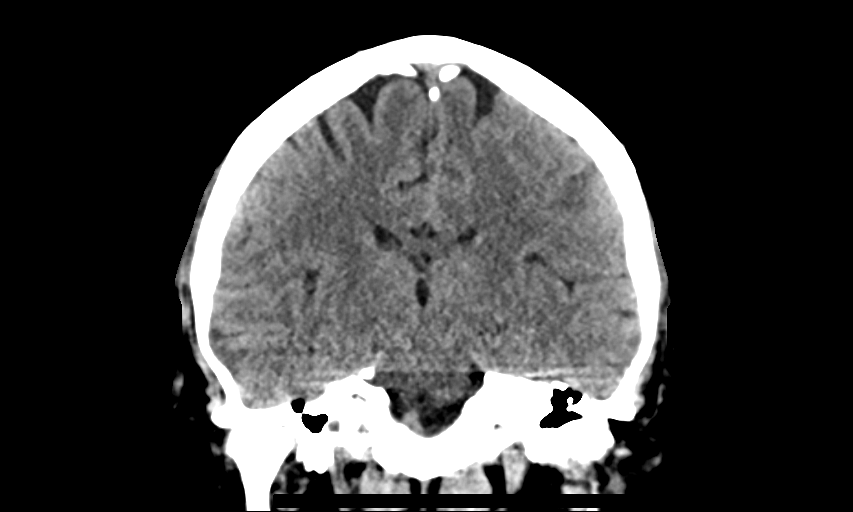

[Series 6: sag soft · sagittal · 0.31mm/px · 3 of 67 slices shown]
[im 23/67  brain]
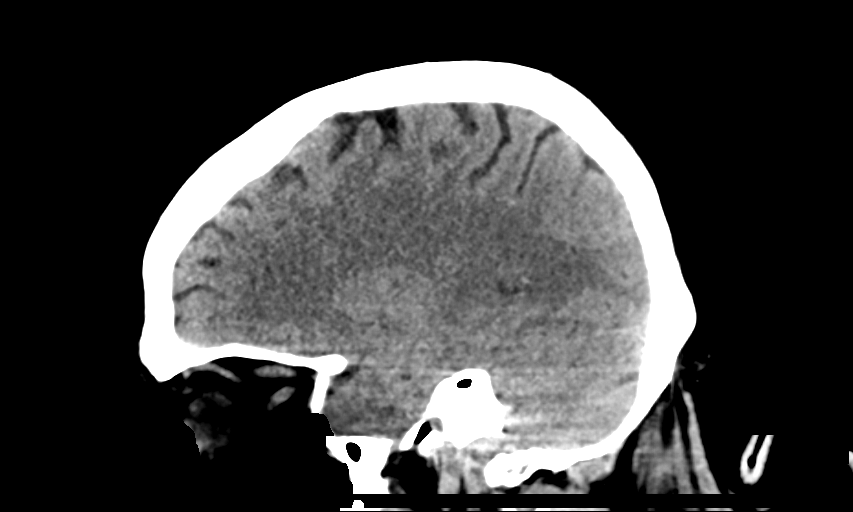
[im 34/67  brain]
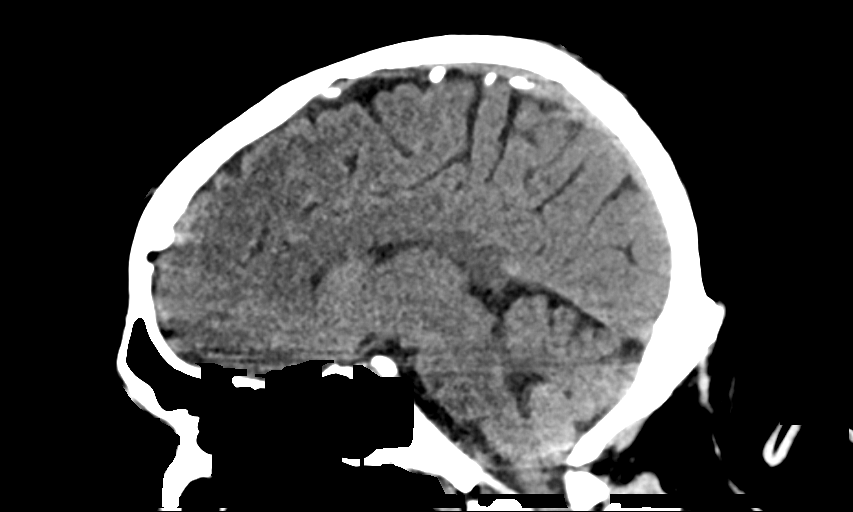
[im 45/67  brain]
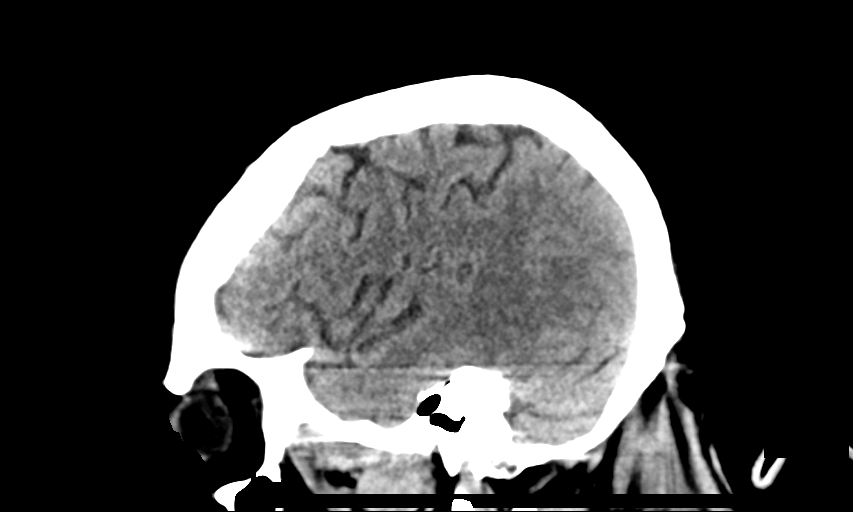

[13 of 47 positions shown; findings below may reference images not displayed]

FINDINGS: Brain: Cerebral volume is normal for age. No midline shift,
ventriculomegaly, mass effect, evidence of mass lesion, intracranial
hemorrhage or evidence of cortically based acute infarction.
Gray-white matter differentiation is within normal limits throughout
the brain.

Vascular: Calcified atherosclerosis at the skull base. No suspicious
intracranial vascular hyperdensity.

Skull: No acute osseous abnormality identified.

Sinuses/Orbits: Visualized paranasal sinuses and mastoids are stable
and well pneumatized.

Other: Decreased scalp soft tissue stranding and swelling
posteriorly near midline (series 4, image 52. Otherwise visible
orbits and scalp soft tissues are within normal limits.
IMPRESSION: 1. Stable and normal for age noncontrast CT appearance of the brain.
2. Mild regression of left posterior scalp contusion.

## 2019-04-24 IMAGING — DX DG CHEST 1V PORT
1 series · 1 of 1 positions shown · non-contrast
Comparison: Chest radiograph 12/17/2016

CLINICAL DATA: Dyspnea

EXAM:
PORTABLE CHEST 1 VIEW

[chest ap]
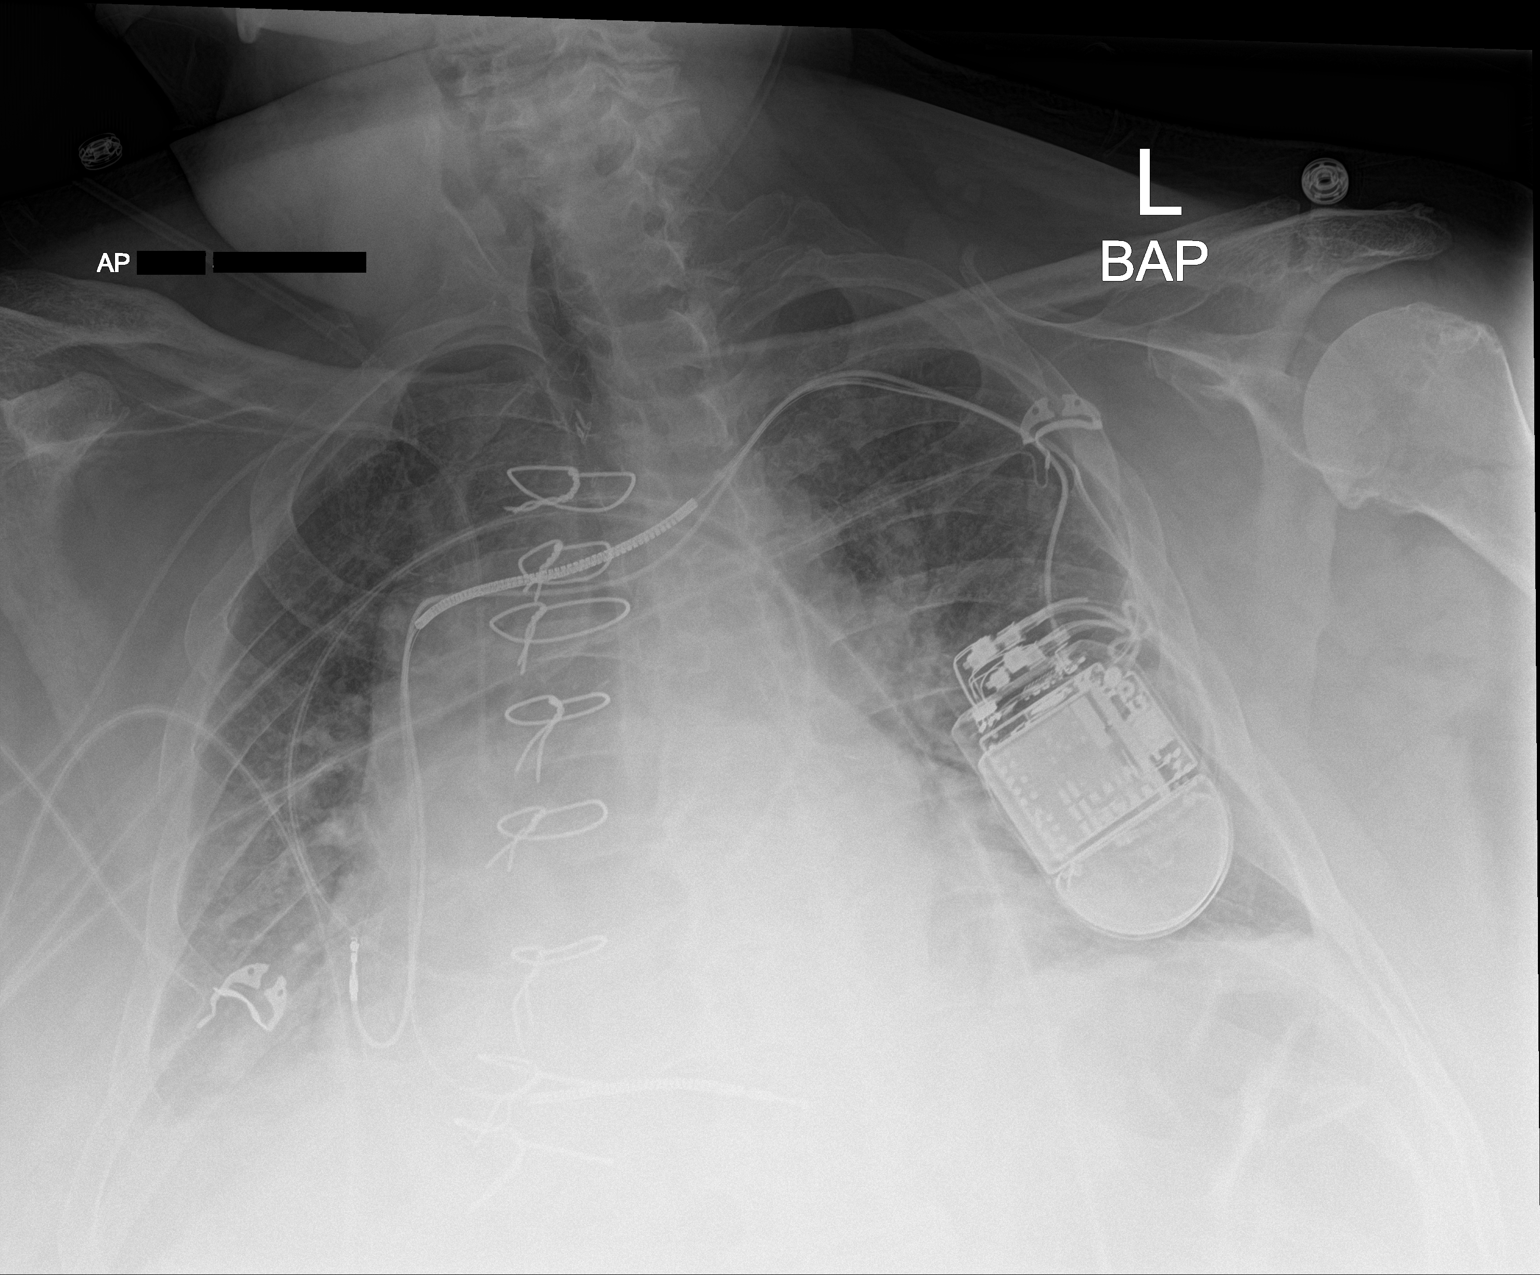

[1 of 1 positions shown; findings below may reference images not displayed]

FINDINGS: Left chest wall AICD leads are unchanged in position. Sequelae of
median sternotomy.

Unchanged cardiomegaly. Small right pleural effusion with associated
atelectasis, unchanged. No new region of consolidation. Unchanged
left basilar atelectasis. Persistent pulmonary vascular congestion.
IMPRESSION: Unchanged small right pleural effusion and associated atelectasis.
Pulmonary vascular congestion and left basilar atelectasis without
overt pulmonary edema.
